# Patient Record
Sex: Female | Born: 1960
Health system: Southern US, Community
[De-identification: ages and names within clinical notes are randomized; demographics above are authoritative.]

## PROBLEM LIST (undated history)

## (undated) DIAGNOSIS — I10 Essential (primary) hypertension: Secondary | ICD-10-CM

## (undated) DIAGNOSIS — E785 Hyperlipidemia, unspecified: Secondary | ICD-10-CM

## (undated) DIAGNOSIS — F329 Major depressive disorder, single episode, unspecified: Secondary | ICD-10-CM

## (undated) DIAGNOSIS — E559 Vitamin D deficiency, unspecified: Secondary | ICD-10-CM

## (undated) DIAGNOSIS — T7840XA Allergy, unspecified, initial encounter: Secondary | ICD-10-CM

## (undated) DIAGNOSIS — R600 Localized edema: Secondary | ICD-10-CM

## (undated) DIAGNOSIS — K219 Gastro-esophageal reflux disease without esophagitis: Secondary | ICD-10-CM

## (undated) DIAGNOSIS — E739 Lactose intolerance, unspecified: Secondary | ICD-10-CM

## (undated) DIAGNOSIS — E538 Deficiency of other specified B group vitamins: Secondary | ICD-10-CM

## (undated) DIAGNOSIS — M1990 Unspecified osteoarthritis, unspecified site: Secondary | ICD-10-CM

## (undated) DIAGNOSIS — E669 Obesity, unspecified: Secondary | ICD-10-CM

## (undated) DIAGNOSIS — M2550 Pain in unspecified joint: Secondary | ICD-10-CM

## (undated) DIAGNOSIS — F319 Bipolar disorder, unspecified: Secondary | ICD-10-CM

## (undated) DIAGNOSIS — F32A Depression, unspecified: Secondary | ICD-10-CM

## (undated) DIAGNOSIS — L732 Hidradenitis suppurativa: Secondary | ICD-10-CM

## (undated) DIAGNOSIS — F419 Anxiety disorder, unspecified: Secondary | ICD-10-CM

## (undated) DIAGNOSIS — R0602 Shortness of breath: Secondary | ICD-10-CM

## (undated) HISTORY — DX: Pain in unspecified joint: M25.50

## (undated) HISTORY — DX: Anxiety disorder, unspecified: F41.9

## (undated) HISTORY — DX: Major depressive disorder, single episode, unspecified: F32.9

## (undated) HISTORY — DX: Shortness of breath: R06.02

## (undated) HISTORY — DX: Lactose intolerance, unspecified: E73.9

## (undated) HISTORY — PX: BREAST BIOPSY: SHX20

## (undated) HISTORY — DX: Depression, unspecified: F32.A

## (undated) HISTORY — DX: Unspecified osteoarthritis, unspecified site: M19.90

## (undated) HISTORY — DX: Localized edema: R60.0

## (undated) HISTORY — DX: Obesity, unspecified: E66.9

## (undated) HISTORY — DX: Gastro-esophageal reflux disease without esophagitis: K21.9

## (undated) HISTORY — DX: Hyperlipidemia, unspecified: E78.5

## (undated) HISTORY — PX: WISDOM TOOTH EXTRACTION: SHX21

## (undated) HISTORY — DX: Allergy, unspecified, initial encounter: T78.40XA

## (undated) HISTORY — DX: Hidradenitis suppurativa: L73.2

## (undated) HISTORY — DX: Deficiency of other specified B group vitamins: E53.8

## (undated) HISTORY — PX: INDUCED ABORTION: SHX677

## (undated) HISTORY — DX: Vitamin D deficiency, unspecified: E55.9

---

## 1997-10-16 ENCOUNTER — Ambulatory Visit (HOSPITAL_COMMUNITY): Admission: RE | Admit: 1997-10-16 | Discharge: 1997-10-16 | Payer: Self-pay | Admitting: Family Medicine

## 1998-09-02 ENCOUNTER — Other Ambulatory Visit: Admission: RE | Admit: 1998-09-02 | Discharge: 1998-09-02 | Payer: Self-pay | Admitting: Family Medicine

## 2000-01-26 ENCOUNTER — Other Ambulatory Visit: Admission: RE | Admit: 2000-01-26 | Discharge: 2000-01-26 | Payer: Self-pay | Admitting: Obstetrics and Gynecology

## 2000-09-14 ENCOUNTER — Emergency Department (HOSPITAL_COMMUNITY): Admission: EM | Admit: 2000-09-14 | Discharge: 2000-09-14 | Payer: Self-pay | Admitting: Emergency Medicine

## 2000-09-23 ENCOUNTER — Emergency Department (HOSPITAL_COMMUNITY): Admission: EM | Admit: 2000-09-23 | Discharge: 2000-09-23 | Payer: Self-pay | Admitting: Emergency Medicine

## 2000-09-29 ENCOUNTER — Emergency Department (HOSPITAL_COMMUNITY): Admission: EM | Admit: 2000-09-29 | Discharge: 2000-09-29 | Payer: Self-pay | Admitting: Emergency Medicine

## 2002-08-26 ENCOUNTER — Other Ambulatory Visit: Admission: RE | Admit: 2002-08-26 | Discharge: 2002-08-26 | Payer: Self-pay | Admitting: Obstetrics & Gynecology

## 2003-01-27 ENCOUNTER — Encounter: Payer: Self-pay | Admitting: Obstetrics and Gynecology

## 2003-01-27 ENCOUNTER — Encounter: Admission: RE | Admit: 2003-01-27 | Discharge: 2003-01-27 | Payer: Self-pay | Admitting: Obstetrics and Gynecology

## 2003-02-11 ENCOUNTER — Encounter: Payer: Self-pay | Admitting: Obstetrics and Gynecology

## 2003-02-11 ENCOUNTER — Encounter: Admission: RE | Admit: 2003-02-11 | Discharge: 2003-02-11 | Payer: Self-pay | Admitting: Obstetrics and Gynecology

## 2004-06-24 ENCOUNTER — Emergency Department (HOSPITAL_COMMUNITY): Admission: EM | Admit: 2004-06-24 | Discharge: 2004-06-24 | Payer: Self-pay | Admitting: Emergency Medicine

## 2007-01-29 LAB — CONVERTED CEMR LAB

## 2007-02-28 LAB — CONVERTED CEMR LAB

## 2007-04-11 ENCOUNTER — Telehealth (INDEPENDENT_AMBULATORY_CARE_PROVIDER_SITE_OTHER): Payer: Self-pay | Admitting: *Deleted

## 2007-04-17 ENCOUNTER — Ambulatory Visit: Payer: Self-pay | Admitting: Nurse Practitioner

## 2007-04-17 DIAGNOSIS — I10 Essential (primary) hypertension: Secondary | ICD-10-CM | POA: Insufficient documentation

## 2007-04-17 DIAGNOSIS — E669 Obesity, unspecified: Secondary | ICD-10-CM | POA: Insufficient documentation

## 2007-04-17 DIAGNOSIS — F319 Bipolar disorder, unspecified: Secondary | ICD-10-CM | POA: Insufficient documentation

## 2007-04-17 LAB — CONVERTED CEMR LAB
ALT: 18 units/L (ref 0–35)
AST: 18 units/L (ref 0–37)
Albumin: 4.1 g/dL (ref 3.5–5.2)
Alkaline Phosphatase: 52 units/L (ref 39–117)
BUN: 11 mg/dL (ref 6–23)
Basophils Absolute: 0 10*3/uL (ref 0.0–0.1)
Basophils Relative: 0 % (ref 0–1)
Bilirubin Urine: NEGATIVE
Blood in Urine, dipstick: NEGATIVE
CO2: 27 meq/L (ref 19–32)
Calcium: 9.4 mg/dL (ref 8.4–10.5)
Carbamazepine Lvl: 5.5 ug/mL (ref 4.0–12.0)
Chloride: 103 meq/L (ref 96–112)
Cholesterol: 207 mg/dL — ABNORMAL HIGH (ref 0–200)
Creatinine, Ser: 0.83 mg/dL (ref 0.40–1.20)
Eosinophils Absolute: 0 10*3/uL — ABNORMAL LOW (ref 0.2–0.7)
Eosinophils Relative: 1 % (ref 0–5)
Glucose, Bld: 87 mg/dL (ref 70–99)
Glucose, Urine, Semiquant: NEGATIVE
HCT: 41.7 % (ref 36.0–46.0)
HDL: 71 mg/dL (ref >39)
Hemoglobin: 13.4 g/dL (ref 12.0–15.0)
Ketones, urine, test strip: NEGATIVE
LDL Cholesterol: 107 mg/dL — ABNORMAL HIGH (ref 0–99)
Lymphocytes Relative: 37 % (ref 12–46)
Lymphs Abs: 1.7 10*3/uL (ref 0.7–4.0)
MCHC: 32.1 g/dL (ref 30.0–36.0)
MCV: 95.9 fL (ref 78.0–100.0)
Monocytes Absolute: 0.6 10*3/uL (ref 0.1–1.0)
Monocytes Relative: 14 % — ABNORMAL HIGH (ref 3–12)
Neutro Abs: 2.2 10*3/uL (ref 1.7–7.7)
Neutrophils Relative %: 48 % (ref 43–77)
Nitrite: NEGATIVE
Platelets: 323 10*3/uL (ref 150–400)
Potassium: 4.4 meq/L (ref 3.5–5.3)
RBC: 4.35 M/uL (ref 3.87–5.11)
RDW: 13.4 % (ref 11.5–15.5)
Sodium: 141 meq/L (ref 135–145)
Specific Gravity, Urine: <1.005
TSH: 0.625 microintl units/mL (ref 0.350–5.50)
Total Bilirubin: 0.3 mg/dL (ref 0.3–1.2)
Total CHOL/HDL Ratio: 2.9
Total Protein: 7.7 g/dL (ref 6.0–8.3)
Triglycerides: 143 mg/dL (ref <150)
Urobilinogen, UA: 0.2
VLDL: 29 mg/dL (ref 0–40)
WBC Urine, dipstick: NEGATIVE
WBC: 4.5 10*3/uL (ref 4.0–10.5)
pH: 8

## 2007-04-18 ENCOUNTER — Encounter (INDEPENDENT_AMBULATORY_CARE_PROVIDER_SITE_OTHER): Payer: Self-pay | Admitting: Nurse Practitioner

## 2007-05-03 ENCOUNTER — Ambulatory Visit: Payer: Self-pay | Admitting: Internal Medicine

## 2007-05-16 ENCOUNTER — Ambulatory Visit (HOSPITAL_COMMUNITY): Admission: RE | Admit: 2007-05-16 | Discharge: 2007-05-16 | Payer: Self-pay | Admitting: Family Medicine

## 2007-05-21 ENCOUNTER — Ambulatory Visit: Payer: Self-pay | Admitting: Nurse Practitioner

## 2007-05-21 LAB — CONVERTED CEMR LAB: Carbamazepine Lvl: 5.4 ug/mL (ref 4.0–12.0)

## 2007-05-22 ENCOUNTER — Encounter (INDEPENDENT_AMBULATORY_CARE_PROVIDER_SITE_OTHER): Payer: Self-pay | Admitting: Nurse Practitioner

## 2007-06-07 ENCOUNTER — Encounter (INDEPENDENT_AMBULATORY_CARE_PROVIDER_SITE_OTHER): Payer: Self-pay | Admitting: Nurse Practitioner

## 2007-07-26 ENCOUNTER — Telehealth (INDEPENDENT_AMBULATORY_CARE_PROVIDER_SITE_OTHER): Payer: Self-pay | Admitting: Nurse Practitioner

## 2007-09-04 ENCOUNTER — Telehealth (INDEPENDENT_AMBULATORY_CARE_PROVIDER_SITE_OTHER): Payer: Self-pay | Admitting: Nurse Practitioner

## 2007-09-06 ENCOUNTER — Ambulatory Visit: Payer: Self-pay | Admitting: Internal Medicine

## 2007-09-06 ENCOUNTER — Ambulatory Visit: Payer: Self-pay | Admitting: *Deleted

## 2007-09-06 DIAGNOSIS — L732 Hidradenitis suppurativa: Secondary | ICD-10-CM | POA: Insufficient documentation

## 2007-09-21 ENCOUNTER — Telehealth (INDEPENDENT_AMBULATORY_CARE_PROVIDER_SITE_OTHER): Payer: Self-pay | Admitting: Nurse Practitioner

## 2007-09-24 ENCOUNTER — Ambulatory Visit: Payer: Self-pay | Admitting: Nurse Practitioner

## 2007-09-24 DIAGNOSIS — B379 Candidiasis, unspecified: Secondary | ICD-10-CM | POA: Insufficient documentation

## 2007-10-05 ENCOUNTER — Telehealth (INDEPENDENT_AMBULATORY_CARE_PROVIDER_SITE_OTHER): Payer: Self-pay | Admitting: Nurse Practitioner

## 2007-10-23 ENCOUNTER — Telehealth (INDEPENDENT_AMBULATORY_CARE_PROVIDER_SITE_OTHER): Payer: Self-pay | Admitting: Nurse Practitioner

## 2007-10-29 ENCOUNTER — Ambulatory Visit: Payer: Self-pay | Admitting: Nurse Practitioner

## 2007-11-12 ENCOUNTER — Telehealth (INDEPENDENT_AMBULATORY_CARE_PROVIDER_SITE_OTHER): Payer: Self-pay | Admitting: *Deleted

## 2007-12-19 ENCOUNTER — Ambulatory Visit: Payer: Self-pay | Admitting: Nurse Practitioner

## 2007-12-21 ENCOUNTER — Encounter (INDEPENDENT_AMBULATORY_CARE_PROVIDER_SITE_OTHER): Payer: Self-pay | Admitting: Nurse Practitioner

## 2007-12-21 LAB — CONVERTED CEMR LAB: Pap Smear: NEGATIVE

## 2007-12-28 ENCOUNTER — Encounter (INDEPENDENT_AMBULATORY_CARE_PROVIDER_SITE_OTHER): Payer: Self-pay | Admitting: Nurse Practitioner

## 2008-01-11 ENCOUNTER — Encounter (INDEPENDENT_AMBULATORY_CARE_PROVIDER_SITE_OTHER): Payer: Self-pay | Admitting: Nurse Practitioner

## 2008-01-29 ENCOUNTER — Ambulatory Visit: Payer: Self-pay | Admitting: Internal Medicine

## 2008-01-29 ENCOUNTER — Encounter (INDEPENDENT_AMBULATORY_CARE_PROVIDER_SITE_OTHER): Payer: Self-pay | Admitting: Nurse Practitioner

## 2008-02-11 ENCOUNTER — Encounter (INDEPENDENT_AMBULATORY_CARE_PROVIDER_SITE_OTHER): Payer: Self-pay | Admitting: Nurse Practitioner

## 2008-03-17 ENCOUNTER — Telehealth (INDEPENDENT_AMBULATORY_CARE_PROVIDER_SITE_OTHER): Payer: Self-pay | Admitting: Nurse Practitioner

## 2008-03-24 ENCOUNTER — Ambulatory Visit: Payer: Self-pay | Admitting: Nurse Practitioner

## 2008-03-27 ENCOUNTER — Ambulatory Visit: Payer: Self-pay | Admitting: Nurse Practitioner

## 2008-06-06 ENCOUNTER — Ambulatory Visit: Payer: Self-pay | Admitting: Nurse Practitioner

## 2008-06-20 ENCOUNTER — Ambulatory Visit: Payer: Self-pay | Admitting: Nurse Practitioner

## 2008-10-30 ENCOUNTER — Ambulatory Visit: Payer: Self-pay | Admitting: Nurse Practitioner

## 2008-10-30 DIAGNOSIS — N898 Other specified noninflammatory disorders of vagina: Secondary | ICD-10-CM | POA: Insufficient documentation

## 2008-10-30 LAB — CONVERTED CEMR LAB
Blood in Urine, dipstick: NEGATIVE
Glucose, Urine, Semiquant: NEGATIVE
KOH Prep: NEGATIVE
Nitrite: NEGATIVE
Protein, U semiquant: 30
Specific Gravity, Urine: >=1.03
Urobilinogen, UA: 0.2
WBC Urine, dipstick: NEGATIVE
pH: 5

## 2009-04-27 ENCOUNTER — Ambulatory Visit: Payer: Self-pay | Admitting: Nurse Practitioner

## 2009-04-27 LAB — CONVERTED CEMR LAB
Bilirubin Urine: NEGATIVE
Glucose, Urine, Semiquant: NEGATIVE
KOH Prep: NEGATIVE
Ketones, urine, test strip: NEGATIVE
Nitrite: NEGATIVE
OCCULT 1: NEGATIVE
Protein, U semiquant: NEGATIVE
Specific Gravity, Urine: 1.01
Urobilinogen, UA: NEGATIVE
WBC Urine, dipstick: NEGATIVE
pH: 6.5

## 2009-04-28 ENCOUNTER — Encounter (INDEPENDENT_AMBULATORY_CARE_PROVIDER_SITE_OTHER): Payer: Self-pay | Admitting: Nurse Practitioner

## 2009-04-28 DIAGNOSIS — E7800 Pure hypercholesterolemia, unspecified: Secondary | ICD-10-CM | POA: Insufficient documentation

## 2009-04-28 LAB — CONVERTED CEMR LAB
ALT: 16 units/L (ref 0–35)
AST: 16 units/L (ref 0–37)
Albumin: 4.4 g/dL (ref 3.5–5.2)
Alkaline Phosphatase: 54 units/L (ref 39–117)
BUN: 11 mg/dL (ref 6–23)
Basophils Absolute: 0 10*3/uL (ref 0.0–0.1)
Basophils Relative: 1 % (ref 0–1)
CO2: 24 meq/L (ref 19–32)
Calcium: 9.4 mg/dL (ref 8.4–10.5)
Chlamydia, DNA Probe: NEGATIVE
Chloride: 100 meq/L (ref 96–112)
Cholesterol: 222 mg/dL — ABNORMAL HIGH (ref 0–200)
Creatinine, Ser: 0.81 mg/dL (ref 0.40–1.20)
Eosinophils Absolute: 0.1 10*3/uL (ref 0.0–0.7)
Eosinophils Relative: 2 % (ref 0–5)
GC Probe Amp, Genital: NEGATIVE
Glucose, Bld: 78 mg/dL (ref 70–99)
HCT: 42.2 % (ref 36.0–46.0)
HDL: 69 mg/dL (ref >39)
Hemoglobin: 13.8 g/dL (ref 12.0–15.0)
LDL Cholesterol: 134 mg/dL — ABNORMAL HIGH (ref 0–99)
Lymphocytes Relative: 39 % (ref 12–46)
Lymphs Abs: 2.1 10*3/uL (ref 0.7–4.0)
MCHC: 32.7 g/dL (ref 30.0–36.0)
MCV: 94.8 fL (ref 78.0–100.0)
Microalb, Ur: 0.92 mg/dL (ref 0.00–1.89)
Monocytes Absolute: 0.7 10*3/uL (ref 0.1–1.0)
Monocytes Relative: 13 % — ABNORMAL HIGH (ref 3–12)
Neutro Abs: 2.4 10*3/uL (ref 1.7–7.7)
Neutrophils Relative %: 45 % (ref 43–77)
Platelets: 348 10*3/uL (ref 150–400)
Potassium: 4.8 meq/L (ref 3.5–5.3)
RBC: 4.45 M/uL (ref 3.87–5.11)
RDW: 13.3 % (ref 11.5–15.5)
Sodium: 137 meq/L (ref 135–145)
TSH: 0.583 microintl units/mL (ref 0.350–4.500)
Total Bilirubin: 0.3 mg/dL (ref 0.3–1.2)
Total CHOL/HDL Ratio: 3.2
Total Protein: 7.9 g/dL (ref 6.0–8.3)
Triglycerides: 94 mg/dL (ref <150)
VLDL: 19 mg/dL (ref 0–40)
WBC: 5.3 10*3/uL (ref 4.0–10.5)

## 2009-05-07 ENCOUNTER — Ambulatory Visit (HOSPITAL_COMMUNITY): Admission: RE | Admit: 2009-05-07 | Discharge: 2009-05-07 | Payer: Self-pay | Admitting: Internal Medicine

## 2009-05-07 ENCOUNTER — Encounter (INDEPENDENT_AMBULATORY_CARE_PROVIDER_SITE_OTHER): Payer: Self-pay | Admitting: Nurse Practitioner

## 2009-05-11 ENCOUNTER — Encounter (INDEPENDENT_AMBULATORY_CARE_PROVIDER_SITE_OTHER): Payer: Self-pay | Admitting: Nurse Practitioner

## 2009-05-18 ENCOUNTER — Encounter: Admission: RE | Admit: 2009-05-18 | Discharge: 2009-05-18 | Payer: Self-pay | Admitting: Internal Medicine

## 2009-05-18 ENCOUNTER — Encounter (INDEPENDENT_AMBULATORY_CARE_PROVIDER_SITE_OTHER): Payer: Self-pay | Admitting: Nurse Practitioner

## 2009-06-24 ENCOUNTER — Ambulatory Visit: Payer: Self-pay | Admitting: Nurse Practitioner

## 2009-06-24 DIAGNOSIS — R928 Other abnormal and inconclusive findings on diagnostic imaging of breast: Secondary | ICD-10-CM | POA: Insufficient documentation

## 2009-06-24 LAB — CONVERTED CEMR LAB
Cholesterol, target level: 200 mg/dL
HDL goal, serum: 40 mg/dL
LDL Goal: 160 mg/dL

## 2009-12-08 ENCOUNTER — Ambulatory Visit: Payer: Self-pay | Admitting: Nurse Practitioner

## 2009-12-10 ENCOUNTER — Ambulatory Visit: Payer: Self-pay | Admitting: Internal Medicine

## 2009-12-24 ENCOUNTER — Ambulatory Visit: Payer: Self-pay | Admitting: Nurse Practitioner

## 2009-12-24 DIAGNOSIS — R21 Rash and other nonspecific skin eruption: Secondary | ICD-10-CM | POA: Insufficient documentation

## 2009-12-25 ENCOUNTER — Encounter (INDEPENDENT_AMBULATORY_CARE_PROVIDER_SITE_OTHER): Payer: Self-pay | Admitting: Nurse Practitioner

## 2009-12-30 ENCOUNTER — Encounter (INDEPENDENT_AMBULATORY_CARE_PROVIDER_SITE_OTHER): Payer: Self-pay | Admitting: Nurse Practitioner

## 2010-01-04 LAB — CONVERTED CEMR LAB
Chlamydia, Swab/Urine, PCR: NEGATIVE
GC Probe Amp, Urine: NEGATIVE

## 2010-04-29 ENCOUNTER — Ambulatory Visit: Payer: Self-pay | Admitting: Nurse Practitioner

## 2010-04-29 DIAGNOSIS — M25569 Pain in unspecified knee: Secondary | ICD-10-CM | POA: Insufficient documentation

## 2010-04-29 LAB — CONVERTED CEMR LAB
ALT: 12 units/L (ref 0–35)
AST: 16 units/L (ref 0–37)
Albumin: 3.9 g/dL (ref 3.5–5.2)
Alkaline Phosphatase: 45 units/L (ref 39–117)
BUN: 11 mg/dL (ref 6–23)
Basophils Absolute: 0 10*3/uL (ref 0.0–0.1)
Basophils Relative: 1 % (ref 0–1)
CO2: 27 meq/L (ref 19–32)
Calcium: 9.3 mg/dL (ref 8.4–10.5)
Chloride: 103 meq/L (ref 96–112)
Cholesterol: 203 mg/dL — ABNORMAL HIGH (ref 0–200)
Creatinine, Ser: 0.72 mg/dL (ref 0.40–1.20)
Eosinophils Absolute: 0.1 10*3/uL (ref 0.0–0.7)
Eosinophils Relative: 2 % (ref 0–5)
Glucose, Bld: 81 mg/dL (ref 70–99)
HCT: 38 % (ref 36.0–46.0)
HDL: 65 mg/dL (ref >39)
Hemoglobin: 12.7 g/dL (ref 12.0–15.0)
LDL Cholesterol: 125 mg/dL — ABNORMAL HIGH (ref 0–99)
Lymphocytes Relative: 46 % (ref 12–46)
Lymphs Abs: 2 10*3/uL (ref 0.7–4.0)
MCHC: 33.4 g/dL (ref 30.0–36.0)
MCV: 92.9 fL (ref 78.0–100.0)
Monocytes Absolute: 0.5 10*3/uL (ref 0.1–1.0)
Monocytes Relative: 12 % (ref 3–12)
Neutro Abs: 1.7 10*3/uL (ref 1.7–7.7)
Neutrophils Relative %: 40 % — ABNORMAL LOW (ref 43–77)
Platelets: 319 10*3/uL (ref 150–400)
Potassium: 4.7 meq/L (ref 3.5–5.3)
RBC: 4.09 M/uL (ref 3.87–5.11)
RDW: 13 % (ref 11.5–15.5)
Rapid HIV Screen: NEGATIVE
Sodium: 138 meq/L (ref 135–145)
TSH: 0.761 microintl units/mL (ref 0.350–4.500)
Total Bilirubin: 0.4 mg/dL (ref 0.3–1.2)
Total CHOL/HDL Ratio: 3.1
Total Protein: 7.5 g/dL (ref 6.0–8.3)
Triglycerides: 65 mg/dL (ref <150)
VLDL: 13 mg/dL (ref 0–40)
WBC: 4.3 10*3/uL (ref 4.0–10.5)

## 2010-04-30 ENCOUNTER — Encounter (INDEPENDENT_AMBULATORY_CARE_PROVIDER_SITE_OTHER): Payer: Self-pay | Admitting: Nurse Practitioner

## 2010-05-03 ENCOUNTER — Encounter (INDEPENDENT_AMBULATORY_CARE_PROVIDER_SITE_OTHER): Payer: Self-pay | Admitting: Nurse Practitioner

## 2010-05-07 ENCOUNTER — Encounter (INDEPENDENT_AMBULATORY_CARE_PROVIDER_SITE_OTHER): Payer: Self-pay | Admitting: Nurse Practitioner

## 2010-05-19 ENCOUNTER — Ambulatory Visit (HOSPITAL_COMMUNITY)
Admission: RE | Admit: 2010-05-19 | Discharge: 2010-05-19 | Payer: Self-pay | Source: Home / Self Care | Attending: Internal Medicine | Admitting: Internal Medicine

## 2010-05-27 ENCOUNTER — Encounter (INDEPENDENT_AMBULATORY_CARE_PROVIDER_SITE_OTHER): Payer: Self-pay | Admitting: Nurse Practitioner

## 2010-05-30 HISTORY — PX: BREAST BIOPSY: SHX20

## 2010-06-10 ENCOUNTER — Encounter
Admission: RE | Admit: 2010-06-10 | Discharge: 2010-06-10 | Payer: Self-pay | Source: Home / Self Care | Attending: Internal Medicine | Admitting: Internal Medicine

## 2010-06-17 ENCOUNTER — Encounter (INDEPENDENT_AMBULATORY_CARE_PROVIDER_SITE_OTHER): Payer: Self-pay | Admitting: Nurse Practitioner

## 2010-06-17 ENCOUNTER — Other Ambulatory Visit: Payer: Self-pay | Admitting: Diagnostic Radiology

## 2010-06-17 ENCOUNTER — Encounter
Admission: RE | Admit: 2010-06-17 | Discharge: 2010-06-17 | Payer: Self-pay | Source: Home / Self Care | Attending: Internal Medicine | Admitting: Internal Medicine

## 2010-06-24 ENCOUNTER — Ambulatory Visit
Admission: RE | Admit: 2010-06-24 | Discharge: 2010-06-24 | Payer: Self-pay | Source: Home / Self Care | Attending: Nurse Practitioner | Admitting: Nurse Practitioner

## 2010-06-24 ENCOUNTER — Encounter (INDEPENDENT_AMBULATORY_CARE_PROVIDER_SITE_OTHER): Payer: Self-pay | Admitting: Nurse Practitioner

## 2010-06-24 ENCOUNTER — Other Ambulatory Visit: Payer: Self-pay | Admitting: Nurse Practitioner

## 2010-06-24 LAB — CYTOLOGY - PAP

## 2010-06-24 LAB — CONVERTED CEMR LAB
Bilirubin Urine: NEGATIVE
Glucose, Urine, Semiquant: NEGATIVE
Ketones, urine, test strip: NEGATIVE
Nitrite: NEGATIVE
OCCULT 1: NEGATIVE
Protein, U semiquant: NEGATIVE
Specific Gravity, Urine: >=1.03
Urobilinogen, UA: 0.2
WBC Urine, dipstick: NEGATIVE
pH: 5

## 2010-06-27 LAB — CONVERTED CEMR LAB
Bilirubin Urine: NEGATIVE
Blood in Urine, dipstick: NEGATIVE
Chlamydia, DNA Probe: NEGATIVE
GC Probe Amp, Genital: NEGATIVE
Glucose, Urine, Semiquant: NEGATIVE
KOH Prep: NEGATIVE
Ketones, urine, test strip: NEGATIVE
Microalb, Ur: 1.01 mg/dL (ref 0.00–1.89)
Nitrite: NEGATIVE
Protein, U semiquant: NEGATIVE
Specific Gravity, Urine: >=1.03
Urobilinogen, UA: 0.2
WBC Urine, dipstick: NEGATIVE
pH: 5

## 2010-06-28 ENCOUNTER — Telehealth (INDEPENDENT_AMBULATORY_CARE_PROVIDER_SITE_OTHER): Payer: Self-pay | Admitting: Nurse Practitioner

## 2010-06-29 NOTE — Progress Notes (Signed)
Summary: Office Visit/EMR DOWN/ VISIT SCANNED  Office Visit/EMR DOWN/ VISIT SCANNED   Imported By: Arta Bruce 09/11/2007 10:43:19  _____________________________________________________________________  External Attachment:    Type:   Image     Comment:   External Document

## 2010-06-29 NOTE — Letter (Signed)
Summary: TEST ORDER FORM//MAMMOGRAM//APPT DATE & TIME  TEST ORDER FORM//MAMMOGRAM//APPT DATE & TIME   Imported By: Arta Bruce 06/17/2009 15:37:06  _____________________________________________________________________  External Attachment:    Type:   Image     Comment:   External Document

## 2010-06-29 NOTE — Letter (Signed)
Summary: *HSN Results Follow up  [facility]  [address] [place], [place] [address]   Phone: [phone]  Fax: [phone]      05/22/2007   SATYA BOHALL [address] [place], [place]  [address]   Dear  Ms. Lowell Bouton,                            ____S.Drinkard,FNP   ____D. Gore,FNP       ____B. McPherson,MD   ____V. Rankins,MD    ____E. Mulberry,MD    _X__N. Daphine Deutscher, FNP  ____D. Reche Dixon, MD    ____K. Philipp Deputy, MD    ____Other     This letter is to inform you that your recent test(s):  _______Pap Smear    ___X____Lab Test     _______X-ray    __X_____ is within acceptable limits  _______ requires a medication change  _______ requires a follow-up lab visit  _______ requires a follow-up visit with your provider   Comments: Tegretol level is ok.  Continue your medications.       _________________________________________________________ If you have any questions, please contact our office                     Sincerely,  Lehman Prom FNP [facility]

## 2010-06-29 NOTE — Progress Notes (Signed)
Summary: NEEDS REFILLS  Phone Note Call from Patient Call back at Home Phone [phone]   Reason for Call: Refill Medication Summary of Call: Stacie Daugherty PT. WENT TO [facility] LAST WEEK TO TRY AND GET A REFILL ON HER MINOCYCLINE FOR THE BOILS UNDER HER ARM AND THEY TOLD HER TO CALL HERE, PLUS SHE IS NEEDING THE BACTRIM REFIILED. Initial call taken by: Leodis Rains,  Oct 23, 2007 4:31 PM  Follow-up for Phone Call        forwarded to [facility] Follow-up by: Levon Hedger,  Oct 25, 2007 12:52 PM  Additional Follow-up for Phone Call Additional follow up Details #1::        There are not refills available.  Pt stated that minocycline was not working.  Therefore she was changed to bactrim which was for a 10 day course.  If problem still persists then she needs re-evaluation. She needs to use antibacterial soap and she can also get cleanser such as betasept that is OTC at the pharmacy to prevent buildup of bacteria Additional Follow-up by: Lehman Prom FNP,  Oct 25, 2007 1:33 PM    Additional Follow-up for Phone Call Additional follow up Details #2::    left message on machine for pt to return call to the office. Follow-up by: Levon Hedger,  Oct 26, 2007 12:45 PM  Additional Follow-up for Phone Call Additional follow up Details #3:: Details for Additional Follow-up Action Taken: Another request from [facility] pharmacy on the 29th for Minocycline--please notify pharmacy on Monday of [name]'s recommendations.    Levon Hedger  October 29, 2007 9:24 AM Pt is in for an office visit today.  Phone note complete. Additional Follow-up by: Julieanne Manson MD,  Oct 28, 2007 3:05 PM

## 2010-06-29 NOTE — Progress Notes (Signed)
Summary: FORM FROM SCHOOL DROPPED OFF  Phone Note Call from Patient Call back at [facility] Phone [phone]   Summary of Call: [employer] PT. 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 Initial call taken by: Leodis Rains,  March 17, 2008 4:40 PM  Follow-up for Phone Call        I have reviewed form... she needs  nurse visit for hearing, vision and PPD. Her tetanus is up to date but has she ever recieved HEP B? Follow-up by: Lehman Prom FNP,  March 17, 2008 5:36 PM  Additional Follow-up for Phone Call Additional follow up Details #1::        Patient came in this morning and scheduled an appt.   Additional Follow-up by: Leodis Rains,  March 24, 2008 8:16 AM

## 2010-06-29 NOTE — Letter (Signed)
Summary: AMANDA/NO SHOWED  AMANDA/NO SHOWED   Imported By: Arta Bruce 08/06/2008 11:34:40  _____________________________________________________________________  External Attachment:    Type:   Image     Comment:   External Document

## 2010-06-29 NOTE — Progress Notes (Signed)
Summary: Diazide refill  Phone Note From Pharmacy   Caller: [facility] [address] Summary of Call: Refill Diazide capsules last filled 08/22/07  [facility] [phone] Initial call taken by: Vesta Mixer CMA,  September 21, 2007 3:05 PM  Follow-up for Phone Call        done. Follow-up by: Lehman Prom FNP,  September 21, 2007 5:00 PM      Prescriptions: TRIAMTERENE-HCTZ 37.5-25 MG  CAPS (TRIAMTERENE-HCTZ) 1 tablet by mouth daily for blood pressure  #30 x 3   Entered and Authorized by:   Lehman Prom FNP   Signed by:   Lehman Prom FNP on 09/21/2007   Method used:   Electronically sent to ...       [facility]       [address]       [place], [place]  [address]       Ph: [phone]       Fax: [phone]   RxID:   [id number]

## 2010-06-29 NOTE — Progress Notes (Signed)
Summary: med request  Phone Note Call from Patient   Caller: Patient Reason for Call: Refill Medication Summary of Call: Pt is requesting MINOCYCLINE 100 MG Twice X Day  . [facility])   Please, call her back   [phone] Initial call taken by: Cheryll Dessert,  September 04, 2007 3:42 PM  Follow-up for Phone Call        Why does she need this med? this is an antibiotic and she has never been prescribed from this office.   If she feels she needs to be treated for infection then she needs to come to the office Follow-up by: Lehman Prom FNP,  September 05, 2007 8:13 AM  Additional Follow-up for Phone Call Additional follow up Details #1::        Left message on machine for pt to return call to the office Additional Follow-up by: Levon Hedger,  September 06, 2007 2:55 PM    Additional Follow-up for Phone Call Additional follow up Details #2::    Spoke with pt she states she came in last week and received a pill for the infection and she also states she has an appt on next week.  Phone call complete Follow-up by: Levon Hedger,  September 12, 2007 8:54 AM  \

## 2010-06-29 NOTE — Progress Notes (Signed)
Summary: Office Visit//DEPRESSION SCREENING  Office Visit//DEPRESSION SCREENING   Imported By: Arta Bruce 04/29/2010 12:28:31  _____________________________________________________________________  External Attachment:    Type:   Image     Comment:   External Document

## 2010-06-29 NOTE — Assessment & Plan Note (Signed)
Summary: NEW - HTN/Bipolar   Vital Signs:  Patient Profile:   50 Years Old Female Weight:      235 pounds Temp:     97.3 degrees F oral Pulse rate:   80 / minute Pulse rhythm:   regular Resp:     20 per minute BP sitting:   130 / 90  (right arm)  Pt. in pain?   no  Vitals Entered By: Mikey College CMA (April 17, 2007 8:44 AM)  Menstrual History: LMP - Character: 04/15/07              Is Patient Diabetic? No  Does patient need assistance? Functional Status Self care Ambulation Normal Comments Pt states [facility]. [facility] (need refills)     Chief Complaint:  New pt/establish care//pt states would like referral to see Marchelle Folks the counselor..  History of Present Illness: Pt into the office to establish care. Pt was previously seen at [facility].  CPE done within last 2 months at [facility] clinic.  She notes that not enough cells were collected during her PAP and she was to repeat in 6 months. Mammogram scheduled in 12/08.  she recieved a scholarship from radiology.  htn - pt is taking blood pressure meds daily.  She does not monitor the sodium in her diet.  She has some questions about her "fluid" pills and sodium use.  no exercise.  No glasses or contacts.  no recent optho visit.  Last dental exam 6 months ago.  Bipolar - Pt notes that she has been on the tegretol for years.  She is requesting a refill on her meds.  She would like to go talk to Aquilla Solian at the [facility] site for further direction on whether she needs to go to the [facility].  Pt notes that her condition is stable.        Current Allergies: No known allergies   Past Medical History:    Current Problems:     OBESITY (ICD-278.00)    BIPOLAR DISORDER UNSPECIFIED (ICD-296.80)    HYPERTENSION (ICD-401.9)      Past Surgical History:    Tonsillectomy   Family History:    Family History Depression - mother       Social History:    Married    children - 1    Never Smoked    Alcohol use-no    Drug use-no   Risk Factors:  Tobacco use:  never Drug use:  no Alcohol use:  no  PAP Smear History:     Date of Last PAP Smear:  01/29/2007    Results:  need repeat in 6 months; [facility] hospital    Review of Systems  General      Denies chills, fatigue, and fever.  Eyes      Denies blurring, discharge, double vision, eye irritation, eye pain, halos, itching, light sensitivity, red eye, vision loss-1 eye, and vision loss-both eyes.  ENT      Denies decreased hearing, difficulty swallowing, ear discharge, earache, hoarseness, nasal congestion, nosebleeds, postnasal drainage, ringing in ears, sinus pressure, and sore throat.  CV      Denies chest pain or discomfort, fatigue, and shortness of breath with exertion.  Resp      Denies chest discomfort, cough, and shortness of breath.  GI      Denies abdominal pain, diarrhea, nausea, and vomiting.  GU      Denies abnormal vaginal bleeding, decreased  libido, discharge, dysuria, genital sores, hematuria, incontinence, nocturia, urinary frequency, and urinary hesitancy.  MS      Denies joint pain, joint redness, joint swelling, loss of strength, low back pain, mid back pain, muscle aches, muscle , cramps, muscle weakness, stiffness, and thoracic pain.  Derm      Denies changes in color of skin, changes in nail beds, dryness, excessive perspiration, flushing, hair loss, insect bite(s), itching, lesion(s), poor wound healing, and rash.  Neuro      Denies brief paralysis, difficulty with concentration, disturbances in coordination, falling down, headaches, inability to speak, memory loss, numbness, poor balance, seizures, sensation of room spinning, tingling, tremors, visual disturbances, and weakness.  Psych      Denies alternate hallucination ( auditory/visual), anxiety, depression, easily angered, easily tearful, irritability, mental  problems, panic attacks, sense of great danger, suicidal thoughts/plans, thoughts of violence, unusual visions or sounds, and thoughts /plans of harming others.  Endo      Denies cold intolerance, excessive hunger, excessive thirst, excessive urination, heat intolerance, polyuria, and weight change.  Heme      Denies abnormal bruising, bleeding, enlarge lymph nodes, fevers, pallor, and skin discoloration.  Allergy      Denies hives or rash, itching eyes, persistent infections, seasonal allergies, and sneezing.   Physical Exam  General:      alert and overweight-appearing.   Head:     normocephalic.  head wrap in place Eyes:     exophthalmoses (mild) Ears:     R ear normal and L ear normal.   Nose:     no external deformity.   Mouth:     fair dentition.   Neck:     supple.   Lungs:     Normal respiratory effort, chest expands symmetrically. Lungs are clear to auscultation, no crackles or wheezes. Heart:     Normal rate and regular rhythm. S1 and S2 normal without gallop, murmur, click, rub or other extra sounds. Abdomen:     soft, non-tender, and normal bowel sounds.   Msk:     up to exam table no limits Extremities:     trace left pedal edema and trace right pedal edema.   Neurologic:     alert & oriented X3.   Skin:     color normal.   Psych:     Oriented X3 and good eye contact.      Impression & Recommendations:  Problem # 1:  HYPERTENSION (ICD-401.9) Monitor sodium in diet. Her updated medication list for this problem includes:    Triamterene-hctz 37.5-25 Mg Caps (Triamterene-hctz) .Marland Kitchen... 1 tablet by mouth daily for blood pressure  Orders: T-General Health Panel (CBCD, CMP, TSH) ([id number]) T-Lipid Profile ([id number]) UA Dipstick w/o Micro (82956)   Problem # 2:  BIPOLAR DISORDER UNSPECIFIED (ICD-296.80) will schedule pt an appt to see Aquilla Solian at the other site. If pt is stable may be able to renew meds here at this site. Orders:  Psychology Referral (Psychology) T-General Health Panel (CBCD, CMP, TSH) ([id number]) T-Lipid Profile [id number]) T-Tegretol (Carbamazepine) [phone])   Complete Medication List: 1)  Tegretol 200 Mg Tabs (Carbamazepine) .Marland Kitchen.. 1 tablet by mouth at night 2)  Triamterene-hctz 37.5-25 Mg Caps (Triamterene-hctz) .Marland Kitchen.. 1 tablet by mouth daily for blood pressure   Patient Instructions: 1)  Your labs will be drawn today 2)  You will get an appointment to see Aquilla Solian at the [facility]    Prescriptions: TRIAMTERENE-HCTZ 37.5-25 MG  CAPS (TRIAMTERENE-HCTZ) 1  tablet by mouth daily for blood pressure  #30 x 3   Entered and Authorized by:   Lehman Prom FNP   Signed by:   Lehman Prom FNP on 04/17/2007   Method used:   Print then Give to Patient   RxID:   [id number] TEGRETOL 200 MG  TABS (CARBAMAZEPINE) 1 tablet by mouth at night  #30 x 3   Entered and Authorized by:   Lehman Prom FNP   Signed by:   Lehman Prom FNP on 04/17/2007   Method used:   Print then Give to Patient   RxID:   [id number]  ] Laboratory Results   Urine Tests  Date/Time Received: April 17, 2007 10:20 AM Date/Time Reported: April 17, 2007 10:21 AM  Routine Urinalysis   Color: yellow Appearance: Clear Glucose: negative   (Normal Range: Negative) Bilirubin: negative   (Normal Range: Negative) Ketone: negative   (Normal Range: Negative) Spec. Gravity: <1.005   (Normal Range: 1.003-1.035) Blood: negative   (Normal Range: Negative) pH: 8.0   (Normal Range: 5.0-8.0) Protein: trace   (Normal Range: Negative) Urobilinogen: 0.2   (Normal Range: 0-1) Nitrite: negative   (Normal Range: Negative) Leukocyte Esterace: negative   (Normal Range: Negative)

## 2010-06-29 NOTE — Assessment & Plan Note (Signed)
Summary: Complete Physical Exam   Vital Signs:  Patient profile:   50 year old female Menstrual status:  regular LMP:     04/18/2009 Height:      63.0 inches Weight:      241 pounds BMI:     42.85 BSA:     2.10 Temp:     98.1 degrees F oral Pulse rate:   84 / minute Pulse rhythm:   regular Resp:     20 per minute BP sitting:   144 / 85  (right arm) Cuff size:   large  Vitals Entered By: Arthor Captain 04/27/2009  Nutrition Counseling: Patient's BMI is greater than 25 and therefore counseled on weight management options. CC: CPP, Hypertension Management Is Patient Diabetic? No Pain Assessment Patient in pain? no       Does patient need assistance? Functional Status Self care Ambulation Normal LMP (date): 04/18/2009 LMP - Character: normal     Menstrual flow (days): 4 Menstrual Status regular Enter LMP: 04/18/2009 Last PAP Result negative   CC:  CPP and Hypertension Management.  History of Present Illness:  Pt into the office for a complete physical exam  PAP - Normal PAP smears in the past.  Regular menses monthly. 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  Optho - last done 2-3 years ago. no current glasses by optho but she has lately noticed that she is requiring reading glasses  Dental - last appt was 9 months ago.  Hypertension History:      She denies headache, chest pain, and dyspnea with exertion.  She notes no problems with any antihypertensive medication side effects.  Pt is forgetting some of her medication dosages.        Positive major cardiovascular risk factors include hypertension.  Negative major cardiovascular risk factors include female age less than 42 years old and non-tobacco-user status.        Further assessment for target organ damage reveals no history of ASHD, cardiac end-organ damage  (CHF/LVH), stroke/TIA, peripheral vascular disease, renal insufficiency, or hypertensive retinopathy.     Habits & Providers  Alcohol-Tobacco-Diet     Alcohol drinks/day: 0     Tobacco Status: never  Exercise-Depression-Behavior     Does Patient Exercise: no     Have you felt down or hopeless? no     Have you felt little pleasure in things? no     Drug Use: no     Seat Belt Use: 100     Sun Exposure: occasionally  Comments: Pt is going to the [facility]. PHQ-9 score = 9  Allergies (verified): No Known Drug Allergies  Social History: Does Patient Exercise:  no  Review of Systems General:  Denies fever. Eyes:  Denies discharge. ENT:  Denies earache. CV:  Denies chest pain or discomfort. Resp:  Denies cough. GI:  Denies abdominal pain, nausea, and vomiting. GU:  Denies discharge. MS:  Denies low back pain. Derm:  Complains of lesion(s); denies rash; chronic under bilateral arms. Neuro:  Denies headaches. Psych:  Denies depression. Endo:  Denies excessive urination.  Physical Exam  General:  alert.   Head:  normocephalic.   Eyes:  pupils equal and pupils round.   Ears:  ear piercing(s) noted.  bil TM with clear fluid bilaterally Nose:  no nasal discharge.   Mouth:  pharynx pink and moist and fair dentition.   Neck:  supple.   Chest Wall:  no mass.   Breasts:  right breast - lump noted at 6 o'clock left breast - dense tissue in left breast but no masses Lungs:  normal breath sounds.   Heart:  normal rate and regular rhythm.   Abdomen:  soft, non-tender, and normal bowel sounds.   Rectal:  no hemorrhoids.   Msk:  up to the exam table Extremities:  no edema Neurologic:  alert & oriented X3, cranial nerves II-XII intact, and gait normal.   Skin:  bil underarms R>L - hyperpigmented areas right with some palpable papules but no open areas  abd - crusted papule Psych:  Oriented X3.    Pelvic Exam  Vulva:      normal appearance.   Urethra and Bladder:       Urethra--no discharge.   Vagina:      physiologic discharge.   Cervix:      midposition, parous.   Adnexa:      nontender bilaterally.   Rectum:      normal, heme negative stool.      Impression & Recommendations:  Problem # 1:  ROUTINE GYNECOLOGICAL EXAMINATION (ICD-V72.31) labs done  PAP done  guaiac negative rec optho and dental exam PHQ-9 score = 9 Orders: KOH/ WET Mount [phone]) Pap Smear, Thin Prep ( Collection of) [phone]) T-Lipid Profile [id number]) T-Comprehensive Metabolic Panel [id number]) T-CBC w/Diff ([id number]) T-TSH ([id number]) T-Urine Microalbumin w/creat. ratio [id number]) Hemoccult Guaiac-1 spec.(in office) (82270) T- GC Chlamydia (37169)  Problem # 2:  UNSPECIFIED BREAST SCREENING (ICD-V76.10) self breast exam recommended right breast lump noted mammogram scheduled Orders: Mammogram (Screening) (Mammo)  Problem # 3:  HYPERTENSION (ICD-401.9) BP is still elevated. Will need to start lisinopril and continue other meds DASH diet Her updated medication list for this problem includes:    Triamterene-hctz 37.5-25 Mg Caps (Triamterene-hctz) .Marland Kitchen... Take one (1) by mouth daily    Lisinopril 5 Mg Tabs (Lisinopril) ..... One tablet by mouth daily for blood pressure  Orders: EKG w/ Interpretation (93000) UA Dipstick w/o Micro (manual) (67893)  Problem # 4:  OBESITY (ICD-278.00) need to increase exercise and portion control attributes weight gain to zypreza  Problem # 5:  HIDRADENITIS SUPPURATIVA (ICD-705.83) reviewed DX with pt will continue on prophylactic antibiotics for now but advised pt that this is not ideal  Problem # 6:  BIPOLAR DISORDER UNSPECIFIED (ICD-296.80) continue f/u with mental health  Complete Medication List: 1)  Tegretol 200 Mg Tabs (Carbamazepine) .Marland Kitchen.. 1 tablet by mouth two times a day 2)  Triamterene-hctz 37.5-25 Mg Caps (Triamterene-hctz) .... Take one (1) by mouth daily 3)  Minocycline Hcl 50 Mg Caps  (Minocycline hcl) .Marland Kitchen.. 1 tablet by mouth two times a day 4)  Cleocin-t 1 % Lotn (Clindamycin phosphate) .... Apply two times a day to affectd areas 5)  Zyprexa 10 Mg Tabs (Olanzapine) .... Take one tablet by mouth daily **rx by [facility]** 6)  Zyprexa 15 Mg Tabs (Olanzapine) .... One tablet by mouth at bedtime **rx by [facility]** 7)  Lisinopril 5 Mg Tabs (Lisinopril) .... One tablet by mouth daily for blood pressure  Hypertension Assessment/Plan:      The patient's hypertensive risk group is category A: No risk factors and no target organ damage.  Her calculated 10 year risk of coronary heart disease is 4 %.  Today's blood pressure is 144/85.  Her blood pressure goal is < 140/90.  Patient  Instructions: 1)  Your blood pressure is still slightly elevated than normal today.  This is not the first time. 2)  Continue current medications and based on labs you may need additional medications. 3)  Keep your appointment for mammogram 4)  If you decide to take the flu vaccine then inform this office 5)  Follow up as needed  Prescriptions: LISINOPRIL 5 MG TABS (LISINOPRIL) One tablet by mouth daily for blood pressure  #30 x 5   Entered and Authorized by:   Lehman Prom FNP   Signed by:   Lehman Prom FNP on 04/27/2009   Method used:   Faxed to ...       [facility] (retail)       [address] [place], [place]  [address]       Ph: [phone]       Fax: [phone]   RxID:   [id number]   Laboratory Results   Urine Tests  Date/Time Received: April 27, 2009 11:38 AM  Date/Time Reported: April 27, 2009 11:38 AM    Routine Urinalysis   Color: lt. yellow Appearance: Clear Glucose: negative   (Normal Range: Negative) Bilirubin: negative   (Normal Range: Negative) Ketone: negative   (Normal Range: Negative) Spec. Gravity: 1.010   (Normal Range: 1.003-1.035) Blood: trace-intact   (Normal Range: Negative) pH: 6.5    (Normal Range: 5.0-8.0) Protein: negative   (Normal Range: Negative) Urobilinogen: negative   (Normal Range: 0-1) Nitrite: negative   (Normal Range: Negative) Leukocyte Esterace: negative   (Normal Range: Negative)    Date/Time Received: April 27, 2009 1:02 PM   Wet Mount/KOH Source: vaginal WBC/hpf: 1-5 Bacteria/hpf: rare Clue cells/hpf: none Yeast/hpf: none Trichomonas/hpf: none  Stool - Occult Blood Hemmoccult #1: negative Date: 04/27/2009    Prevention & Chronic Care Immunizations   Influenza vaccine: none available in this office - pt advised to check local pharmacy  (06/20/2008)   Influenza vaccine deferral: Refused  (04/27/2009)    Tetanus booster: 05/30/2005: per pt at previous provider    Pneumococcal vaccine: Not documented  Other Screening   Pap smear: negative  (12/21/2007)   Pap smear action/deferral: Ordered  (04/27/2009)    Mammogram: negative  (05/16/2007)   Mammogram action/deferral: Ordered  (04/27/2009)   Smoking status: never  (04/27/2009)  Lipids   Total Cholesterol: 207  (04/17/2007)   Lipid panel action/deferral: Lipid Panel ordered   LDL: 107  (04/17/2007)   LDL Direct: Not documented   HDL: 71  (04/17/2007)   Triglycerides: 143  (04/17/2007)  Hypertension   Last Blood Pressure: 144 / 85  (04/27/2009)   Serum creatinine: 0.83  (04/17/2007)   BMP action: Ordered   Serum potassium 4.4  (04/17/2007) CMP ordered     Hypertension flowsheet reviewed?: Yes   Progress toward BP goal: Unchanged  Self-Management Support :   Personal Goals (by the next clinic visit) :      Personal blood pressure goal: 130/80  (04/27/2009)   Patient will work on the following items until the next clinic visit to reach self-care goals:     Medications and monitoring: take my medicines every day, bring all of my medications to every visit  (04/27/2009)     Eating: eat foods that are low in salt, eat fruit for snacks and desserts  (04/27/2009)     Hypertension self-management support: Not documented   Nursing Instructions: Pap smear today   Laboratory Results  Urine Tests    Routine Urinalysis   Color: lt. yellow Appearance: Clear Glucose: negative   (Normal Range: Negative) Bilirubin: negative   (Normal Range: Negative) Ketone: negative   (Normal Range: Negative) Spec. Gravity: 1.010   (Normal Range: 1.003-1.035) Blood: trace-intact   (Normal Range: Negative) pH: 6.5   (Normal Range: 5.0-8.0) Protein: negative   (Normal Range: Negative) Urobilinogen: negative   (Normal Range: 0-1) Nitrite: negative   (Normal Range: Negative) Leukocyte Esterace: negative   (Normal Range: Negative)      Wet Mount Wet Mount KOH: Negative  Stool - Occult Blood Hemmoccult #1: negative     Appended Document: Complete Physical Exam  Laboratory Results  Date/Time Received: April 27, 2009 3:40 PM   Other Tests  Rapid HIV: negative

## 2010-06-29 NOTE — Letter (Signed)
Summary: *HSN Results Follow up  [facility]  [address] [place], [place] [address]   Phone: [phone]  Fax: [phone]      12/21/2007   LENAE WHERLEY [address] [place], [place]  [address]   Dear  Ms. Lowell Bouton,                            ____S.Drinkard,FNP   ____D. Gore,FNP       ____B. McPherson,MD   ____V. Rankins,MD    ____E. Mulberry,MD    _X___N. Daphine Deutscher, FNP  ____D. Reche Dixon, MD    ____K. Philipp Deputy, MD    ____Other     This letter is to inform you that your recent test(s):  __X_____Pap Smear    ___X____Lab Test     _______X-ray    ___X____ is within acceptable limits  _______ requires a medication change  _______ requires a follow-up lab visit  _______ requires a follow-up visit with your provider   Comments:  Labs done during recent office visit were normal. PAP results ______________________________.       _________________________________________________________ If you have any questions, please contact our office                     Sincerely,  Lehman Prom FNP [facility]

## 2010-06-29 NOTE — Progress Notes (Signed)
Summary: Dermatoogy Referral   Phone Note Call from Patient   Caller: Patient Reason for Call: Referral Summary of Call: Pt wants Dr Daphine Deutscher to referral to D[facility]atology [facility] . Please, call her at [phone]  Thank You  Initial call taken by: Cheryll Dessert,  November 12, 2007 11:04 AM  Follow-up for Phone Call        pt states she would like referral at [facility] if the private derm was not going to be able to see her. Follow-up by: Mikey College CMA,  November 13, 2007 4:48 PM

## 2010-06-29 NOTE — Letter (Signed)
Summary: Lipid Letter  [facility]  [address] [place], [place] [address]   Phone: [phone]  Fax: [phone]    04/28/2009  Stacie Daugherty [address] [place], [place]  [address]  Dear Stacie Daugherty:  We have carefully reviewed your last lipid profile from 04/27/2009 and the results are noted below with a summary of recommendations for lipid management.    Cholesterol:       222     Goal: less than 200   HDL "good" Cholesterol:   69     Goal: greater than 40   LDL "bad" Cholesterol:   134     Goal: less than 100   Triglycerides:       94     Goal: less than 150    Your cholesterol is slightly elevated.  No medications needed at this time but you should start a low fat low cholesterol diet.  You should avoid fried and fatty foods.  Also, exercise such as walking15-20 minutes 4-5 times per week helps to improve cholesterol.  Pap Smear results ________________________________.      Current Medications: 1)    Tegretol 200 Mg  Tabs (Carbamazepine) .Marland Kitchen.. 1 tablet by mouth two times a day 2)    Triamterene-hctz 37.5-25 Mg Caps (Triamterene-hctz) .... Take one (1) by mouth daily 3)    Minocycline Hcl 50 Mg  Caps (Minocycline hcl) .Marland Kitchen.. 1 tablet by mouth two times a day 4)    Cleocin-t 1 % Lotn (Clindamycin phosphate) .... Apply two times a day to affectd areas 5)    Zyprexa 10 Mg Tabs (Olanzapine) .... Take one tablet by mouth daily **rx by [facility]** 6)    Zyprexa 15 Mg Tabs (Olanzapine) .... One tablet by mouth at bedtime **rx by [facility]** 7)    Lisinopril 5 Mg Tabs (Lisinopril) .... One tablet by mouth daily for blood pressure  If you have any questions, please call. We appreciate being able to work with you.   Sincerely,    Nizar Cutler Martin,FNP [facility]

## 2010-06-29 NOTE — Assessment & Plan Note (Signed)
Summary: Bipolar/HTN   Vital Signs:  Patient Profile:   50 Years Old Female LMP:     06/18/2008 Height:     63.25 inches Weight:      221.25 pounds BMI:     39.02 BSA:     2.03 Temp:     97.2 degrees F oral Pulse rate:   76 / minute Pulse rhythm:   regular Resp:     18 per minute BP sitting:   120 / 80  (right arm) Cuff size:   large  Stacie Daugherty. in pain?   no  Vitals Entered By: [place] Shannon (June 20, 2008 8:24 AM)  Menstrual History: LMP (date): 06/18/2008                  Chief Complaint:  Stacie Daugherty says she has sweeling in her ankles since she started taking new meds: Zyprexa and Clonidine... Stacie Daugherty says her stomach has also been upset...  History of Present Illness:  Stacie Daugherty into the office for f/u s/p a hospitalization in [place]. She reports that while there she had a manic episode.  She was not sleeping. Last episode was 7 years ago.   She was admited to the hospital for 2 admission. She was there from Dec. 14 to January 5th.  Her husband had to leave on 05/24/2008 however he came back on 06/04/2007 to excort her back. Medications changed during hospitalizations.  Stacie Daugherty has been to [facility]. She has an appointment with [facility] but it is on monday.  Dermatology - she went to the dermatology and was given an Rx for Cipro.She has finished the current supply and is now back to taking the doxycycline which was prescribed by this office.  Bipolar - Stacie Daugherty has been sleeping mainly during the day. She is still not employed.   She is married with 57 - 14 year old child    Hypertension History:      She complains of peripheral edema, but denies headache and chest pain.  Further comments include: Stacie Daugherty has stopped the Triamterene/HCTZ  since her hospital visit.  She was started on clonidine 0.2mg  in the hospital but she is only taking once per day.        Positive major cardiovascular risk factors include hypertension.  Negative major cardiovascular risk factors include female age  less than 56 years old and non-tobacco-user status.        Further assessment for target organ damage reveals no history of ASHD, cardiac end-organ damage (CHF/LVH), stroke/TIA, peripheral vascular disease, renal insufficiency, or hypertensive retinopathy.       Prior Medications Reviewed Using: Medication Bottles  Current Allergies (reviewed today): No known allergies      Review of Systems  CV      Complains of swelling of feet.      Denies chest pain or discomfort.  Resp      Denies cough.  GI      Denies abdominal pain.  Psych      Complains of mental problems.      recent hospitalization for bipolar - manic episode   Physical Exam  General:     alert and overweight-appearing.   Head:     normocephalic.   Lungs:     normal breath sounds.   Heart:     normal rate and regular rhythm.   Abdomen:     soft, non-tender, and normal bowel sounds.   Msk:     up to exam  Extremities:  1+ left pedal edema and 1+ right pedal edema.   Neurologic:     alert & oriented X3.   Psych:     tearful during exam    Impression & Recommendations:  Problem # 1:  BIPOLAR DISORDER UNSPECIFIED (ICD-296.80) s/p recent hospitalization in [place] Stacie Daugherty has appt with Dr. Abbe Amsterdam at [facility] next week - Stacie Daugherty advised to keep that appt  Problem # 2:  HYPERTENSION (ICD-401.9) Will chang BP meds. She is having pedal edema since stopping diuretic The following medications were removed from the medication list:    Clonidine Hcl 0.2 Mg Tabs (Clonidine hcl) .Marland Kitchen... 1 tablet by mouth two times a day for high blood pressure  Her updated medication list for this problem includes:    Triamterene-hctz 37.5-25 Mg Caps (Triamterene-hctz) .Marland Kitchen... Take one (1) by mouth daily    Lisinopril 5 Mg Tabs (Lisinopril) .Marland Kitchen... 1 tablet by mouth daily for blood pressure   Problem # 3:  OBESITY (ICD-278.00) advised increase in activity  Complete Medication List: 1)  Tegretol 200 Mg Tabs  (Carbamazepine) .Marland Kitchen.. 1 tablet by mouth two times a day 2)  Triamterene-hctz 37.5-25 Mg Caps (Triamterene-hctz) .... Take one (1) by mouth daily 3)  Nystatin-triamcinolone 100000-0.1 Unit/gm-% Oint (Nystatin-triamcinolone) .Marland Kitchen.. 1 application topically two times a day to affected area 4)  Minocycline Hcl 50 Mg Caps (Minocycline hcl) .Marland Kitchen.. 1 tablet by mouth two times a day 5)  Cleocin-t 1 % Lotn (Clindamycin phosphate) .... Apply two times a day to affectd areas 6)  Zyprexa 10 Mg Tabs (Olanzapine) .... Take one tablet by mouth daily **rx by [facility]** 7)  Zyprexa 15 Mg Tabs (Olanzapine) .... One tablet by mouth at bedtime **rx by [facility]** 8)  Trazodone Hcl 50 Mg Tabs (Trazodone hcl) .Marland Kitchen.. 1 tablet by mouth at bedtime for insomnia 9)  Lisinopril 5 Mg Tabs (Lisinopril) .Marland Kitchen.. 1 tablet by mouth daily for blood pressure  Hypertension Assessment/Plan:      The patient's hypertensive risk group is category A: No risk factors and no target organ damage.  Her calculated 10 year risk of coronary heart disease is 3 %.  Today's blood pressure is 120/80.  Her blood pressure goal is < 140/90.   Patient Instructions: 1)  Keep your appointment with Dr. Chad Cordial. 2)  Stop the clonidine. 3)  Restart Triamterene-HCTZ 37.5/25 - this will help with swelling in feet. 4)  You will also need lisinopril 5mg  by mouth daily.  This is for additional blood pressure coverage 5)  Follow up here in 3 weeks for blood pressure check   Prescriptions: LISINOPRIL 5 MG TABS (LISINOPRIL) 1 tablet by mouth daily for blood pressure  #30 x 3   Entered and Authorized by:   Lehman Prom FNP   Signed by:   Lehman Prom FNP on 06/20/2008   Method used:   Printed then faxed to ...       [facility] (retail)       [address] [name], [place]  [address]       Ph: [phone]       Fax: [phone]   RxID:   [id number] TRIAMTERENE-HCTZ 37.5-25 MG CAPS  (TRIAMTERENE-HCTZ) Take one (1) by mouth daily  #30 x 3   Entered and Authorized by:   Lehman Prom FNP   Signed by:   Lehman Prom FNP on 06/20/2008   Method used:   Print then Give to Patient  RxID:   [id number]

## 2010-06-29 NOTE — Progress Notes (Signed)
Summary: Office Visit//DEPRESSION SCREENING   Office Visit//DEPRESSION SCREENING   Imported By: Arta Bruce 06/18/2009 14:36:46  _____________________________________________________________________  External Attachment:    Type:   Image     Comment:   External Document

## 2010-06-29 NOTE — Letter (Signed)
Summary: Generic Letter  [facility]  [address] [place], [place] [address]   Phone: [phone]  Fax: [phone]       12/30/2009  Stacie Daugherty [address] [place], [place]  [address]  Dear [name],  We have been unable to contact you by telepyhone.  Please call our office, at your earliest convenience, so that we may speak with you.   Sincerely,   Dutch Quint RN

## 2010-06-29 NOTE — Letter (Signed)
Summary: MAILED REQUESTED RECORDS TO [facility]  MAILED REQUESTED RECORDS TO [facility]   Imported By: Arta Bruce 04/30/2010 09:50:13  _____________________________________________________________________  External Attachment:    Type:   Image     Comment:   External Document

## 2010-06-29 NOTE — Letter (Signed)
Summary: MAILED RECORDS TO [facility]  MAILED RECORDS TO [facility]   Imported By: Silvio Pate Stanislawscyk 05/07/2010 14:00:43  _____________________________________________________________________  External Attachment:    Type:   Image     Comment:   External Document

## 2010-06-29 NOTE — Assessment & Plan Note (Signed)
Summary: HTN/Knee pain   Vital Signs:  Patient profile:   50 year old female Menstrual status:  regular LMP:     04/29/2010 Weight:      218.31 pounds Temp:     97.6 degrees F oral Pulse rate:   64 / minute Pulse rhythm:   regular Resp:     16 per minute BP sitting:   142 / 90  (left arm) Cuff size:   regular  Vitals Entered By: Hale Drone CMA (April 29, 2010 11:15 AM) CC: Complaining of right knee pain. Fell on 10/30/11from a golf cart. Also, concerned about some lumps on her left breast. First noticed a couple of months ago that have not gone away.  Dry skin concerns. Has not taken her BP med. for the last 2 days. , Hypertension Management, Lipid Management Is Patient Diabetic? No Pain Assessment Patient in pain? no       Does patient need assistance? Functional Status Self care Ambulation Normal LMP (date): 04/29/2010 LMP - Character: normal     Menstrual flow (days): 4 Enter LMP: 04/29/2010 Last PAP Result NEGATIVE FOR INTRAEPITHELIAL LESIONS OR MALIGNANCY.   CC:  Complaining of right knee pain. Fell on 10/30/11from a golf cart. Also, concerned about some lumps on her left breast. First noticed a couple of months ago that have not gone away.  Dry skin concerns. Has not taken her BP med. for the last 2 days. , Hypertension Management, and Lipid Management.  History of Present Illness:  Pt into the office scheduled for a CPE However cycle started this morning so will only draw blood today as pt is fasing  Mammogram - done last year 05/20/2009 will go ahead and schedule today.   S/p Fall on 03/28/2010 - she fell of a golf card while trying to get off. She hit the ground and got up and was able to walk. No swelling  She has applied ice to the knee  However she has noticed that the knee is now stiff with standing after sitting for a long periof of time Extension of the leg is also painful   Hypertension History:      She denies headache, chest pain,  palpitations, and dyspnea with exertion.  pt admits that she has not taken her meds for the past 2 days She was ordered lisinopril on previous visit but pt did not start.        Positive major cardiovascular risk factors include hyperlipidemia and hypertension.  Negative major cardiovascular risk factors include female age less than 85 years old and non-tobacco-user status.        Further assessment for target organ damage reveals no history of ASHD, cardiac end-organ damage (CHF/LVH), stroke/TIA, peripheral vascular disease, renal insufficiency, or hypertensive retinopathy.    Lipid Management History:      Positive NCEP/ATP III risk factors include hypertension.  Negative NCEP/ATP III risk factors include female age less than 63 years old, HDL cholesterol greater than 60, non-tobacco-user status, no ASHD (atherosclerotic heart disease), no prior stroke/TIA, no peripheral vascular disease, and no history of aortic aneurysm.        The patient states that she does not know about the "Therapeutic Lifestyle Change" diet.  Comments include: will check labs today since pt is fasting.      Habits & Providers  Alcohol-Tobacco-Diet     Alcohol drinks/day: 0     Tobacco Status: never  Exercise-Depression-Behavior     Does Patient Exercise: no  Have you felt down or hopeless? yes     Have you felt little pleasure in things? yes     Depression Counseling: not indicated; screening negative for depression     Drug Use: no     Seat Belt Use: 100     Sun Exposure: occasionally  Comments: PHQ-9 score = 19 Pt goes to the [facility] and maintains follow up there  Current Medications (verified): 1)  Tegretol 200 Mg  Tabs (Carbamazepine) .Marland Kitchen.. 1 Tablet By Mouth Two Times A Day 2)  Triamterene-Hctz 37.5-25 Mg Tabs (Triamterene-Hctz) .... Take 1 Tablet By Mouth Once A Day 3)  Minocycline Hcl 50 Mg  Caps (Minocycline Hcl) .Marland Kitchen.. 1 Tablet By Mouth Two Times A Day 4)  Zyprexa 10 Mg Tabs (Olanzapine)  .... Take One Tablet By Mouth Daily **rx By [facility]** 5)  Zyprexa 15 Mg Tabs (Olanzapine) .... One Tablet By Mouth At Bedtime **rx By [facility]** 6)  Lisinopril 5 Mg Tabs (Lisinopril) .... Hold 7)  Clotrimazole 1 % Crea (Clotrimazole) .... Use To Affected Area Two Times A Day Until Healed  Allergies (verified): No Known Drug Allergies  Review of Systems CV:  Denies chest pain or discomfort. Resp:  Denies cough. GI:  Denies abdominal pain, nausea, and vomiting. MS:  Complains of joint pain; right knee pain.  Physical Exam  General:  alert.   Head:  normocephalic.   Ears:  ear piercing(s) noted.   Lungs:  normal breath sounds.   Heart:  normal rate and regular rhythm.     Knee Exam  General:    obese.    Knee Exam:    Right:    Inspection:  Normal    Palpation:  Normal    Stability:  stable    Tenderness:  patellar    Swelling:  no    Erythema:  no   Impression & Recommendations:  Problem # 1:  HYPERTENSION (ICD-401.9) BP elevated pt has not taken meds today - she is also not taking lisinopril as previously ordered advised that she needs adequate bp and weight control Her updated medication list for this problem includes:    Triamterene-hctz 37.5-25 Mg Tabs (Triamterene-hctz) .Marland Kitchen... Take 1 tablet by mouth once a day    Lisinopril 5 Mg Tabs (Lisinopril) ..... Hold  Orders: T-Comprehensive Metabolic Panel [phone]) T-CBC w/Diff ([id number]) Rapid HIV  (29562) T-TSH ([phone])  Problem # 2:  HYPERCHOLESTEROLEMIA (ICD-272.0) will check labs today Orders: T-Lipid Profile ([id number])  Problem # 3:  UNSPECIFIED BREAST SCREENING (ICD-V76.10) self breast exam placcard given to pt will order mammogram Orders: Mammogram (Screening) (Mammo)  Problem # 4:  OBESITY (ICD-278.00) again advised that pt needs to start weight loss down 5 pounds since the last visit Orders: T-TSH ([id number])  Problem # 5:  KNEE PAIN (ICD-719.46) advised   conservative management take anti-inflammatory for 3 days then as needed Her updated medication list for this problem includes:    Ibuprofen 800 Mg Tabs (Ibuprofen) ..... One tablet by mouth two times a day for knee pain  Complete Medication List: 1)  Tegretol 200 Mg Tabs (Carbamazepine) .Marland Kitchen.. 1 tablet by mouth two times a day 2)  Triamterene-hctz 37.5-25 Mg Tabs (Triamterene-hctz) .... Take 1 tablet by mouth once a day 3)  Minocycline Hcl 50 Mg Caps (Minocycline hcl) .Marland Kitchen.. 1 tablet by mouth two times a day 4)  Zyprexa 10 Mg Tabs (Olanzapine) .... **rx by [facility]** 5)  Lisinopril 5 Mg Tabs (Lisinopril) .... Hold 6)  Clotrimazole 1 % Crea (Clotrimazole) .... Use to affected area two times a day until healed 7)  Trazodone Hcl 100 Mg Tabs (Trazodone hcl) .... Rx per [facility] 8)  Ibuprofen 800 Mg Tabs (Ibuprofen) .... One tablet by mouth two times a day for knee pain  Hypertension Assessment/Plan:      The patient's hypertensive risk group is category B: At least one risk factor (excluding diabetes) with no target organ damage.  Her calculated 10 year risk of coronary heart disease is 4 %.  Today's blood pressure is 142/90.  Her blood pressure goal is < 140/90.  Lipid Assessment/Plan:      Based on NCEP/ATP III, the patient's risk factor category is "0-1 risk factors".  The patient's lipid goals are as follows: Total cholesterol goal is 200; LDL cholesterol goal is 160; HDL cholesterol goal is 40; Triglyceride goal is 150.    Patient Instructions: 1)  Keep your appointment for a complete physical exam in January. 2)  Get your mammogram as scheduled. 3)  Take your medications before the next visit so we can see how well your blood pressure is doing 4)  Right knee - likely inflammation of the "fat pad" in your knee. 5)  This is the shock absorber so when you stand, put your left leg over the right, bend it absorbs the weight.  The fall has likely made this inflammed. 6)  Take  ibuprofen 800mg  by mouth two times a day for inflammation (take with food).  You should take this for at least 3 days to help with inflammation.  You can get this from [facility] for $4 7)  Blood pressue - High today.  Take your medications before your next visit Prescriptions: IBUPROFEN 800 MG TABS (IBUPROFEN) One tablet by mouth two times a day for knee pain  #50 x 0   Entered and Authorized by:   Lehman Prom FNP   Signed by:   Lehman Prom FNP on 04/29/2010   Method used:   Print then Give to Patient   RxID:   [id number] MINOCYCLINE HCL 50 MG  CAPS (MINOCYCLINE HCL) 1 tablet by mouth two times a day  #60 x 1   Entered and Authorized by:   Lehman Prom FNP   Signed by:   Lehman Prom FNP on 04/29/2010   Method used:   Faxed to ...       [facility] (retail)       [address] [place], [place]  [address]       Ph: [phone]       Fax: [phone]   RxID:   [id number]    Orders Added: 1)  Est. Patient Level III [99213] 2)  T-Lipid Profile [80061-22930] 3)  T-Comprehensive Metabolic Panel [80053-22900] 4)  T-CBC w/Diff [[id number]] 5)  Rapid HIV  [92370] 6)  T-TSH [95638-75643] 7)  Mammogram (Screening) [Mammo]   Not Administered:    Influenza Vaccine not given due to: declined    Prevention & Chronic Care Immunizations   Influenza vaccine: none available in this office - pt advised to check local pharmacy  (06/20/2008)   Influenza vaccine deferral: Refused  (04/27/2009)    Tetanus booster: 05/30/2005: per pt at previous provider    Pneumococcal vaccine: Not documented  Other Screening   Pap smear: NEGATIVE FOR INTRAEPITHELIAL LESIONS OR MALIGNANCY.  (04/28/2009)   Pap smear action/deferral: Ordered  (04/27/2009)   Pap smear due: 04/2010  Mammogram: BI-RADS CATEGORY 2:  Benign finding(s).^MM DIGITAL DIAGNOSTIC BILAT LTD  (05/18/2009)   Mammogram action/deferral: Ordered   (04/27/2009)   Smoking status: never  (04/29/2010)  Lipids   Total Cholesterol: 222  (04/27/2009)   Lipid panel action/deferral: Lipid Panel ordered   LDL: 134  (04/27/2009)   LDL Direct: Not documented   HDL: 69  (04/27/2009)   Triglycerides: 94  (04/27/2009)    SGOT (AST): 16  (04/27/2009)   SGPT (ALT): 16  (04/27/2009) CMP ordered    Alkaline phosphatase: 54  (04/27/2009)   Total bilirubin: 0.3  (04/27/2009)  Hypertension   Last Blood Pressure: 142 / 90  (04/29/2010)   Serum creatinine: 0.81  (04/27/2009)   BMP action: Ordered   Serum potassium 4.8  (04/27/2009) CMP ordered   Self-Management Support :   Personal Goals (by the next clinic visit) :      Personal blood pressure goal: 130/80  (04/27/2009)   Hypertension self-management support: Not documented    Lipid self-management support: Not documented   Laboratory Results   Urine Tests  Date/Time Received:      Blood Tests   Date/Time Received:    Date/Time Received: April 29, 2010 12:34 PM  Date/Time Reported:   Other Tests  Rapid HIV: negative

## 2010-06-29 NOTE — Miscellaneous (Signed)
Summary: Med addition  Clinical Lists Changes med added as per dermatology visit.   details of visit to be scanned in chart Medications: Added new medication of CLEOCIN-T 1 % LOTN (CLINDAMYCIN PHOSPHATE) apply two times a day to affectd areas

## 2010-06-29 NOTE — Letter (Signed)
Summary: PSYCHOLOGY REFERRAL/NO SHOW  PSYCHOLOGY REFERRAL/NO SHOW   Imported By: Arta Bruce 01/16/2008 14:34:47  _____________________________________________________________________  External Attachment:    Type:   Image     Comment:   External Document

## 2010-06-29 NOTE — Assessment & Plan Note (Signed)
Summary: Acute - Skin Rash   Vital Signs:  Patient profile:   50 year old female Menstrual status:  regular Height:      63 inches Weight:      223 pounds BMI:     39.65 Temp:     98.0 degrees F oral Pulse rate:   75 / minute Pulse rhythm:   regular Resp:     18 per minute BP sitting:   117 / 82  (left arm) Cuff size:   large  Vitals Entered By: [place] Shannon (December 24, 2009 2:13 PM)  Nutrition Counseling: Patient's BMI is greater than 25 and therefore counseled on weight management options. CC: pt says her skin is breaking out in patches which does itch......Marland Kitchen pt says the patches has been there for the past 2/3 months...Marland KitchenMarland Kitchen pt says she would like a cream to take.... pt says she still have the knots under her arms but out of rx...  pt wants to take STD test..., Rash, Hypertension Management Is Patient Diabetic? No Pain Assessment Patient in pain? no       Does patient need assistance? Functional Status Self care Ambulation Normal   CC:  pt says her skin is breaking out in patches which does itch......Marland Kitchen pt says the patches has been there for the past 2/3 months...Marland KitchenMarland Kitchen pt says she would like a cream to take.... pt says she still have the knots under her arms but out of rx...  pt wants to take STD test..., Rash, and Hypertension Management.  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 She wears gloves constantly at work.  Rash      This is a 50 year old woman who presents with Rash.  The symptoms began 1 month ago.  The severity is described as mild.  The patient complains of itching.  The rash is located on the right arm, right hand, left arm, and left hand.  The patient denies the following symptoms: fever, nausea, and vomiting.   Pt has been applying antifungal medication to the affected area without total resolution of the symptoms. Itching improved.  Obesity - down 16 pounds since her  last visit She has started a new job and has a more steady routine  Pt is requesting STD testing. Pt is married but has doubts about fidelity.   No discharge  Hypertension History:      She denies headache, chest pain, and palpitations.  She notes no problems with any antihypertensive medication side effects.  Pt never started the lisinopril as ordered during the last visit.  She is only taking triamterine/HCTZ.        Positive major cardiovascular risk factors include hyperlipidemia and hypertension.  Negative major cardiovascular risk factors include female age less than 68 years old and non-tobacco-user status.        Further assessment for target organ damage reveals no history of ASHD, cardiac end-organ damage (CHF/LVH), stroke/TIA, peripheral vascular disease, renal insufficiency, or hypertensive retinopathy.     Allergies: No Known Drug Allergies  Review of Systems CV:  Denies chest pain or discomfort. Resp:  Denies cough. GI:  Denies abdominal pain, nausea, and vomiting. GU:  Denies discharge, dysuria, and urinary frequency. Derm:  Complains of itching and rash.  Physical Exam  General:  alert.   Head:  normocephalic.   Neurologic:  alert & oriented X3.   Skin:  bil  hands  circumscribed well demarcated lesions  Psych:  Oriented X3.     Impression & Recommendations:  Problem # 1:  SKIN RASH (ICD-782.1) ? fungal infection The following medications were removed from the medication list:    Nystatin-triamcinolone 100000-0.1 Unit/gm-% Oint (Nystatin-triamcinolone) ..... Use to affected area two times a day as needed Her updated medication list for this problem includes:    Clotrimazole 1 % Crea (Clotrimazole) ..... Use to affected area two times a day until healed  Problem # 2:  SCREENING EXAMINATION FOR VENEREAL DISEASE (ICD-V74.5) will check per pt request Orders: KOH/ WET [place] [phone]) T-GC Probe, urine [phone])  Problem # 3:  HYPERTENSION (ICD-401.9) BP  doing better today especially with weight reduction she never started lisinopril as ordered  will continue on diuretic Her updated medication list for this problem includes:    Triamterene-hctz 37.5-25 Mg Tabs (Triamterene-hctz) .Marland Kitchen... Take 1 tablet by mouth once a day    Lisinopril 5 Mg Tabs (Lisinopril) ..... Hold  Complete Medication List: 1)  Tegretol 200 Mg Tabs (Carbamazepine) .Marland Kitchen.. 1 tablet by mouth two times a day 2)  Triamterene-hctz 37.5-25 Mg Tabs (Triamterene-hctz) .... Take 1 tablet by mouth once a day 3)  Minocycline Hcl 50 Mg Caps (Minocycline hcl) .Marland Kitchen.. 1 tablet by mouth two times a day 4)  Zyprexa 10 Mg Tabs (Olanzapine) .... Take one tablet by mouth daily **rx by [facility]** 5)  Zyprexa 15 Mg Tabs (Olanzapine) .... One tablet by mouth at bedtime **rx by [facility]** 6)  Lisinopril 5 Mg Tabs (Lisinopril) .... Hold 7)  Clotrimazole 1 % Crea (Clotrimazole) .... Use to affected area two times a day until healed  Hypertension Assessment/Plan:      The patient's hypertensive risk group is category B: At least one risk factor (excluding diabetes) with no target organ damage.  Her calculated 10 year risk of coronary heart disease is 3 %.  Today's blood pressure is 117/82.  Her blood pressure goal is < 140/90.  Patient Instructions: 1)  Blood pressure - much improved today. 2)  Continue current medications 3)  Rash - Use antifungal cream to affected area until healed 4)  You will be notified of any abnormal lab results 5)  Follow up as needed Prescriptions: CLOTRIMAZOLE 1 % CREA (CLOTRIMAZOLE) Use to affected area two times a day until healed  #30gm x 0   Entered and Authorized by:   Lehman Prom FNP   Signed by:   Lehman Prom FNP on 12/24/2009   Method used:   Print then Give to Patient   RxID:   [id number] MINOCYCLINE HCL 50 MG  CAPS (MINOCYCLINE HCL) 1 tablet by mouth two times a day  #60 x 1   Entered and Authorized by:   Lehman Prom FNP    Signed by:   Lehman Prom FNP on 12/24/2009   Method used:   Print then Give to Patient   RxID:   [id number] TRIAMTERENE-HCTZ 37.5-25 MG TABS (TRIAMTERENE-HCTZ) Take 1 tablet by mouth once a day  #90 x 1   Entered and Authorized by:   Lehman Prom FNP   Signed by:   Lehman Prom FNP on 12/24/2009   Method used:   Print then Give to Patient   RxID:   [id number]

## 2010-06-29 NOTE — Assessment & Plan Note (Signed)
Summary: F/u mammogram   Vital Signs:  Patient profile:   50 year old female Menstrual status:  regular Height:      63 inches Weight:      239 pounds BMI:     42.49 Temp:     98.2 degrees F oral Pulse rate:   85 / minute Pulse rhythm:   regular Resp:     18 per minute BP sitting:   136 / 85  (left arm) Cuff size:   large  Vitals Entered By: [place] Shannon (June 24, 2009 8:57 AM) CC: pt is here for knot on her left breast...., Hypertension Management, Lipid Management Is Patient Diabetic? No Pain Assessment Patient in pain? no       Does patient need assistance? Functional Status Self care Ambulation Normal   CC:  pt is here for knot on her left breast...., Hypertension Management, and Lipid Management.  History of Present Illness: Pt into the office for clarification on her mammogram/ultrasound. She is still having some soreness in the left breast and is concerned with the length of discomfort  Monthly menses and she actually just finished her period during the last ultrasound. Area is not getting any larger and is actually non-tender at this time.     Hypertension History:      She denies headache, chest pain, and palpitations.  Pt is still NOT taking her lisinopril 5mg  as ordered. She is only taking the triamterine/HCTZ.        Positive major cardiovascular risk factors include hyperlipidemia and hypertension.  Negative major cardiovascular risk factors include female age less than 53 years old and non-tobacco-user status.        Further assessment for target organ damage reveals no history of ASHD, cardiac end-organ damage (CHF/LVH), stroke/TIA, peripheral vascular disease, renal insufficiency, or hypertensive retinopathy.    Lipid Management History:      Positive NCEP/ATP III risk factors include hypertension.  Negative NCEP/ATP III risk factors include female age less than 74 years old, HDL cholesterol greater than 60, non-tobacco-user status, no ASHD  (atherosclerotic heart disease), no prior stroke/TIA, no peripheral vascular disease, and no history of aortic aneurysm.        The patient states that she does not know about the "Therapeutic Lifestyle Change" diet.  The patient does not know about adjunctive measures for cholesterol lowering.  Comments include: Cholesterol was elevated during the last visit but no meds.      Current Medications (verified): 1)  Tegretol 200 Mg  Tabs (Carbamazepine) .Marland Kitchen.. 1 Tablet By Mouth Two Times A Day 2)  Triamterene-Hctz 37.5-25 Mg Caps (Triamterene-Hctz) .... Take One (1) By Mouth Daily 3)  Minocycline Hcl 50 Mg  Caps (Minocycline Hcl) .Marland Kitchen.. 1 Tablet By Mouth Two Times A Day 4)  Cleocin-T 1 % Lotn (Clindamycin Phosphate) .... Apply Two Times A Day To Affectd Areas 5)  Zyprexa 10 Mg Tabs (Olanzapine) .... Take One Tablet By Mouth Daily **rx By [facility]** 6)  Zyprexa 15 Mg Tabs (Olanzapine) .... One Tablet By Mouth At Bedtime **rx By [facility]** 7)  Lisinopril 5 Mg Tabs (Lisinopril) .... One Tablet By Mouth Daily For Blood Pressure  Allergies (verified): No Known Drug Allergies  Review of Systems General:  Left breast tenderness. CV:  Denies chest pain or discomfort. Resp:  Denies cough. GI:  Denies abdominal pain. Derm:  recurrent rash under the breast. Previously given ointment which was effective.  Physical Exam  General:  alert.   Head:  normocephalic.   Lungs:  normal breath sounds.   Heart:  normal rate and regular rhythm.   Abdomen:  normal bowel sounds.   Msk:  normal ROM.   Neurologic:  alert & oriented X3.   Skin:  color normal.   Psych:  Oriented X3.     Impression & Recommendations:  Problem # 1:  MAMMOGRAM, ABNORMAL, LEFT (ICD-793.80) results reviewed with pt advised her to apply heat to the affected area f/u in 1 year for f/u.  Problem # 2:  HYPERTENSION (ICD-401.9) Advised pt that she needs to take her lisinopril. New Rx given. DASH diet Her updated  medication list for this problem includes:    Triamterene-hctz 37.5-25 Mg Caps (Triamterene-hctz) .Marland Kitchen... Take one (1) by mouth daily    Lisinopril 5 Mg Tabs (Lisinopril) ..... One tablet by mouth daily for blood pressure  Problem # 3:  CANDIDIASIS (ICD-112.9) will refill the cream  Problem # 4:  HYPERCHOLESTEROLEMIA (ICD-272.0) labs reviewed from last visit will recheck in May 2011  Complete Medication List: 1)  Tegretol 200 Mg Tabs (Carbamazepine) .Marland Kitchen.. 1 tablet by mouth two times a day 2)  Triamterene-hctz 37.5-25 Mg Caps (Triamterene-hctz) .... Take one (1) by mouth daily 3)  Minocycline Hcl 50 Mg Caps (Minocycline hcl) .Marland Kitchen.. 1 tablet by mouth two times a day 4)  Zyprexa 10 Mg Tabs (Olanzapine) .... Take one tablet by mouth daily **rx by [facility]** 5)  Zyprexa 15 Mg Tabs (Olanzapine) .... One tablet by mouth at bedtime **rx by [facility]** 6)  Lisinopril 5 Mg Tabs (Lisinopril) .... One tablet by mouth daily for blood pressure 7)  Nystatin-triamcinolone 100000-0.1 Unit/gm-% Oint (Nystatin-triamcinolone) .... Use to affected area two times a day as needed  Hypertension Assessment/Plan:      The patient's hypertensive risk group is category B: At least one risk factor (excluding diabetes) with no target organ damage.  Her calculated 10 year risk of coronary heart disease is 3 %.  Today's blood pressure is 136/85.  Her blood pressure goal is < 140/90.  Lipid Assessment/Plan:      Based on NCEP/ATP III, the patient's risk factor category is "0-1 risk factors".  The patient's lipid goals are as follows: Total cholesterol goal is 200; LDL cholesterol goal is 160; HDL cholesterol goal is 40; Triglyceride goal is 150.    Patient Instructions: 1)  High blood pressure - You should take the Lisinopril 5mg  by mouth daily along with the Triamterene/HCTZ. 2)  Breast - It is not uncommon for your breast to still be sore after the ultrasound.  Apply warm compresses to the left breast. 3)   Follow up as needed. Prescriptions: LISINOPRIL 5 MG TABS (LISINOPRIL) One tablet by mouth daily for blood pressure  #30 x 5   Entered and Authorized by:   Lehman Prom FNP   Signed by:   Lehman Prom FNP on 06/24/2009   Method used:   Print then Give to Patient   RxID:   [id number] NYSTATIN-TRIAMCINOLONE 100000-0.1 UNIT/GM-% OINT (NYSTATIN-TRIAMCINOLONE) Use to affected area two times a day as needed  #60gm x 0   Entered and Authorized by:   Lehman Prom FNP   Signed by:   Lehman Prom FNP on 06/24/2009   Method used:   Faxed to ...       [facility] (retail)       [address] [place], [place]  [address]  Ph: [phone]       Fax: [phone]   RxID:   [id number] TRIAMTERENE-HCTZ 37.5-25 MG CAPS (TRIAMTERENE-HCTZ) Take one (1) by mouth daily  #90 x 1   Entered and Authorized by:   Lehman Prom FNP   Signed by:   Lehman Prom FNP on 06/24/2009   Method used:   Print then Give to Patient   RxID:   [id number] MINOCYCLINE HCL 50 MG  CAPS (MINOCYCLINE HCL) 1 tablet by mouth two times a day  #60 x 0   Entered and Authorized by:   Lehman Prom FNP   Signed by:   Lehman Prom FNP on 06/24/2009   Method used:   Faxed to ...       [facility] (retail)       [address] [place], [place]  [address]       Ph: [phone]       Fax: [phone]   RxID:   [id number]

## 2010-06-29 NOTE — Assessment & Plan Note (Signed)
Summary: HTN/Bipolar   Vital Signs:  Patient Profile:   50 Years Old Female Height:     63.25 inches Weight:      232 pounds BMI:     40.92 BSA:     2.07 Temp:     97.0 degrees F oral Pulse rate:   72 / minute Pulse rhythm:   regular Resp:     16 per minute BP sitting:   130 / 100  (left arm) Cuff size:   large  Vitals Entered By: Levon Hedger (September 24, 2007 8:44 AM)             Is Patient Diabetic? No  Does patient need assistance? Ambulation Normal Comments pt brought medications     Chief Complaint:  med refills and rash under breast and stomach.  History of Present Illness:  Pt into the office for f/u on HTN. Pt seen earlier this month for hydradentitis supprativa. She was did get monocycline which is helping.  She notes that she has gone to [facility] in the past and she was usually treated with monocycline.  She notes that she did get some biopsy done in the past.  Bipolar - Pt is taking tegretol 200mg  - 2 tablet by mouth daily.  Reports that mood is stable. Pt does not go to the [facility].  she has been on above dose of meds for many years.  no depressive mood or elevated mood. She did initially see Aquilla Solian when she first started coming to this office.  Pt needs a repeat PAP at [facility].  She reports that she had a PAP done back in October but she was notified that insufficient cells did not allow for interpretation.  Rash - under bra and under stomach.  itchy. intermittent.  she has not used any cream to the area.  Hypertension History:      She denies headache, chest pain, palpitations, and peripheral edema.  Further comments include: She does not check her blood pressure outside of the office. Pt takes the meds at night. Last dose was last night at 10:30PM.  She does get up several times at night for the bathroom. Advised pt that she should take meds in the morning. no problem with calf pain or cramps. banans  very infrequently.        Positive major cardiovascular risk factors include hypertension.  Negative major cardiovascular risk factors include female age less than 18 years old and non-tobacco-user status.        Further assessment for target organ damage reveals no history of ASHD, cardiac end-organ damage (CHF/LVH), stroke/TIA, peripheral vascular disease, renal insufficiency, or hypertensive retinopathy.       Current Allergies: No known allergies     Risk Factors:  Tobacco use:  never Drug use:  no Alcohol use:  no Seatbelt use:  100 % Sun Exposure:  occasionally  Mammogram History:     Date of Last Mammogram:  05/16/2007    Results:  normal   PAP Smear History:     Date of Last PAP Smear:  02/28/2007    Results:  insufficient cells for interpretation - pt to f/u in April for repeat PAP    Review of Systems  Derm      Complains of rash.  Psych      Denies depression.      bipolar but stable   Physical Exam  General:     alert.   Head:  normocephalic.   Eyes:     pupils round.   Mouth:     fair dentition.   Lungs:     normal breath sounds.   Heart:     normal rate and regular rhythm.   Abdomen:     soft, non-tender, and normal bowel sounds.   Msk:     normal ROM.   Neurologic:     alert & oriented X3.   Skin:     under breast and pannus with hyperpigented, excoriated rash Psych:     Oriented X3.      Impression & Recommendations:  Problem # 1:  CANDIDIASIS (ICD-112.9) will give ointment to apply to affected areas under breast and pannus. may be due in part to recent oral antibiotics.  Problem # 2:  BIPOLAR DISORDER UNSPECIFIED (ICD-296.80) continue tegretol. stable.  Problem # 3:  HYPERTENSION (ICD-401.9) pt has not taken blood pressure meds today.  Advised her to take daily in morning.  check blood pressure outside of office. Her updated medication list for this problem includes:    Triamterene-hctz 37.5-25 Mg Caps  (Triamterene-hctz) .Marland Kitchen... 1 tablet by mouth daily for blood pressure   Complete Medication List: 1)  Tegretol 200 Mg Tabs (Carbamazepine) .... 2 tablets by mouth at night 2)  Triamterene-hctz 37.5-25 Mg Caps (Triamterene-hctz) .Marland Kitchen.. 1 tablet by mouth daily for blood pressure 3)  Minocycline Hcl 50 Mg Tabs (Minocycline hcl) .... 2 tablet by mouth two times a day 4)  Nystatin-triamcinolone 100000-0.1 Unit/gm-% Oint (Nystatin-triamcinolone) .Marland Kitchen.. 1 application topically two times a day to affected area  Hypertension Assessment/Plan:      The patient's hypertensive risk group is category A: No risk factors and no target organ damage.  Her calculated 10 year risk of coronary heart disease is 5 %.  Today's blood pressure is 130/100.  Her blood pressure goal is < 140/90.   Patient Instructions: 1)  Check blood pressure when you go to [employer] or other venues with blood pressure machine. 2)  Start taking the blood pressure medications at night. 3)  Use cream to affected areas    Prescriptions: MINOCYCLINE HCL 50 MG  TABS (MINOCYCLINE HCL) 2 tablet by mouth two times a day  #40 x 0   Entered and Authorized by:   Lehman Prom FNP   Signed by:   Lehman Prom FNP on 09/24/2007   Method used:   Printed then faxed to ...       [facility]       [address]       [place], [place]  [address]       Ph: [phone]       Fax: [phone]   RxID:   [id number] NYSTATIN-TRIAMCINOLONE 100000-0.1 UNIT/GM-%  OINT (NYSTATIN-TRIAMCINOLONE) 1 application topically two times a day to affected area  #60gm x 1   Entered and Authorized by:   Lehman Prom FNP   Signed by:   Lehman Prom FNP on 09/24/2007   Method used:   Printed then faxed to ...       [facility]*       [address]       [place], [place]  [address]       Ph: [phone]       Fax: [phone]   RxID:   [id number]  ]

## 2010-06-29 NOTE — Assessment & Plan Note (Signed)
Summary: Hidraenitis Suppurativa   Vital Signs:  Patient Profile:   50 Years Old Female LMP:     10/15/2007 Height:     63.25 inches Weight:      232 pounds BMI:     40.92 BSA:     2.07 Temp:     97.7 degrees F oral Pulse rate:   80 / minute Pulse rhythm:   regular Resp:     20 per minute BP sitting:   120 / 90  (left arm) Cuff size:   large  Pt. in pain?   no  Vitals Entered By: Levon Hedger (October 29, 2007 9:07 AM)  Menstrual History: LMP (date): 10/15/2007 LMP - Character: normal              Is Patient Diabetic? No  Does patient need assistance? Ambulation Normal     History of Present Illness:  Pt into the office still with some complaints of boils under arms. Pt was going to [facility] in May 2008.  She was seeing Dr. Alanson Puls. She reports that she has recurrent boils and was previously on antibiotics. She did take the Bactrim for 10 days as ordered.  Pt was not clear that she was not to take both antibiotics at the same time. She was without a flare for many months and then it restarted about 3 months ago.   No change in deordarants or soaps. Admits to some new stressors.     Updated Prior Medication List: TEGRETOL 200 MG  TABS (CARBAMAZEPINE) 2 tablets by mouth at night TRIAMTERENE-HCTZ 37.5-25 MG  CAPS (TRIAMTERENE-HCTZ) 1 tablet by mouth daily for blood pressure NYSTATIN-TRIAMCINOLONE 100000-0.1 UNIT/GM-%  OINT (NYSTATIN-TRIAMCINOLONE) 1 application topically two times a day to affected area  Current Allergies (reviewed today): No known allergies     Risk Factors: Tobacco use:  never Drug use:  no Alcohol use:  no Seatbelt use:  100 % Sun Exposure:  occasionally  Mammogram History:    Date of Last Mammogram:  05/16/2007  PAP Smear History:    Date of Last PAP Smear:  02/28/2007   Review of Systems  Derm      under arm - boils   Physical Exam  General:     alert and overweight-appearing.   Head:  normocephalic.   Lungs:     normal breath sounds.   Heart:     normal rate and regular rhythm.   Abdomen:     normal bowel sounds.   Msk:     up to exam table Neurologic:     alert & oriented X3.   Skin:     bil underarm with hyperpigmented skin - some inflammed papules, tenderness with palpations Psych:     Oriented X3.      Impression & Recommendations:  Problem # 1:  HIDRADENITIS SUPPURATIVA (ICD-705.83) Will re-start on minocycline. will refer to dermatology.  will attempt to see if she can return to [facility]. Orders: Dermatology Referral (Derma)   Complete Medication List: 1)  Tegretol 200 Mg Tabs (Carbamazepine) .... 2 tablets by mouth at night 2)  Triamterene-hctz 37.5-25 Mg Caps (Triamterene-hctz) .Marland Kitchen.. 1 tablet by mouth daily for blood pressure 3)  Nystatin-triamcinolone 100000-0.1 Unit/gm-% Oint (Nystatin-triamcinolone) .Marland Kitchen.. 1 application topically two times a day to affected area 4)  Minocycline Hcl 50 Mg Caps (Minocycline hcl) .Marland Kitchen.. 1 tablet by mouth two times a day   Patient Instructions: 1)  You need to be sure to use antibacterial soap.  2)  You may also get Betasept scrub to apply to affected areas two times a day.  Let sit for 5 minutes, rinse. 3)  You will be refered to dermatolgy for evaluation. 4)  Call for a follow up appointment as needed 5)  You may look up Hidradenitis Suppurativa for more information   Prescriptions: MINOCYCLINE HCL 50 MG  CAPS (MINOCYCLINE HCL) 1 tablet by mouth two times a day  #60 x 0   Entered and Authorized by:   Lehman Prom FNP   Signed by:   Lehman Prom FNP on 10/29/2007   Method used:   Print then Give to Patient   RxID:   [id number]  ]

## 2010-06-29 NOTE — Letter (Signed)
Summary: Lipid Letter  [facility]  [address] [place], [place] [address]   Phone: [phone]  Fax: [phone]    05/03/2010  Stacie Daugherty [address] [place], [place]  [address]  Dear Stacie Daugherty:  We have carefully reviewed your last lipid profile from 04/29/2010 and the results are noted below with a summary of recommendations for lipid management.    Cholesterol:       203     Goal: less than 200   HDL "good" Cholesterol:   65     Goal: greater than 40   LDL "bad" Cholesterol:   125     Goal: less than 100   Triglycerides:       65     Goal: less than 150    Labs done during recent office visit shows that your cholesterol is slightly elevated but everything else is ok.    Current Medications: 1)    Tegretol 200 Mg  Tabs (Carbamazepine) .Marland Kitchen.. 1 tablet by mouth two times a day 2)    Triamterene-hctz 37.5-25 Mg Tabs (Triamterene-hctz) .... Take 1 tablet by mouth once a day 3)    Minocycline Hcl 50 Mg  Caps (Minocycline hcl) .Marland Kitchen.. 1 tablet by mouth two times a day 4)    Zyprexa 10 Mg Tabs (Olanzapine) .... **rx by [facility]** 5)    Lisinopril 5 Mg Tabs (Lisinopril) .... Hold 6)    Clotrimazole 1 % Crea (Clotrimazole) .... Use to affected area two times a day until healed 7)    Trazodone Hcl 100 Mg Tabs (Trazodone hcl) .... Rx per [facility] 8)    Ibuprofen 800 Mg Tabs (Ibuprofen) .... One tablet by mouth two times a day for knee pain  If you have any questions, please call. We appreciate being able to work with you.   Sincerely,    [facility] Lehman Prom FNP

## 2010-06-29 NOTE — Progress Notes (Signed)
Summary: offce visit  Phone Note Call from Patient Call back at Home Phone [phone]   Caller: Patient Summary of Call: the patient recently re-establish with [place] because she doesn't has any insurance and need an office visit for bp check, bipolar medications. She was previously patient of Dr. Barbaraann Barthel  Initial call taken by: Manon Hilding,  April 11, 2007 12:08 PM  Follow-up for Phone Call        appt scheduled. Follow-up by: Mikey College CMA,  April 17, 2007 8:44 AM

## 2010-06-29 NOTE — Assessment & Plan Note (Signed)
Summary: Complete Physical Exam   Vital Signs:  Patient Profile:   50 Years Old Female LMP:     12/09/2007 Height:     63.25 inches Weight:      233 pounds BMI:     41.10 BSA:     2.07 Temp:     97.3 degrees F oral Pulse rate:   96 / minute Pulse rhythm:   regular Resp:     20 per minute BP sitting:   130 / 80  (left arm) Cuff size:   large  Pt. in pain?   yes    Location:   under arms   Vitals Entered By: Levon Hedger (December 19, 2007 2:42 PM)  Menstrual History: LMP (date): 12/09/2007              Is Patient Diabetic? No  Does patient need assistance? Ambulation Normal     Chief Complaint:  CPP.  History of Present Illness:  Pt into the office for complete phsyical exam.  PAP - done at other provider's office in 11/08. insufficient cells and pt to was to repeat in 4/09 but she did not get it repeated.  She did have abnormal PAP several years ago.  Only repeated PAP and it was normal. no procedures. No family hx of cervical or ovarian cancer. 1 child.  natural delivery. No current birth control. Regular menses monthly.  mammogram - normal. last done 11/08  no family hx of breast cancer.  No self breast exam at home.  dental - last appt about 3-4 ago.  She was sent through [facility].  optho - last about 1.5 yrs ago. no problems with vision at this time. no glasses or contacts.  Stool - regular.  no family hx of colon cancer. no blood noted in stool.  Hidranitis supprativa - pt has an appt to go to [facility] pharmacy. She has been on minocyline prophylaxis to prevent outbreaks.  Pt also reports that husband and son has outbreaks and problems with skin    Current Allergies (reviewed today): No known allergies     Risk Factors: Tobacco use:  never Drug use:  no Alcohol use:  no Seatbelt use:  100 % Sun Exposure:  occasionally  Mammogram History:    Date of Last Mammogram:  05/16/2007  PAP Smear History:    Date of Last PAP Smear:  02/28/2007   Review  of Systems  General      Denies loss of appetite.  Eyes      Denies blurring.  ENT      Denies earache.  CV      Denies fatigue.  Resp      Denies cough.  GI      Denies abdominal pain.  GU      Denies dysuria.  MS      Denies joint pain.  Derm      Complains of poor wound healing.      bil axillla  Neuro      Denies headaches.  Psych      Denies depression.   Physical Exam  General:     alert and overweight-appearing.   Head:     normocephalic.   Eyes:     pupils round.   Ears:     R ear normal and L ear normal.   Nose:     External nasal examination shows no deformity or inflammation. Nasal mucosa are pink and moist without lesions or exudates. Mouth:  pharynx pink and moist and fair dentition.   Neck:     supple.   Lungs:     normal breath sounds.   Heart:     normal rate, regular rhythm, no murmur, no gallop, and no rub.   Abdomen:     soft, non-tender, normal bowel sounds, no hepatomegaly, and no splenomegaly.   Rectal:     no external abnormalities.   Msk:     up to exam table without assist Pulses:     R radial normal, R dorsalis pedis normal, L radial normal, and L dorsalis pedis normal.   Extremities:     trace left pedal edema and trace right pedal edema.   Neurologic:     alert & oriented X3, cranial nerves II-XII intact, and strength normal in all extremities.   Skin:     bil axilla with discolored, hyperpigmented skin some tender nodules Psych:     Oriented X3.    Pelvic Exam  Vulva:      normal appearance.   Urethra and Bladder:      Urethra--normal.   Vagina:      physiologic discharge.   Cervix:      no CMT, anterior, parous.   Adnexa:      nontender bilaterally.   Rectum:      normal, heme negative stool.       Impression & Recommendations:  Problem # 1:  Gynecological examination-routine (ICD-V72.31) PAP done. labs up to date. will order HIV and RPR will order mammogram in 10/09.  self breast exam  placcard given maintain routine optho and dental exam guaiac done EKG done  Problem # 2:  HIDRADENITIS SUPPURATIVA (ICD-705.83) pt has appt with dermatology.    Complete Medication List: 1)  Tegretol 200 Mg Tabs (Carbamazepine) .... 2 tablets by mouth at night 2)  Triamterene-hctz 37.5-25 Mg Caps (Triamterene-hctz) .Marland Kitchen.. 1 tablet by mouth daily for blood pressure 3)  Nystatin-triamcinolone 100000-0.1 Unit/gm-% Oint (Nystatin-triamcinolone) .Marland Kitchen.. 1 application topically two times a day to affected area 4)  Minocycline Hcl 50 Mg Caps (Minocycline hcl) .Marland Kitchen.. 1 tablet by mouth two times a day  Other Orders: EKG w/ Interpretation (93000) UA Dipstick w/o Micro (manual) (42595) Hemoccult Guaiac-1 spec.(in office) (82272) KOH/ WET Mount [phone]) Pap Smear, Thin Prep ( Collection of) [phone]) T- GC Chlamydia (95188) T-HIV Antibody  (Reflex) ([id number]) T-Syphilis Test (RPR) (01093-23557) T-Urine Microalbumin w/creat. ratio [phone] / [id number])   Patient Instructions: 1)  Your mammogram will be due in October.  You need to remind this provider when it is due. 2)  Keep appointment with dermatology as ordered. 3)  Labs done today HIV and RPR (not previously done).  You will be notified of results.   ] Laboratory Results   Urine Tests  Date/Time Received: December 19, 2007 3:00 PM  Date/Time Reported: December 19, 2007 3:00 PM   Routine Urinalysis   Color: Stacie Daugherty Glucose: negative   (Normal Range: Negative) Bilirubin: negative   (Normal Range: Negative) Ketone: negative   (Normal Range: Negative) Spec. Gravity: >=1.030   (Normal Range: 1.003-1.035) Blood: negative   (Normal Range: Negative) pH: 5.0   (Normal Range: 5.0-8.0) Protein: negative   (Normal Range: Negative) Urobilinogen: 0.2   (Normal Range: 0-1) Nitrite: negative   (Normal Range: Negative) Leukocyte Esterace: negative   (Normal Range: Negative)    Date/Time Received: December 19, 2007 4:09 PM   Wet Mount/KOH Source: vaginal  WBC/hpf: 1-5 Bacteria/hpf: rare Clue cells/hpf: none Yeast/hpf: none Trichomonas/hpf: none  Stool - Occult Blood Hemmoccult #1: negative Date: 12/19/2007   Laboratory Results   Urine Tests    Routine Urinalysis   Color: Stacie Daugherty Glucose: negative   (Normal Range: Negative) Bilirubin: negative   (Normal Range: Negative) Ketone: negative   (Normal Range: Negative) Spec. Gravity: >=1.030   (Normal Range: 1.003-1.035) Blood: negative   (Normal Range: Negative) pH: 5.0   (Normal Range: 5.0-8.0) Protein: negative   (Normal Range: Negative) Urobilinogen: 0.2   (Normal Range: 0-1) Nitrite: negative   (Normal Range: Negative) Leukocyte Esterace: negative   (Normal Range: Negative)      Wet Mount/KOH KOH Negative     Appended Document: Hemoccult results  Laboratory Results    Stool - Occult Blood Hemmoccult #1: negative Date: 12/28/2007 Hemoccult #2: negative Date: 12/28/2007 Hemoccult #3: negative Date: 12/28/2007

## 2010-06-29 NOTE — Letter (Signed)
Summary: DENTAL REFERRAL  DENTAL REFERRAL   Imported By: Arta Bruce 06/07/2007 13:57:13  _____________________________________________________________________  External Attachment:    Type:   Image     Comment:   External Document

## 2010-06-29 NOTE — Assessment & Plan Note (Signed)
Summary: PT DR Daphine Deutscher / RIGHT ARM SWOLLEN  / NS              Comments appt scanned into EMR       Current Allergies: No known allergies         Complete Medication List: 1)  Tegretol 200 Mg Tabs (Carbamazepine) .... 2 tablets by mouth at night 2)  Triamterene-hctz 37.5-25 Mg Caps (Triamterene-hctz) .Marland Kitchen.. 1 tablet by mouth daily for blood pressure     ]

## 2010-06-29 NOTE — Progress Notes (Signed)
Summary: armpit issues  Phone Note Call from Patient Call back at Home Phone [phone]   Caller: Patient Call For: [phone] Summary of Call: The patient is really very concern with her knots under her armpit.  Prmarly, appears just on the right armpit but now both armpit has the same problem; therefore, she is requesting for a dermatology referral that can help her.  The pt may think that monocicline is not helping her at all.  She also has some boils in other areas of her body. FNP Daphine Deutscher Initial call taken by: Manon Hilding,  Oct 05, 2007 11:44 AM  Follow-up for Phone Call        Areas may be resistant to current antibiotic.   Advise her that she needs to take another antibiotic - bactrim ds two times a day x 10 days.  she also should wash with an antibacterial cleanser such as betasept or chlorhexadine either can be purchased over the counter at the pharamcy. **Med faxed to pharmacy** Follow-up by: Lehman Prom FNP,  Oct 05, 2007 4:42 PM  Additional Follow-up for Phone Call Additional follow up Details #1::        Phone call complete Additional Follow-up by: Levon Hedger,  Oct 10, 2007 10:50 AM    New/Updated Medications: BACTRIM DS 800-160 MG  TABS (SULFAMETHOXAZOLE-TRIMETHOPRIM) 1 tablet by mouth two times a day for infection   Prescriptions: BACTRIM DS 800-160 MG  TABS (SULFAMETHOXAZOLE-TRIMETHOPRIM) 1 tablet by mouth two times a day for infection  #20 x 0   Entered and Authorized by:   Lehman Prom FNP   Signed by:   Lehman Prom FNP on 10/05/2007   Method used:   Electronically sent to ...       [facility]*       [address]       [place], [place]  [address]       Ph: [phone]       Fax: [phone]   RxID:   [id number]

## 2010-06-29 NOTE — Miscellaneous (Signed)
Summary: Hx - Med refill  Clinical Lists Changes Pt was inpt in psych ward in [facility]. She was started on multiple meds. will give pt 2 weeks worth of meds. Marchelle Folks will work to get pt into [facility] within the next 2 weeks. n.martin, fnp Medications: Added new medication of ZYPREXA 10 MG TABS (OLANZAPINE) Take one tablet by mouth daily - Signed Added new medication of ZYPREXA 15 MG TABS (OLANZAPINE) One tablet by mouth at bedtime - Signed Added new medication of CLONIDINE HCL 0.2 MG TABS (CLONIDINE HCL) 1 tablet by mouth two times a day for high blood pressure - Signed Changed medication from TEGRETOL 200 MG  TABS (CARBAMAZEPINE) 2 tablets by mouth at night to TEGRETOL 200 MG  TABS (CARBAMAZEPINE) 1 tablet by mouth two times a day - Signed Added new medication of TRAZODONE HCL 50 MG TABS (TRAZODONE HCL) 1 tablet by mouth at bedtime for insomnia - Signed Rx of ZYPREXA 10 MG TABS (OLANZAPINE) Take one tablet by mouth daily;  #14 x 0;  Signed;  Entered by: Lehman Prom FNP;  Authorized by: Lehman Prom FNP;  Method used: Print then Give to Patient Rx of ZYPREXA 15 MG TABS (OLANZAPINE) One tablet by mouth at bedtime;  #14 x 0;  Signed;  Entered by: Lehman Prom FNP;  Authorized by: Lehman Prom FNP;  Method used: Print then Give to Patient Rx of CLONIDINE HCL 0.2 MG TABS (CLONIDINE HCL) 1 tablet by mouth two times a day for high blood pressure;  #28 x 0;  Signed;  Entered by: Lehman Prom FNP;  Authorized by: Lehman Prom FNP;  Method used: Print then Give to Patient Rx of TEGRETOL 200 MG  TABS (CARBAMAZEPINE) 1 tablet by mouth two times a day;  #14 x 0;  Signed;  Entered by: Lehman Prom FNP;  Authorized by: Lehman Prom FNP;  Method used: Print then Give to Patient Rx of TRAZODONE HCL 50 MG TABS (TRAZODONE HCL) 1 tablet by mouth at bedtime for insomnia;  #14 x 0;  Signed;  Entered by: Lehman Prom FNP;  Authorized by: Lehman Prom FNP;  Method used: Print  then Give to Patient    Prescriptions: TRAZODONE HCL 50 MG TABS (TRAZODONE HCL) 1 tablet by mouth at bedtime for insomnia  #14 x 0   Entered and Authorized by:   Lehman Prom FNP   Signed by:   Lehman Prom FNP on 06/06/2008   Method used:   Print then Give to Patient   RxID:   [id number] TEGRETOL 200 MG  TABS (CARBAMAZEPINE) 1 tablet by mouth two times a day  #14 x 0   Entered and Authorized by:   Lehman Prom FNP   Signed by:   Lehman Prom FNP on 06/06/2008   Method used:   Print then Give to Patient   RxID:   [id number] CLONIDINE HCL 0.2 MG TABS (CLONIDINE HCL) 1 tablet by mouth two times a day for high blood pressure  #28 x 0   Entered and Authorized by:   Lehman Prom FNP   Signed by:   Lehman Prom FNP on 06/06/2008   Method used:   Print then Give to Patient   RxID:   [id number] ZYPREXA 15 MG TABS (OLANZAPINE) One tablet by mouth at bedtime  #14 x 0   Entered and Authorized by:   Lehman Prom FNP   Signed by:   Lehman Prom FNP on 06/06/2008   Method used:   Print then Give  to Patient   RxID:   [id number] ZYPREXA 10 MG TABS (OLANZAPINE) Take one tablet by mouth daily  #14 x 0   Entered and Authorized by:   Lehman Prom FNP   Signed by:   Lehman Prom FNP on 06/06/2008   Method used:   Print then Give to Patient   RxID:   [id number]

## 2010-07-01 LAB — CONVERTED CEMR LAB
Chlamydia, DNA Probe: NEGATIVE
GC Probe Amp, Genital: NEGATIVE
Microalb, Ur: 1.13 mg/dL (ref 0.00–1.89)

## 2010-07-01 NOTE — Letter (Signed)
Summary: Historic Patient File//DEPRESSION SCREENING  Historic Patient File//DEPRESSION SCREENING   Imported By: Arta Bruce 06/24/2010 13:36:50  _____________________________________________________________________  External Attachment:    Type:   Image     Comment:   External Document

## 2010-07-01 NOTE — Assessment & Plan Note (Signed)
Summary: Complete Physical Exam   Vital Signs:  Patient profile:   50 year old female Menstrual status:  regular LMP:     05/26/2010 Weight:      218.3 pounds Temp:     97.1 degrees F oral Pulse rate:   70 / minute Pulse rhythm:   regular Resp:     16 per minute BP sitting:   110 / 80  (left arm) Cuff size:   regular  Vitals Entered By: Levon Hedger (June 24, 2010 11:01 AM) CC: CPP Is Patient Diabetic? No Pain Assessment Patient in pain? no       Does patient need assistance? Functional Status Self care Ambulation Normal  Vision Screening:Left eye w/o correction: 20 / 25-1 Right Eye w/o correction: 20 / 20-1 Both eyes w/o correction:  20/ 20       LMP (date): 05/26/2010 LMP - Character: normal     Menstrual flow (days): 4 Enter LMP: 05/26/2010 Last PAP Result NEGATIVE FOR INTRAEPITHELIAL LESIONS OR MALIGNANCY.   CC:  CPP.  History of Present Illness:  Pt into the office for a complete physcial exam  Mammogram - done recently with biopsy.  Final results ok.  Next mammogram due in 1 year.  PAP - last done 1 year ago Still with menses monthly.   1 child married  Optho - no current glasses or contacts  Dental - no recent dental exam  Habits & Providers  Alcohol-Tobacco-Diet     Alcohol drinks/day: 0     Tobacco Status: never  Exercise-Depression-Behavior     Does Patient Exercise: no     Depression Counseling: not indicated; screening negative for depression     Drug Use: no     Seat Belt Use: 100     Sun Exposure: occasionally  Comments: PHQ-9 score  = 23  Allergies (verified): No Known Drug Allergies  Review of Systems General:  Denies fever. Eyes:  Denies blurring; +just getting rid of a stye on her left eye; applied warm compresses until it resolved. currently feels like one is developing in her right eye. ENT:  Denies earache. CV:  Denies chest pain or discomfort. Resp:  Denies cough. GI:  Denies abdominal pain, nausea,  and vomiting. GU:  Denies discharge. MS:  Denies joint pain. Derm:  Denies dryness. Neuro:  Denies headaches. Psych:  Complains of depression.  Physical Exam  General:  alert.  obses Head:  normocephalic.   Eyes:  pupils round.   Ears:  bil TM with bony landmarks present Nose:  no nasal discharge.   Mouth:  fair dentition.   Neck:  supple.   Chest Wall:  no mass.   Breasts:  no abnormal thickening.   Lungs:  normal breath sounds.   Heart:  normal rate and regular rhythm.   Abdomen:  soft, non-tender, and normal bowel sounds.   Rectal:  normal, heme negative stool Msk:  up to the exam table Pulses:  R radial normal and L radial normal.   Extremities:  no edema Neurologic:  alert & oriented X3.   Skin:  color normal.   Psych:  Oriented X3.    Pelvic Exam  Vulva:      normal appearance.   Urethra and Bladder:      Urethra--normal.   Vagina:      physiologic discharge.   Cervix:      midposition.   Uterus:      smooth.   Adnexa:      nontender  bilaterally.   Rectum:      heme negative stool.      Impression & Recommendations:  Problem # 1:  ROUTINE GYNECOLOGICAL EXAMINATION (ICD-V72.31) PHQ- 0 score = 23 (pt is still going to mental health) labs done during recent visit rec optho and dental exam Mammogram recently done Orders: KOH/ WET Mount [phone]) Pap Smear, Thin Prep ( Collection of) [phone]) T- GC Chlamydia (09811) Hemoccult Guaiac-1 spec.(in office) (82270) Vision Screening (91478)  Problem # 2:  HYPERTENSION (ICD-401.9) BP is doing well pt is still not taking lisinopril DASH diet Her updated medication list for this problem includes:    Triamterene-hctz 37.5-25 Mg Tabs (Triamterene-hctz) .Marland Kitchen... Take 1 tablet by mouth once a day    Lisinopril 5 Mg Tabs (Lisinopril) ..... Hold  Orders: T-Urine Microalbumin w/creat. ratio [phone]) UA Dipstick w/o Micro (manual) (69629) EKG w/ Interpretation (93000)  Problem # 3:  OBESITY  (ICD-278.00) continued to encourage pt to increase exercise  Problem # 4:  HYPERCHOLESTEROLEMIA (ICD-272.0) reviewed labs with pt  Complete Medication List: 1)  Triamterene-hctz 37.5-25 Mg Tabs (Triamterene-hctz) .... Take 1 tablet by mouth once a day 2)  Minocycline Hcl 50 Mg Caps (Minocycline hcl) .Marland Kitchen.. 1 tablet by mouth two times a day 3)  Zyprexa 10 Mg Tabs (Olanzapine) .... **rx by [facility]** 4)  Lisinopril 5 Mg Tabs (Lisinopril) .... Hold 5)  Clotrimazole 1 % Crea (Clotrimazole) .... Use to affected area two times a day until healed 6)  Trazodone Hcl 100 Mg Tabs (Trazodone hcl) .... Rx per [facility] 7)  Ibuprofen 800 Mg Tabs (Ibuprofen) .... One tablet by mouth two times a day for knee pain  Patient Instructions: 1)  Blood pressure - GREAT today. Please continue to take your medications as ordered. 2)  You have declined the flu vaccine today.  If you change your mind then you can schedule a nurse visit. 3)  Follow up in 6 months for blood pressure. Prescriptions: MINOCYCLINE HCL 50 MG  CAPS (MINOCYCLINE HCL) 1 tablet by mouth two times a day  #60 x 1   Entered and Authorized by:   Lehman Prom FNP   Signed by:   Lehman Prom FNP on 06/24/2010   Method used:   Print then Give to Patient   RxID:   [id number] TRIAMTERENE-HCTZ 37.5-25 MG TABS (TRIAMTERENE-HCTZ) Take 1 tablet by mouth once a day  #90 x 3   Entered and Authorized by:   Lehman Prom FNP   Signed by:   Lehman Prom FNP on 06/24/2010   Method used:   Print then Give to Patient   RxID:   [id number]    Orders Added: 1)  Est. Patient age 43-64 [99396] 2)  KOH/ WET Mount [phone] 3)  Pap Smear, Thin Prep ( Collection of) [Q0091] 4)  T- GC Chlamydia [33295] 5)  T-Urine Microalbumin w/creat. ratio [[id number]] 6)  UA Dipstick w/o Micro (manual) [81002] 7)  Hemoccult Guaiac-1 spec.(in office) [82270] 8)  Vision Screening [99173] 9)  EKG w/ Interpretation  [93000]    Prevention & Chronic Care Immunizations   Influenza vaccine: none available in this office - pt advised to check local pharmacy  (06/20/2008)   Influenza vaccine deferral: Refused  (04/27/2009)    Tetanus booster: 05/30/2005: per pt at previous provider    Pneumococcal vaccine: Not documented  Other Screening   Pap smear: NEGATIVE FOR INTRAEPITHELIAL LESIONS OR MALIGNANCY.  (04/28/2009)   Pap smear action/deferral: Ordered  (04/27/2009)   Pap smear due:  04/2010    Mammogram: [address].6^EA BREAST SURGICAL SPECIMEN  (06/17/2010)   Mammogram action/deferral: Ordered  (04/27/2009)   Smoking status: never  (06/24/2010)  Lipids   Total Cholesterol: 203  (04/29/2010)   Lipid panel action/deferral: Lipid Panel ordered   LDL: 125  (04/29/2010)   LDL Direct: Not documented   HDL: 65  (04/29/2010)   Triglycerides: 65  (04/29/2010)    SGOT (AST): 16  (04/29/2010)   SGPT (ALT): 12  (04/29/2010)   Alkaline phosphatase: 45  (04/29/2010)   Total bilirubin: 0.4  (04/29/2010)  Hypertension   Last Blood Pressure: 110 / 80  (06/24/2010)   Serum creatinine: 0.72  (04/29/2010)   BMP action: Ordered   Serum potassium 4.7  (04/29/2010)  Self-Management Support :   Personal Goals (by the next clinic visit) :      Personal blood pressure goal: 130/80  (04/27/2009)   Hypertension self-management support: Not documented    Lipid self-management support: Not documented    Laboratory Results   Urine Tests  Date/Time Received: June 24, 2010 12:13 PM   Routine Urinalysis   Color: cloudy Glucose: negative   (Normal Range: Negative) Bilirubin: negative   (Normal Range: Negative) Ketone: negative   (Normal Range: Negative) Spec. Gravity: >=1.030   (Normal Range: 1.003-1.035) Blood: trace-intact   (Normal Range: Negative) pH: 5.0   (Normal Range: 5.0-8.0) Protein: negative   (Normal Range: Negative) Urobilinogen: 0.2   (Normal Range: 0-1) Nitrite: negative   (Normal  Range: Negative) Leukocyte Esterace: negative   (Normal Range: Negative)    Date/Time Received: June 24, 2010   Stool - Occult Blood Hemmoccult #1: negative Date: 06/24/2010     EKG  Procedure date:  06/24/2010  Findings:      sinus brady

## 2010-07-15 NOTE — Progress Notes (Signed)
Summary: PAP results  Phone Note Outgoing Call   Summary of Call: notify pt that her PAP was negative for any cancer cells but she did have a yeast infection Rx for fluconazole in the basket - fax to pt's pharmacy advise her to wear white cotton underwear, incorporate yogurt in her diet. pt does take antibiotics frequently so this perhaps is a cause Initial call taken by: Lehman Prom FNP,  June 28, 2010 4:11 PM  Follow-up for Phone Call        Stacie Daugherty  June 29, 2010 3:58 PM Left message on machine for pt to return call to the office.  Stacie Daugherty  July 05, 2010 9:54 AM Left message on machine for pt to return call to the office.  pt informed of above.  Rx faxed to [facility] pharmacy Follow-up by: Stacie Daugherty,  July 06, 2010 1:15 PM    New/Updated Medications: FLUCONAZOLE 150 MG TABS (FLUCONAZOLE) One tablet by mouth x 1 dose Prescriptions: FLUCONAZOLE 150 MG TABS (FLUCONAZOLE) One tablet by mouth x 1 dose  #1 x 0   Entered and Authorized by:   Lehman Prom FNP   Signed by:   Lehman Prom FNP on 06/28/2010   Method used:   Printed then faxed to ...       [facility] (retail)       [address]       [place], [place]  [address]       Ph: [phone]       Fax: [phone]   RxID:   [id number]

## 2011-02-23 ENCOUNTER — Inpatient Hospital Stay (INDEPENDENT_AMBULATORY_CARE_PROVIDER_SITE_OTHER)
Admission: RE | Admit: 2011-02-23 | Discharge: 2011-02-23 | Disposition: A | Discharge status: Home / Self Care | Payer: Self-pay | Source: Ambulatory Visit | Attending: Family Medicine | Admitting: Family Medicine

## 2011-02-23 DIAGNOSIS — R059 Cough, unspecified: Secondary | ICD-10-CM

## 2011-02-23 DIAGNOSIS — J309 Allergic rhinitis, unspecified: Secondary | ICD-10-CM

## 2011-02-23 DIAGNOSIS — R05 Cough: Secondary | ICD-10-CM

## 2011-06-07 ENCOUNTER — Other Ambulatory Visit: Payer: Self-pay | Admitting: Family Medicine

## 2011-06-09 ENCOUNTER — Other Ambulatory Visit (HOSPITAL_COMMUNITY): Payer: Self-pay | Admitting: Physician Assistant

## 2011-06-09 DIAGNOSIS — Z1231 Encounter for screening mammogram for malignant neoplasm of breast: Secondary | ICD-10-CM

## 2011-06-21 ENCOUNTER — Ambulatory Visit (HOSPITAL_COMMUNITY)
Admission: RE | Admit: 2011-06-21 | Discharge: 2011-06-21 | Disposition: A | Discharge status: Home / Self Care | Payer: Self-pay | Source: Ambulatory Visit | Attending: Physician Assistant | Admitting: Physician Assistant

## 2011-06-21 DIAGNOSIS — Z1231 Encounter for screening mammogram for malignant neoplasm of breast: Secondary | ICD-10-CM | POA: Insufficient documentation

## 2012-02-22 ENCOUNTER — Encounter (HOSPITAL_COMMUNITY): Payer: Self-pay

## 2012-02-22 ENCOUNTER — Emergency Department (INDEPENDENT_AMBULATORY_CARE_PROVIDER_SITE_OTHER)
Admission: EM | Admit: 2012-02-22 | Discharge: 2012-02-22 | Disposition: A | Discharge status: Home / Self Care | Payer: Self-pay | Source: Home / Self Care | Attending: Family Medicine | Admitting: Family Medicine

## 2012-02-22 DIAGNOSIS — L259 Unspecified contact dermatitis, unspecified cause: Secondary | ICD-10-CM

## 2012-02-22 DIAGNOSIS — I10 Essential (primary) hypertension: Secondary | ICD-10-CM

## 2012-02-22 DIAGNOSIS — Z760 Encounter for issue of repeat prescription: Secondary | ICD-10-CM

## 2012-02-22 DIAGNOSIS — L309 Dermatitis, unspecified: Secondary | ICD-10-CM

## 2012-02-22 HISTORY — DX: Essential (primary) hypertension: I10

## 2012-02-22 HISTORY — DX: Bipolar disorder, unspecified: F31.9

## 2012-02-22 MED ORDER — TRIAMTERENE-HCTZ 37.5-25 MG PO TABS: 1.0000 | ORAL_TABLET | Freq: Every day | ORAL | Status: DC

## 2012-02-22 MED ORDER — TRIAMCINOLONE ACETONIDE 0.1 % EX CREA: TOPICAL_CREAM | Freq: Two times a day (BID) | CUTANEOUS | Status: DC

## 2012-02-22 MED ORDER — LORATADINE 10 MG PO TABS: 10.0000 mg | ORAL_TABLET | Freq: Every day | ORAL | Status: DC

## 2012-02-22 NOTE — ED Provider Notes (Signed)
History     CSN: [id number]  Arrival date & time 02/22/12  1349   First MD Initiated Contact with Patient 02/22/12 1404      Chief Complaint  Patient presents with  . Rash    (Consider location/radiation/quality/duration/timing/severity/associated sxs/prior treatment) HPI Comments: Patient presents urgent care this afternoon complaining of recurrent and ongoing fashion both of her hands she describes my sweat glands become swollen and is something that has happened many times before you see a dermatologist and he'll serve and he would prescribe me a cream for it which I can't recall the name". I am running out of my blood pressure medicines, was unable to obtain a refill as my clinic clothes recently(referring to [facility])  Patient is a 51 y.o. female presenting with rash. The history is provided by the patient.  Rash  This is a new problem. The problem has not changed since onset.There has been no fever. The rash is present on the right hand and left hand. The patient is experiencing no pain. The pain has been constant since onset. Associated symptoms include itching. Pertinent negatives include no weeping.    Past Medical History  Diagnosis Date  . Hypertension   . Bipolar 1 disorder     History reviewed. No pertinent past surgical history.  No family history on file.  History  Substance Use Topics  . Smoking status: Never Smoker   . Smokeless tobacco: Not on file  . Alcohol Use: No    OB History    Grav Para Term Preterm Abortions TAB SAB Ect Mult Living                  Review of Systems  Constitutional: Negative for fever, chills, activity change and appetite change.  Skin: Positive for itching and rash. Negative for color change and wound.    Allergies  Review of patient's allergies indicates no known allergies.  Home Medications   Current Outpatient Rx  Name Route Sig Dispense Refill  . LORATADINE 10 MG PO TABS Oral Take 1 tablet (10 mg total) by  mouth daily. 30 tablet 0  . TRIAMCINOLONE ACETONIDE 0.1 % EX CREA Topical Apply topically 2 (two) times daily. Apply bid x 2 weeks 30 g 0  . TRIAMTERENE-HCTZ 37.5-25 MG PO TABS Oral Take 1 each (1 tablet total) by mouth daily. 60 tablet 3    BP 148/91  Pulse 69  Temp 98.1 F (36.7 C) (Oral)  Resp 16  SpO2 98%  LMP 02/12/2012  Physical Exam  Nursing note and vitals reviewed. Constitutional: Vital signs are normal. She appears well-developed and well-nourished.  Non-toxic appearance. She does not have a sickly appearance. She does not appear ill.  Pulmonary/Chest: Effort normal and breath sounds normal.  Abdominal: Soft.  Neurological: She is alert.  Skin: Rash noted. Rash is not macular, not nodular, not pustular and not urticarial. She is not diaphoretic. No erythema. No pallor.       ED Course  Procedures (including critical care time)  Labs Reviewed - No data to display No results found.   1. Eczema   2. Medication refill   3. Hypertension       MDM  Medication antihypertensive refills. Patient with hand chemical contract-induced dermatitis. Was prescribed Maxzide along with triamcinolone cream. Patient also requested a loratadine refill. Have advised him given patient referrals and information on how to establish her primary care Dr. for future blood pressure medicine refills and health care maintenance. She understands agrees  and will pursue calling our referrals were         Jimmie Molly, MD 02/22/12 1734

## 2012-02-22 NOTE — ED Notes (Signed)
Patient complains of a rash on both hands and swollen "sweat glands" states that this is a recurring problem, she was seen previously at [facility]

## 2012-05-18 ENCOUNTER — Ambulatory Visit (INDEPENDENT_AMBULATORY_CARE_PROVIDER_SITE_OTHER): Payer: Self-pay | Admitting: Family Medicine

## 2012-05-18 ENCOUNTER — Encounter: Payer: Self-pay | Admitting: Family Medicine

## 2012-05-18 VITALS — BP 124/78 | HR 78 | Temp 98.1°F | Ht 64.0 in | Wt 233.0 lb

## 2012-05-18 DIAGNOSIS — E669 Obesity, unspecified: Secondary | ICD-10-CM

## 2012-05-18 DIAGNOSIS — R053 Chronic cough: Secondary | ICD-10-CM

## 2012-05-18 DIAGNOSIS — F319 Bipolar disorder, unspecified: Secondary | ICD-10-CM

## 2012-05-18 DIAGNOSIS — L732 Hidradenitis suppurativa: Secondary | ICD-10-CM

## 2012-05-18 DIAGNOSIS — L259 Unspecified contact dermatitis, unspecified cause: Secondary | ICD-10-CM

## 2012-05-18 DIAGNOSIS — R05 Cough: Secondary | ICD-10-CM

## 2012-05-18 DIAGNOSIS — R059 Cough, unspecified: Secondary | ICD-10-CM

## 2012-05-18 DIAGNOSIS — I10 Essential (primary) hypertension: Secondary | ICD-10-CM

## 2012-05-18 DIAGNOSIS — L309 Dermatitis, unspecified: Secondary | ICD-10-CM

## 2012-05-18 DIAGNOSIS — R21 Rash and other nonspecific skin eruption: Secondary | ICD-10-CM

## 2012-05-18 MED ORDER — CETIRIZINE HCL 10 MG PO TABS: 10.0000 mg | ORAL_TABLET | Freq: Every day | ORAL | Status: DC

## 2012-05-18 MED ORDER — SULFAMETHOXAZOLE-TRIMETHOPRIM 800-160 MG PO TABS: 1.0000 | ORAL_TABLET | Freq: Two times a day (BID) | ORAL | Status: DC

## 2012-05-18 MED ORDER — TRIAMTERENE-HCTZ 37.5-25 MG PO TABS: 1.0000 | ORAL_TABLET | Freq: Every day | ORAL | Status: DC

## 2012-05-18 MED ORDER — HYDROCORTISONE 0.5 % EX OINT: TOPICAL_OINTMENT | Freq: Two times a day (BID) | CUTANEOUS | Status: DC

## 2012-05-18 NOTE — Progress Notes (Signed)
Subjective:     Patient ID: Stacie Daugherty, female   DOB: [birth year], 51 y.o.   MRN: [id number]  HPI Stacie Daugherty is a 51 y.o. female with history of bipolar disorder, hidradenitis suppurativa, and eczema here for new patient visit.  Patient was previously seen at [facility] which has closed.  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  -- Hidradenitis suppurativa - This comes and goes with some persistent swelling that is always there; sometimes painful.   -- Bipolar disorder - Has been on medication for >5 years: carbamazepine 600mg  daily, trazodone to help sleep.  Carbamazepine increased from 400 to 600mg  daily ~5 years ago since patient's last hospitalization Dec 2008, which leaves patient feeling more depressed but with her bipolar better controlled.  She goes to [place] and sees Dr. Koleen Nimrod (psychiatrist) every 2-3 months. On review of SIGECAPS, patient reports issues with mood being a little depressed lacking "the normal high."  This is her baseline since 2008.  Also reports low interest, energy, concentration.  Denies SI/HI or problems with sleep, guilt, appetite, or psychomotor retardation.   -- Persistent cough - keeps cough drops around; comes and goes.  Visited [facility] 3x for this and dx with allergies.  Taking loratidine.  Doesn't see much difference with this.    PMH, FH, and SH reviewed and in History tab. LMP Dec 9, regular, lasts 4 days every 30 days Last pap smear Jan 2013., normal.  Occasionally abnormal (twice in her life). Mammogram Jan 2013 Normal.  Prior to this, needed 2nd mammogram and biopsy in 2011 which was normal. Never had colonoscopy   Review of Systems Needs reading glasses - hasn't seen eye doctor in a while.  OTC glasses work.  Otherwise, ROS reviewed and negative: No hearing changes, fevers, chills, nausea, vomiting, diarrhea, constipation,  stomach pain, chest pain, difficulty breathing, muscle weakness/pain, confusion, dizziness, fainting, dysuria, vaginal pain or discharge, or rash (other than hand dryness/cracking).     Objective:   Physical Exam BP 124/78  Pulse 78  Temp 98.1 F (36.7 C) (Oral)  Ht 5\' 4"  (1.626 m)  Wt 233 lb (105.688 kg)  BMI 39.99 kg/m2  LMP 05/07/2012 Left lip lower blisters in a clusters - patient has had fever blisters before CV: RRR PULM: No labored breathing; CTAB Extr: Bilateral LE non-edematous ABD: Obese, soft, nontender NEURO: alert and oriented, no focal deficits     Assessment:     Stacie Daugherty is a 51 y.o. female with history of bipolar disorder, hidradenitis suppurativa, and eczema here for new patient visit, also with dry cough and dry, cracked hands.    Plan:     # Health maintenance - Apply for [facility] recertification - given information. - F/u in Feb for physical/health maint or sooner if needed - Colonoscopy - age-appropriate currently.  Patient wants to think about it - discuss at next visit.   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

## 2012-05-18 NOTE — Patient Instructions (Addendum)
It was good to meet you today.  See me again in February or sooner if needed. We can do your annual exam then. I refilled your BP medication (take this daily) and 1 month of your antibiotic. I am writing a prescription for hydrocortisone ointment.  Try this for your hands. I am writing cetirizine for your allergies.  If you take this, do not need to take loratidine. You did not want a flu shot today.

## 2012-05-21 ENCOUNTER — Encounter: Payer: Self-pay | Admitting: Family Medicine

## 2012-05-21 DIAGNOSIS — R053 Chronic cough: Secondary | ICD-10-CM | POA: Insufficient documentation

## 2012-05-21 DIAGNOSIS — R05 Cough: Secondary | ICD-10-CM | POA: Insufficient documentation

## 2012-05-21 NOTE — Assessment & Plan Note (Signed)
Well-controlled. - Continue current management with triamterene-HCTZ 37.5/25mg  daily - refilled

## 2012-05-21 NOTE — Assessment & Plan Note (Signed)
Likely postnasal drip from allergic rhinitis. - Trial cetirizine, discontinue loratidine

## 2012-05-21 NOTE — Assessment & Plan Note (Signed)
-   Refilled TMP/SMZ prescription.

## 2012-05-21 NOTE — Assessment & Plan Note (Signed)
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 - Prescribed hydrocortisone ointment 0.5% - Recommend keeping hands moisturized as much as possible with cream/vaseline

## 2012-05-21 NOTE — Assessment & Plan Note (Signed)
-   Discussed benefits of weight loss. - Re-discuss at following visit.

## 2012-05-21 NOTE — Assessment & Plan Note (Addendum)
Patient with mild depressive/dysthymic symptoms today but no SI/HI and feels bipolar symptoms controlled.  Continue current medical management and regular visits at [facility] with psychiatrist.

## 2012-07-31 ENCOUNTER — Ambulatory Visit (INDEPENDENT_AMBULATORY_CARE_PROVIDER_SITE_OTHER): Payer: No Typology Code available for payment source | Admitting: Family Medicine

## 2012-07-31 ENCOUNTER — Encounter: Payer: Self-pay | Admitting: Family Medicine

## 2012-07-31 ENCOUNTER — Other Ambulatory Visit: Payer: Self-pay | Admitting: Family Medicine

## 2012-07-31 VITALS — BP 121/82 | HR 86 | Temp 98.2°F | Ht 64.0 in | Wt 223.0 lb

## 2012-07-31 DIAGNOSIS — F3162 Bipolar disorder, current episode mixed, moderate: Secondary | ICD-10-CM | POA: Insufficient documentation

## 2012-07-31 DIAGNOSIS — R42 Dizziness and giddiness: Secondary | ICD-10-CM

## 2012-07-31 DIAGNOSIS — R350 Frequency of micturition: Secondary | ICD-10-CM

## 2012-07-31 DIAGNOSIS — F319 Bipolar disorder, unspecified: Secondary | ICD-10-CM

## 2012-07-31 DIAGNOSIS — R5381 Other malaise: Secondary | ICD-10-CM

## 2012-07-31 DIAGNOSIS — I10 Essential (primary) hypertension: Secondary | ICD-10-CM

## 2012-07-31 LAB — CBC WITH DIFFERENTIAL/PLATELET
Basophils Absolute: 0 10*3/uL (ref 0.0–0.1)
Basophils Relative: 0 % (ref 0–1)
Eosinophils Absolute: 0.1 10*3/uL (ref 0.0–0.7)
Eosinophils Relative: 1 % (ref 0–5)
HCT: 36.3 % (ref 36.0–46.0)
Hemoglobin: 12.8 g/dL (ref 12.0–15.0)
Lymphocytes Relative: 26 % (ref 12–46)
Lymphs Abs: 2.4 10*3/uL (ref 0.7–4.0)
MCH: 30.5 pg (ref 26.0–34.0)
MCHC: 35.3 g/dL (ref 30.0–36.0)
MCV: 86.6 fL (ref 78.0–100.0)
Monocytes Absolute: 1.2 10*3/uL — ABNORMAL HIGH (ref 0.1–1.0)
Monocytes Relative: 13 % — ABNORMAL HIGH (ref 3–12)
Neutro Abs: 5.3 10*3/uL (ref 1.7–7.7)
Neutrophils Relative %: 60 % (ref 43–77)
Platelets: 303 10*3/uL (ref 150–400)
RBC: 4.19 MIL/uL (ref 3.87–5.11)
RDW: 13.5 % (ref 11.5–15.5)
WBC: 9.1 10*3/uL (ref 4.0–10.5)

## 2012-07-31 LAB — POCT URINALYSIS DIPSTICK
Bilirubin, UA: NEGATIVE
Glucose, UA: NEGATIVE
Ketones, UA: NEGATIVE
Nitrite, UA: NEGATIVE
Spec Grav, UA: 1.015
Urobilinogen, UA: 0.2
pH, UA: 5.5

## 2012-07-31 LAB — POCT UA - MICROSCOPIC ONLY

## 2012-07-31 LAB — POCT GLYCOSYLATED HEMOGLOBIN (HGB A1C): Hemoglobin A1C: 5.6

## 2012-07-31 MED ORDER — CIPROFLOXACIN HCL 500 MG PO TABS: 500.0000 mg | ORAL_TABLET | Freq: Every day | ORAL | Status: AC

## 2012-07-31 NOTE — Assessment & Plan Note (Signed)
Patient does not seem to be doing well on her medication. She is however not suicidal. I discussed medication adjustment with her which she will like to discuss with her Psychiatrist. She will schedule f/u with her psychiatrist,in the interim she is to call us if there is any concern.

## 2012-07-31 NOTE — Patient Instructions (Addendum)

## 2012-07-31 NOTE — Assessment & Plan Note (Addendum)
R/O UTI,R/O DM Symptom improving. Urine dipstick checked today.positive for bacteria,leukocyte,urine sent for culture and i started her on Cipro for 3 days. BMP and A1C for DM screening checked today,I will f/u with result.

## 2012-07-31 NOTE — Assessment & Plan Note (Signed)
R/O anemia CBC checked. Rest at home recommended. Return soon if symptom persist,otherwise to f/u in 2 wks with her PMD.

## 2012-07-31 NOTE — Assessment & Plan Note (Addendum)
Most likely related to her antipsychotics meds. R/O Anemia vs stress induced or depression related. Graded exercise encouraged. CBC checked today to r/o anemia. TSH ordered as well. Advised to discuss her bipolar medication with her psychiatrist. Return in 2 wks as scheduled for follow up or sooner if symptom worsens.

## 2012-07-31 NOTE — Assessment & Plan Note (Signed)
BP is optimal. Continue current BP meds. BMP checked today. Return fasting for FLP.

## 2012-07-31 NOTE — Progress Notes (Signed)
Subjective:     Patient ID: Stacie Daugherty, female   DOB: Dec 10, 1960, 52 y.o.   MRN: [id number]  HPI Fatigue and dizziness:C/O tiredness since last week.Stayed in bed the whole weekend,she is on medications which makes her tied,there is associated dizziness especially whenever she bends down,whenever she coughs at times she has headache.No N/V,she has low appetite for the last few days,no stomach pain,she did had diarrhea 2 days ago,which has resolved.She denies feeling depression,she denies sleep problem,but now she is in a hotel due to renovation hence not sleeping well at the hotel.She is currently under stress with her husband and being in a hotel,she denies any work related stress. Urine frequency: Since the last 2 days she has had episode of increase urine frequency,she will go to the bathroom every 15-20 min,this has improved since yesterday,there was associated hx of dysuria,denies blood in her urine but urine looked darker.She seem to be having same symptom her husband had when he got diagnosed with DM.She does not have any Fhx of DM.No flank pain. HTN; She is compliant with her BP medication,last dose was taken this morning,her BP has been running normal. Bipolar: She was recently seen by her psychiatrist at [facility] stated she does not feel her medication is helping much,she had been on this medications for the last 5 yrs,during her last visit there was no adjustment made to her medication.She is currently on Zyprexa,Trazadone,both med makes her feel tired whenever she takes them.  Past Medical History  Diagnosis Date   Hypertension    Bipolar 1 disorder     Hospitalized multiple times for bipolar disorder ([facility] - [facility], [facility], and [facility], most recently in [place])   Hidradenitis suppurativa      Review of Systems  Constitutional: Positive for fatigue. Negative for fever.  Respiratory: Negative.  Negative for chest tightness, shortness of  breath and wheezing.   Cardiovascular: Negative for chest pain, palpitations and leg swelling.  Gastrointestinal: Negative for nausea and vomiting.       Poor appetite  Genitourinary: Positive for dysuria and frequency. Negative for urgency, hematuria, flank pain and pelvic pain.  Neurological: Positive for dizziness and headaches.  All other systems reviewed and are negative.   Filed Vitals:   07/31/12 1515  BP: 121/82  Pulse: 86  Temp: 98.2 F (36.8 C)  TempSrc: Oral  Height: 5\' 4"  (1.626 m)  Weight: 223 lb (101.152 kg)      Objective:   Physical Exam  Nursing note and vitals reviewed. Constitutional: She is oriented to person, place, and time. She appears well-developed. No distress.  Eyes:  No pallor  Neck: No thyromegaly present.  Cardiovascular: Normal rate, regular rhythm, normal heart sounds and intact distal pulses.   No murmur heard. Pulmonary/Chest: Effort normal and breath sounds normal. No respiratory distress. She has no wheezes.  Abdominal: Soft. Bowel sounds are normal. She exhibits no distension and no mass. There is no tenderness. There is no guarding.  Musculoskeletal: Normal range of motion. She exhibits no edema.  Neurological: She is alert and oriented to person, place, and time. No cranial nerve deficit.  Psychiatric: Her behavior is normal. Her mood appears not anxious. She expresses no suicidal ideation. She expresses no suicidal plans.

## 2012-08-01 LAB — BASIC METABOLIC PANEL
BUN: 27 mg/dL — ABNORMAL HIGH (ref 6–23)
CO2: 32 mEq/L (ref 19–32)
Calcium: 9.9 mg/dL (ref 8.4–10.5)
Chloride: 90 mEq/L — ABNORMAL LOW (ref 96–112)
Creat: 1.43 mg/dL — ABNORMAL HIGH (ref 0.50–1.10)
Glucose, Bld: 104 mg/dL — ABNORMAL HIGH (ref 70–99)
Potassium: 4.2 mEq/L (ref 3.5–5.3)
Sodium: 130 mEq/L — ABNORMAL LOW (ref 135–145)

## 2012-08-01 LAB — TSH: TSH: 0.593 u[IU]/mL (ref 0.350–4.500)

## 2012-08-02 ENCOUNTER — Telehealth: Payer: Self-pay | Admitting: Family Medicine

## 2012-08-02 LAB — URINE CULTURE: Colony Count: >=100000

## 2012-08-02 NOTE — Telephone Encounter (Signed)
Patient was seen by Dr. Lum Babe and was called in a Cipro but does not know why she is having to take it. Would like clarification on why exactly she is having to tale Cipro. Pls call patient today or tomorrow.

## 2012-08-02 NOTE — Telephone Encounter (Signed)
Spoke with Dr. Forrestine Him and patient advised about UTI. Advised will  repeat UA at next visit.

## 2012-08-05 ENCOUNTER — Encounter: Payer: Self-pay | Admitting: Family Medicine

## 2012-08-06 ENCOUNTER — Telehealth: Payer: Self-pay | Admitting: *Deleted

## 2012-08-06 NOTE — Telephone Encounter (Signed)
Pt informed and agreeable. Stacie Daugherty  

## 2012-08-06 NOTE — Telephone Encounter (Signed)
Patient called but no answer.  LVM on cell for patient to call back.  Deanne Coffer

## 2012-08-06 NOTE — Telephone Encounter (Signed)
Message copied by Osborne Oman on Mon Aug 06, 2012 12:01 PM ------      Message from: Janit Pagan T      Created: Thu Aug 02, 2012  1:19 PM       Patient already on Cipro for UTI,please call her to let her know her urine test is positive for e.coli,she need repeat UA at next visit. ------

## 2012-08-06 NOTE — Telephone Encounter (Signed)
Pt will also need to be informed that (per Dr. Lum Babe) her thyroid and diabetes test was normal.  Her kidney function was a little off, but per Dr. Lum Babe "salt to your diet to improve your sodium level,hydrate yourself and avoid NSAID (ibuprofen,motrin,advil) to improve your kidney function".  We will recheck this at her next visit. [place], Stacie Daugherty

## 2012-08-16 ENCOUNTER — Other Ambulatory Visit (HOSPITAL_COMMUNITY)
Admission: RE | Admit: 2012-08-16 | Discharge: 2012-08-16 | Disposition: A | Discharge status: Home / Self Care | Payer: No Typology Code available for payment source | Source: Ambulatory Visit | Attending: Family Medicine | Admitting: Family Medicine

## 2012-08-16 ENCOUNTER — Ambulatory Visit (INDEPENDENT_AMBULATORY_CARE_PROVIDER_SITE_OTHER): Payer: No Typology Code available for payment source | Admitting: Family Medicine

## 2012-08-16 VITALS — BP 146/85 | HR 81 | Ht 63.5 in | Wt 227.0 lb

## 2012-08-16 DIAGNOSIS — I10 Essential (primary) hypertension: Secondary | ICD-10-CM

## 2012-08-16 DIAGNOSIS — Z9189 Other specified personal risk factors, not elsewhere classified: Secondary | ICD-10-CM

## 2012-08-16 DIAGNOSIS — Z124 Encounter for screening for malignant neoplasm of cervix: Secondary | ICD-10-CM

## 2012-08-16 DIAGNOSIS — Z2089 Contact with and (suspected) exposure to other communicable diseases: Secondary | ICD-10-CM

## 2012-08-16 DIAGNOSIS — Z202 Contact with and (suspected) exposure to infections with a predominantly sexual mode of transmission: Secondary | ICD-10-CM

## 2012-08-16 DIAGNOSIS — L732 Hidradenitis suppurativa: Secondary | ICD-10-CM

## 2012-08-16 DIAGNOSIS — E669 Obesity, unspecified: Secondary | ICD-10-CM

## 2012-08-16 DIAGNOSIS — Z1211 Encounter for screening for malignant neoplasm of colon: Secondary | ICD-10-CM

## 2012-08-16 DIAGNOSIS — E7800 Pure hypercholesterolemia, unspecified: Secondary | ICD-10-CM

## 2012-08-16 DIAGNOSIS — Z113 Encounter for screening for infections with a predominantly sexual mode of transmission: Secondary | ICD-10-CM | POA: Insufficient documentation

## 2012-08-16 DIAGNOSIS — F319 Bipolar disorder, unspecified: Secondary | ICD-10-CM

## 2012-08-16 DIAGNOSIS — N390 Urinary tract infection, site not specified: Secondary | ICD-10-CM

## 2012-08-16 DIAGNOSIS — Z1151 Encounter for screening for human papillomavirus (HPV): Secondary | ICD-10-CM | POA: Insufficient documentation

## 2012-08-16 DIAGNOSIS — Z01419 Encounter for gynecological examination (general) (routine) without abnormal findings: Secondary | ICD-10-CM | POA: Insufficient documentation

## 2012-08-16 LAB — POCT WET PREP (WET MOUNT): Clue Cells Wet Prep Whiff POC: NEGATIVE

## 2012-08-16 LAB — POCT URINALYSIS DIPSTICK
Bilirubin, UA: NEGATIVE
Blood, UA: NEGATIVE
Glucose, UA: NEGATIVE
Ketones, UA: NEGATIVE
Leukocytes, UA: NEGATIVE
Nitrite, UA: NEGATIVE
Protein, UA: NEGATIVE
Spec Grav, UA: 1.02
Urobilinogen, UA: 0.2
pH, UA: 7

## 2012-08-16 LAB — POCT URINE PREGNANCY: Preg Test, Ur: NEGATIVE

## 2012-08-16 MED ORDER — TRIAMTERENE-HCTZ 37.5-25 MG PO TABS: 1.0000 | ORAL_TABLET | Freq: Every day | ORAL | Status: DC

## 2012-08-16 MED ORDER — MINOCYCLINE HCL 75 MG PO CAPS: 75.0000 mg | ORAL_CAPSULE | Freq: Two times a day (BID) | ORAL | Status: DC

## 2012-08-16 NOTE — Patient Instructions (Addendum)
It was good to see you today Stacie Daugherty.  We did a pap smear and some other tests today. I will call you if any results are abnormal.  I want you to come back for some bloodwork when are fasting, any time our office is open. For your hydradenitis suppuritiva, I am prescribing minocycline since you took this before. Take this daily, stop taking the other antibiotic and let me know if you have any side effects. For exercise planning, your goal today was to walk with your son and your new puppy 2-3 times per week for 20-30 minutes each time. Please write down when you exercise, what you did, and for how long and bring this next time. Please call and schedule a screening mammogram. Please return a stool card when you come for your labs. Please come back in a few weeks so we can talk about weight loss.  Bring all your medications to every visit.

## 2012-08-17 MED ORDER — TRIAMTERENE-HCTZ 37.5-25 MG PO TABS: 1.0000 | ORAL_TABLET | Freq: Every day | ORAL | Status: DC

## 2012-08-18 DIAGNOSIS — R768 Other specified abnormal immunological findings in serum: Secondary | ICD-10-CM | POA: Insufficient documentation

## 2012-08-18 NOTE — Assessment & Plan Note (Signed)
Pt sees [facility] for medication mngmt; no SI/HI today - Continue current treatment with tegretol, zyprexa, and trazodone

## 2012-08-18 NOTE — Progress Notes (Signed)
Subjective:     Patient ID: Stacie Daugherty, female   DOB: 1960/12/06, 52 y.o.   MRN: [id number]  CC - CPE  HPI  Stacie Daugherty is a 52 y.o. female with h/o obesity, bipolar disorder, and hidradenitis suppurativa here for a CPE and wants to discuss her hydradenitis suppurativa medication.  1. HTN - Pt takes maxzide daily and needs a refill. Denies side effects or complications. BP today is 146/85.  2. Hidradenitis suppurativa - Pt reports increased discomfort and 'flare up' at left underarm. She wants to switch back to minocycline (instead of Bactrim) as it had been rx'ed by former dermatologist and worked well. Has not been taking her Bactrim consistently. Denies fever/chills. Reports left breast tenderness but denies swelling or discharge.    3. Recent UTI - Took 3 day course of abx, feels better and denies dysuria, fever, chills.  4. Health Maintenance - Pt would like pap smear today, as well as STD testing as she is unsure if her husband is faithful, stating that "he says he has slept with other women when he gets mad at me." Has not had period this month and thinks it could be perimenopause but wants a UPT. Uses "encare" suppository for birth control, denies spotting or abdominal pain. Denies vaginal itching/burning/dyspareunia. 5. Bipolar disorder - Has medication management at [facility], sees them every 2.5 months, has appointment in April, and thinks they help; denies SI/HI today.   Review of Systems - Per HPI   PMH, SH, and FH reviewed with relevant changes below: - Discontinued bactrim per pt request; d/c'ed claritin as pt not taking; not taking triamcinolone cream and could not afford cetirizine - SH: Sexual hx per above; pt denies new sexual partners - Denies tobacco, drugs, alcohol Past Medical History  Diagnosis Date  . Hypertension   . Bipolar 1 disorder     Hospitalized multiple times for bipolar disorder ([facility] - [facility], [facility], and [facility], most recently in [place])  . Hidradenitis suppurativa        Objective:   Physical Exam BP 146/85  Pulse 81  Ht 5' 3.5" (1.613 m)  Wt 227 lb (102.967 kg)  BMI 39.58 kg/m2  LMP 07/07/2012  GEN: NAD, pleasant SKIN: Left underarm with firm tender inflamed nonerythematous skin, no blood or exudate BREAST: On breast exam, no masses palpated left breast GU: normal external genitalia, cervix visualized, no cervical motion tenderness, no masses, no abnormal discharge NEURO: Alert, no focal deficits, EOMI, normal speech and gait PSYCH: Mood "fine", affect euthymic, normal thought process, normal speech, no psychomotor retardation    Assessment:     Stacie Daugherty is a 52 y.o. female with h/o obesity, bipolar disorder, and hidradenitis suppurativa here for a CPE and wants to discuss her hydradenitis suppurativa medication.    Plan:     # Health Maintenance - Pap smear, STD testing today - Discussed weight loss with pt to keep logbook of exercise this week (goal 2-3x/wk 20-30 minutes each) - Bring stool card; repeat annually (cannot afford colonoscopy) - Gave screening mammogram information - Future fasting lipid panel, HIV, RPR  # Recent UTI - F/u UA      I spent 40 minutes with this patient and >50% in the room counseling.

## 2012-08-18 NOTE — Assessment & Plan Note (Signed)
Return fasting to check lipid panel.

## 2012-08-18 NOTE — Assessment & Plan Note (Signed)
Pt wishes to have STD testing today. - Ordered urine pregnancy test, GC/Chlamydia, wet prep testing today - Ordered HIV/RPR as future labs (blood draw when comes for lipids)

## 2012-08-18 NOTE — Assessment & Plan Note (Addendum)
Flare-up currently without systemic symptoms; Currently on bactrim, taking infrequently. Would like to switch to minocycline.  - Minocycline 75mg  BID for 2 months - Follow-up in 2 months to re-evaluate need for further antibiotics

## 2012-08-18 NOTE — Assessment & Plan Note (Signed)
BP today is moderately controlled. - Refilled maxzide - Discussed weight loss. Pt plans 2-3 times per week 20-30 minutes and to keep logbook - Follow-up 2-3 weeks, recheck creatinine (elevated last visit compared to 2011)

## 2012-08-21 ENCOUNTER — Encounter: Payer: Self-pay | Admitting: Family Medicine

## 2012-08-21 ENCOUNTER — Telehealth: Payer: Self-pay | Admitting: Family Medicine

## 2012-08-21 NOTE — Telephone Encounter (Signed)
Front desk/Blue team: Attempted to call but did not get patient. Sent letter. If she calls, please let her know that urinalysis, urine pregnancy test, wet prep, and gonorrhea/chlamydia testing were all negative and pap smear is still pending. She still has future orders that she needs to come in fasting for.  Thank you.

## 2012-08-22 ENCOUNTER — Telehealth: Payer: Self-pay | Admitting: Family Medicine

## 2012-08-22 ENCOUNTER — Other Ambulatory Visit: Payer: Self-pay | Admitting: Family Medicine

## 2012-08-22 ENCOUNTER — Encounter: Payer: Self-pay | Admitting: Family Medicine

## 2012-08-22 DIAGNOSIS — Z1231 Encounter for screening mammogram for malignant neoplasm of breast: Secondary | ICD-10-CM

## 2012-08-22 NOTE — Telephone Encounter (Signed)
Tried to call to give pt normal pap smear result. Will send letter.

## 2012-08-22 NOTE — Telephone Encounter (Signed)
Pap smear negative. Could not reach pt again. Did not want to leave this information on voicemail. Sent letter.

## 2012-08-29 ENCOUNTER — Ambulatory Visit (HOSPITAL_COMMUNITY): Payer: No Typology Code available for payment source

## 2012-09-05 ENCOUNTER — Ambulatory Visit (HOSPITAL_COMMUNITY)
Admission: RE | Admit: 2012-09-05 | Discharge: 2012-09-05 | Disposition: A | Discharge status: Home / Self Care | Payer: No Typology Code available for payment source | Source: Ambulatory Visit | Attending: Family Medicine | Admitting: Family Medicine

## 2012-09-05 DIAGNOSIS — Z1231 Encounter for screening mammogram for malignant neoplasm of breast: Secondary | ICD-10-CM | POA: Insufficient documentation

## 2012-09-11 ENCOUNTER — Other Ambulatory Visit: Payer: No Typology Code available for payment source

## 2012-09-11 DIAGNOSIS — E669 Obesity, unspecified: Secondary | ICD-10-CM

## 2012-09-11 DIAGNOSIS — Z202 Contact with and (suspected) exposure to infections with a predominantly sexual mode of transmission: Secondary | ICD-10-CM

## 2012-09-11 LAB — LIPID PANEL
Cholesterol: 215 mg/dL — ABNORMAL HIGH (ref 0–200)
HDL: 67 mg/dL (ref >39)
LDL Cholesterol: 127 mg/dL — ABNORMAL HIGH (ref 0–99)
Total CHOL/HDL Ratio: 3.2 Ratio
Triglycerides: 104 mg/dL (ref <150)
VLDL: 21 mg/dL (ref 0–40)

## 2012-09-11 NOTE — Progress Notes (Signed)
FLP,HIV AND RPR DONE TODAY Stacie Daugherty

## 2012-09-12 LAB — HIV ANTIBODY (ROUTINE TESTING W REFLEX): HIV: NONREACTIVE

## 2012-09-12 LAB — RPR: RPR Ser Ql: REACTIVE — AB

## 2012-09-12 LAB — RPR TITER: RPR Titer: 1:2 {titer}

## 2012-09-12 LAB — T.PALLIDUM AB, TOTAL: T pallidum Antibodies (TP-PA): 0.11 S/CO (ref <0.90)

## 2012-09-17 ENCOUNTER — Encounter: Payer: Self-pay | Admitting: Family Medicine

## 2012-10-12 ENCOUNTER — Telehealth: Payer: Self-pay | Admitting: Family Medicine

## 2012-10-12 DIAGNOSIS — E7800 Pure hypercholesterolemia, unspecified: Secondary | ICD-10-CM

## 2012-10-12 DIAGNOSIS — Z202 Contact with and (suspected) exposure to infections with a predominantly sexual mode of transmission: Secondary | ICD-10-CM

## 2012-10-12 NOTE — Telephone Encounter (Signed)
Called and left message on voice mail for patient to call back. When patient calls, please let her know that HIV was negative and cholesterol was a little elevated but not to the point where we would start treatment (10 year ASCVD risk 4.7). I want to recheck cholesterol in 6 months - 1 year especially because she is on zyprexa. Syphilis test had a false positive (RPR reactive, titer 1:2, TP-PA 0.11 (nonreactive)), which means she most likely does not have syphilis and something else was reacting with the test. We can repeat testing in 6 months.  If she has any questions, please let me know.  Simone Curia 10/12/2012 12:06 PM

## 2012-10-18 ENCOUNTER — Ambulatory Visit (INDEPENDENT_AMBULATORY_CARE_PROVIDER_SITE_OTHER): Payer: No Typology Code available for payment source | Admitting: Family Medicine

## 2012-10-18 VITALS — BP 119/72 | HR 80 | Temp 98.6°F | Ht 63.5 in | Wt 222.1 lb

## 2012-10-18 DIAGNOSIS — M2550 Pain in unspecified joint: Secondary | ICD-10-CM

## 2012-10-18 NOTE — Patient Instructions (Addendum)
Nice to meet you today.  Please try Ibuprofen for your pain. You can take 800 mg every 6 hours as needed. If you develop increasing neck stiffness, fever, worsening pain, change in mental status please seek medical attention. If this is not better by next week please return to be seen in clinic.

## 2012-10-18 NOTE — Assessment & Plan Note (Addendum)
Patient with acute onset of pain in multiple joints. With complaint of neck stiffness, must consider meningitis, though patient without fever or other signs of infection, had full ROM of neck and negative brudzinski's sign. With multiple joint involvement you must consider rheumatic disease (is symmetric, though not worse in the am), viral illness (though no signs of infection), fibromyalgia, psoriatic arthritis (no history of rash), lyme (no history of rash or tick exposure), polymyalgia rheumatica (though patient with small distal joint involvement). Plan: advised ibuprofen 800 mg q6 hr. If does not improve by middle of next week asked that patient be seen. Given signs to seek medical attention (increasing neck stiffness, fever, worsening pain, change in mental status). Patient voiced understanding of this.

## 2012-10-18 NOTE — Progress Notes (Signed)
  Subjective:    Patient ID: Stacie Daugherty, female    DOB: Sep 27, 1960, 52 y.o.   MRN: [id number]  HPI Patient is a 52 yo female who presents for bodyaches and swelling in feet and legs since yesterday.  States started yesterday morning with stiff and swollen hands and sore knees. Also noted neck was stiff. States took [employer] and this helped a little bit. States fingers are swollen and hurt when she balls her hands up. Pain has been stable throughout the day. Has never had anything like this before. Denies fever, chills, sick contacts, tick exposure, rash, nausea, vomiting, diarrhea, congestion, runny nose, ear pain, changes in medications or new medications.  Review of Systems see HPI     Objective:   Physical Exam  Constitutional: She appears well-developed and well-nourished.  HENT:  Head: Normocephalic and atraumatic.  Eyes: Pupils are equal, round, and reactive to light.  Neck: Normal range of motion. Neck supple.  ROM normal, patient with tightness in trapezius distrubution on rotation and flexion of neck, negative brudzinski's  Cardiovascular: Normal rate, regular rhythm and normal heart sounds.   Pulmonary/Chest: Effort normal and breath sounds normal.  Musculoskeletal: Normal range of motion.  Swelling in bilateral fingers not specifically in the joints with no tenderness to palpation Knee joint line without tenderness and no swelling Bilateral foot swelling with no tenderness Normal gait  Lymphadenopathy:    She has no cervical adenopathy.  Skin: Skin is warm and dry. No rash noted.   Filed Vitals:   10/18/12 1458  BP: 119/72  Pulse: 80  Temp: 98.6 F (37 C)      Assessment & Plan:

## 2012-10-26 ENCOUNTER — Ambulatory Visit (INDEPENDENT_AMBULATORY_CARE_PROVIDER_SITE_OTHER): Payer: No Typology Code available for payment source | Admitting: Family Medicine

## 2012-10-26 VITALS — BP 128/82 | HR 78 | Temp 98.1°F | Ht 64.0 in | Wt 224.0 lb

## 2012-10-26 DIAGNOSIS — L732 Hidradenitis suppurativa: Secondary | ICD-10-CM

## 2012-10-26 DIAGNOSIS — Z111 Encounter for screening for respiratory tuberculosis: Secondary | ICD-10-CM

## 2012-10-26 DIAGNOSIS — M2550 Pain in unspecified joint: Secondary | ICD-10-CM

## 2012-10-26 MED ORDER — SULFAMETHOXAZOLE-TRIMETHOPRIM 800-160 MG PO TABS: 1.0000 | ORAL_TABLET | Freq: Two times a day (BID) | ORAL | Status: DC

## 2012-10-26 NOTE — Progress Notes (Signed)
Patient ID: Stacie Daugherty, female   DOB: 1960-11-22, 52 y.o.   MRN: [id number] Subjective:    CC: Follow up joint stiffness  HPI: Stacie Daugherty is a 52 y.o. female with h/o HTN, kobesity, bipolar disorder, and recent visit for joint pain here for follow-up of joint pain.   She reports improved symptoms since last visit, when she had had 3 days of severe body aches that did nto improve with alieve. She was told to continue NSAID and return if symptoms did not improve or if she developed neck stiffness. She took husband's prednisone dose-pack since then for 5 days, starting at 25mg  and tapering down to 5mg  which she took the last dose of yesterday. She reports improved symptoms, with continued leg and shoulder stiffness and some pain but resolved swelling in hands. Denies fevers or other new symptoms.   Review of Systems - Per HPI; all other systems reviewed and negative.  Past Medical History  Diagnosis Date  . Hypertension   . Bipolar 1 disorder     Hospitalized multiple times for bipolar disorder (DC - [facility], [facility], and [facility], most recently in [place])  . Hidradenitis suppurativa    SH: No changes.  History  Substance Use Topics  . Smoking status: Former Smoker    Types: Cigarettes  . Smokeless tobacco: Not on file     Comment: Smoked few cigarettes, socially, "did not inhale"  . Alcohol Use: No     Comment: Social alcohol use when younger   FH: No FH of rheumatoid arthtritis.     Objective:  Physical Exam BP 128/82  Pulse 78  Temp(Src) 98.1 F (36.7 C) (Oral)  Ht 5\' 4"  (1.626 m)  Wt 224 lb (101.606 kg)  BMI 38.43 kg/m2 GEN: NAD, obese MSK: No deformity or swelling upper or lower extremities, no joint tenderness or effusion, no spine or paraspinal tenderness, no neck stiffness, 5/5 upper and lower extremity strength Neuro: Normal gait now, normal speech, no focal deficit  Assessment:     Stacie Daugherty is a 52 y.o. female  with h/o HTN, kobesity, bipolar disorder, and recent visit for joint pain here for follow-up of joint pain.     Plan:     # Health maintenance:  - TB skin test today for work. - Return Monday for nurse visit to read test.  # See problem list for problem-specific plans.

## 2012-10-26 NOTE — Assessment & Plan Note (Signed)
Patient realized minocycline was too expensive so requested re-writing rx for TMP-SMZ. Reordered previous dose TMP-SMZ x 2 months and asked pt to f/u in 2 months to re-evaluate.

## 2012-10-26 NOTE — Telephone Encounter (Signed)
She voices understanding.

## 2012-10-26 NOTE — Patient Instructions (Addendum)
It was great to see you again!  For your your joint pain, I am very glad this is feeling better.  - In the future, do not take anyone else's prescription medication as this can have side effects for you that were not anticipated. - Return in 1 month and we can do a rheumatoid arthritis workup. We will not do it now because you took prednisone which will make the test inaccurate. - Try massage and hot/cold pad on your neck. If it gets very stiff, please seek immediate care. - Getting a new mattress may help.  As you leave, make an appointment to follow up with me in 1 month. - Bring all medications in a bag to your visits. - Bring a log of your exercise when you come back.  Come back on Monday for a nurse appointment to read the TB test that we placed today for work, before 4pm.  Take care and seek immediate care sooner than 1 month if you develop any concerns.

## 2012-10-26 NOTE — Telephone Encounter (Signed)
Pt in office today and states someone told her about these results.

## 2012-10-26 NOTE — Assessment & Plan Note (Addendum)
Joint pain is improving after used husband's prednisone. DDx includes rheumatoid arthritis, viral illness, musculoskeletal injury, infective process. With resolution, viral illness or MSK most likely. Cannot rule out rheumatoid process though would have expected this to be more severe. - Instructed to refrain from using other people's medication. - Rtrn in 1 month for ANA, rheumatoid factor, ESR, and repeat TB skin test to further workup joint pain; will not do now as pt has taken prednisone recently. - Try massage and hot/cold pad on your neck. If it gets very stiff, please seek immediate care. - Getting a new mattress may help. - Exercise and bring log of exercise on return. - Return precautions per AVS.

## 2012-10-29 ENCOUNTER — Ambulatory Visit (INDEPENDENT_AMBULATORY_CARE_PROVIDER_SITE_OTHER): Payer: No Typology Code available for payment source | Admitting: *Deleted

## 2012-10-29 ENCOUNTER — Encounter: Payer: Self-pay | Admitting: *Deleted

## 2012-10-29 DIAGNOSIS — Z111 Encounter for screening for respiratory tuberculosis: Secondary | ICD-10-CM

## 2012-10-29 LAB — TB SKIN TEST
Induration: 0 mm
TB Skin Test: NEGATIVE

## 2012-10-29 NOTE — Progress Notes (Signed)
PPD Reading Note PPD read and results entered in EpicCare. Result: 0 mm induration. Interpretation: negative Elizabeth Trystan Akhtar, RN-BSN  

## 2012-11-22 ENCOUNTER — Encounter: Payer: Self-pay | Admitting: Family Medicine

## 2012-11-22 ENCOUNTER — Ambulatory Visit (INDEPENDENT_AMBULATORY_CARE_PROVIDER_SITE_OTHER): Payer: No Typology Code available for payment source | Admitting: Family Medicine

## 2012-11-22 VITALS — BP 105/73 | HR 76 | Temp 98.3°F | Ht 64.0 in | Wt 218.0 lb

## 2012-11-22 DIAGNOSIS — R21 Rash and other nonspecific skin eruption: Secondary | ICD-10-CM

## 2012-11-22 MED ORDER — DOXYCYCLINE HYCLATE 100 MG PO TABS: 100.0000 mg | ORAL_TABLET | Freq: Two times a day (BID) | ORAL | Status: DC

## 2012-11-22 NOTE — Patient Instructions (Addendum)
[name],  Thank you for coming in today. The rash on your hand can be either worsening dermatitis or superficial infection called cellulitis. I would like to treat for possible bacterial infection with an antibiotic. Please take doxycycline 100 mg twice daily for 10 days. Is fine to continue the triple antibiotic ointment.  Depending on how you respond to antibiotic he may also require a stronger steroid ointment or cream in the near future.  Please continue to cover the area when you wearing gloves.  Followup with her primary doctor 2 weeks.  Followup sooner if that area spreads, he developed pain or fever.  Dr. Armen Pickup

## 2012-11-22 NOTE — Progress Notes (Signed)
Subjective:     Patient ID: Stacie Daugherty, female   DOB: 1960/09/16, 52 y.o.   MRN: [id number]  HPI 52 year old female history of bipolar disorder, hidradenitis and hand dermatitis presents for evaluation of right hand rash. Patient was at the rest of the present for the past 2 months. She feels like it was initiated by a nick from puppy during play. She's been applying alcohol to the area without improvement. She feels that the rash is worsening. There is some associated pain. There's been no associated fever, chills, night sweats. She does have history of hand dermatitis for which she uses hydrocortisone cream when necessary. She does have a history of hidradenitis for which she takes Bactrim ~3 times weekly for suppressive therapy. She is nonsmoker. She is not diabetic.  Review of Systems As per HPI     Objective:   Physical Exam BP 105/73  Pulse 76  Temp(Src) 98.3 F (36.8 C) (Oral)  Ht 5\' 4"  (1.626 m)  Wt 218 lb (98.884 kg)  BMI 37.4 kg/m2 General appearance: alert, cooperative and no distress Skin: There is a 2 x 2 centimeter scaly/flaky raised circular rash on the ulnar side of the right hand. His underlying erythema and surrounding hyperpigmentation.there is slightly tender to palpation.  Multiple areas of induration at her right axilla consistent with recurrent hidradenitis. No tender or fluctuant areas. No supraclavicular lymphadenopathy.  Assessment and Plan:

## 2012-11-22 NOTE — Assessment & Plan Note (Signed)
A: The rash on your hand can be either worsening dermatitis (discoid eczema)  or superficial infection called cellulitis.  P:  I would like to treat for possible bacterial infection with an antibiotic. Please take doxycycline 100 mg twice daily for 10 days. Is it fine to continue the triple antibiotic ointment. F/u in two weeks. If there's no improvement with antibiotic recommend treating for discoid eczema with a stronger topical corticosteroid cream.

## 2012-11-26 ENCOUNTER — Telehealth: Payer: Self-pay | Admitting: Family Medicine

## 2012-11-26 NOTE — Telephone Encounter (Signed)
Pt [facility] and she cannot afford it. Pharmacy wants to know if generic is ok [facility] [address] Please advise

## 2012-11-26 NOTE — Telephone Encounter (Signed)
Will forward to Dr Briant Sites

## 2012-11-27 ENCOUNTER — Telehealth: Payer: Self-pay | Admitting: *Deleted

## 2012-11-27 DIAGNOSIS — R21 Rash and other nonspecific skin eruption: Secondary | ICD-10-CM

## 2012-11-27 MED ORDER — DOXYCYCLINE HYCLATE 100 MG PO TABS: 100.0000 mg | ORAL_TABLET | Freq: Two times a day (BID) | ORAL | Status: DC

## 2012-11-27 NOTE — Telephone Encounter (Signed)
Pt in last week and was prescribed doxycycline (vibra tabs) would like this changed to plain doxycycline due to cost and sent to [facility] [place] for pharmacy. Wyatt Haste, RN-BSN

## 2012-11-27 NOTE — Telephone Encounter (Signed)
Called [facility] pharmacy who states the medication is $57 with her insurance. No difference if try capsules. This is the generic option.

## 2012-11-27 NOTE — Telephone Encounter (Signed)
Prescribed doxycycline, sent to [facility] pharmacy, and clicked that "Ok to use formulary alternative." Please inform patient and ask to call if any issues.  Simone Curia 11/27/2012 2:56 PM

## 2012-12-13 ENCOUNTER — Ambulatory Visit (INDEPENDENT_AMBULATORY_CARE_PROVIDER_SITE_OTHER): Payer: No Typology Code available for payment source | Admitting: Family Medicine

## 2012-12-13 ENCOUNTER — Encounter: Payer: Self-pay | Admitting: Family Medicine

## 2012-12-13 VITALS — BP 124/80 | HR 73 | Temp 97.8°F | Wt 221.0 lb

## 2012-12-13 DIAGNOSIS — L732 Hidradenitis suppurativa: Secondary | ICD-10-CM

## 2012-12-13 DIAGNOSIS — M2550 Pain in unspecified joint: Secondary | ICD-10-CM

## 2012-12-13 DIAGNOSIS — R21 Rash and other nonspecific skin eruption: Secondary | ICD-10-CM

## 2012-12-13 MED ORDER — TRIAMCINOLONE ACETONIDE 0.1 % EX OINT: TOPICAL_OINTMENT | Freq: Two times a day (BID) | CUTANEOUS | Status: DC

## 2012-12-13 MED ORDER — DOXYCYCLINE HYCLATE 100 MG PO TABS: 100.0000 mg | ORAL_TABLET | Freq: Two times a day (BID) | ORAL | Status: DC

## 2012-12-13 NOTE — Progress Notes (Signed)
Patient ID: Sheli Dorin, female   DOB: May 28, 1961, 52 y.o.   MRN: [id number] Subjective:   CC: Rash f/u, want to discuss workup for arthritis  HPI: Ayleah Hofmeister is a 52 y.o. female here for f/u of a rash on her palm and to discuss workup for arthritis.  1. Rash on right palm popped up - Per patient, right medial palm rash started in April as a small dog bite that she got when she tried to take something out of her puppy's mouth. Since then, it turned into a larger irritated dry patch for which she was prescribed doxycycline 7/1 which she took for 10 days and which provided some relief. She has tried changing to nonlatex gloves and putting peroxide on palm with little relief. She reports rash is not elsewhere. She states the dog is healthy currently with no signs of rabies. Tried triple abx with no improvement.  2. Joint stiffness - This occurred in May and pt was concerned about rheumatoid arthritis at that time. She took husband's prednisone and it resolved. She has had no further flares since then but was still interested in arthritis workup that we had planned to begin once she had been off prednisone for some time. Denies fevers or chills or current joint pain.  3. Hydradenitis suppurativa - Worsened as now is constant when used to be episodic. No draining areas. Doxycycline (for rash on palm) seems to have helped some more compared to bactrim she had been on for hydradenitis. Denies fevers.  Review of Systems - Per HPI.   PMH, FH, or SH: - Has been not taking bactrim for hydradenitis suppurativa because has been taking doxycycline for hand.     Objective:  Physical Exam BP 124/80  Pulse 73  Temp(Src) 97.8 F (36.6 C) (Oral)  Wt 221 lb (100.245 kg)  BMI 37.92 kg/m2 GEN: NAD, pleasant PULM: Normal effort NEURO: Awake, alert, no focal deficit, normal speech PSYCH: Mood appears euthymic, affect congruent, normal rate of speech EXTR: No edema or altered ROM SKIN: Right palm  medial side, 3x4cm, dry, half-dollar sized, cracking, mildly tender, hyperpigmented area of skin; Axillae: bilateral scattered hypopigmentation, scattered fine blackheads, left axilla with 1cm nodule, not draining, not tender   Assessment:     Nikolina Simerson is a 52 y.o. female here for f/u of a rash on her palm and to discuss workup for arthritis.    Plan:     # See problem list for problem-specific plans. - At a future visit, repeat TB skin test that had previously been done after pt was on prednisone and could alter result.

## 2012-12-13 NOTE — Assessment & Plan Note (Signed)
DDx is reaction to neomycin in triple abx vs eczematous reaction. - Stop using peroxide and triple abx. - Rx'ed triamcinolone ointment to use until resolves. - Dog bite was provoked and animal was pt's puppy and is healthy >2 months later - unlikely rabies exposure.

## 2012-12-13 NOTE — Patient Instructions (Addendum)
It was great to see you again!  For your previous muscle stiffness, - We are doing some labs today.  For your hydradenitis,  - I am prescribing doxycycline twice daily that you will use for 2-3 months. - Reduce to once daily if symptoms improve. - See me in 2 months for follow up, or sooner if it gets worse or you get fevers/chills.  For your skin rash on your hand, - Try triamcinolone ointment as prescribed. - stop using peroxide.  We are checking some labs today, and I will call you if they are abnormal. If you do not hear from me with a call or letter in 2 weeks, please call us as I may have been unable to reach you.   As you leave, make an appointment to follow up with me in 2 months.  - Bring all medications in a bag to your visits.  Take care and seek immediate care sooner than 2 months if you develop any concerns.

## 2012-12-13 NOTE — Assessment & Plan Note (Signed)
Worsened. Now constant, when used to be episodic. Some improvement with doxycycline. - D/c bactrim and rx doxycycline 100mg  BID x 2-3 months with plan to f/u 2 months to see if improving. Pt can decrease to daily if improvement noticed. - Return precautions advised; I&D if signs of fluctuant abscess formation develop.

## 2012-12-13 NOTE — Assessment & Plan Note (Signed)
DDx includes rheumatoid arthritis vs normal muscle pain vs osteoarthritis. No further episodes since last episode end of May resolved with prednisone. - F/u ANA, rheumatoid factor, and ESR ordered today.

## 2012-12-14 LAB — ANTI-NUCLEAR AB-TITER (ANA TITER): ANA Titer 1: 1:1280 {titer} — ABNORMAL HIGH

## 2012-12-14 LAB — SEDIMENTATION RATE: Sed Rate: 4 mm/hr (ref 0–22)

## 2012-12-14 LAB — ANA: Anti Nuclear Antibody(ANA): POSITIVE — AB

## 2012-12-14 LAB — RHEUMATOID FACTOR: Rhuematoid fact SerPl-aCnc: <10 IU/mL (ref <=14)

## 2012-12-18 ENCOUNTER — Ambulatory Visit (INDEPENDENT_AMBULATORY_CARE_PROVIDER_SITE_OTHER): Payer: No Typology Code available for payment source | Admitting: *Deleted

## 2012-12-18 DIAGNOSIS — Z111 Encounter for screening for respiratory tuberculosis: Secondary | ICD-10-CM

## 2012-12-18 NOTE — Progress Notes (Signed)
PPD Placement note Lowell Bouton, 52 y.o. female is here today for placement of PPD test Reason for PPD test: work Pt taken PPD test before: yes Verified in allergy area and with patient that they are not allergic to the products PPD is made of (Phenol or Tween). Yes Is patient taking any oral or IV steroid medication now or have they taken it in the last month? yes Has the patient ever received the BCG vaccine?: no Has the patient been in recent contact with anyone known or suspected of having active TB disease?: no   O: Alert and oriented in NAD. P:  PPD placed on 12/18/2012.  Patient advised to return for reading within 48-72 hours. Wyatt Haste, RN-BSN

## 2012-12-19 ENCOUNTER — Encounter: Payer: Self-pay | Admitting: Family Medicine

## 2012-12-19 ENCOUNTER — Telehealth: Payer: Self-pay | Admitting: Family Medicine

## 2012-12-19 DIAGNOSIS — R7989 Other specified abnormal findings of blood chemistry: Secondary | ICD-10-CM

## 2012-12-19 DIAGNOSIS — R768 Other specified abnormal immunological findings in serum: Secondary | ICD-10-CM | POA: Insufficient documentation

## 2012-12-19 DIAGNOSIS — R7689 Other specified abnormal immunological findings in serum: Secondary | ICD-10-CM | POA: Insufficient documentation

## 2012-12-19 NOTE — Telephone Encounter (Signed)
Called and left voicemail for patient to call back to office.   When patient calls, please let her know the following: Her lab results from her last visit show a positive ANA. Positive ANA is very nonspecific, meaning it does not point to one diagnosis and could even be falsely positive. I do not want to chase a workup at this time, because she is not currently symptomatic (joint stiffness had resolved, no other major items in problem list point toward autoimmune disease, though will want to f/u problem list item "dry cough" and hydradenitis skin rash). However, I do want to at least annually check pt's creatinine, starting with now since last value in March was elevated >1. I have put in the order and want her to schedule a lab-only appointment when convenient for her to get this done. Then, she can follow up with me when convenient to further discuss.  For MD follow-up: - DDx: False positive, SLE, drug-induced LE, undefined connective tissue disease, rheumatoid arthritis (but RF negative), limited systemic sclerosis, polymyalgia rheumatic, systemic vasculitis, syphilis, malignancy, IBD, viral illness, graves disease, hashimotos thyroiditis, autoimmune hepatitis, primary biliary cirrhosis, scleroderma, sjogrens disease, Raynaud phenomenon, polymyositis.  - Positive RPR could have been falsely positive due to an autoimmune condition - Will not chase dx but rather monitor annual creatinine (starting with now given recent elevated value in 07/2012 - future lab ordered) and proceed with workup IF pt develops symptoms. - F/u after BMET to discuss. - Ask about family history of first degree relative with autoimmune disease, which could cause positive ANA in pt without autoimmune disease.  Leona Singleton, MD

## 2012-12-20 ENCOUNTER — Encounter: Payer: Self-pay | Admitting: *Deleted

## 2012-12-20 ENCOUNTER — Ambulatory Visit: Payer: No Typology Code available for payment source | Admitting: *Deleted

## 2012-12-20 DIAGNOSIS — Z111 Encounter for screening for respiratory tuberculosis: Secondary | ICD-10-CM

## 2012-12-20 LAB — TB SKIN TEST
Induration: 0 mm
TB Skin Test: NEGATIVE

## 2012-12-20 NOTE — Progress Notes (Signed)
PPD Reading Note PPD read and results entered in EpicCare. Result: 0 mm induration. Interpretation: negative  letter given to pt regarding results. Wyatt Haste, RN-BSN

## 2013-01-14 ENCOUNTER — Ambulatory Visit: Payer: Self-pay

## 2013-01-18 ENCOUNTER — Telehealth: Payer: Self-pay | Admitting: Family Medicine

## 2013-01-18 NOTE — Telephone Encounter (Signed)
Pt called because she needed two things. One was to have Dr. Benjamin Stain call her out the lab work she had done on her last visit. Two she needed a refill of triamterene sent to [facility] on [address], She would like the prescription to be for 90 qty because it is cheaper to purchase.JW

## 2013-01-22 ENCOUNTER — Ambulatory Visit (INDEPENDENT_AMBULATORY_CARE_PROVIDER_SITE_OTHER): Payer: No Typology Code available for payment source | Admitting: Family Medicine

## 2013-01-22 VITALS — BP 124/80 | HR 72 | Temp 97.9°F | Wt 216.0 lb

## 2013-01-22 DIAGNOSIS — M2550 Pain in unspecified joint: Secondary | ICD-10-CM

## 2013-01-22 MED ORDER — TRIAMTERENE-HCTZ 37.5-25 MG PO TABS: 1.0000 | ORAL_TABLET | Freq: Every day | ORAL | Status: DC

## 2013-01-22 MED ORDER — DICLOFENAC SODIUM 75 MG PO TBEC: 75.0000 mg | DELAYED_RELEASE_TABLET | Freq: Two times a day (BID) | ORAL | Status: DC

## 2013-01-22 NOTE — Patient Instructions (Addendum)
De Quervain's Disease [name] disease is a condition often seen in racquet sports where there is a soreness (inflammation) in the cord like structures (tendons) which attach muscle to bone on the thumb side of the wrist. There may be a tightening of the tissuesaround the tendons. This condition is often helped by giving up or modifying the activity which caused it. When conservative treatment does not help, surgery may be required. Conservative treatment could include changes in the activity which brought about the problem or made it worse. Anti-inflammatory medications and injections may be used to help decrease the inflammation and help with pain control. Your caregiver will help you determine which is best for you. DIAGNOSIS  Often the diagnosis (learning what is wrong) can be made by examination. Sometimes x-rays are required. HOME CARE INSTRUCTIONS   Apply ice to the sore area for 15-20 minutes, 3-4 times per day while awake. Put the ice in a plastic bag and place a towel between the bag of ice and your skin. This is especially helpful if it can be done after all activities involving the sore wrist.  Temporary splinting may help.  Only take over-the-counter or prescription medicines for pain, discomfort or fever as directed by your caregiver. SEEK MEDICAL CARE IF:   Pain relief is not obtained with medications, or if you have increasing pain and seem to be getting worse rather than better. MAKE SURE YOU:   Understand these instructions.  Will watch your condition.  Will get help right away if you are not doing well or get worse. Document Released: 02/08/2001 Document Revised: 08/08/2011 Document Reviewed: 05/16/2005 [facility] Patient Information 2014 [place], [place].  Please follow up with Dr. Karie Schwalbe in about 2 weeks, or sooner if needed.
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
## 2013-01-23 NOTE — Assessment & Plan Note (Addendum)
ANA positive but sed normal and neg RF.  + Finkelstein on exam most consistent with DeQuervain's syndrome. Will give antiinflammatories, and place in thumb spica splints. Ice to area as needed. F/u with PCP in 2-4 weeks. May benefit from steroid injection.

## 2013-01-25 ENCOUNTER — Telehealth: Payer: Self-pay | Admitting: Family Medicine

## 2013-01-25 MED ORDER — TRIAMTERENE-HCTZ 37.5-25 MG PO TABS: 1.0000 | ORAL_TABLET | Freq: Every day | ORAL | Status: DC

## 2013-01-25 NOTE — Telephone Encounter (Signed)
Reordered triamterene-HCTZ to pt's pharmacy (90 day supply with 0 refills) but also would like to recheck BMET as was hyponatremic and Cr mildly elevated at last check in March 2014, prior to more refills. Had put in future order but pt has not had this yet.   Spoke with husband at listed home number and pt was not available. Called her mobile number which went straight to voicemail. Make follow up with me when available due to difficulty reaching her on phone, also due to need for recheck BMET; husband verbalized understanding and said he would tell Milla.  Leona Singleton, MD

## 2013-01-25 NOTE — Telephone Encounter (Signed)
See my phone note dated today. Having difficult time reaching patient by phone, so asked husband to ask her to make appointment and can discuss labs in person.

## 2013-02-01 ENCOUNTER — Telehealth: Payer: Self-pay | Admitting: Family Medicine

## 2013-02-01 MED ORDER — NAPROXEN 500 MG PO TABS: 500.0000 mg | ORAL_TABLET | Freq: Two times a day (BID) | ORAL | Status: DC

## 2013-02-01 NOTE — Telephone Encounter (Signed)
Please let her know she can try naproxen 500mg  BID. I will send an rx though I think it is OTC. I want to check her renal function after that and follow up on her kidney function so ask her to follow up in 2 weeks.  Take with food. If she has any stomach discomfort with this, she should stop taking it.  Thanks.

## 2013-02-01 NOTE — Telephone Encounter (Signed)
LMOVM for return call. Fleeger, Jessica Dawn  

## 2013-02-01 NOTE — Telephone Encounter (Signed)
Will forward to MD.  Checked with [facility] they do not carry voltaren gel. Aquilla Voiles, Maryjo Rochester

## 2013-02-01 NOTE — Telephone Encounter (Signed)
Pt called because she could not afford the Voltaren 75 mg that was prescribed. She would like something similar that is not as expensive sent to her pharmacy on file. JW

## 2013-02-12 ENCOUNTER — Encounter: Payer: Self-pay | Admitting: Family Medicine

## 2013-02-12 ENCOUNTER — Ambulatory Visit (INDEPENDENT_AMBULATORY_CARE_PROVIDER_SITE_OTHER): Payer: No Typology Code available for payment source | Admitting: Family Medicine

## 2013-02-12 VITALS — BP 120/80 | HR 64 | Temp 98.4°F | Ht 64.0 in | Wt 214.0 lb

## 2013-02-12 DIAGNOSIS — M2550 Pain in unspecified joint: Secondary | ICD-10-CM

## 2013-02-12 DIAGNOSIS — L732 Hidradenitis suppurativa: Secondary | ICD-10-CM

## 2013-02-12 DIAGNOSIS — E871 Hypo-osmolality and hyponatremia: Secondary | ICD-10-CM

## 2013-02-12 DIAGNOSIS — I10 Essential (primary) hypertension: Secondary | ICD-10-CM

## 2013-02-12 DIAGNOSIS — F319 Bipolar disorder, unspecified: Secondary | ICD-10-CM

## 2013-02-12 LAB — COMPREHENSIVE METABOLIC PANEL
ALT: 11 U/L (ref 0–35)
AST: 15 U/L (ref 0–37)
Albumin: 3.9 g/dL (ref 3.5–5.2)
Alkaline Phosphatase: 45 U/L (ref 39–117)
BUN: 14 mg/dL (ref 6–23)
CO2: 27 mEq/L (ref 19–32)
Calcium: 9.5 mg/dL (ref 8.4–10.5)
Chloride: 102 mEq/L (ref 96–112)
Creat: 0.81 mg/dL (ref 0.50–1.10)
Glucose, Bld: 78 mg/dL (ref 70–99)
Potassium: 4.1 mEq/L (ref 3.5–5.3)
Sodium: 134 mEq/L — ABNORMAL LOW (ref 135–145)
Total Bilirubin: 0.3 mg/dL (ref 0.3–1.2)
Total Protein: 7.2 g/dL (ref 6.0–8.3)

## 2013-02-12 LAB — CBC
HCT: 34.4 % — ABNORMAL LOW (ref 36.0–46.0)
Hemoglobin: 11.8 g/dL — ABNORMAL LOW (ref 12.0–15.0)
MCH: 30.4 pg (ref 26.0–34.0)
MCHC: 34.3 g/dL (ref 30.0–36.0)
MCV: 88.7 fL (ref 78.0–100.0)
Platelets: 334 10*3/uL (ref 150–400)
RBC: 3.88 MIL/uL (ref 3.87–5.11)
RDW: 13.9 % (ref 11.5–15.5)
WBC: 4.6 10*3/uL (ref 4.0–10.5)

## 2013-02-12 LAB — IRON AND TIBC
%SAT: 19 % — ABNORMAL LOW (ref 20–55)
Iron: 61 ug/dL (ref 42–145)
TIBC: 323 ug/dL (ref 250–470)
UIBC: 262 ug/dL (ref 125–400)

## 2013-02-12 NOTE — Assessment & Plan Note (Signed)
Wrist pain stable with spica spint and ibuprofen but not improved yet - Pt does not want injection but will call when she does - Continue current management

## 2013-02-12 NOTE — Assessment & Plan Note (Signed)
Still no improvement with doxycycline, but cannot afford minocycline (which used to work the best) and bactrim gives her yeast infections. - Continue doxycycline - Patient to contact me if she would like to retry bactrim

## 2013-02-12 NOTE — Assessment & Plan Note (Addendum)
Taking triamterene-HCTZ with BP well controlled today and no reported SE. - Checking BMET today given Cr bump and hyponatremia at last check

## 2013-02-12 NOTE — Assessment & Plan Note (Addendum)
On tegretol and sees psychiatrist who would like labs monitored. - Check BMET, CBC, iron panel (fe and TIBC) today and mail results to patient **Mailed letter to pt. Hgb mildly low with low % saturation, consider PO Fe and discuss colonoscopy or FOBT at f/u to look for GI source of loss** - Call if abnormal

## 2013-02-12 NOTE — Patient Instructions (Addendum)
Good to see you today!  We are checking labs (CBC, iron, and metabolic panel) and I will call if anything is NOT normal.  Follow up with me in 3 months for general health checkup. Come back sooner if wrist pain is no better and you want an injection.  Leona Singleton, MD

## 2013-02-12 NOTE — Progress Notes (Signed)
Patient ID: Stacie Daugherty, female   DOB: 28-Feb-1961, 52 y.o.   MRN: [id number] Subjective:   CC: Follow up  HPI:   1. Follow up HTN: Patient is here to follow-up on labs. She was found to have mild hyponatremia and increased creatinine at last check in March and is on triamterene-HCTZ for BP. BP is well controlled today.   She is also on tegretol for bipolar disorder with need for CMET and CBC monitoring per psychiatrist. She would like to get these checked today as well.  2. Wrist pain - Taking ibuprofen (could not afford voltaren) with some improvement but pain and swelling still present. Denies stomach discomfort with ibuprofen. Denies FH of lupus or other autoimmune disease.   Review of Systems - Per HPI.   FH: No FH lupus or other autoimmune disease    Objective:  Physical Exam BP 120/80  Pulse 64  Temp(Src) 98.4 F (36.9 C) (Oral)  Ht 5\' 4"  (1.626 m)  Wt 214 lb (97.07 kg)  BMI 36.72 kg/m2 GEN: NAD HEENT: Atraumatic, normocephalic, neck supple, EOMI, sclera clear  PULM: normal effort SKIN: No rash or cyanosis; warm and well-perfused EXTR: No lower extremity edema or calf tenderness PSYCH: Mood and affect euthymic, normal rate and volume of speech NEURO: Awake, alert, no focal deficits grossly, normal speech     Assessment:     Stacie Daugherty is a 52 y.o. female here for follow-up    Plan:     # See problem list for problem-specific plans. - Repeat TB test normal

## 2013-02-12 NOTE — Assessment & Plan Note (Signed)
Asymptomatic - Rechecking BMET today

## 2013-02-13 ENCOUNTER — Ambulatory Visit: Payer: Self-pay | Admitting: Family Medicine

## 2013-02-15 ENCOUNTER — Encounter: Payer: Self-pay | Admitting: Family Medicine

## 2013-02-15 DIAGNOSIS — D649 Anemia, unspecified: Secondary | ICD-10-CM

## 2013-02-24 DIAGNOSIS — D649 Anemia, unspecified: Secondary | ICD-10-CM | POA: Insufficient documentation

## 2013-04-30 ENCOUNTER — Telehealth: Payer: Self-pay | Admitting: Family Medicine

## 2013-04-30 NOTE — Telephone Encounter (Signed)
Please call patient to come into clinic for follow up of her hidradenitis. Abx are recommended for 2 months followed by re-evaluation. She had seen me in September 2014.  Leona Singleton, MD 04/30/2013 7:01 PM

## 2013-05-01 NOTE — Telephone Encounter (Signed)
LM for pt to call back and make a follow up appt. Stacie Daugherty,CMA

## 2013-05-06 ENCOUNTER — Ambulatory Visit (INDEPENDENT_AMBULATORY_CARE_PROVIDER_SITE_OTHER): Payer: No Typology Code available for payment source | Admitting: Family Medicine

## 2013-05-06 ENCOUNTER — Encounter: Payer: Self-pay | Admitting: Family Medicine

## 2013-05-06 VITALS — BP 132/83 | HR 73 | Temp 98.8°F | Ht 64.0 in | Wt 215.0 lb

## 2013-05-06 DIAGNOSIS — Z23 Encounter for immunization: Secondary | ICD-10-CM

## 2013-05-06 DIAGNOSIS — I10 Essential (primary) hypertension: Secondary | ICD-10-CM

## 2013-05-06 DIAGNOSIS — D649 Anemia, unspecified: Secondary | ICD-10-CM

## 2013-05-06 DIAGNOSIS — M2550 Pain in unspecified joint: Secondary | ICD-10-CM

## 2013-05-06 MED ORDER — NAPROXEN 500 MG PO TABS: 500.0000 mg | ORAL_TABLET | Freq: Two times a day (BID) | ORAL | Status: DC

## 2013-05-06 MED ORDER — TRIAMTERENE-HCTZ 37.5-25 MG PO TABS: 1.0000 | ORAL_TABLET | Freq: Every day | ORAL | Status: DC

## 2013-05-06 NOTE — Progress Notes (Signed)
Patient ID: Stacie Daugherty, female   DOB: 09/08/1960, 52 y.o.   MRN: [id number] Subjective:   CC: F/u wrist pain  HPI:   1. F/u wrist pain - Was diagnosed at last visit with tenosynovitis. Pain unchanged in bilateral wrists, started 7 months ago. Wrists look a little swollen to patient. Worse with passive or active wrist extension, currently 6/10. Pt denies erythema. Wrist splints help. She has not needed tylenol. Right hand hurts worse especially with fine motor movements like detangling hair or writing. She has not tried medication. Denies fevers, chills, grinding sensation. She was not able to pick up naproxen because it was sent to the wrong pharmacy.  2. Hidradenitis - Pt switched to bactrim due to cost and this is working all right. Denies major outbreak currently.  Review of Systems - Per HPI.   PMH: Hidradenitis.    Med review:  - Not taking zyrtec because not bothered by allergies, but still has zyrtec. - Not doing doxy. - removed from list. - Started bactrim BID because cheaper.  Objective:  Physical Exam BP 132/83  Pulse 73  Temp(Src) 98.8 F (37.1 C) (Oral)  Ht 5\' 4"  (1.626 m)  Wt 215 lb (97.523 kg)  BMI 36.89 kg/m2 GEN: NAD, pleasant EXTR: Bilateral wrists with ?mild inflammation, no erythema, right tenderness > left tenderness to palpation, extension limited by pain. No deformity. SKIN: No current rash or cyanosis    Assessment:     Stacie Daugherty is a 52 y.o. female here for follow up of wrist pain.    Plan:     # See problem list and after visit summary for problem-specific plans. - Of note, due for follow up of hidradenitis.  # Health Maintenance:  - Ordered home stool cards because pt cannot afford colonoscopy - Flu shot today.  Follow-up: Follow up in 2w for wrist f/u and to f/u hidradenitis.  Leona Singleton, MD [facility]

## 2013-05-06 NOTE — Patient Instructions (Signed)
Great to see you today.  For your wrist,  - I [facility]. Take with food. - Come back in 2 weeks to see how you are doing.  For your hidradenitis, we need to follow up on this. - We can f/u when you come back in 2 weeks.  For your BP, I have refilled your HCTZ-triamterene.  You are getting a flu shot today.  We are giving you a take-home stool card to fill out and bring back.  Leona Singleton, MD

## 2013-05-10 NOTE — Assessment & Plan Note (Signed)
With positive finklestein at last visit, likely DeQuervain Tenosynovitis. - Try aleve BID or naproxen that was resent to correct pharmacy. - Consider steroid injection at follow up - F/u in 2 weeks

## 2013-05-10 NOTE — Assessment & Plan Note (Signed)
Well-controlled today. - Refilled HCTZ-triamterene.

## 2013-08-07 ENCOUNTER — Other Ambulatory Visit: Payer: Self-pay | Admitting: Family Medicine

## 2013-08-07 ENCOUNTER — Other Ambulatory Visit (HOSPITAL_COMMUNITY)
Admission: RE | Admit: 2013-08-07 | Discharge: 2013-08-07 | Disposition: A | Discharge status: Home / Self Care | Payer: 59 | Source: Ambulatory Visit | Attending: Family Medicine | Admitting: Family Medicine

## 2013-08-07 DIAGNOSIS — Z113 Encounter for screening for infections with a predominantly sexual mode of transmission: Secondary | ICD-10-CM | POA: Insufficient documentation

## 2013-08-07 DIAGNOSIS — Z1151 Encounter for screening for human papillomavirus (HPV): Secondary | ICD-10-CM | POA: Insufficient documentation

## 2013-08-07 DIAGNOSIS — Z124 Encounter for screening for malignant neoplasm of cervix: Secondary | ICD-10-CM | POA: Insufficient documentation

## 2013-10-04 ENCOUNTER — Ambulatory Visit: Payer: Self-pay

## 2013-11-26 ENCOUNTER — Ambulatory Visit: Payer: No Typology Code available for payment source

## 2014-01-01 ENCOUNTER — Ambulatory Visit (INDEPENDENT_AMBULATORY_CARE_PROVIDER_SITE_OTHER): Payer: No Typology Code available for payment source | Admitting: Family Medicine

## 2014-01-01 ENCOUNTER — Other Ambulatory Visit: Payer: Self-pay | Admitting: Family Medicine

## 2014-01-01 ENCOUNTER — Encounter: Payer: Self-pay | Admitting: Family Medicine

## 2014-01-01 VITALS — BP 137/77 | HR 76 | Temp 98.1°F | Resp 16 | Ht 63.0 in | Wt 228.0 lb

## 2014-01-01 DIAGNOSIS — Z1231 Encounter for screening mammogram for malignant neoplasm of breast: Secondary | ICD-10-CM

## 2014-01-01 DIAGNOSIS — E8881 Metabolic syndrome: Secondary | ICD-10-CM

## 2014-01-01 DIAGNOSIS — R05 Cough: Secondary | ICD-10-CM

## 2014-01-01 DIAGNOSIS — R058 Other specified cough: Secondary | ICD-10-CM

## 2014-01-01 DIAGNOSIS — L304 Erythema intertrigo: Secondary | ICD-10-CM

## 2014-01-01 DIAGNOSIS — R059 Cough, unspecified: Secondary | ICD-10-CM

## 2014-01-01 DIAGNOSIS — E669 Obesity, unspecified: Secondary | ICD-10-CM

## 2014-01-01 DIAGNOSIS — L538 Other specified erythematous conditions: Secondary | ICD-10-CM

## 2014-01-01 DIAGNOSIS — I10 Essential (primary) hypertension: Secondary | ICD-10-CM

## 2014-01-01 DIAGNOSIS — F319 Bipolar disorder, unspecified: Secondary | ICD-10-CM

## 2014-01-01 LAB — TSH: TSH: 0.334 u[IU]/mL — ABNORMAL LOW (ref 0.350–4.500)

## 2014-01-01 LAB — COMPLETE METABOLIC PANEL WITH GFR
ALT: 16 U/L (ref 0–35)
AST: 17 U/L (ref 0–37)
Albumin: 4.3 g/dL (ref 3.5–5.2)
Alkaline Phosphatase: 47 U/L (ref 39–117)
BUN: 13 mg/dL (ref 6–23)
CO2: 29 mEq/L (ref 19–32)
Calcium: 9.9 mg/dL (ref 8.4–10.5)
Chloride: 103 mEq/L (ref 96–112)
Creat: 0.79 mg/dL (ref 0.50–1.10)
GFR, Est African American: >89 mL/min
GFR, Est Non African American: 86 mL/min
Glucose, Bld: 77 mg/dL (ref 70–99)
Potassium: 4.1 mEq/L (ref 3.5–5.3)
Sodium: 139 mEq/L (ref 135–145)
Total Bilirubin: 0.3 mg/dL (ref 0.2–1.2)
Total Protein: 7.5 g/dL (ref 6.0–8.3)

## 2014-01-01 MED ORDER — NYSTATIN 100000 UNIT/GM EX CREA: 1.0000 "application " | TOPICAL_CREAM | Freq: Two times a day (BID) | CUTANEOUS | Status: DC

## 2014-01-01 MED ORDER — CETIRIZINE HCL 10 MG PO TABS: 10.0000 mg | ORAL_TABLET | Freq: Every day | ORAL | Status: DC

## 2014-01-01 MED ORDER — TRIAMTERENE-HCTZ 37.5-25 MG PO TABS: 1.0000 | ORAL_TABLET | Freq: Every day | ORAL | Status: DC

## 2014-01-01 NOTE — Progress Notes (Signed)
Subjective:    Patient ID: Stacie Daugherty, female    DOB: Nov 16, 1960, 53 y.o.   MRN: [id number]  HPI  [name] presents to establish care. She reports that she was followed by [employer] at [facility], prior to losing insurance benefits several months ago.    Patient is here for evaluation of hypertension.  She states that she was diagnosed with high blood pressure several years ago. Blood pressure has been controlled on current medication regimen. Patient denies chest pain, chest pressure/discomfort, claudication, dyspnea, exertional chest pressure/discomfort, fatigue, irregular heart beat, palpitations and syncope.  Cardiovascular risk factors: hypertension, obesity (BMI >= 30 kg/m2) and sedentary lifestyle. Patient denies a history of target organ damage.   Patient complaining of left foot callous for 2 months. Patient states that callous occurred after wearing a specific pair of sandals without foot pads. [name] states that she has utilized alcohol and peroxide to soften the callous with unsatisfactory results. Patient denies a history of diabetes, decreased circulation, lower extremity edema, or neuropathy.    [name] also reports a history of bipolar disorder. She states that she has a strong family history of bipolar disorder and was diagnosed many years ago. She is currently followed by [facility], where she is evaluated by Pauline Good, NP every 6-8 weeks. She reports that she last had a psychotic break 5 years ago. She denies depression, anxiety, suicidal or homicidal ideations.    Past Medical History  Diagnosis Date  . Hypertension   . Bipolar 1 disorder     Hospitalized multiple times for bipolar disorder (DC - [facility], [facility], and [facility], most recently in [place])  . Hidradenitis suppurativa    Review of Systems  Constitutional: Negative.   HENT: Negative.   Eyes: Negative.   Respiratory:  Negative.   Cardiovascular: Negative.   Gastrointestinal: Negative.   Endocrine: Negative for cold intolerance.  Genitourinary: Negative.   Musculoskeletal: Positive for arthralgias (right wrist) and myalgias (foot pain).  Skin: Positive for wound (Callous of left foot).  Allergic/Immunologic: Positive for environmental allergies.  Neurological: Negative.   Hematological: Negative.   Psychiatric/Behavioral: Negative.        Objective:   Physical Exam  Constitutional: She is oriented to person, place, and time. She appears well-developed and well-nourished.  HENT:  Head: Normocephalic and atraumatic.  Right Ear: External ear normal.  Left Ear: External ear normal.  Eyes: Conjunctivae are normal. Pupils are equal, round, and reactive to light.  Neck: Normal range of motion. Neck supple.  Cardiovascular: Normal rate, regular rhythm, normal heart sounds and intact distal pulses.   Pulmonary/Chest: Effort normal and breath sounds normal.  Abdominal: Soft. Bowel sounds are normal.  Musculoskeletal: Normal range of motion.  Neurological: She is alert and oriented to person, place, and time. She has normal reflexes.  Skin: Skin is warm, dry and intact.     Psychiatric: She has a normal mood and affect. Her speech is normal and behavior is normal. Judgment and thought content normal.         BP 137/77  Pulse 76  Temp(Src) 98.1 F (36.7 C) (Oral)  Resp 16  Ht 5\' 3"  (1.6 m)  Wt 228 lb (103.42 kg)  BMI 40.40 kg/m2 Assessment & Plan:  1. Hypertension: Stable. Patient consistently taking Hydrochlorothiazide 37.5-25 mg, refilled current Rx.  Recommend walking 3 times per week. Drink 6-8 glasses. Diet 2000 divided over 6 small meal. CMP and urinalysis. Will  2. Intertrigo- Irritation, hyperpigmentation under breast folds. She is to apply Nystatin cream to clean dry skin at night. Recommended that patient wear a cotton bra to impede moisture during the day. Also, refrain from sleeping  in a bra 3.  Bipolar disorder: Stable on current Medication regimen. Continue appointments as scheduled with Pauline Good, NP where she  is followed every 6-8 weeks.  4.  Metabolic syndrome:  BMI is currently 40.4. Patient states that she is currently sedentary and does not follow a balanced diet. Patient given written materials on a 2000 calorie diet. Also, recommend starting a walking regimen of 3 times per week. Check for hgbA1c, TSH  5. Left foot callous:  Recommend that she soak foot daily in warm water bath. Also, apply emmollient daily ( recommend OTC vaseline). Will also check HbA1C  6. Allergic cough: Patient has a history of an allergic cough that is currently controlled on daily Cetirizine 10 mg. Will re-order.   RTC: 3 months for hypertension follow-up   CPE: 4-5 months ago Last pap smear-June 25, 2013, normal per patient. Will review medical records as they become available.  Vaccinations: Last tetanus in January 2007 Mammogram: Referral for mammogram   Meds ordered this encounter  Medications  . nystatin cream (MYCOSTATIN)    Sig: Apply 1 application topically 2 (two) times daily.    Dispense:  30 g    Refill:  0    Order Specific Question:  Supervising Provider    Answer:  MATTHEWS, MICHELLE A [[id number]]  . cetirizine (ZYRTEC) 10 MG tablet    Sig: Take 1 tablet (10 mg total) by mouth daily.    Dispense:  30 tablet    Refill:  2    Order Specific Question:  Supervising Provider    Answer:  MATTHEWS, MICHELLE A [[id number]]  . triamterene-hydrochlorothiazide (MAXZIDE-25) 37.5-25 MG per tablet    Sig: Take 1 tablet by mouth daily. Follow up with PCP for labs.    Dispense:  90 tablet    Refill:  0    Order Specific Question:  Supervising Provider    Answer:  Liston Alba A [[id number]]     [name], FNP

## 2014-01-02 LAB — URINALYSIS, COMPLETE
Casts: NONE SEEN
Glucose, UA: NEGATIVE mg/dL
Hgb urine dipstick: NEGATIVE
Ketones, ur: NEGATIVE mg/dL
Leukocytes, UA: NEGATIVE
Nitrite: NEGATIVE
Protein, ur: NEGATIVE mg/dL
Specific Gravity, Urine: >1.03 — ABNORMAL HIGH (ref 1.005–1.030)
Urobilinogen, UA: 0.2 mg/dL (ref 0.0–1.0)
pH: 5 (ref 5.0–8.0)

## 2014-01-02 LAB — HEMOGLOBIN A1C
Hgb A1c MFr Bld: 5.6 % (ref <5.7)
Mean Plasma Glucose: 114 mg/dL (ref <117)

## 2014-01-03 ENCOUNTER — Telehealth: Payer: Self-pay | Admitting: Family Medicine

## 2014-01-03 NOTE — Telephone Encounter (Signed)
TSH decreased, notified lab to add on Free T3 and T4

## 2014-01-04 LAB — T4, FREE: Free T4: 1.08 ng/dL (ref 0.80–1.80)

## 2014-01-04 LAB — T3, FREE: T3, Free: 2.4 pg/mL (ref 2.3–4.2)

## 2014-01-10 ENCOUNTER — Telehealth: Payer: Self-pay | Admitting: Internal Medicine

## 2014-01-10 ENCOUNTER — Other Ambulatory Visit: Payer: Self-pay | Admitting: Internal Medicine

## 2014-01-10 DIAGNOSIS — Z1231 Encounter for screening mammogram for malignant neoplasm of breast: Secondary | ICD-10-CM

## 2014-01-10 NOTE — Telephone Encounter (Signed)
Patient calling to check the status on the referral for a mammogram.

## 2014-01-13 ENCOUNTER — Telehealth: Payer: Self-pay

## 2014-01-13 NOTE — Telephone Encounter (Signed)
Left message for patient to see where she wanted to get her mammogram from.

## 2014-01-14 NOTE — Telephone Encounter (Signed)
Called to ask where she wanted mammogram done at, no answer. Left message. Thanks!

## 2014-02-04 ENCOUNTER — Ambulatory Visit (HOSPITAL_COMMUNITY): Payer: No Typology Code available for payment source | Attending: Internal Medicine

## 2014-02-14 ENCOUNTER — Ambulatory Visit (HOSPITAL_COMMUNITY)
Admission: RE | Admit: 2014-02-14 | Discharge: 2014-02-14 | Disposition: A | Discharge status: Home / Self Care | Payer: No Typology Code available for payment source | Source: Ambulatory Visit | Attending: Internal Medicine | Admitting: Internal Medicine

## 2014-02-14 DIAGNOSIS — Z1231 Encounter for screening mammogram for malignant neoplasm of breast: Secondary | ICD-10-CM | POA: Insufficient documentation

## 2014-03-31 ENCOUNTER — Encounter: Payer: Self-pay | Admitting: Family Medicine

## 2014-04-07 ENCOUNTER — Ambulatory Visit: Payer: No Typology Code available for payment source | Admitting: Internal Medicine

## 2014-06-30 ENCOUNTER — Ambulatory Visit: Payer: No Typology Code available for payment source

## 2014-08-03 ENCOUNTER — Encounter (HOSPITAL_COMMUNITY): Payer: Self-pay | Admitting: Emergency Medicine

## 2014-08-03 ENCOUNTER — Emergency Department (HOSPITAL_COMMUNITY)
Admission: EM | Admit: 2014-08-03 | Discharge: 2014-08-04 | Disposition: A | Discharge status: Home / Self Care | Payer: No Typology Code available for payment source | Attending: Emergency Medicine | Admitting: Emergency Medicine

## 2014-08-03 DIAGNOSIS — Z046 Encounter for general psychiatric examination, requested by authority: Secondary | ICD-10-CM | POA: Diagnosis present

## 2014-08-03 DIAGNOSIS — Z872 Personal history of diseases of the skin and subcutaneous tissue: Secondary | ICD-10-CM | POA: Insufficient documentation

## 2014-08-03 DIAGNOSIS — Z3202 Encounter for pregnancy test, result negative: Secondary | ICD-10-CM | POA: Diagnosis not present

## 2014-08-03 DIAGNOSIS — Z79899 Other long term (current) drug therapy: Secondary | ICD-10-CM | POA: Diagnosis not present

## 2014-08-03 DIAGNOSIS — Z791 Long term (current) use of non-steroidal anti-inflammatories (NSAID): Secondary | ICD-10-CM | POA: Diagnosis not present

## 2014-08-03 DIAGNOSIS — F319 Bipolar disorder, unspecified: Secondary | ICD-10-CM | POA: Diagnosis not present

## 2014-08-03 DIAGNOSIS — Z87891 Personal history of nicotine dependence: Secondary | ICD-10-CM | POA: Diagnosis not present

## 2014-08-03 DIAGNOSIS — I10 Essential (primary) hypertension: Secondary | ICD-10-CM | POA: Insufficient documentation

## 2014-08-03 LAB — RAPID URINE DRUG SCREEN, HOSP PERFORMED
Amphetamines: NOT DETECTED
Barbiturates: NOT DETECTED
Benzodiazepines: NOT DETECTED
Cocaine: NOT DETECTED
Opiates: NOT DETECTED
Tetrahydrocannabinol: NOT DETECTED

## 2014-08-03 LAB — CBC WITH DIFFERENTIAL/PLATELET
Basophils Absolute: 0.1 10*3/uL (ref 0.0–0.1)
Basophils Relative: 1 % (ref 0–1)
Eosinophils Absolute: 0.1 10*3/uL (ref 0.0–0.7)
Eosinophils Relative: 2 % (ref 0–5)
HCT: 35.9 % — ABNORMAL LOW (ref 36.0–46.0)
Hemoglobin: 12.1 g/dL (ref 12.0–15.0)
Lymphocytes Relative: 37 % (ref 12–46)
Lymphs Abs: 1.9 10*3/uL (ref 0.7–4.0)
MCH: 31.4 pg (ref 26.0–34.0)
MCHC: 33.7 g/dL (ref 30.0–36.0)
MCV: 93.2 fL (ref 78.0–100.0)
Monocytes Absolute: 0.6 10*3/uL (ref 0.1–1.0)
Monocytes Relative: 11 % (ref 3–12)
Neutro Abs: 2.3 10*3/uL (ref 1.7–7.7)
Neutrophils Relative %: 49 % (ref 43–77)
Platelets: 259 10*3/uL (ref 150–400)
RBC: 3.85 MIL/uL — ABNORMAL LOW (ref 3.87–5.11)
RDW: 12.8 % (ref 11.5–15.5)
WBC: 5 10*3/uL (ref 4.0–10.5)

## 2014-08-03 LAB — SALICYLATE LEVEL: Salicylate Lvl: <4 mg/dL (ref 2.8–20.0)

## 2014-08-03 LAB — COMPREHENSIVE METABOLIC PANEL
ALT: 67 U/L — ABNORMAL HIGH (ref 0–35)
AST: 74 U/L — ABNORMAL HIGH (ref 0–37)
Albumin: 4.1 g/dL (ref 3.5–5.2)
Alkaline Phosphatase: 43 U/L (ref 39–117)
Anion gap: 7 (ref 5–15)
BUN: 15 mg/dL (ref 6–23)
CO2: 27 mmol/L (ref 19–32)
Calcium: 9 mg/dL (ref 8.4–10.5)
Chloride: 103 mmol/L (ref 96–112)
Creatinine, Ser: 1.02 mg/dL (ref 0.50–1.10)
GFR calc Af Amer: 72 mL/min — ABNORMAL LOW (ref >90)
GFR calc non Af Amer: 62 mL/min — ABNORMAL LOW (ref >90)
Glucose, Bld: 97 mg/dL (ref 70–99)
Potassium: 3.4 mmol/L — ABNORMAL LOW (ref 3.5–5.1)
Sodium: 137 mmol/L (ref 135–145)
Total Bilirubin: 0.8 mg/dL (ref 0.3–1.2)
Total Protein: 7.8 g/dL (ref 6.0–8.3)

## 2014-08-03 LAB — ETHANOL: Alcohol, Ethyl (B): <5 mg/dL (ref 0–9)

## 2014-08-03 LAB — ACETAMINOPHEN LEVEL: Acetaminophen (Tylenol), Serum: <10 ug/mL — ABNORMAL LOW (ref 10–30)

## 2014-08-03 LAB — PREGNANCY, URINE: Preg Test, Ur: NEGATIVE

## 2014-08-03 MED ORDER — LORAZEPAM 1 MG PO TABS: 1.0000 mg | ORAL_TABLET | Freq: Three times a day (TID) | ORAL | Status: DC | PRN

## 2014-08-03 MED ORDER — OLANZAPINE 2.5 MG PO TABS
2.5000 mg | ORAL_TABLET | Freq: Every day | ORAL | Status: DC
  Administered 2014-08-03: 2.5 mg via ORAL
  Filled 2014-08-03: qty 1

## 2014-08-03 MED ORDER — ALUM & MAG HYDROXIDE-SIMETH 200-200-20 MG/5ML PO SUSP: 30.0000 mL | ORAL | Status: DC | PRN

## 2014-08-03 MED ORDER — ZIPRASIDONE MESYLATE 20 MG IM SOLR
10.0000 mg | Freq: Once | INTRAMUSCULAR | Status: AC
  Administered 2014-08-03: 10 mg via INTRAMUSCULAR
  Filled 2014-08-03: qty 20

## 2014-08-03 MED ORDER — ONDANSETRON HCL 4 MG PO TABS: 4.0000 mg | ORAL_TABLET | Freq: Three times a day (TID) | ORAL | Status: DC | PRN

## 2014-08-03 MED ORDER — IBUPROFEN 200 MG PO TABS: 600.0000 mg | ORAL_TABLET | Freq: Three times a day (TID) | ORAL | Status: DC | PRN

## 2014-08-03 MED ORDER — DIPHENHYDRAMINE HCL 50 MG/ML IJ SOLN
50.0000 mg | Freq: Once | INTRAMUSCULAR | Status: AC
  Administered 2014-08-03: 50 mg via INTRAMUSCULAR
  Filled 2014-08-03: qty 1

## 2014-08-03 MED ORDER — NICOTINE 21 MG/24HR TD PT24: 21.0000 mg | MEDICATED_PATCH | Freq: Every day | TRANSDERMAL | Status: DC

## 2014-08-03 MED ORDER — LORAZEPAM 2 MG/ML IJ SOLN
INTRAMUSCULAR | Status: AC
  Administered 2014-08-03: 2 mg
  Filled 2014-08-03: qty 1

## 2014-08-03 MED ORDER — TRIAMTERENE-HCTZ 37.5-25 MG PO TABS
1.0000 | ORAL_TABLET | Freq: Every day | ORAL | Status: DC
  Administered 2014-08-03 – 2014-08-04 (×2): 1 via ORAL
  Filled 2014-08-03 (×2): qty 1

## 2014-08-03 MED ORDER — LORAZEPAM 2 MG/ML IJ SOLN: 2.0000 mg | Freq: Once | INTRAMUSCULAR | Status: AC

## 2014-08-03 MED ORDER — ACETAMINOPHEN 325 MG PO TABS
650.0000 mg | ORAL_TABLET | ORAL | Status: DC | PRN
  Filled 2014-08-03: qty 2

## 2014-08-03 MED ORDER — CARBAMAZEPINE 200 MG PO TABS
500.0000 mg | ORAL_TABLET | Freq: Every day | ORAL | Status: DC
  Administered 2014-08-03: 500 mg via ORAL
  Filled 2014-08-03 (×2): qty 2.5

## 2014-08-03 MED ORDER — TRAZODONE HCL 50 MG PO TABS
150.0000 mg | ORAL_TABLET | Freq: Every day | ORAL | Status: DC
  Administered 2014-08-03: 150 mg via ORAL
  Filled 2014-08-03 (×3): qty 1

## 2014-08-03 MED ORDER — ZOLPIDEM TARTRATE 5 MG PO TABS: 5.0000 mg | ORAL_TABLET | Freq: Every evening | ORAL | Status: DC | PRN

## 2014-08-03 MED ORDER — ACETAMINOPHEN 325 MG PO TABS: 650.0000 mg | ORAL_TABLET | Freq: Once | ORAL | Status: DC

## 2014-08-03 MED ORDER — LORATADINE 10 MG PO TABS
10.0000 mg | ORAL_TABLET | Freq: Every day | ORAL | Status: DC
  Filled 2014-08-03 (×2): qty 1

## 2014-08-03 NOTE — BHH Counselor (Addendum)
TTS Counselor faxed referral to the following facilities in effort to obtain inpt placement:  Stacie Daugherty
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
## 2014-08-03 NOTE — ED Notes (Signed)
Pt resting on stretcher, husband visited pt briefly.  Pt sleeping at present, no distress noted, AAO x 3, will continue to monitor for safety.

## 2014-08-03 NOTE — ED Notes (Signed)
IVC PAPER PRESENT/ [employer] AND SECURITY PRESENT

## 2014-08-03 NOTE — ED Notes (Signed)
TRANSFERRED TO [facility]
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
## 2014-08-04 ENCOUNTER — Inpatient Hospital Stay (HOSPITAL_COMMUNITY)
Admission: AD | Admit: 2014-08-04 | Discharge: 2014-08-19 | DRG: 885 | Disposition: A | Discharge status: Home / Self Care | Payer: No Typology Code available for payment source | Attending: Psychiatry | Admitting: Psychiatry

## 2014-08-04 ENCOUNTER — Encounter (HOSPITAL_COMMUNITY): Payer: Self-pay | Admitting: *Deleted

## 2014-08-04 DIAGNOSIS — R740 Nonspecific elevation of levels of transaminase and lactic acid dehydrogenase [LDH]: Secondary | ICD-10-CM

## 2014-08-04 DIAGNOSIS — R600 Localized edema: Secondary | ICD-10-CM | POA: Diagnosis present

## 2014-08-04 DIAGNOSIS — Z87891 Personal history of nicotine dependence: Secondary | ICD-10-CM | POA: Diagnosis not present

## 2014-08-04 DIAGNOSIS — E871 Hypo-osmolality and hyponatremia: Secondary | ICD-10-CM | POA: Diagnosis present

## 2014-08-04 DIAGNOSIS — Z9114 Patient's other noncompliance with medication regimen: Secondary | ICD-10-CM | POA: Diagnosis not present

## 2014-08-04 DIAGNOSIS — F319 Bipolar disorder, unspecified: Secondary | ICD-10-CM | POA: Diagnosis not present

## 2014-08-04 DIAGNOSIS — F419 Anxiety disorder, unspecified: Secondary | ICD-10-CM | POA: Diagnosis present

## 2014-08-04 DIAGNOSIS — R058 Other specified cough: Secondary | ICD-10-CM

## 2014-08-04 DIAGNOSIS — E785 Hyperlipidemia, unspecified: Secondary | ICD-10-CM | POA: Diagnosis present

## 2014-08-04 DIAGNOSIS — Z818 Family history of other mental and behavioral disorders: Secondary | ICD-10-CM

## 2014-08-04 DIAGNOSIS — R05 Cough: Secondary | ICD-10-CM

## 2014-08-04 DIAGNOSIS — Z809 Family history of malignant neoplasm, unspecified: Secondary | ICD-10-CM

## 2014-08-04 DIAGNOSIS — F3111 Bipolar disorder, current episode manic without psychotic features, mild: Secondary | ICD-10-CM | POA: Diagnosis present

## 2014-08-04 DIAGNOSIS — G4700 Insomnia, unspecified: Secondary | ICD-10-CM | POA: Diagnosis present

## 2014-08-04 DIAGNOSIS — R7401 Elevation of levels of liver transaminase levels: Secondary | ICD-10-CM

## 2014-08-04 DIAGNOSIS — F3112 Bipolar disorder, current episode manic without psychotic features, moderate: Secondary | ICD-10-CM | POA: Insufficient documentation

## 2014-08-04 DIAGNOSIS — E7800 Pure hypercholesterolemia, unspecified: Secondary | ICD-10-CM | POA: Diagnosis present

## 2014-08-04 DIAGNOSIS — I10 Essential (primary) hypertension: Secondary | ICD-10-CM | POA: Diagnosis present

## 2014-08-04 DIAGNOSIS — F312 Bipolar disorder, current episode manic severe with psychotic features: Principal | ICD-10-CM | POA: Diagnosis present

## 2014-08-04 MED ORDER — OLANZAPINE 2.5 MG PO TABS
2.5000 mg | ORAL_TABLET | Freq: Every day | ORAL | Status: DC
  Administered 2014-08-04: 2.5 mg via ORAL
  Filled 2014-08-04 (×4): qty 1

## 2014-08-04 MED ORDER — TRIAMTERENE-HCTZ 37.5-25 MG PO TABS
1.0000 | ORAL_TABLET | Freq: Every day | ORAL | Status: DC
  Administered 2014-08-05 – 2014-08-07 (×3): 1 via ORAL
  Filled 2014-08-04 (×7): qty 1

## 2014-08-04 MED ORDER — ACETAMINOPHEN 325 MG PO TABS
650.0000 mg | ORAL_TABLET | Freq: Four times a day (QID) | ORAL | Status: DC | PRN
Start: 2014-08-04 — End: 2014-08-19
  Administered 2014-08-07 – 2014-08-18 (×11): 650 mg via ORAL
  Filled 2014-08-04 (×12): qty 2

## 2014-08-04 MED ORDER — MAGNESIUM HYDROXIDE 400 MG/5ML PO SUSP: 30.0000 mL | Freq: Every day | ORAL | Status: DC | PRN

## 2014-08-04 MED ORDER — ALUM & MAG HYDROXIDE-SIMETH 200-200-20 MG/5ML PO SUSP: 30.0000 mL | ORAL | Status: DC | PRN

## 2014-08-04 MED ORDER — TRAZODONE HCL 150 MG PO TABS
150.0000 mg | ORAL_TABLET | Freq: Every day | ORAL | Status: DC
  Administered 2014-08-04: 150 mg via ORAL
  Filled 2014-08-04 (×2): qty 1

## 2014-08-04 NOTE — Progress Notes (Signed)
The focus of this group is to help patients review their daily goal of treatment and discuss progress on daily workbooks. Pt attended the evening group session but had trouble responding on-topic to discussion prompts from the writer. Pt instead wanted to talk about the circumstances that brought her to the hospital despite the [facility] telling her the group was not about that. Pt was frequently intrusive during wrap-up, interrupting her peers, the Writer and making inappropriate comments such as telling a peer, "You should take your energy and join the [employer]. I know they're violent, but they do good too." Pt required frequent redirection and was not able to remain quiet for long. Pt's attitude was positive, however, and she did not become agitated or frustrated during group, even when being redirected to stop talking over people.

## 2014-08-04 NOTE — ED Notes (Signed)
Husband called to check on wife.  Contact number [phone].
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
## 2014-08-05 ENCOUNTER — Encounter (HOSPITAL_COMMUNITY): Payer: Self-pay | Admitting: Psychiatry

## 2014-08-05 DIAGNOSIS — F312 Bipolar disorder, current episode manic severe with psychotic features: Secondary | ICD-10-CM | POA: Diagnosis present

## 2014-08-05 DIAGNOSIS — Z9114 Patient's other noncompliance with medication regimen: Secondary | ICD-10-CM

## 2014-08-05 MED ORDER — OLANZAPINE 5 MG PO TBDP: 5.0000 mg | ORAL_TABLET | Freq: Four times a day (QID) | ORAL | Status: DC | PRN

## 2014-08-05 MED ORDER — IBUPROFEN 600 MG PO TABS
600.0000 mg | ORAL_TABLET | Freq: Four times a day (QID) | ORAL | Status: DC | PRN
  Administered 2014-08-05 – 2014-08-06 (×4): 600 mg via ORAL
  Filled 2014-08-05 (×4): qty 1

## 2014-08-05 MED ORDER — GABAPENTIN 100 MG PO CAPS
100.0000 mg | ORAL_CAPSULE | Freq: Three times a day (TID) | ORAL | Status: DC
  Administered 2014-08-06: 100 mg via ORAL
  Filled 2014-08-05 (×6): qty 1

## 2014-08-05 MED ORDER — ARIPIPRAZOLE 5 MG PO TABS
5.0000 mg | ORAL_TABLET | Freq: Every day | ORAL | Status: DC
  Filled 2014-08-05 (×3): qty 1

## 2014-08-05 MED ORDER — QUETIAPINE FUMARATE 100 MG PO TABS
100.0000 mg | ORAL_TABLET | Freq: Every day | ORAL | Status: DC
  Administered 2014-08-05: 100 mg via ORAL
  Filled 2014-08-05 (×2): qty 1

## 2014-08-05 MED ORDER — LAMOTRIGINE 25 MG PO TABS
50.0000 mg | ORAL_TABLET | Freq: Every day | ORAL | Status: DC
  Filled 2014-08-05: qty 2

## 2014-08-05 MED ORDER — LAMOTRIGINE 25 MG PO TABS
25.0000 mg | ORAL_TABLET | Freq: Every day | ORAL | Status: DC
  Administered 2014-08-05 – 2014-08-10 (×6): 25 mg via ORAL
  Filled 2014-08-05 (×9): qty 1

## 2014-08-05 MED ORDER — TRAZODONE HCL 150 MG PO TABS
150.0000 mg | ORAL_TABLET | Freq: Every evening | ORAL | Status: DC | PRN
  Administered 2014-08-05: 150 mg via ORAL
  Filled 2014-08-05 (×5): qty 1

## 2014-08-05 MED ORDER — LORATADINE 10 MG PO TABS
10.0000 mg | ORAL_TABLET | Freq: Every day | ORAL | Status: DC
  Administered 2014-08-05 – 2014-08-18 (×14): 10 mg via ORAL
  Filled 2014-08-05 (×2): qty 1
  Filled 2014-08-05: qty 4
  Filled 2014-08-05 (×16): qty 1

## 2014-08-05 MED ORDER — TRAZODONE HCL 150 MG PO TABS
150.0000 mg | ORAL_TABLET | Freq: Every evening | ORAL | Status: DC | PRN
  Administered 2014-08-05: 150 mg via ORAL
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
## 2014-08-05 NOTE — [facility] Suicide Risk Assessment (Signed)
[facility] INPATIENT:  Family/Significant Other Suicide Prevention Education  Suicide Prevention Education:  Education Completed; Donnie Coffin, husband,  (name of family member/significant other) has been identified by the patient as the family member/significant other with whom the patient will be residing, and identified as the person(s) who will aid the patient in the event of a mental health crisis (suicidal ideations/suicide attempt).  With written consent from the patient, the family member/significant other has been provided the following suicide prevention education, prior to the and/or following the discharge of the patient.  The suicide prevention education provided includes the following:  Suicide risk factors  Suicide prevention and interventions  National Suicide Hotline telephone number  [facility] assessment telephone number  [facility] Emergency Assistance [place] and/or Residential Mobile Crisis Unit telephone number  Request made of family/significant other to:  Remove weapons (e.g., guns, rifles, knives), all items previously/currently identified as safety concern.    Remove drugs/medications (over-the-counter, prescriptions, illicit drugs), all items previously/currently identified as a safety concern.  The family member/significant other verbalizes understanding of the suicide prevention education information provided.  The family member/significant other agrees to remove the items of safety concern listed above.  Husband thinks "she is not fully up" because she is not sleeping well and is still worrying about her mother in [place].  Worries that her stress level remains high.  Has had two other episodes requiring hospitalizations in [place], has also been hospitalized at [facility].  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  Stresses that "every time these events occur, its taking her longer to come back",  thinks "this time we caught it early."  Per husband, patient has "Biomedical engineer w $5000 deductible.  Per husband, patient is a "bright, loving, intelligent person" at baseline.  Feels "if she can get that sleep she will be much better."    Per husband, no firearms are in the home at present.  Patient has never talked about hurting herself or anyone else. Husband voiced understanding of suicide prevention education, pamphlet will be given to patient at discharge.   Beverely Pace 08/05/2014, 4:17 PM
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
## 2014-08-05 NOTE — [facility] Group Notes (Signed)
[place] LCSW Group Therapy  08/05/2014 , 3:35 PM   Type of Therapy:  Group Therapy  Participation Level:  Active  Participation Quality:  Attentive  Affect:  Appropriate  Cognitive:  Alert  Insight:  Improving  Engagement in Therapy:  Engaged  Modes of Intervention:  Discussion, Exploration and Socialization  Summary of Progress/Problems: Today's group focused on the term Diagnosis.  Participants were asked to define the term, and then pronounce whether it is a negative, positive or neutral term.  Stacie Daugherty attended briefly, left.  Returned with 15 minutes remaining and I ended the group 5 minutes early.  She dominated the conversation, shut down other patients by talking over them and was unresponsive to redirection.  Unable to read social cues and lectured about racism and her mother being held in a mental health prison.  [name] 08/05/2014 , 3:35 PM
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
## 2014-08-05 NOTE — [facility] Group Notes (Signed)
[facility] Group Notes:  (Nursing/MHT/Case Management/Adjunct)  Date:  08/05/2014  Time:  0945am  Type of Therapy:  Nurse Education  Participation Level:  Active  Participation Quality:  Intrusive  Affect:  Not Congruent  Cognitive:  Disorganized and Lacking  Insight:  Limited  Engagement in Group:  Engaged, Monopolizing and Poor  Modes of Intervention:  Discussion, Education and Support  Summary of Progress/Problems: Patient attended group, was intrusive with little response to redirection. Pt reports her goal for the day is "stay at peace." Pt laughs at inappropriate times. Pt makes frequent comments about police, law enforcement, racism, and religion.   Charlyne Quale A 08/05/2014, 10:47 AM

## 2014-08-05 NOTE — Progress Notes (Addendum)
Pt has been hyper-verbal this hour and continues to be intrusive to her peers, injecting herself into conversations involving their care and repeatedly trying to incite them to act out "and sue the shift out of this place." Pt does not respond at all to requests from the [facility], her RN and other Nursing Staff to focus her herself and not make inflammatory comments about other patients on the hallway. Pt also stands by the telephone in order to eavesdrop on other patient's phone calls where she also interjects comments, despite fellow Pt's repeated requests for her to stop listening to their conversations.  0140: Patient became increasingly bizarre, irritable, intrusive, demanding and argumentative. She was getting into other patient's business, instigating them to act in negative ways. She told one patient to call a number and report the facility, told another to sue the facility like she would do after discharge. Staff set limit with patient and told patient whatever goes on in the group stays in the group and also to focus on herself and on getting better. She is not suppose to be repeating whatever was discussed in the group outside of the group. She was restless, fixated on been pregnant and loosing the pregnancy during her arrest. Writer requested for another dosage of Trazodone for patient making it 300 mg with 100 mg of Seroquel she received at bedtime. Q 15 minute checks continues to maintain safety.

## 2014-08-05 NOTE — Plan of Care (Signed)
Problem: Ineffective individual coping Goal: STG: Patient will remain free from self harm Outcome: Progressing Patient remains free from self harm. 15 minute checks continued per protocol for patient safety.   Problem: Diagnosis: Increased Risk For Suicide Attempt Goal: STG-Patient Will Attend All Groups On The Unit Outcome: Progressing Patient is attending unit groups today. Goal: STG-Patient Will Comply With Medication Regime Outcome: Not Progressing Patient is willing to take some medications, with encouragement from RN and MD but has refused some medications today (See MAR).

## 2014-08-05 NOTE — Progress Notes (Signed)
The focus of this group is to help patients review their daily goal of treatment and discuss progress on daily workbooks. Pt attended the evening group session and responded to all discussion prompts, even those that were asked to other people. Pt spoke constantly throughout group and was not receptive to repeated attempts at redirection. Pt dispensed unsolicited medical advice, asked intrusive questions, interrupted her peers and spoke over them when she could not cut them off mid-sentence. Pt is racially and religiously preoccupied. She reported having had a good day, the highlight of which was her family visiting.
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
## 2014-08-05 NOTE — Progress Notes (Signed)
Patient's husband and son visited this evening. She seemed to have good visit with her family. She said she's not sure why they brought her from jail to the Hospital; "I was getting treatment from [facility], I guess you have better treatment here than [facility], they wouldn't have found out I had problem with my liver at [facility]". She id someone hyper verbal but have insights. She told her husband her Tegretol was discontinued and they started her on Lamictal. Husband said she brought in some paper work for her to feel so she could get paid at work. Patient denied SI/HI and denied Hallucinations. Writer encouraged and supported patient. Q 15 minute checks continue as ordered to maintain safety.

## 2014-08-06 DIAGNOSIS — R600 Localized edema: Secondary | ICD-10-CM | POA: Diagnosis present

## 2014-08-06 DIAGNOSIS — R7401 Elevation of levels of liver transaminase levels: Secondary | ICD-10-CM

## 2014-08-06 DIAGNOSIS — E871 Hypo-osmolality and hyponatremia: Secondary | ICD-10-CM

## 2014-08-06 DIAGNOSIS — F312 Bipolar disorder, current episode manic severe with psychotic features: Principal | ICD-10-CM

## 2014-08-06 DIAGNOSIS — R740 Nonspecific elevation of levels of transaminase and lactic acid dehydrogenase [LDH]: Secondary | ICD-10-CM

## 2014-08-06 LAB — URINALYSIS, ROUTINE W REFLEX MICROSCOPIC
Bilirubin Urine: NEGATIVE
Glucose, UA: NEGATIVE mg/dL
Hgb urine dipstick: NEGATIVE
Ketones, ur: NEGATIVE mg/dL
Leukocytes, UA: NEGATIVE
Nitrite: NEGATIVE
Protein, ur: NEGATIVE mg/dL
Specific Gravity, Urine: 1.021 (ref 1.005–1.030)
Urobilinogen, UA: 0.2 mg/dL (ref 0.0–1.0)
pH: 5.5 (ref 5.0–8.0)

## 2014-08-06 LAB — BASIC METABOLIC PANEL
Anion gap: 7 (ref 5–15)
BUN: 21 mg/dL (ref 6–23)
CO2: 29 mmol/L (ref 19–32)
Calcium: 9.2 mg/dL (ref 8.4–10.5)
Chloride: 96 mmol/L (ref 96–112)
Creatinine, Ser: 1 mg/dL (ref 0.50–1.10)
GFR calc Af Amer: 74 mL/min — ABNORMAL LOW (ref >90)
GFR calc non Af Amer: 64 mL/min — ABNORMAL LOW (ref >90)
Glucose, Bld: 98 mg/dL (ref 70–99)
Potassium: 4.3 mmol/L (ref 3.5–5.1)
Sodium: 132 mmol/L — ABNORMAL LOW (ref 135–145)

## 2014-08-06 LAB — HEMOGLOBIN AND HEMATOCRIT, BLOOD
HCT: 34.8 % — ABNORMAL LOW (ref 36.0–46.0)
Hemoglobin: 11.4 g/dL — ABNORMAL LOW (ref 12.0–15.0)

## 2014-08-06 LAB — TSH: TSH: 0.655 u[IU]/mL (ref 0.350–4.500)

## 2014-08-06 MED ORDER — GABAPENTIN 100 MG PO CAPS
200.0000 mg | ORAL_CAPSULE | Freq: Three times a day (TID) | ORAL | Status: DC
  Administered 2014-08-06 – 2014-08-07 (×3): 200 mg via ORAL
  Filled 2014-08-06 (×6): qty 2

## 2014-08-06 MED ORDER — QUETIAPINE FUMARATE 200 MG PO TABS
200.0000 mg | ORAL_TABLET | Freq: Every day | ORAL | Status: DC
  Administered 2014-08-06: 200 mg via ORAL
  Filled 2014-08-06 (×2): qty 1

## 2014-08-06 MED ORDER — NAPROXEN 375 MG PO TABS
375.0000 mg | ORAL_TABLET | Freq: Two times a day (BID) | ORAL | Status: DC | PRN
  Administered 2014-08-06 – 2014-08-08 (×4): 375 mg via ORAL
  Filled 2014-08-06 (×4): qty 1

## 2014-08-06 MED ORDER — QUETIAPINE FUMARATE 50 MG PO TABS
50.0000 mg | ORAL_TABLET | Freq: Every day | ORAL | Status: DC
  Administered 2014-08-06 – 2014-08-07 (×2): 50 mg via ORAL
  Filled 2014-08-06 (×6): qty 1

## 2014-08-06 MED ORDER — NAPROXEN 500 MG PO TABS
ORAL_TABLET | ORAL | Status: AC
  Filled 2014-08-06: qty 1

## 2014-08-06 MED ORDER — TRAZODONE HCL 100 MG PO TABS
100.0000 mg | ORAL_TABLET | Freq: Once | ORAL | Status: AC
  Administered 2014-08-06: 100 mg via ORAL
  Filled 2014-08-06: qty 1

## 2014-08-06 MED ORDER — TRAZODONE HCL 150 MG PO TABS
150.0000 mg | ORAL_TABLET | Freq: Every day | ORAL | Status: DC
  Administered 2014-08-06 – 2014-08-13 (×8): 150 mg via ORAL
  Filled 2014-08-06 (×10): qty 1

## 2014-08-06 MED ORDER — NAPROXEN 375 MG PO TABS
ORAL_TABLET | ORAL | Status: AC
  Filled 2014-08-06: qty 1
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
## 2014-08-06 NOTE — Consult Note (Signed)
Medical Consultation  Stacie Daugherty SWF:[id number] DOB: 1961/04/25 DOA: 08/04/2014 PCP: No primary care provider on file.   Requesting physician: Eappen Date of consultation: 08/06/14 Reason for consultation: leg edema and elevated LFTs  Impression/Recommendations Lower extremity edema -This appears to be chronic although it is difficult to ascertain from the patient's history and much worse it is beyond her usual baseline -The patient is able to ambulate without any assistive devices and without any pain -Urinalysis to check for proteinuria -Urine protein/creatinine ratio -Venous duplex rule out DVT -echo to r/o component of RV failure ??OSA -TSH--0.655 -albumin 4.1 -compression stockings to help with pain and discomfort Transaminasemia -new since 01/01/14 -May be medication effect from home meds of NSAIDS, tegretol, zyprexa, -present meds seroquel and lamictal may contribute -Hepatitis B surface antigen -Hepatitis C antibody -HIV antibody -Right upper quadrant ultrasound eval GB and hepatic architecture--?steatosis -repeat LFTs Hyponatremia -Likely due to HCTZ -Monitor Hx of positive ANA-->1:1280 -outpt follow up -no clinical evidence of vasculitis flare presently -ESR  I will followup again tomorrow. Please contact me if I can be of assistance in the meanwhile. Thank you for this consultation.  Chief Complaint: Lower extremity edema, elevated LFTs  HPI:  54 year old female with a history of bipolar disorder, hypertension, hyperlipidemia, positive ANA presented to behavioral health on 08/04/2014 when her family placed her under IVC secondary to bizarre behavior. The patient is currently being treated for psychosis. Medical consultation is obtained for lower extremity edema and elevated LFTs. The patient states that she was placed in shackles by place about one week ago. She is convinced that the shackles and forcing her to walk in the shackles have caused worsening of her  lower extremity edema. She states that she has had lower extremity edema for many years but it has worsened in the past week. She also complains of increasing pain in the bilateral lower extremities. She denies any fevers, chills, chest pain, shortness breath, nausea, vomiting, abdominal pain, diarrhea, dysuria, hematuria. She denies any tobacco, alcohol, illegal drug use. She has not had any orthopnea or PND type symptoms. She is convinced that the police "beat the hell out of me" which caused her leg edema. At home, the patient states that she takes ibuprofen for her leg swelling. In addition she is on Maxzide. Labs showed sodium 132, serum creatinine 1.00. AST 74, L267, phosphorus 43, total bilirubin 0.8. CBC was unremarkable. TSH was 0.655. Urine pregnancy test was negative. EKG was normal sinus rhythm without ST-T wave change.  Review of Systems:  Constitutional:  No weight loss, night sweats, Fevers, chills, fatigue.  Head&Eyes: No headache.  No vision loss.  No eye pain or scotoma ENT:  No Difficulty swallowing,Tooth/dental problems,Sore throat,   Cardio-vascular:  No chest pain, Orthopnea, PND,  dizziness, palpitations  GI:  No heartburn, indigestion, abdominal pain, nausea, vomiting, diarrhea, loss of appetite, hematochezia, melena Resp:  No shortness of breath with exertion or at rest. No excess mucus, no productive cough, No non-productive cough, No coughing up of blood.No change in color of mucus.No wheezing.No chest wall deformity  Skin:  no rash or lesions.  GU:  no dysuria, change in color of urine, no urgency or frequency. No flank pain.  Musculoskeletal:  No joint pain or swelling. No decreased range of motion. No back pain.  Psych:  No change in mood or affect.  Neurologic: No headache, no dysesthesia, no focal weakness, no vision loss. No syncope   Past Medical History  Diagnosis Date  .  Hypertension   . Bipolar 1 disorder     Hospitalized multiple times for bipolar  disorder ([facility] - [facility], [facility], and [facility], most recently in [place])  . Hidradenitis suppurativa    Past Surgical History  Procedure Laterality Date  . Induced abortion      in patient's 21s in [place], [place].   Social History:  reports that she has quit smoking. Her smoking use included Cigarettes. She does not have any smokeless tobacco history on file. She reports that she does not drink alcohol or use illicit drugs.  Family History  Problem Relation Age of Onset  . Depression Mother   . Pulmonary embolism Mother   . Bipolar disorder Mother   . Cancer Father     ?prostate cancer    Allergies  Allergen Reactions  . Haldol [Haloperidol Lactate] Other (See Comments)    Pt  Just doesn't like because she cant remember anything the next day.      Prior to Admission medications   Medication Sig Start Date End Date Taking? Authorizing Provider  acetaminophen (TYLENOL) 500 MG tablet Take 500 mg by mouth every 6 (six) hours as needed for moderate pain or headache.    Historical Provider, MD  carbamazepine (TEGRETOL) 200 MG tablet Take 500 mg by mouth at bedtime.     Historical Provider, MD  cetirizine (ZYRTEC) 10 MG tablet Take 1 tablet (10 mg total) by mouth daily. Patient not taking: Reported on 08/03/2014 01/01/14   [name], FNP  naproxen (NAPROSYN) 500 MG tablet Take 1 tablet (500 mg total) by mouth 2 (two) times daily with a meal. Patient taking differently: Take 500 mg by mouth 2 (two) times daily as needed for moderate pain.  05/06/13   Hilton Sinclair, MD  nystatin cream (MYCOSTATIN) Apply 1 application topically 2 (two) times daily. Patient not taking: Reported on 08/03/2014 01/01/14   [name], FNP  OLANZapine (ZYPREXA) 2.5 MG tablet Take 2.5 mg by mouth at bedtime.    Historical Provider, MD  sulfamethoxazole-trimethoprim (BACTRIM DS,SEPTRA DS) 800-160 MG per tablet Take 1 tablet by mouth 2 (two) times daily as needed  (swollen sweat glands).     Historical Provider, MD  traZODone (DESYREL) 150 MG tablet Take 150 mg by mouth at bedtime.    Historical Provider, MD  triamterene-hydrochlorothiazide (MAXZIDE-25) 37.5-25 MG per tablet Take 1 tablet by mouth daily. Follow up with PCP for labs. 01/01/14   [name], FNP    Physical Exam: Filed Vitals:   08/05/14 0933 08/05/14 1111 08/06/14 0600 08/06/14 0601  BP: 109/69 126/78 112/62 111/71  Pulse: 96 87 79 85  Temp:   98.3 F (36.8 C)   TempSrc:   Oral   Resp:   18   Height:      Weight:      SpO2:       General:  A&O x 3, NAD, nontoxic, pleasant/cooperative Head/Eye: No conjunctival hemorrhage, no icterus, [place]/AT, No nystagmus ENT:  No icterus,  No thrush, good dentition, no pharyngeal exudate Neck:  No masses, no lymphadenpathy, no bruits CV:  RRR, no rub, no gallop, no S3 Lung:  CTAB, good air movement, no wheeze, no rhonchi Abdomen: soft/NT, +BS, nondistended, no peritoneal signs Ext: No cyanosis, No rashes, No petechiae, No lymphangitis, No edema Neuro: CNII-XII intact, strength 4/5 in bilateral upper and lower extremities, no dysmetria  Labs on Admission:  Basic Metabolic Panel:  Recent Labs Lab 08/03/14 1229  08/06/14 0649  NA 137 132*  K 3.4* 4.3  CL 103 96  CO2 27 29  GLUCOSE 97 98  BUN 15 21  CREATININE 1.02 1.00  CALCIUM 9.0 9.2   Liver Function Tests:  Recent Labs Lab 08/03/14 1229  AST 74*  ALT 67*  ALKPHOS 43  BILITOT 0.8  PROT 7.8  ALBUMIN 4.1   No results for input(s): LIPASE, AMYLASE in the last 168 hours. No results for input(s): AMMONIA in the last 168 hours. CBC:  Recent Labs Lab 08/03/14 1229 08/06/14 0649  WBC 5.0  --   NEUTROABS 2.3  --   HGB 12.1 11.4*  HCT 35.9* 34.8*  MCV 93.2  --   PLT 259  --    Cardiac Enzymes: No results for input(s): CKTOTAL, CKMB, CKMBINDEX, TROPONINI in the last 168 hours. BNP: Invalid input(s): POCBNP CBG: No results for input(s): GLUCAP in the last 168  hours.  Radiological Exams on Admission: No results found.    Time spent: 60 min  Annette Liotta [facility] Pager [phone]  If 7PM-7AM, please contact night-coverage [email or web address] Password [facility] 08/06/2014, 4:26 PM

## 2014-08-06 NOTE — BHH Group Notes (Signed)
[facility] [employer] Group Therapy  08/06/2014 , 1:50 PM    Type of Therapy:  Mental Health Association Presentation  Participation Level:  Active  Participation Quality:  Attentive  Affect:  Blunted  Cognitive:  Oriented  Insight:  Limited  Engagement in Therapy:  Engaged  Modes of Intervention:  Discussion, Education and Socialization  Summary of Progress/Problems:  [name] from [place] came to present his recovery story and play the guitar.  Despite warnings from the beginning, Stacie Daugherty still interjected multiple times during the presentation.  She was redirectable, and others were tolerating her OK, so she was allowed to stay.  [name] 08/06/2014 , 1:50 PM

## 2014-08-06 NOTE — Progress Notes (Signed)
Patient remains manic, hyperverbal and intrusive. Frequently offering her unsolicited advice and opinion to peers. Remains delusional and continues to talk about how the police "killed her baby when they shackled her and carried her out of [place]." "Nothing is wrong with me. I've been taking medications for 30 years! My minister is coming to pick me up for Bible study." Patient can be irritable at times. Flirtatious with select males. Patient with minimal insight. Redirected frequently. Attempted to establish rapport with some success. Discussed care with MD and team.  Medicated per orders however with resistance. Patient had stated, "give me the highest dose so I can get out of here" however is then reluctant to take any meds. "Fine I will take them while I'm here but not after I leave." She denies SI/HI/AVH. Will continue to monitor closely. Jamie Kato

## 2014-08-06 NOTE — Progress Notes (Signed)
Patient was seen by [facility].  Spoke to hospitalist earlier this am for bilat pedal edema and increased LFT's.

## 2014-08-06 NOTE — Plan of Care (Signed)
Problem: Diagnosis: Increased Risk For Suicide Attempt Goal: STG-Patient Will Report Suicidal Feelings to Staff Outcome: Progressing Patient denies SI  Problem: Alteration in mood Goal: STG-Patient is able to sleep at least 6 hours per night Outcome: Not Progressing Patient did not sleep at all last night. She has not rested today even with staff encouragement.

## 2014-08-07 DIAGNOSIS — R600 Localized edema: Secondary | ICD-10-CM

## 2014-08-07 LAB — HIV ANTIBODY (ROUTINE TESTING W REFLEX): HIV Screen 4th Generation wRfx: NONREACTIVE

## 2014-08-07 LAB — HEPATIC FUNCTION PANEL
ALT: 38 U/L — ABNORMAL HIGH (ref 0–35)
AST: 27 U/L (ref 0–37)
Albumin: 3.5 g/dL (ref 3.5–5.2)
Alkaline Phosphatase: 45 U/L (ref 39–117)
Bilirubin, Direct: <0.1 mg/dL (ref 0.0–0.5)
Total Bilirubin: 0.7 mg/dL (ref 0.3–1.2)
Total Protein: 7 g/dL (ref 6.0–8.3)

## 2014-08-07 LAB — FERRITIN: Ferritin: 56 ng/mL (ref 10–291)

## 2014-08-07 LAB — VITAMIN B12: Vitamin B-12: 886 pg/mL (ref 211–911)

## 2014-08-07 LAB — FOLATE: Folate: 11.3 ng/mL

## 2014-08-07 LAB — HEPATITIS B SURFACE ANTIGEN: Hepatitis B Surface Ag: NEGATIVE

## 2014-08-07 LAB — SEDIMENTATION RATE: Sed Rate: 25 mm/hr — ABNORMAL HIGH (ref 0–22)

## 2014-08-07 LAB — PROTEIN / CREATININE RATIO, URINE
Creatinine, Urine: 123.95 mg/dL
Total Protein, Urine: <6 mg/dL

## 2014-08-07 LAB — IRON AND TIBC
Iron: 69 ug/dL (ref 42–145)
Saturation Ratios: 24 % (ref 20–55)
TIBC: 288 ug/dL (ref 250–470)
UIBC: 219 ug/dL (ref 125–400)

## 2014-08-07 LAB — HEPATITIS C ANTIBODY: HCV Ab: NEGATIVE

## 2014-08-07 MED ORDER — ZIPRASIDONE MESYLATE 20 MG IM SOLR
INTRAMUSCULAR | Status: AC
  Filled 2014-08-07: qty 20

## 2014-08-07 MED ORDER — LORAZEPAM 2 MG/ML IJ SOLN
1.0000 mg | Freq: Once | INTRAMUSCULAR | Status: AC
  Administered 2014-08-07: 1 mg via INTRAMUSCULAR

## 2014-08-07 MED ORDER — QUETIAPINE FUMARATE 300 MG PO TABS
300.0000 mg | ORAL_TABLET | Freq: Every day | ORAL | Status: DC
  Administered 2014-08-07: 300 mg via ORAL
  Filled 2014-08-07 (×2): qty 1

## 2014-08-07 MED ORDER — GABAPENTIN 300 MG PO CAPS
300.0000 mg | ORAL_CAPSULE | Freq: Three times a day (TID) | ORAL | Status: DC
  Administered 2014-08-07 – 2014-08-19 (×22): 300 mg via ORAL
  Filled 2014-08-07 (×32): qty 1
  Filled 2014-08-07 (×2): qty 12
  Filled 2014-08-07 (×7): qty 1
  Filled 2014-08-07: qty 12
  Filled 2014-08-07: qty 1

## 2014-08-07 MED ORDER — ZIPRASIDONE HCL 20 MG PO CAPS
20.0000 mg | ORAL_CAPSULE | Freq: Once | ORAL | Status: AC
  Filled 2014-08-07: qty 1

## 2014-08-07 MED ORDER — QUETIAPINE FUMARATE 400 MG PO TABS
400.0000 mg | ORAL_TABLET | Freq: Every day | ORAL | Status: DC
Start: 2014-08-07 — End: 2014-08-07

## 2014-08-07 MED ORDER — LORAZEPAM 2 MG/ML IJ SOLN
INTRAMUSCULAR | Status: AC
  Administered 2014-08-07: 17:00:00 via INTRAMUSCULAR
  Filled 2014-08-07: qty 1

## 2014-08-07 MED ORDER — LORAZEPAM 1 MG PO TABS
1.0000 mg | ORAL_TABLET | Freq: Three times a day (TID) | ORAL | Status: DC | PRN
  Administered 2014-08-08 – 2014-08-11 (×10): 1 mg via ORAL
  Filled 2014-08-07 (×11): qty 1

## 2014-08-07 MED ORDER — ZIPRASIDONE MESYLATE 20 MG IM SOLR
20.0000 mg | Freq: Once | INTRAMUSCULAR | Status: AC
  Administered 2014-08-07: 20 mg via INTRAMUSCULAR
  Filled 2014-08-07: qty 20

## 2014-08-07 MED ORDER — QUETIAPINE FUMARATE 50 MG PO TABS
50.0000 mg | ORAL_TABLET | Freq: Every day | ORAL | Status: DC
  Administered 2014-08-07: 50 mg via ORAL
  Filled 2014-08-07 (×2): qty 1

## 2014-08-07 NOTE — Plan of Care (Signed)
Problem: Diagnosis: Increased Risk For Suicide Attempt Goal: STG-Patient Will Comply With Medication Regime Outcome: Progressing Pt has been compliant with medications this shift.

## 2014-08-07 NOTE — BHH Group Notes (Signed)
[place] [facility] LCSW Group Therapy  08/07/2014 4:17 PM   Type of Therapy:  Group Therapy  Participation Level:  Did not attend.  Was on medical appointment  Summary of Progress/Problems: Today's group focused on relapse prevention.  We defined the term, and then brainstormed on ways to prevent relapse.  [name] 08/07/2014 , 4:17 PM

## 2014-08-07 NOTE — Tx Team (Signed)
  Interdisciplinary Treatment Plan Update   Date Reviewed:  08/07/2014  Time Reviewed:  10:47 AM  Progress in Treatment:   Attending groups: Yes Participating in groups: Rather disruptive Taking medication as prescribed: Yes  Tolerating medication: Yes Family/Significant other contact made: Yes  Patient understands diagnosis: Yes  Discussing patient identified problems/goals with staff: Yes  See initial care plan Medical problems stabilized or resolved: Yes Denies suicidal/homicidal ideation: Yes  In tx team Patient has not harmed self or others: Yes  For review of initial/current patient goals, please see plan of care.  Estimated Length of Stay:  4-5 days  Reason for Continuation of Hospitalization: Mania Medication stabilization  New Problems/Goals identified:  N/A  Discharge Plan or Barriers:   return home, follow up outpt  Additional Comments:  Pt continues intrusive, loud, pressured, focused on mother in [facility].  Dr is slowly raising med dosages-Seroquel and Lamictal.  Pt slept 6 hours last night.  Attendees:  Signature: Steva Colder, MD 08/07/2014 10:47 AM   Signature: Ripley Fraise, LCSW 08/07/2014 10:47 AM  Signature: Edwyna Shell, LCSW 08/07/2014 10:47 AM  Signature: Janann August, RN 08/07/2014 10:47 AM  Signature:  08/07/2014 10:47 AM  Signature:  08/07/2014 10:47 AM  Signature:   08/07/2014 10:47 AM  Signature:    Signature:    Signature:    Signature:    Signature:    Signature:      Scribe for Treatment Team:   Ripley Fraise, LCSW  08/07/2014 10:47 AM

## 2014-08-07 NOTE — Plan of Care (Signed)
Problem: Alteration in mood Goal: STG-Patient does not harm self or others Outcome: Progressing Pt maintained on Q 15 minutes checks for safety and is monitored as per order without gestures or event of self injurious behavior to note at this time.

## 2014-08-07 NOTE — Progress Notes (Signed)
D: Pt awake at present pacing in hall and being verbally abusive and threatening towards others. Pt ate lunch and dinner on unit due to her volatile / labile behavior.   A: 1:1 contact made with pt to conduct shift assessment. Emotional support and availability offered. Appointment obtained for pt this shift at [facility] radiology department for 2D echocardiogram  venous duplex on bilateral lower extremities for pain and edema. Q 15 minutes checks maintained as ordered. Continued verbal redirection provided to pt intermittently throughout this shift to help pt calm down due to labile mood. All medications given as per order including PRN Naproxen for pain and one time order of Geodon 20 mg IM and Ativan 1 mg IM due to pt's aggressive behavior and threats towards others.   R: Pt presents with angry affect and labile mood. Pt has been very intrusive and verbally abusive towards peers and staff. Observed in a verbal altercation with a female peer in the dayroom post nursing group this AM, staff intervened to stop the behavior. Pt has been tangential, delusional and preoccupied with sexual and racial content, called caucasian staff "White devils" and "you African bitches, go to [place], you sold [place] into slavery. Pt took her medications PO with increased prompts and refused her 1400 Neurontin. Pt did go to [facility] radiology for her appointment accompanied by staff. She became angry and abusive upon return from [facility]. Verbal redirection was ineffective at the time. IM meds given (see MAR). Safety maintained.

## 2014-08-07 NOTE — Progress Notes (Signed)
*  Preliminary Results* Bilateral lower extremity venous duplex completed. Bilateral lower extremities are negative for deep vein thrombosis. There is no evidence of Baker's cyst bilaterally.  08/07/2014  Maudry Mayhew, RVT, RDCS, RDMS
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
## 2014-08-07 NOTE — Progress Notes (Signed)
Adult Psychoeducational Group Note  Date:  08/07/2014 Time: 09:15  Group Topic/Focus:  Orientation:   The focus of this group is to educate the patient on the purpose and policies of crisis stabilization and provide a format to answer questions about their admission.  The group details unit policies and expectations of patients while admitted.  Participation Level:  Active  Participation Quality:  Intrusive, Inattentive and Monopolizing  Affect:  Blunted and Labile and Incongruent  Cognitive:  Alert  Insight: Limited  Engagement in Group:  Defensive, Distracting, Monopolizing, Off Topic and Resistant  Modes of Intervention:  Clarification, Discussion, Education and Support  Additional Comments:  Pt active in group. Pt monopolizes conversation and often belittles other pts answers. Pt speech is rapid, loud and at times vulgar in content.   Elenore Rota 08/07/2014, 12:48 PM

## 2014-08-07 NOTE — Plan of Care (Signed)
Problem: Alteration in mood Goal: STG-Patient is able to sleep at least 6 hours per night Outcome: Progressing Pt has slept over 6 hours this shift.

## 2014-08-07 NOTE — Progress Notes (Signed)
  Echocardiogram 2D Echocardiogram has been performed.  Stacie Daugherty 08/07/2014, 2:40 PM

## 2014-08-07 NOTE — Progress Notes (Signed)
[facility]                                                                              Patient Demographics  Stacie Daugherty, is a 54 y.o. female, DOB - 04-28-1961, ZJI:[id number]  Admit date - 08/04/2014   Admitting Physician Ursula Alert, MD  Outpatient Primary MD for the patient is No primary care provider on file.  LOS - 3   No chief complaint on file.      Assessment & Plan   Lower extremity edema -Possibly chronic -Per consult note, patient ambulatory -pending Echocardiogram and lower extremity doppler -Urine negative for protienuria -TSH 0.655, Albumin 4.1 -Consider compression stockings to help with pain and discomfort  Transaminasemia -new since 01/01/14 -May be dehydration vs medication effect from home meds of NSAIDS, tegretol, zyprexa, -present meds seroquel and lamictal may contribute -Improving, LFTs trending downward -Hepatitis B surface antigen and hep C antibody pending -HIV antibody -[place] RUQ pending  Hyponatremia -Likely due to HCTZ -Monitor  Hx of positive ANA-->1:1280 -outpt follow up -no clinical evidence of vasculitis flare presently -ESR mildly elevated at 25 (normal <22)  Code Status: Full  Family Communication: None at bedside  Disposition Plan: Admitted to [facility]. Please note, patient not seen today.  Chart Reviewed.  Still pending workup- RUQ [place], and labs.  Will continue to follow with you.  Time Spent in minutes   20 minutes  Procedures  None  Consults   TRH   Lab Results  Component Value Date   PLT 259 08/03/2014    Medications  Scheduled Meds: . gabapentin  200 mg Oral TID  . lamoTRIgine  25 mg Oral Daily  . [START ON 08/12/2014] lamoTRIgine  50 mg Oral Daily  . loratadine  10 mg Oral Daily  . QUEtiapine  400 mg Oral QHS  . QUEtiapine  50 mg Oral Daily  . traZODone  150 mg Oral QHS  . triamterene-hydrochlorothiazide  1 tablet Oral Daily   Continuous Infusions:  PRN Meds:.acetaminophen, alum & mag  hydroxide-simeth, magnesium hydroxide, naproxen  Antibiotics    Anti-infectives    None      Wood Novacek D.O. on 08/07/2014 at 12:01 PM  Between 7am to 7pm - Pager - [phone]  After 7pm go to [email or web address] - password [email or web address]  And look for the night coverage person covering for me after hours  [facility] Office  [phone]

## 2014-08-08 ENCOUNTER — Ambulatory Visit (HOSPITAL_COMMUNITY)
Admit: 2014-08-08 | Discharge: 2014-08-08 | Disposition: A | Discharge status: Home / Self Care | Payer: No Typology Code available for payment source | Attending: Internal Medicine | Admitting: Internal Medicine

## 2014-08-08 DIAGNOSIS — F3111 Bipolar disorder, current episode manic without psychotic features, mild: Secondary | ICD-10-CM

## 2014-08-08 DIAGNOSIS — I10 Essential (primary) hypertension: Secondary | ICD-10-CM

## 2014-08-08 DIAGNOSIS — K8020 Calculus of gallbladder without cholecystitis without obstruction: Secondary | ICD-10-CM | POA: Insufficient documentation

## 2014-08-08 LAB — CBC
HCT: 34.7 % — ABNORMAL LOW (ref 36.0–46.0)
Hemoglobin: 11.4 g/dL — ABNORMAL LOW (ref 12.0–15.0)
MCH: 31.8 pg (ref 26.0–34.0)
MCHC: 32.9 g/dL (ref 30.0–36.0)
MCV: 96.9 fL (ref 78.0–100.0)
Platelets: 282 10*3/uL (ref 150–400)
RBC: 3.58 MIL/uL — ABNORMAL LOW (ref 3.87–5.11)
RDW: 13 % (ref 11.5–15.5)
WBC: 4.8 10*3/uL (ref 4.0–10.5)

## 2014-08-08 LAB — COMPREHENSIVE METABOLIC PANEL
ALT: 34 U/L (ref 0–35)
AST: 29 U/L (ref 0–37)
Albumin: 3.6 g/dL (ref 3.5–5.2)
Alkaline Phosphatase: 52 U/L (ref 39–117)
Anion gap: 4 — ABNORMAL LOW (ref 5–15)
BUN: 16 mg/dL (ref 6–23)
CO2: 29 mmol/L (ref 19–32)
Calcium: 8.8 mg/dL (ref 8.4–10.5)
Chloride: 104 mmol/L (ref 96–112)
Creatinine, Ser: 0.94 mg/dL (ref 0.50–1.10)
GFR calc Af Amer: 79 mL/min — ABNORMAL LOW (ref >90)
GFR calc non Af Amer: 69 mL/min — ABNORMAL LOW (ref >90)
Glucose, Bld: 90 mg/dL (ref 70–99)
Potassium: 4.4 mmol/L (ref 3.5–5.1)
Sodium: 137 mmol/L (ref 135–145)
Total Bilirubin: 0.3 mg/dL (ref 0.3–1.2)
Total Protein: 7.2 g/dL (ref 6.0–8.3)

## 2014-08-08 MED ORDER — ZIPRASIDONE MESYLATE 20 MG IM SOLR
10.0000 mg | Freq: Two times a day (BID) | INTRAMUSCULAR | Status: DC
  Filled 2014-08-08 (×6): qty 20

## 2014-08-08 MED ORDER — ZIPRASIDONE MESYLATE 20 MG IM SOLR
10.0000 mg | Freq: Every day | INTRAMUSCULAR | Status: DC
  Filled 2014-08-08 (×2): qty 20

## 2014-08-08 MED ORDER — QUETIAPINE FUMARATE 100 MG PO TABS
100.0000 mg | ORAL_TABLET | Freq: Every day | ORAL | Status: DC
Start: 2014-08-08 — End: 2014-08-08
  Filled 2014-08-08 (×2): qty 1

## 2014-08-08 MED ORDER — CARVEDILOL 3.125 MG PO TABS
3.1250 mg | ORAL_TABLET | Freq: Two times a day (BID) | ORAL | Status: DC
  Administered 2014-08-08 – 2014-08-18 (×21): 3.125 mg via ORAL
  Filled 2014-08-08 (×25): qty 1

## 2014-08-08 MED ORDER — ZIPRASIDONE MESYLATE 20 MG IM SOLR
20.0000 mg | Freq: Three times a day (TID) | INTRAMUSCULAR | Status: DC | PRN
  Administered 2014-08-07 (×2): via INTRAMUSCULAR

## 2014-08-08 MED ORDER — DIPHENHYDRAMINE HCL 50 MG/ML IJ SOLN
25.0000 mg | Freq: Three times a day (TID) | INTRAMUSCULAR | Status: DC | PRN
  Filled 2014-08-08: qty 1

## 2014-08-08 MED ORDER — DIPHENHYDRAMINE HCL 50 MG/ML IJ SOLN: 25.0000 mg | Freq: Three times a day (TID) | INTRAMUSCULAR | Status: DC | PRN

## 2014-08-08 MED ORDER — ZIPRASIDONE HCL 20 MG PO CAPS: 20.0000 mg | ORAL_CAPSULE | Freq: Three times a day (TID) | ORAL | Status: DC | PRN

## 2014-08-08 MED ORDER — DIPHENHYDRAMINE HCL 25 MG PO CAPS
25.0000 mg | ORAL_CAPSULE | Freq: Three times a day (TID) | ORAL | Status: DC | PRN
  Administered 2014-08-12: 25 mg via ORAL
  Filled 2014-08-08: qty 1

## 2014-08-08 MED ORDER — ZIPRASIDONE MESYLATE 20 MG IM SOLR: 20.0000 mg | Freq: Three times a day (TID) | INTRAMUSCULAR | Status: DC | PRN

## 2014-08-08 MED ORDER — LORAZEPAM 2 MG/ML IJ SOLN: 2.0000 mg | Freq: Once | INTRAMUSCULAR | Status: DC

## 2014-08-08 MED ORDER — QUETIAPINE FUMARATE 400 MG PO TABS
400.0000 mg | ORAL_TABLET | Freq: Every day | ORAL | Status: DC
  Filled 2014-08-08 (×2): qty 1

## 2014-08-08 MED ORDER — DIPHENHYDRAMINE HCL 50 MG/ML IJ SOLN
INTRAMUSCULAR | Status: AC
  Filled 2014-08-08: qty 1

## 2014-08-08 MED ORDER — FUROSEMIDE 20 MG PO TABS
20.0000 mg | ORAL_TABLET | Freq: Every day | ORAL | Status: DC
  Administered 2014-08-08 – 2014-08-15 (×8): 20 mg via ORAL
  Filled 2014-08-08 (×11): qty 1

## 2014-08-08 MED ORDER — LORAZEPAM 2 MG/ML IJ SOLN
INTRAMUSCULAR | Status: AC
  Filled 2014-08-08: qty 1

## 2014-08-08 MED ORDER — ZIPRASIDONE MESYLATE 20 MG IM SOLR
INTRAMUSCULAR | Status: AC
  Filled 2014-08-08: qty 20

## 2014-08-08 MED ORDER — ZIPRASIDONE HCL 20 MG PO CAPS
20.0000 mg | ORAL_CAPSULE | Freq: Two times a day (BID) | ORAL | Status: DC
  Administered 2014-08-08: 20 mg via ORAL
  Filled 2014-08-08 (×8): qty 1
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
## 2014-08-08 NOTE — BHH Group Notes (Signed)
[facility] LCSW Group Therapy  08/08/2014  1:05 PM  Type of Therapy:  Group therapy  Participation Level:  Active  Participation Quality:  Attentive  Affect:  Flat  Cognitive:  Oriented  Insight:  Limited  Engagement in Therapy:  Limited  Modes of Intervention:  Discussion, Socialization  Summary of Progress/Problems:  Chaplain was here to lead a group on themes of hope and courage.Slept soundly for the first half of group.  Left.  [name] 08/08/2014 4:08 PM

## 2014-08-08 NOTE — Progress Notes (Signed)
D. Pt had been awake until approximately 0130 this morning. Pt had been singing in hallways, laughing inappropriately, using inappropriate language with staff and fellow peers and has been upset that she feels we are doing nothing for her edema in her feet. Pt has also been intrusive this evening and needed re-direction about boundaries. Pt did receive medications this evening without incident. A. Pt educated in regards to edema, re-direction and support provided throughout the evening. R. Pt verbalized understanding, safety maintained, will continue to monitor.

## 2014-08-08 NOTE — BHH Group Notes (Signed)
[facility] LCSW Aftercare Discharge Planning Group Note   08/08/2014 11:06 AM  Participation Quality:  Invited.  Chose to not attend    [place], Barbaraann Rondo B
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
## 2014-08-08 NOTE — Progress Notes (Signed)
[facility]                                                                              Patient Demographics  Stacie Daugherty, is a 54 y.o. female, DOB - May 15, 1961, AYT:[id number]  Admit date - 08/04/2014   Admitting Physician Ursula Alert, MD  Outpatient Primary MD for the patient is No primary care provider on file.  LOS - 4   No chief complaint on file.       Assessment & Plan   Lower extremity edema -Possibly chronic -Echocardiogram obtained showing an EF of 65-70%; dynamic obstruction in the outflow tract, mild concentric hypertrophy -Spoke with Cardiology regarding echocardiogram results, no need for followup.   -Lower extremity Doppler obtained was negative for DVT or Baker's cyst -Urine negative for protienuria -TSH 0.655, Albumin 4.1 -Consider compression stockings to help with pain and discomfort and keeping legs elevated -Will start patient on low dose coreg and lasix  Transaminasemia -new since 01/01/14 -May be dehydration vs medication effect from home meds of NSAIDS, tegretol, zyprexa, -present meds seroquel and lamictal may contribute -Improving, LFTs trending downward- however pending repeat LFTs -Hepatitis B surface antigen negative, hepatitis C antibody negative, HIV nonreactive -RUQ ultrasound: 2 cm gallstone versus tumefactive sludge, no sonographic signs of acute cholecystitis -Spoke with surgery, patient should follow up with [facility] upon discharge  Hyponatremia -Likely due to HCTZ -Pending labs today  Hx of positive ANA-->1:1280 -outpt follow up -no clinical evidence of vasculitis flare presently -ESR mildly elevated at 25 (normal <22)  Code Status: Full  Family Communication: None at bedside  Disposition Plan: Admitted to [facility]. Will follow patient with you.  Time Spent in minutes 30 minutes  Procedures  Echocardiogram Lower extremity Doppler  Consults  [facility] Cardiology, Dr. Aundra Dubin, via phone  Lab Results   Component Value Date   PLT 259 08/03/2014    Medications  Scheduled Meds: . gabapentin  300 mg Oral TID  . lamoTRIgine  25 mg Oral Daily  . [START ON 08/12/2014] lamoTRIgine  50 mg Oral Daily  . loratadine  10 mg Oral Daily  . LORazepam  2 mg Intramuscular Once  . QUEtiapine  100 mg Oral QPC breakfast   Or  . ziprasidone  10 mg Intramuscular QPC breakfast  . QUEtiapine  400 mg Oral QHS   Or  . ziprasidone  10 mg Intramuscular QHS  . traZODone  150 mg Oral QHS  . triamterene-hydrochlorothiazide  1 tablet Oral Daily   Continuous Infusions:  PRN Meds:.acetaminophen, alum & mag hydroxide-simeth, ziprasidone **AND** diphenhydrAMINE, ziprasidone **AND** diphenhydrAMINE, LORazepam, magnesium hydroxide, naproxen  Antibiotics    Anti-infectives    None        Subjective:   Jamison Neighbor seen and examined today.  Patient complains of lower extremity swelling.  States that she was arrested and placed in shackles, and ran from the police.   Patient admits to having a history of swelling in her ankles.  She denies dizziness, shortness of breath, chest pain, abdominal pain.  Objective:   Filed Vitals:   08/06/14 0601 08/07/14 0624 08/07/14 0625 08/07/14 1141  BP: 111/71 130/93 121/105 127/78  Pulse: 85 94 97  87  Temp:  98.5 F (36.9 C)    TempSrc:  Oral    Resp:  20    Height:      Weight:      SpO2:        Wt Readings from Last 3 Encounters:  08/04/14 101.152 kg (223 lb)  01/01/14 103.42 kg (228 lb)  05/06/13 97.523 kg (215 lb)    No intake or output data in the 24 hours ending 08/08/14 1401  Exam  General: Well developed, well nourished  HEENT: NCAT, mucous membranes moist.   Cardiovascular: S1 S2 auscultated, no rubs, murmurs or gallops. Regular rate and rhythm.  Respiratory: Clear to auscultation  Abdomen: Soft, nontender, nondistended, + bowel sounds  Extremities: warm dry without cyanosis clubbing.  No pitting edema in LE.   Neuro: AAOx3,  nonfocal  Data Review   Micro Results No results found for this or any previous visit (from the past 240 hour(s)).  Radiology Reports US Abdomen Limited Ruq  08/08/2014   CLINICAL DATA:  Initial encounter for elevated liver function tests.  EXAM: US ABDOMEN LIMITED - RIGHT UPPER QUADRANT  COMPARISON:  None.  FINDINGS: Gallbladder:  2.0 cm echogenic structure in the lumen of the gallbladder shows some posterior shadowing in is apparently non mobile. This could be a gallstone or tumefactive sludge. There is no associated gallbladder wall thickening or pericholecystic fluid. No sonographic Murphy's on.  Common bile duct:  Diameter: 4 mm  Liver:  The liver shows diffusely coarsened echotexture with decreased through-transmission, features consistent with steatosis.  IMPRESSION: 2 cm gallstone versus tumefactive sludge. No sonographic signs of acute cholecystitis.   Electronically Signed   By: Misty Stanley M.D.   On: 08/08/2014 09:57    CBC  Recent Labs Lab 08/03/14 1229 08/06/14 0649  WBC 5.0  --   HGB 12.1 11.4*  HCT 35.9* 34.8*  PLT 259  --   MCV 93.2  --   MCH 31.4  --   MCHC 33.7  --   RDW 12.8  --   LYMPHSABS 1.9  --   MONOABS 0.6  --   EOSABS 0.1  --   BASOSABS 0.1  --     Chemistries   Recent Labs Lab 08/03/14 1229 08/06/14 0649 08/07/14 0638  NA 137 132*  --   K 3.4* 4.3  --   CL 103 96  --   CO2 27 29  --   GLUCOSE 97 98  --   BUN 15 21  --   CREATININE 1.02 1.00  --   CALCIUM 9.0 9.2  --   AST 74*  --  27  ALT 67*  --  38*  ALKPHOS 43  --  45  BILITOT 0.8  --  0.7   ------------------------------------------------------------------------------------------------------------------ estimated creatinine clearance is 74.7 mL/min (by C-G formula based on Cr of 1). ------------------------------------------------------------------------------------------------------------------ No results for input(s): HGBA1C in the last 72  hours. ------------------------------------------------------------------------------------------------------------------ No results for input(s): CHOL, HDL, LDLCALC, TRIG, CHOLHDL, LDLDIRECT in the last 72 hours. ------------------------------------------------------------------------------------------------------------------  Recent Labs  08/06/14 0649  TSH 0.655   ------------------------------------------------------------------------------------------------------------------  Recent Labs  08/07/14 0638  VITAMINB12 886  FOLATE 11.3  FERRITIN 56  TIBC 288  IRON 69    Coagulation profile No results for input(s): INR, PROTIME in the last 168 hours.  No results for input(s): DDIMER in the last 72 hours.  Cardiac Enzymes No results for input(s): CKMB, TROPONINI, MYOGLOBIN in the last 168 hours.  Invalid input(s):  CK ------------------------------------------------------------------------------------------------------------------ Invalid input(s): POCBNP    Raylan Troiani D.O. on 08/08/2014 at 2:01 PM  Between 7am to 7pm - Pager - [phone]  After 7pm go to [email or web address] - password [email or web address]  And look for the night coverage person covering for me after hours  [facility] Office  [phone]

## 2014-08-09 MED ORDER — NAPROXEN 375 MG PO TABS
375.0000 mg | ORAL_TABLET | Freq: Two times a day (BID) | ORAL | Status: DC | PRN
  Administered 2014-08-09 – 2014-08-19 (×14): 375 mg via ORAL
  Filled 2014-08-09 (×14): qty 1

## 2014-08-09 MED ORDER — QUETIAPINE FUMARATE 200 MG PO TABS
200.0000 mg | ORAL_TABLET | Freq: Two times a day (BID) | ORAL | Status: DC
  Administered 2014-08-09 – 2014-08-10 (×2): 200 mg via ORAL
  Filled 2014-08-09 (×8): qty 1
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
## 2014-08-09 NOTE — Progress Notes (Signed)
   [facility]                                                                              Patient Demographics  Stacie Daugherty, is a 54 y.o. female, DOB - 12-Feb-1961, PVX:[id number]  Admit date - 08/04/2014   Admitting Physician Ursula Alert, MD  Outpatient Primary MD for the patient is No primary care provider on file.  LOS - 5   No chief complaint on file.       Assessment & Plan   Lower extremity edema -Possibly chronic -Echocardiogram obtained showing an EF of 65-70%; dynamic obstruction in the outflow tract, mild concentric hypertrophy -Spoke with Cardiology regarding echocardiogram results, no need for followup.   -Lower extremity Doppler obtained was negative for DVT or Baker's cyst -Urine negative for protienuria -TSH 0.655, Albumin 4.1 -Consider compression stockings to help with pain and discomfort and keeping legs elevated -Continue on low dose coreg and lasix (triamterene/HCTZ discontinued)  Transaminasemia -Resolved -May be dehydration vs medication effect from home meds of NSAIDS, tegretol, zyprexa, -present meds seroquel and lamictal may contribute -Hepatitis B surface antigen negative, hepatitis C antibody negative, HIV nonreactive -RUQ ultrasound: 2 cm gallstone versus tumefactive sludge, no sonographic signs of acute cholecystitis -Spoke with surgery, patient should follow up with [facility] upon discharge  Hyponatremia -Likely due to HCTZ -Resolved  Hx of positive ANA-->1:1280 -outpt follow up -no clinical evidence of vasculitis flare presently -ESR mildly elevated at 25 (normal <22)  Code Status: Full  Family Communication: None at bedside  Disposition Plan: Admitted to [facility].  Please note, patient not seen today. Chart Reviewed. Work-up for LE edema negative thus far.  Likely chronic.  Switched patient lasix.  Will continue to monitor her labs and will follow up with patient on 08/10/2014. Will continue to follow with you.      Time Spent in minutes 15 minutes  Procedures  Echocardiogram Lower extremity Doppler  Consults  [facility], Dr. Aundra Dubin, via phone   Tayte Childers, [facility] D.O. on 08/09/2014 at 11:51 AM  Between 7am to 7pm - Pager - [phone]  After 7pm go to [email or web address] - password [email or web address]  And look for the night coverage person covering for me after hours  [facility] Office  [phone]

## 2014-08-09 NOTE — [facility] Group Notes (Signed)
[place] Group Notes:  Coping skills  Date:  08/09/2014  Time:  10:59 AM  Type of Therapy:  Nurse Education  Participation Level:  Active  Participation Quality:  Appropriate  Affect:  Appropriate  Cognitive:  Appropriate  Insight:  Appropriate  Engagement in Group:  Engaged  Modes of Intervention:  Discussion  Summary of Progress/Problems:  Delman Kitten 08/09/2014, 10:59 AM

## 2014-08-09 NOTE — Progress Notes (Signed)
Did not attend group 
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
## 2014-08-09 NOTE — [facility] Group Notes (Signed)
[facility] Group Notes:  (Clinical Social Work)  08/09/2014  11:15-12:00PM  Summary of Progress/Problems:   The main focus of today's process group was to discuss patients' feelings about hospitalization, the stigma attached to mental health, and sources of motivation to stay well.  We then worked to identify a specific plan to avoid future hospitalizations when discharged from the hospital for this admission.  The patient expressed that she does not feel she needs to be in the hospital at all.  She states as a result she feels angry and embarrassed.  She feels the only benefit to her of being in the hospital is to change her Tegretol medication, because she has been on it for over 20 years, needs her body tested to see how much, if any, impact this has had on her health.  Type of Therapy:  Group Therapy - Process  Participation Level:  Active  Participation Quality:  Resistant  Affect:  Defensive  Cognitive:  Appropriate  Insight:  Developing/Improving  Engagement in Therapy:  Lacking  Modes of Intervention:  Exploration, Discussion  Selmer Dominion, LCSW 08/09/2014, 1:05 PM

## 2014-08-09 NOTE — Progress Notes (Signed)
D: Pt has anxious affect and mood.  Pt describes her day as "not good."  Pt reports she "took a nap earlier from the forced meds."  Pt reports she is "just ready to go home."  Pt denies SI/HI, denies hallucinations, denies pain.  She was visible in the milieu this evening.  She remains intrusive with staff and peers at times.   A: Medications administered per order.  PRN medication administered for anxiety, see flowsheet.  Safety maintained.  Met with pt 1:1 and provided support and encouragement.  After taking scheduled Neurontin, pt reported she does not want to take Neurontin anymore.  Pt stated "I like Seroquel okay, Neurontin brings me too low."  Pt encouraged to elevate feet.  Pt provided with ice pack for bipedal edema.   R: Pt was compliant with medications.  She verbally contracted for safety. Will continue to monitor and assess for safety.

## 2014-08-10 DIAGNOSIS — E780 Pure hypercholesterolemia: Secondary | ICD-10-CM

## 2014-08-10 LAB — CBC
HCT: 35.4 % — ABNORMAL LOW (ref 36.0–46.0)
Hemoglobin: 11.3 g/dL — ABNORMAL LOW (ref 12.0–15.0)
MCH: 30.6 pg (ref 26.0–34.0)
MCHC: 31.9 g/dL (ref 30.0–36.0)
MCV: 95.9 fL (ref 78.0–100.0)
Platelets: 293 10*3/uL (ref 150–400)
RBC: 3.69 MIL/uL — ABNORMAL LOW (ref 3.87–5.11)
RDW: 13 % (ref 11.5–15.5)
WBC: 4.1 10*3/uL (ref 4.0–10.5)

## 2014-08-10 LAB — COMPREHENSIVE METABOLIC PANEL
ALT: 32 U/L (ref 0–35)
AST: 25 U/L (ref 0–37)
Albumin: 3.6 g/dL (ref 3.5–5.2)
Alkaline Phosphatase: 43 U/L (ref 39–117)
Anion gap: 5 (ref 5–15)
BUN: 16 mg/dL (ref 6–23)
CO2: 29 mmol/L (ref 19–32)
Calcium: 8.7 mg/dL (ref 8.4–10.5)
Chloride: 103 mmol/L (ref 96–112)
Creatinine, Ser: 0.77 mg/dL (ref 0.50–1.10)
GFR calc Af Amer: >90 mL/min (ref >90)
GFR calc non Af Amer: >90 mL/min (ref >90)
Glucose, Bld: 90 mg/dL (ref 70–99)
Potassium: 3.9 mmol/L (ref 3.5–5.1)
Sodium: 137 mmol/L (ref 135–145)
Total Bilirubin: 0.4 mg/dL (ref 0.3–1.2)
Total Protein: 6.7 g/dL (ref 6.0–8.3)

## 2014-08-10 MED ORDER — QUETIAPINE FUMARATE 200 MG PO TABS
200.0000 mg | ORAL_TABLET | Freq: Three times a day (TID) | ORAL | Status: DC
  Administered 2014-08-10 – 2014-08-11 (×3): 200 mg via ORAL
  Filled 2014-08-10 (×8): qty 1

## 2014-08-10 MED ORDER — LAMOTRIGINE 25 MG PO TABS
25.0000 mg | ORAL_TABLET | Freq: Once | ORAL | Status: AC
  Administered 2014-08-10: 25 mg via ORAL
  Filled 2014-08-10 (×2): qty 1

## 2014-08-10 MED ORDER — LAMOTRIGINE 25 MG PO TABS
75.0000 mg | ORAL_TABLET | Freq: Every day | ORAL | Status: AC
  Administered 2014-08-12 – 2014-08-18 (×7): 75 mg via ORAL
  Filled 2014-08-10 (×7): qty 3

## 2014-08-10 MED ORDER — LAMOTRIGINE 25 MG PO TABS
50.0000 mg | ORAL_TABLET | Freq: Every day | ORAL | Status: DC
  Administered 2014-08-11: 50 mg via ORAL
  Filled 2014-08-10 (×3): qty 2

## 2014-08-10 MED ORDER — QUETIAPINE FUMARATE 200 MG PO TABS
200.0000 mg | ORAL_TABLET | Freq: Once | ORAL | Status: AC
  Administered 2014-08-10: 200 mg via ORAL
  Filled 2014-08-10 (×2): qty 1
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
## 2014-08-10 NOTE — Progress Notes (Signed)
[facility]                                                                              Patient Demographics  Stacie Daugherty, is a 54 y.o. female, DOB - 1961/05/06, FJL:[id number]  Admit date - 08/04/2014   Admitting Physician Jomarie Longs, MD  Outpatient Primary MD for the patient is No primary care provider on file.  LOS - 6   No chief complaint on file.       Assessment & Plan   Lower extremity edema -Possibly chronic -Echocardiogram obtained showing an EF of 65-70%; dynamic obstruction in the outflow tract, mild concentric hypertrophy -Spoke with Cardiology regarding echocardiogram results, no need for followup.  -Lower extremity Doppler obtained was negative for DVT or Baker's cyst -Urine negative for protienuria -TSH 0.655, Albumin 3.6 -Consider compression stockings to help with pain and discomfort and keeping legs elevated -Continue on low dose coreg and lasix (triamterene/HCTZ discontinued) -Recommend checking creatinine and potassium weekly while admitted.   Transaminasemia -Resolved -May be dehydration vs medication effect from home meds of NSAIDS, tegretol, zyprexa, -present meds seroquel and lamictal may contribute -Hepatitis B surface antigen negative, hepatitis C antibody negative, HIV nonreactive -RUQ ultrasound: 2 cm gallstone versus tumefactive sludge, no sonographic signs of acute cholecystitis -Spoke with surgery, patient should follow up with [facility] upon discharge  Hyponatremia -Likely due to HCTZ -Resolved  Hx of positive ANA-->1:1280 -outpt follow up -no clinical evidence of vasculitis flare presently -ESR mildly elevated at 25 (normal <22)  Code Status: Full  Family Communication: None at bedside  Disposition Plan: Admitted to [facility]. Work-up for LE edema negative thus far. Likely chronic. Switched patient lasix, lab work stable. At this time will sign off. Please feel free to call with any questions.    Time  Spent in minutes 15 minutes  Procedures  Echocardiogram Lower extremity Doppler  Consults  [facility], Dr. Shirlee Latch, via phone  Lab Results  Component Value Date   PLT 293 08/10/2014    Medications  Scheduled Meds: . carvedilol  3.125 mg Oral BID WC  . furosemide  20 mg Oral Daily  . gabapentin  300 mg Oral TID  . lamoTRIgine  25 mg Oral Once  . [START ON 08/11/2014] lamoTRIgine  50 mg Oral Daily  . [START ON 08/12/2014] lamoTRIgine  75 mg Oral Daily  . loratadine  10 mg Oral Daily  . LORazepam  2 mg Intramuscular Once  . QUEtiapine  200 mg Oral TID  . QUEtiapine  200 mg Oral Once  . traZODone  150 mg Oral QHS   Continuous Infusions:  PRN Meds:.acetaminophen, alum & mag hydroxide-simeth, ziprasidone **AND** diphenhydrAMINE, ziprasidone **AND** diphenhydrAMINE, LORazepam, magnesium hydroxide, naproxen  Antibiotics    Anti-infectives    None        Subjective:   Stacie Daugherty seen and examined today.  Patient is upset because she continues to have swelling in her legs.  She would like someone to drain the fluid in her legs using a syringe.  She denies chest pain, shortness of breath, abdominal pain.  She feels she is ready to leave [facility].    Objective:   Filed Vitals:  08/09/14 0811 08/09/14 1754 08/10/14 0600 08/10/14 0601  BP: 122/73 118/80 124/71 120/85  Pulse: 80 70 77 84  Temp:   97.8 F (36.6 C)   TempSrc:      Resp:  18 20   Height:      Weight:      SpO2:        Wt Readings from Last 3 Encounters:  08/04/14 101.152 kg (223 lb)  01/01/14 103.42 kg (228 lb)  05/06/13 97.523 kg (215 lb)    No intake or output data in the 24 hours ending 08/10/14 1245  Exam  General: Well developed, well nourished  HEENT: NCAT, mucous membranes moist.   Cardiovascular: S1 S2 auscultated, no rubs, murmurs or gallops. Regular rate and rhythm.  Respiratory: Clear to auscultation  Abdomen: Soft, nontender, nondistended, + bowel  sounds  Extremities: warm dry without cyanosis clubbing. No pitting edema in LE.  Neuro: AAOx3, nonfocal  Data Review   Micro Results No results found for this or any previous visit (from the past 240 hour(s)).  Radiology Reports US Abdomen Limited Ruq  08/08/2014   CLINICAL DATA:  Initial encounter for elevated liver function tests.  EXAM: US ABDOMEN LIMITED - RIGHT UPPER QUADRANT  COMPARISON:  None.  FINDINGS: Gallbladder:  2.0 cm echogenic structure in the lumen of the gallbladder shows some posterior shadowing in is apparently non mobile. This could be a gallstone or tumefactive sludge. There is no associated gallbladder wall thickening or pericholecystic fluid. No sonographic Murphy's on.  Common bile duct:  Diameter: 4 mm  Liver:  The liver shows diffusely coarsened echotexture with decreased through-transmission, features consistent with steatosis.  IMPRESSION: 2 cm gallstone versus tumefactive sludge. No sonographic signs of acute cholecystitis.   Electronically Signed   By: Misty Stanley M.D.   On: 08/08/2014 09:57    CBC  Recent Labs Lab 08/06/14 0649 08/08/14 1932 08/10/14 0623  WBC  --  4.8 4.1  HGB 11.4* 11.4* 11.3*  HCT 34.8* 34.7* 35.4*  PLT  --  282 293  MCV  --  96.9 95.9  MCH  --  31.8 30.6  MCHC  --  32.9 31.9  RDW  --  13.0 13.0    Chemistries   Recent Labs Lab 08/06/14 0649 08/07/14 0638 08/08/14 1932 08/10/14 0623  NA 132*  --  137 137  K 4.3  --  4.4 3.9  CL 96  --  104 103  CO2 29  --  29 29  GLUCOSE 98  --  90 90  BUN 21  --  16 16  CREATININE 1.00  --  0.94 0.77  CALCIUM 9.2  --  8.8 8.7  AST  --  $R'27 29 25  'CS$ ALT  --  38* 34 32  ALKPHOS  --  45 52 43  BILITOT  --  0.7 0.3 0.4   ------------------------------------------------------------------------------------------------------------------ estimated creatinine clearance is 93.4 mL/min (by C-G formula based on Cr of  0.77). ------------------------------------------------------------------------------------------------------------------ No results for input(s): HGBA1C in the last 72 hours. ------------------------------------------------------------------------------------------------------------------ No results for input(s): CHOL, HDL, LDLCALC, TRIG, CHOLHDL, LDLDIRECT in the last 72 hours. ------------------------------------------------------------------------------------------------------------------ No results for input(s): TSH, T4TOTAL, T3FREE, THYROIDAB in the last 72 hours.  Invalid input(s): FREET3 ------------------------------------------------------------------------------------------------------------------ No results for input(s): VITAMINB12, FOLATE, FERRITIN, TIBC, IRON, RETICCTPCT in the last 72 hours.  Coagulation profile No results for input(s): INR, PROTIME in the last 168 hours.  No results for input(s): DDIMER in the last 72 hours.  Cardiac Enzymes  No results for input(s): CKMB, TROPONINI, MYOGLOBIN in the last 168 hours.  Invalid input(s): CK ------------------------------------------------------------------------------------------------------------------ Invalid input(s): POCBNP    Oaklyn Mans D.O. on 08/10/2014 at 12:45 PM  Between 7am to 7pm - Pager - [phone]  After 7pm go to [email or web address] - password [email or web address]  And look for the night coverage person covering for me after hours  [facility] Office  [phone]

## 2014-08-10 NOTE — Progress Notes (Signed)
Adult Psychoeducational Group Note  Date:  08/10/2014 Time:  9:37 PM  Group Topic/Focus:  Wrap-Up Group:   The focus of this group is to help patients review their daily goal of treatment and discuss progress on daily workbooks.  Participation Level:  Active  Participation Quality:  Intrusive and Redirectable  Affect:  Blunted, Irritable and Labile  Cognitive:  Alert  Insight: Lacking  Engagement in Group:  Defensive and Distracting  Modes of Intervention:  Discussion  Additional Comments:  Pts discussed how their day went. The first thing the patient said in group was that her day was terrible because they did not get to go outside today and she was very upset about it.Pt stated she was also upset that she did not get to watch one of her shows. Writer offered support, however she was very resistant to this. Pt stated her supports are her mom and her husband. However, pt stated her husband is not reliable and her mother has been sick and she has not been able to see her which has been frustrating. Pt stated she could add her church community to her supports.  Clint Bolder 08/10/2014, 9:37 PM

## 2014-08-10 NOTE — Plan of Care (Signed)
Problem: Alteration in mood Goal: STG-Patient is able to attend at least 2 groups per day Outcome: Progressing According to progress notes, pt attended at least 2 groups on 08/09/14.

## 2014-08-10 NOTE — Progress Notes (Signed)
D: Pt denies SI/HI/AV. Pt at times can be labile and has to be redirected. Pt rates depression at a 4, anxiety at a 3, and Helplessness/hopelessness at a 6.  A: Pt was offered support and encouragement. Pt was given scheduled medications but she is refusing certain medications. Pt was encourage to attend groups. Q 15 minute checks were done for safety.  R:Pt attends groups and interacts well with peers and staff. Pt taking some medication.Pt receptive to treatment and safety maintained on unit.

## 2014-08-10 NOTE — [facility] Group Notes (Signed)
[place] Group Notes:  (Clinical Social Work)  08/10/2014   11:15am-12:00pm  Summary of Progress/Problems:  The main focus of today's process group was to listen to a variety of genres of music and to identify that different types of music provoke different responses.  The patient then was able to identify personally what was soothing for them, as well as energizing.  Handouts were used to record feelings evoked, as well as how patient can personally use this knowledge in sleep habits, with depression, and with other symptoms.  The patient was manic and hyperverbal, intrusive throughout group, difficult to redirect.  She sang very loudly, and other patients expressed annoyance with her.  She tried dancing with other patients, invading their personal space and that had to be addressed several times.  She remained pleasant and smiling, despite boundary setting by CSW that was persistent.  Type of Therapy:  Music Therapy   Participation Level:  Active  Participation Quality:  Attentive, Intrusive and Sharing  Affect:  Not congruent  Cognitive:  Disorganized  Insight:  Limited  Engagement in Therapy:  Engaged  Modes of Intervention:   Activity, Exploration  Selmer Dominion, LCSW 08/10/2014, 12:30pm

## 2014-08-10 NOTE — Progress Notes (Addendum)
D: Pt has depressed affect and mood.  Pt reports "today hasn't been a good day.  I feel drowsy and kind of depressed."  Pt reports she had a good visit with her husband and her son, stating "it was nice."  Pt denies SI/HI, denies hallucinations.  Pt was visible in the milieu and interacted with select peers.  Pt reported there are ants in her room. A: AC and charge nurse notified that pt's room has ants.  Pt informed she could sleep in the quiet room tonight and pt declined, stating "I'm not sleeping in the quiet room.  They can take care of it tomorrow."  Pt's husband requested a list of scheduled medications pt is taking and he was provided with this since pt has consented for him to receive information regarding her treatment.  Pt refused scheduled Neurontin.  PRN medication administered for pain, see flowsheet.  Pt encouraged to elevate her feet and apply ice packs as needed.  Pt verbalized understanding.   R: Pt verbally contracts for safety.  Will continue to monitor and assess.

## 2014-08-11 MED ORDER — QUETIAPINE FUMARATE 300 MG PO TABS
300.0000 mg | ORAL_TABLET | Freq: Every day | ORAL | Status: DC
  Administered 2014-08-11: 300 mg via ORAL
  Filled 2014-08-11 (×2): qty 1

## 2014-08-11 MED ORDER — HYDROXYZINE HCL 25 MG PO TABS
25.0000 mg | ORAL_TABLET | Freq: Three times a day (TID) | ORAL | Status: DC | PRN
  Administered 2014-08-12 – 2014-08-17 (×5): 25 mg via ORAL
  Filled 2014-08-11: qty 8
  Filled 2014-08-11 (×5): qty 1

## 2014-08-11 MED ORDER — BENZOCAINE 10 % MT GEL
Freq: Four times a day (QID) | OROMUCOSAL | Status: DC | PRN
  Administered 2014-08-11 – 2014-08-19 (×5): via OROMUCOSAL
  Filled 2014-08-11: qty 9.4

## 2014-08-11 MED ORDER — QUETIAPINE FUMARATE 200 MG PO TABS
200.0000 mg | ORAL_TABLET | ORAL | Status: DC
  Administered 2014-08-12: 200 mg via ORAL
  Filled 2014-08-11 (×3): qty 1

## 2014-08-11 NOTE — Progress Notes (Signed)
Patient in day room at the beginning of the shift interacting with peers. Her brother and son visited. Patient remains intrusive, demanding and entitled. She reported that she had a rough day, ankles swollen and requested for heat. Writer encouraged patient to continue to place ice on it and that ice is better for inflammation. Patient receptive to encouragement and support. She denied SI/HI and denied Hallucinations. Q 15 minute check continues as ordered to maintain safety.
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
## 2014-08-11 NOTE — Progress Notes (Signed)
Adult Psychoeducational Group Note  Date:  08/11/2014 Time:  10:11 PM  Group Topic/Focus:  Wrap-Up Group:   The focus of this group is to help patients review their daily goal of treatment and discuss progress on daily workbooks.  Participation Level:  Active  Participation Quality:  Appropriate  Affect:  Appropriate  Cognitive:  Appropriate  Insight: Appropriate  Engagement in Group:  Engaged  Modes of Intervention:  Discussion  Additional Comments:  The patient expressed that she learned that she should do what it takes to feel well.  Nash Shearer 08/11/2014, 10:11 PM

## 2014-08-11 NOTE — Progress Notes (Signed)
D: Pt denies SI/HI/AV. Pt is very labile and continually has to be redirected. Pt rates depression at a 5, anxiety at a 3, and Helplessness/hopelessness at a 3.  A: Pt was offered support and encouragement. Pt was given scheduled medications. Pt was encourage to attend groups. Q 15 minute checks were done for safety.  R:Pt attends some groups and interacts well with peers. Pt taking medication.Pt receptive to treatment and safety maintained on unit.

## 2014-08-11 NOTE — Progress Notes (Signed)
D: Pt has labile affect and mood.  Pt reports she is "pissed because my husband didn't visit."  Pt reports that her friend visited and that it was a good visit.  Pt reports she "enjoyed music group" today.  Pt denied SI/HI, denied hallucinations.  Pt reports "I'm ready to go home."  Pt reports "I'm sad we didn't go outside today."  Pt attended evening group.  She is confrontational with multiple peers and remains labile and unpredictable.  She was yelling at peers because she wanted to watch a specific show that peers did not want to watch.  Pt's husband called and expressed multiple concerns.  He reported concern related to pt's room not being sprayed for ants.  He asked how pt was doing today and expressed concern related to pt's edema.  Pt's husband asked writer to make sure there were no ants near pt's bed.   A: PRN medication administered for anxiety, pain, see flowsheet.  Safety maintained.  Pt encouraged to elevate feet.  Pt provided with ice packs for feet.  Pt does not have TED hose because she remains unpredictable and verbally aggressive to peers.  On-call provider notified and writer was told that it is okay if pt does not have TED hose right now because of her unpredictability and agitation with peers.  Pt's husband notified that pt has been seen by hospitalist related to edema.  Pt's husband notified that medications were started after pt was seen by hospitalist and that pt is encouraged to elevate feet and apply ice packs as needed.  Pt's husband assured that writer would check for ants near pt's bed.  Writer cleaned floor of pt's room to try to get rid of ants.  Pt's husband informed that pt's room would be sprayed for ants tomorrow per report.  Pt's husband said "thank you."  Pt refused scheduled Neurontin.  Encouraged and supported pt.  Redirected pt as needed. R: Pt is resistant to staff interventions at times.  She is not easily redirected.  Pt verbally contracts for safety.  Will continue to  monitor and assess.

## 2014-08-11 NOTE — Progress Notes (Addendum)
Pt became agitated and was given 1mg  of ativan . Pt also requested two tylenol for foot pain a 6/10. Pt continues to have some ankle and foot swelling. Pt was cooperative and was taken via wheelchair to the cafeteria for lunch.

## 2014-08-11 NOTE — BHH Group Notes (Signed)
[place] LCSW Group Therapy  08/11/2014 1:15 pm  Type of Therapy: Process Group Therapy  Participation Level:  Active  Participation Quality:  Appropriate  Affect:  Flat  Cognitive:  Oriented  Insight:  Improving  Engagement in Group:  Limited  Engagement in Therapy:  Limited  Modes of Intervention:  Activity, Clarification, Education, Problem-solving and Support  Summary of Progress/Problems: Today's group addressed the issue of overcoming obstacles.  Patients were asked to identify their biggest obstacle post d/c that stands in the way of their on-going success, and then problem solve as to how to manage this.  Stacie Daugherty was eating lunch at the start of group.  While eating, she made several comments when other patients were talking that got a sharp reaction from others.  It is clear that she is wearing on other people's nerves.  After finishing eating.  She threw away her food container, sat back down and went to sleep for the remainder of the group.  She apologized at the end for missing the discussion.  [name] 08/11/2014   3:36 PM

## 2014-08-11 NOTE — Progress Notes (Signed)
Adult Psychoeducational Group Note  Date:  08/11/2014 Time:  1:16 PM  Group Topic/Focus:  Developing a Wellness Toolbox:   The focus of this group is to help patients develop a "wellness toolbox" with skills and strategies to promote recovery upon discharge.  Participation Level:  None  Participation Quality:  Intrusive and Monopolizing  Affect:  Blunted and Irritable  Cognitive:  Disorganized  Insight: None  Engagement in Group:  Distracting, Monopolizing, Off Topic and Poor  Modes of Intervention:  Clarification, Discussion, Education and Limit-setting  Additional Comments:  Pt was present at the start of group, but was asked to excuse herself due to fact that she was extremely disruptive while in group.  Jabriel Vanduyne E 08/11/2014, 1:16 PM

## 2014-08-12 MED ORDER — DIPHENHYDRAMINE HCL 50 MG PO CAPS
50.0000 mg | ORAL_CAPSULE | Freq: Once | ORAL | Status: AC
  Administered 2014-08-12: 50 mg via ORAL
  Filled 2014-08-12: qty 1

## 2014-08-12 MED ORDER — LORAZEPAM 1 MG PO TABS
2.0000 mg | ORAL_TABLET | Freq: Once | ORAL | Status: AC
  Administered 2014-08-12: 2 mg via ORAL

## 2014-08-12 MED ORDER — HYDROXYZINE HCL 25 MG PO TABS: 25.0000 mg | ORAL_TABLET | Freq: Three times a day (TID) | ORAL | Status: DC | PRN

## 2014-08-12 MED ORDER — HYDROCORTISONE 1 % EX CREA: TOPICAL_CREAM | Freq: Three times a day (TID) | CUTANEOUS | Status: DC

## 2014-08-12 MED ORDER — QUETIAPINE FUMARATE ER 300 MG PO TB24
600.0000 mg | ORAL_TABLET | Freq: Every day | ORAL | Status: DC
  Filled 2014-08-12 (×2): qty 2

## 2014-08-12 MED ORDER — QUETIAPINE FUMARATE ER 400 MG PO TB24
400.0000 mg | ORAL_TABLET | Freq: Once | ORAL | Status: AC
  Administered 2014-08-12: 400 mg via ORAL
  Filled 2014-08-12: qty 1

## 2014-08-12 MED ORDER — DIPHENHYDRAMINE HCL 25 MG PO CAPS
ORAL_CAPSULE | ORAL | Status: AC
Start: 2014-08-12 — End: 2014-08-12
  Filled 2014-08-12: qty 2

## 2014-08-12 MED ORDER — LORAZEPAM 1 MG PO TABS
ORAL_TABLET | ORAL | Status: AC
  Filled 2014-08-12: qty 2

## 2014-08-12 MED ORDER — QUETIAPINE FUMARATE 50 MG PO TABS
50.0000 mg | ORAL_TABLET | Freq: Three times a day (TID) | ORAL | Status: DC | PRN
  Administered 2014-08-12 – 2014-08-13 (×2): 50 mg via ORAL
  Filled 2014-08-12 (×2): qty 1

## 2014-08-12 MED ORDER — QUETIAPINE FUMARATE ER 300 MG PO TB24
600.0000 mg | ORAL_TABLET | Freq: Every day | ORAL | Status: DC
  Administered 2014-08-13 – 2014-08-14 (×2): 600 mg via ORAL
  Filled 2014-08-12 (×3): qty 2

## 2014-08-12 MED ORDER — DIPHENHYDRAMINE-ZINC ACETATE 2-0.1 % EX CREA: TOPICAL_CREAM | Freq: Two times a day (BID) | CUTANEOUS | Status: DC

## 2014-08-12 NOTE — BHH Group Notes (Signed)
[place] LCSW Group Therapy  08/12/2014 2:19 PM   Type of Therapy:  Group Therapy  Participation Level:  Active  Participation Quality:  Attentive  Affect:  Appropriate  Cognitive:  Appropriate  Insight:  Improving  Engagement in Therapy:  Engaged  Modes of Intervention:  Clarification, Education, Exploration and Socialization  Summary of Progress/Problems: Today's group focused on relapse prevention.  We defined the term, and then brainstormed on ways to prevent relapse.  Sondos was in group for the entire time, with the exception of when she went to get some shoes.  She started out fairly quiet, and was also redirectable.  However, as time went by, she got more verbal and demonstrated a complete inability to read social cues as peers were giving both verbal and non-verbal messages that they did not appreciate her input.  In general, it was about getting "saved" and turning their life over to the [place].  And all their current problems stem from the fact that they are unbelievers.  Things got tense when she gave another patient feedback about her children who are living with other family members.  She backed off with an ultimatum from me.  [name] 08/12/2014 , 2:19 PM

## 2014-08-12 NOTE — BHH Group Notes (Signed)
[place] Group Notes:  (Nursing/MHT/Case Management/Adjunct)  Date:  08/12/2014  Time:  10:27 AM  Type of Therapy:  Nurse Education  Participation Level:  Minimal  Participation Quality:  Pt came to group as it was wrapping up.   Affect:  Appropriate and Defensive  Cognitive:  Alert and Oriented  Insight:  Improving  Engagement in Group:  Off Topic  Modes of Intervention:  Discussion, Education and Support  Summary of Progress/Problems: Pt arrived group as it was wrapping up. She was unable to name a SMART goal, but she was less intrusive than previously.   Marya Landry 08/12/2014, 10:27 AM

## 2014-08-12 NOTE — BHH Group Notes (Signed)
Adult Psychoeducational Group Note  Date:  08/12/2014 Time:  11:46 PM  Group Topic/Focus:  Wrap-Up Group:   The focus of this group is to help patients review their daily goal of treatment and discuss progress on daily workbooks.  Participation Level:  Active  Participation Quality:  Intrusive, Monopolizing and Redirectable  Affect:  Labile  Cognitive:  Disorganized  Insight: Lacking  Engagement in Group:  Lacking  Modes of Intervention:  Discussion  Additional Comments:  Stacie Daugherty stated that she was supposed to leave today but didn't because her sleep was off.  She was also upset because her husband and son didn't visit her.  She stated she went off because they haven't been outside and she stated she called [facility] when she has had complaints.  Victorino Sparrow A 08/12/2014, 11:46 PM

## 2014-08-12 NOTE — Progress Notes (Signed)
Patient came to the window and reported difficulty falling asleep. Writer offered patient benadryl. She received it and went to bed. She came back few minutes after that and asked writer if we have cameras to take pictures of her ankle and a bruise on her left arm. She said her husband noticed during one of his visits. Patient not on blood thinner or aspirin. Will notify incoming RN to have NP look at the bruise during the day may be the Ibuprofen needs to be discontinued.

## 2014-08-12 NOTE — Tx Team (Signed)
  Interdisciplinary Treatment Plan Update   Date Reviewed:  08/12/2014  Time Reviewed:  2:22 PM  Progress in Treatment:   Attending groups: Yes Participating in groups: Yes Taking medication as prescribed: Yes  Tolerating medication: Yes Family/Significant other contact made: Yes  Patient understands diagnosis: Yes  Discussing patient identified problems/goals with staff: Yes  See initial care plan Medical problems stabilized or resolved: Yes Denies suicidal/homicidal ideation: Yes  In tx team Patient has not harmed self or others: Yes  For review of initial/current patient goals, please see plan of care.  Estimated Length of Stay:  2-3 days  Reason for Continuation of Hospitalization: Medication stabilization Other; describe Mood lability  New Problems/Goals identified:  N/A  Discharge Plan or Barriers:   return home, follow up outpt  Additional Comments:  "I am OK , I still feel anxious about my ankle, I have some sleep issues, I was up at 4 am today and they gave me benadryl , but I still felt like I could not sleep , so I took a shower, now I feel kind of drowsy.'  The patient with some improvement of her agitation , anxiety as well as intrusive behavior. She continues to need prn medications to calm self down. Pt with periodic outbursts , needing redirection. Pt today continues to have multiple somatic complaints , is worried about her ankle swelling . O/E ; It appears to be improved today. Pt to continue to follow hospitalist recommendations. Pt continues to be focussed on getting her mother out of [facility] , which is a stressor for her. Discussed treatment and disposition plan with husband , Mr.Chamberlain - he is concerned about her anxiety about her mother's situation as well as her sleep issues.  Will continue medication changes.   Seroquel 800 scheduled.  Seroquel PRN  Pt is responding at a less than desired rate to medication intervention.  Will not be leaving here at her  previous baseline.  Attendees:  Signature: Steva Colder, MD 08/12/2014 2:22 PM   Signature: Ripley Fraise, LCSW 08/12/2014 2:22 PM  Signature:  08/12/2014 2:22 PM  Signature: Mayra Neer, RN 08/12/2014 2:22 PM  Signature:  08/12/2014 2:22 PM  Signature:  08/12/2014 2:22 PM  Signature:   08/12/2014 2:22 PM  Signature:    Signature:    Signature:    Signature:    Signature:    Signature:      Scribe for Treatment Team:   Ripley Fraise, LCSW  08/12/2014 2:22 PM
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
## 2014-08-12 NOTE — Progress Notes (Signed)
Stacie [facility] became very irate at dinner when staff would not fetch items for her. Charge RN and [facility], [facility], responded and spoke with her. Upon return to unit, Stacie D[facility]herty continued to get angrier, calling "[place]" and yelling about how wrong it was to not be allowed to go outside. She continued using profanity and insults, yelling up and down the hall. She was given PRN Vistaril and Seroquel, with results pending.

## 2014-08-12 NOTE — Progress Notes (Signed)
Pt refused to leave cafeteria at appointed time, per [facility] staff. When this writer approached, pt said she had not had time to eat because staff would not help her get items from the line. Pt has been able to ambulate on unit. She was also upset that staff had not pushed wheelchair to her. This Probation officer explained that we would be doing a disservice to her if we performed tasks for her that she is able to do herself. She did not accept this, but did accept the idea that wheeling her chair with her hands was good upper-body exercise. She wheeled chair back to unit by herself, with a few breaks. Pt's behavior was at her baseline once back to unit. Will continue to monitor for need/safety.

## 2014-08-12 NOTE — Progress Notes (Signed)
Pt approached Probation officer upon sight this a.m. "Nurse, I would like to make a complaint." She then says she is upset that a staff member did not push her wheelchair to the cafeteria this a.m. While she says this, she is walking in front of the nurses' station without any issue and appears strong enough to use wheelchair by self. She has since been overheard talking about the various complaints the wishes to "call [facility]" about. Pt refused a.m. Neurontin, but was pleasant as she took all other scheduled medications without complaint. She denies SI/HI/AVH. From self-inventory: fair sleep (says sleep med wasn't helpful), a good appetite, and low energy. She rates her depression a 3, hopelessness a 4, and anxiety a 5.   Medications have been given as ordered. Q15 safety checks have been maintained. Support/encouragement has been provided. Pt remains safe and continues with treatment. Will continue to monitor for needs/safety.

## 2014-08-13 MED ORDER — ZIPRASIDONE HCL 20 MG PO CAPS
20.0000 mg | ORAL_CAPSULE | Freq: Two times a day (BID) | ORAL | Status: DC | PRN
  Administered 2014-08-13 – 2014-08-16 (×4): 20 mg via ORAL
  Filled 2014-08-13 (×3): qty 1

## 2014-08-13 MED ORDER — ZIPRASIDONE MESYLATE 20 MG IM SOLR
20.0000 mg | Freq: Two times a day (BID) | INTRAMUSCULAR | Status: DC | PRN
  Filled 2014-08-13: qty 20

## 2014-08-13 MED ORDER — ZIPRASIDONE HCL 20 MG PO CAPS
ORAL_CAPSULE | ORAL | Status: AC
  Filled 2014-08-13: qty 1

## 2014-08-13 MED ORDER — LORAZEPAM 1 MG PO TABS
2.0000 mg | ORAL_TABLET | ORAL | Status: AC | PRN
  Administered 2014-08-13: 2 mg via ORAL
  Filled 2014-08-13: qty 2

## 2014-08-13 MED ORDER — BENZTROPINE MESYLATE 0.5 MG PO TABS
0.5000 mg | ORAL_TABLET | Freq: Two times a day (BID) | ORAL | Status: DC
  Administered 2014-08-13 – 2014-08-14 (×2): 0.5 mg via ORAL
  Filled 2014-08-13 (×6): qty 1

## 2014-08-13 NOTE — Progress Notes (Addendum)
Recreation Therapy Notes  03.16.2016 @ approximately 3:15pm. Per MD order LRT met with patient to take patient outside for individual walking time, as it was reported by patient RN that patient was volatile during general recreation time with unit earlier during the day. MD additionally asked LRT to introduce stress management techniques during walk. AC accompanied LRT and patient off unit. Patient was able to complete approximately 7 laps around serenity garden without issue, however LRT was unable to introduce stress management techniques, as patient was not receptive to information. Each time LRT attempted to direct conversation towards stress management patient would divert conversation to topic of her choice, for example the summer football camp that she is thinking about sending her son to and her sex life. Despite numerous attempts by LRT to redirect patient to education about stress management patient refused to engage in education or conversation about any topic not of patient choosing.   Patient required wheelchair to leave unit, stating that her ankles were hurting and she would be unable to walk. LRT stated that she could use her wheel chair to leave the unit, but the expectation would be that she walk once she was outside. Patient agreed to terms and exhibited no issue walking when she arrived to courtyard. Patient pace and balance were consistent with able bodied individual. Patient additionally attempted to plat basketball during 1:1, LRT allowed patient to attempt one basket and then resume walk. Patient tolerated and complied with LRT instruction.   Patient requested to drink from fountain in serenity garden, stating she splashed water on her face from fountain earlier in the day and "anointed myself." LRT denied patient request, patient tolerated redirection.   Patient returned to unit without issue.   MD reports patient is scheduled to d/c lon Friday 03.18.2016. LRT will attempt to work  with patient again prior to admission.   Laureen Ochs Breindy Meadow, LRT/CTRS  Lane Hacker 08/13/2014 3:43 PM

## 2014-08-13 NOTE — Progress Notes (Signed)
Patient ID: Stacie [facility], female   DOB: 07/14/1960, 54 y.o.   MRN: [id number]  Patient during outside time at courtyard was inappropriate. Patient was approaching her peers and kept repeating, "You are all fornicators." Patient came to courtyard in a wheelchair but shortly after was up and walking. Patient was also seen playing basketball which included running and jumping to shoot hoops. Patient went to courtyard fountain and started to take the water in her hand and placed it on her face. "It's holy water!" she exclaimed. Writer and MHT spoke to patient about respecting her peers personal space and keeping hands out of the fountain. Patient was hard to redirect and was visibly making other patients uncomfortable. Patient's RN notified.

## 2014-08-13 NOTE — [facility] Group Notes (Signed)
[facility] Group Therapy  08/13/2014 1:40 PM  Type of Therapy:  Group Therapy  Participation Level:  Active  Participation Quality:  Attentive  Affect:  Flat  Cognitive:  Disorganized  Insight:  Limited  Engagement in Therapy:  Limited  Modes of Intervention:  Discussion, Education, Socialization and Support  Summary of Progress/Problems:[employer] speaker came to talk about his personal journey with substance abuse and mental illness. Group members were challenged to process ways by which to relate to the speaker. [place] speaker provided handouts and educational information pertaining to groups and services offered by the [facility]. Riva attended group and stayed the entire time. She made a few random comments through out group but she was not intrusive or monopolizing.     Hyatt,Candace 08/13/2014, 1:40 PM
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
## 2014-08-13 NOTE — Plan of Care (Signed)
Problem: Ineffective individual coping Goal: STG: Patient will remain free from self harm Outcome: Progressing Patient remains free from self harm. Pt denies having any suicidal thoughts today. 15 minute checks continued per protocol for patient safety.   Problem: Diagnosis: Increased Risk For Suicide Attempt Goal: STG-Patient Will Comply With Medication Regime Outcome: Progressing Patient has adhered to medication regimen today with the exception of refuses her 1400 dose of neurontin, d/t "I usually take that a night."  Problem: Alteration in mood Goal: STG-Patient does not harm self or others Outcome: Progressing Patient has not harmed self or others today.

## 2014-08-13 NOTE — H&P (Signed)
Patient extremely agitated, will reorder Geodon 20 IM BID prn along with Ativan 2 mg prn. added cogentin due to concerns with possible EPS being on dual antipsychotic therapy. Review of most recent KG not QT interval within limits of normal and renal and hepatic fxns WNL
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
## 2014-08-14 MED ORDER — FLUPHENAZINE HCL 5 MG PO TABS
5.0000 mg | ORAL_TABLET | Freq: Two times a day (BID) | ORAL | Status: DC
  Administered 2014-08-14 – 2014-08-15 (×3): 5 mg via ORAL
  Filled 2014-08-14 (×5): qty 1

## 2014-08-14 MED ORDER — TEMAZEPAM 15 MG PO CAPS
15.0000 mg | ORAL_CAPSULE | Freq: Every day | ORAL | Status: DC
Start: 2014-08-14 — End: 2014-08-16
  Administered 2014-08-14 – 2014-08-15 (×2): 15 mg via ORAL
  Filled 2014-08-14 (×2): qty 1

## 2014-08-14 MED ORDER — TRAZODONE HCL 100 MG PO TABS
100.0000 mg | ORAL_TABLET | Freq: Every evening | ORAL | Status: DC | PRN
Start: 2014-08-14 — End: 2014-08-19
  Administered 2014-08-17 – 2014-08-18 (×2): 100 mg via ORAL
  Filled 2014-08-14: qty 1
  Filled 2014-08-14: qty 4
  Filled 2014-08-14: qty 1

## 2014-08-14 MED ORDER — DOCUSATE SODIUM 100 MG PO CAPS
100.0000 mg | ORAL_CAPSULE | Freq: Two times a day (BID) | ORAL | Status: DC
  Administered 2014-08-14 – 2014-08-19 (×11): 100 mg via ORAL
  Filled 2014-08-14 (×9): qty 1
  Filled 2014-08-14 (×2): qty 6
  Filled 2014-08-14 (×4): qty 1

## 2014-08-14 MED ORDER — BENZTROPINE MESYLATE 1 MG PO TABS
1.0000 mg | ORAL_TABLET | Freq: Two times a day (BID) | ORAL | Status: DC
  Administered 2014-08-14 – 2014-08-19 (×10): 1 mg via ORAL
  Filled 2014-08-14 (×6): qty 1
  Filled 2014-08-14: qty 8
  Filled 2014-08-14 (×2): qty 1
  Filled 2014-08-14: qty 8
  Filled 2014-08-14 (×6): qty 1

## 2014-08-14 NOTE — BHH Group Notes (Signed)
The focus of this group is to educate the patient on the purpose and policies of crisis stabilization and provide a format to answer questions about their admission.  The group details unit policies and expectations of patients while admitted.  Patient attended 0900 nurse education orientation group this morning.  Patient continued to talk and interrupt group, talking to patients about their problems and medications they should take.  Nurse attempted to redirect patient numerous times.
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
## 2014-08-14 NOTE — Progress Notes (Addendum)
Patient ID: Stacie Daugherty, female   DOB: December 14, 1960, 54 y.o.   MRN: [id number]  Pt currently presents with a flat and then animated affect and labile behavior. Per self inventory, pt rates depression and hopelessness at a 0 and anxiety at a 3. Pt's daily goal is "having peace; not getting on other patients nerves" and they intend to do so by "try to act like I'm married." Pt reports good sleep, fair concentration and a fair appetite.   Pt provided with medications per providers orders. Pt's labs and vitals were monitored throughout the day. Pt supported emotionally and encouraged to express concerns and questions. Pt educated on medications.   Pt's safety ensured with 15 minute and environmental checks. Pt currently denies SI/HI and A/V hallucinations. Pt verbally agrees to seek staff if SI/HI or A/VH occurs and to consult with staff before acting on these thoughts. Pt took scheduled MD medications today. Pt still intrusive and hypersexual in the common areas today. Pt still has edematous ankles. Pt encouraged to elevate and rest her legs.

## 2014-08-14 NOTE — Tx Team (Signed)
  Interdisciplinary Treatment Plan Update   Date Reviewed:  08/14/2014  Time Reviewed:  2:59 PM  Progress in Treatment:   Attending groups: Yes Participating in groups: Not appropriately for the most part Taking medication as prescribed: Yes  Tolerating medication: Yes Family/Significant other contact made: Yes  Patient understands diagnosis: Yes  Discussing patient identified problems/goals with staff: Yes  See initial care plan Medical problems stabilized or resolved: Yes Denies suicidal/homicidal ideation: Yes  In tx team Patient has not harmed self or others: Yes  For review of initial/current patient goals, please see plan of care.  Estimated Length of Stay:  4-5 days  Reason for Continuation of Hospitalization: Mania Medication stabilization  New Problems/Goals identified:  N/A  Discharge Plan or Barriers:   return home, follow up outpt  Additional Comments:  Pt continues intrusive, pressured, grandiose and difficult to redirect.   Prolixin added today to augment the Seroquel and Lamictal.  Attendees:  Signature: Steva Colder, MD 08/14/2014 2:59 PM   Signature: Ripley Fraise, [place] 08/14/2014 2:59 PM  Signature:  08/14/2014 2:59 PM  Signature:  08/14/2014 2:59 PM  Signature: Darrol Angel, RN 08/14/2014 2:59 PM  Signature:  08/14/2014 2:59 PM  Signature:   08/14/2014 2:59 PM  Signature:    Signature:    Signature:    Signature:    Signature:    Signature:      Scribe for Treatment Team:   Ripley Fraise, LCSW  08/14/2014 2:59 PM

## 2014-08-14 NOTE — Progress Notes (Signed)
Patient's husband, Iona Beard, called and requested RN that has taken care of his wife today.  Patient asked if his wife had been manic today.  This nurse advised charge nurse, patient's nurse who was not available when husband called, and MD about patient's husband's phone call.  Husband stated he had talked to MD and SW today and wanted to know if his wife was manic today.   Patient's husband was demanding and irritated.  Stated he was going to call Javier Docker about his wife.  MD and staff informed of husband's call to nurse.

## 2014-08-14 NOTE — Progress Notes (Signed)
Pt has been up and active on the unit this evening.  She has been loud and intrusive, but has been redirectable by staff. She has taken her medications without incident, although she did question the Restoril at bedtime.  She thought it was Risperdal, but Probation officer explained that was another medication.  Pt attended evening karaoke and participated.  She has used the wheelchair some, but only when her ankles were hurting her.  She received Naproxen at the beginning of the shift for ankle pain.  When Probation officer took her to [name] for [employer], she was dancing until staff instructed her to sit down.  There have been no incidents of extreme agitation or cursing this evening by the patient this evening.  Pt is hoping to discharge soon.  Pt makes her needs known to staff.  Support and encouragement offered.  Safety maintained with q15 minute checks.

## 2014-08-14 NOTE — BHH Group Notes (Signed)
Adult Psychoeducational Group Note  Date:  08/14/2014 Time:  2:04 AM  Group Topic/Focus:  Wrap-Up Group:   The focus of this group is to help patients review their daily goal of treatment and discuss progress on daily workbooks.  Participation Level:  Active  Participation Quality:  Monopolizing and Redirectable  Affect:  Labile  Cognitive:  Disorganized and Delusional  Insight: Lacking  Engagement in Group:  Limited  Modes of Intervention:  Discussion  Additional Comments:  Zoraida expressed that she was supposed to go home but didn't because of her medications.  She was disruptive, rude and needed constant redirection.  Victorino Sparrow A 08/14/2014, 2:04 AM

## 2014-08-14 NOTE — Progress Notes (Signed)
Patient ID: Stacie Daugherty, female   DOB: 1961/03/15, 54 y.o.   MRN: [id number]  Writer assumed care of this patient at 1300. On first approach patient was sitting in the hallway and was on the phone. Patient was directed to hang up due to group. Patient unable to attend group due to behavior. Patient is taking medications (see MAR) for this Probation officer. Patient's husband Stacie Daugherty called and Probation officer returned call with written consent from patient. Writer spoke to Mr. Stacie Daugherty about patient's medications, sleep, and treatment plan for patient. Mr. Stacie Daugherty verbalized understanding and reported he will visit tonight during visitation. After Probation officer got off phone with patient's husband, it was brought to writer's attention that patient was bothering her peers in day room. Patient's agitation began and was given PRN Geodon. Patient went outside with A/C and recreation therapist in order for patient to be monitored. Patient was given PRN Tylenol for ankle pain and Orajel for tooth pain. Patient reported relief for both. Writer spoke to patient about elevating her lower extremities because edema is still noted. Writer spoke to [place] about the importance of staying seated for a period of time in order to try to elevate feet which will aid in decreasing swelling. Patient at this time is unable to slow down enough to elevate her feet. Patient uses wheelchair at times but is capable of walking (and dancing) when patient sees fit. Q15 minute safety checks are maintained.

## 2014-08-14 NOTE — BHH Group Notes (Addendum)
[facility] Group Notes:  (Counselor/Nursing/MHT/Case Management/Adjunct)  08/14/2014 1:15PM  Type of Therapy:  Group Therapy  Participation Level:  Was uninvited due to the inability to follow the expectation of not talking out and laughing.  She was excorted out in the first 5 minutes.  Summary of Progress/Problems: The topic for group was balance in life.  Pt participated in the discussion about when their life was in balance and out of balance and how this feels.  Pt discussed ways to get back in balance and short term goals they can work on to get where they want to be.    [name] 08/14/2014 1:35 PM

## 2014-08-14 NOTE — Tx Team (Signed)
Initial Interdisciplinary Treatment Plan   PATIENT STRESSORS: Health problems Loss of dog Marital or family conflict Medication change or noncompliance   PATIENT STRENGTHS: Average or above average intelligence General fund of knowledge Religious Affiliation Supportive family/friends   PROBLEM LIST: Problem List/Patient Goals Date to be addressed Date deferred Reason deferred Estimated date of resolution  Depression  " My meds were making me depressed" 08/03/13   D/c  Increased risk for SI 08/03/13     "Bipolar D/O" Mania 08/03/13                                          DISCHARGE CRITERIA:  Improved stabilization in mood, thinking, and/or behavior Medical problems require only outpatient monitoring Need for constant or close observation no longer present Reduction of life-threatening or endangering symptoms to within safe limits Verbal commitment to aftercare and medication compliance  PRELIMINARY DISCHARGE PLAN: Attend PHP/IOP  PATIENT/FAMIILY INVOLVEMENT: This treatment plan has been presented to and reviewed with the patient, Stacie Daugherty.  The patient and family have been given the opportunity to ask questions and make suggestions.  Ohn Bostic A 08/14/2014, 4:34 AM

## 2014-08-15 MED ORDER — FLUPHENAZINE HCL 10 MG PO TABS
10.0000 mg | ORAL_TABLET | Freq: Two times a day (BID) | ORAL | Status: DC
  Administered 2014-08-15 – 2014-08-18 (×6): 10 mg via ORAL
  Filled 2014-08-15 (×8): qty 1

## 2014-08-15 MED ORDER — FUROSEMIDE 40 MG PO TABS
40.0000 mg | ORAL_TABLET | Freq: Every day | ORAL | Status: DC
  Administered 2014-08-16 – 2014-08-19 (×4): 40 mg via ORAL
  Filled 2014-08-15 (×4): qty 1
  Filled 2014-08-15: qty 3
  Filled 2014-08-15: qty 1

## 2014-08-15 MED ORDER — QUETIAPINE FUMARATE ER 400 MG PO TB24
400.0000 mg | ORAL_TABLET | Freq: Every day | ORAL | Status: DC
  Administered 2014-08-15 – 2014-08-18 (×4): 400 mg via ORAL
  Filled 2014-08-15 (×2): qty 1
  Filled 2014-08-15: qty 4
  Filled 2014-08-15 (×3): qty 1

## 2014-08-15 MED ORDER — FUROSEMIDE 20 MG PO TABS
20.0000 mg | ORAL_TABLET | Freq: Once | ORAL | Status: AC
  Administered 2014-08-15: 20 mg via ORAL
  Filled 2014-08-15: qty 1
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
## 2014-08-15 NOTE — BHH Group Notes (Signed)
[facility] LCSW Aftercare Discharge Planning Group Note   08/15/2014 11:26 AM  Participation Quality:  Halle was asked to leave group because she was continuously interrupting people. She was given several warnings.  Hyatt,Candace

## 2014-08-16 MED ORDER — TEMAZEPAM 7.5 MG PO CAPS
7.5000 mg | ORAL_CAPSULE | Freq: Every day | ORAL | Status: DC
  Administered 2014-08-16: 7.5 mg via ORAL
  Filled 2014-08-16: qty 1

## 2014-08-16 NOTE — BHH Group Notes (Signed)
[facility] Group Notes:  (Clinical Social Work)  08/16/2014  11:15-12:00PM  Summary of Progress/Problems:   The main focus of today's process group was to discuss patients' feelings about hospitalization, the stigma attached to mental health, and sources of motivation to stay well.  We then worked to identify a specific plan to avoid future hospitalizations when discharged from the hospital for this admission.  The patient expressed some irritability with CSW because CSW made multiple attempts in the first 10 minutes of group to get her to get coffee then go sit down.  She finally did sit, and participated to some degree.  Type of Therapy:  Group Therapy - Process  Participation Level:  Active  Participation Quality:  Resistant and Sharing  Affect:  Excited  Cognitive:  Disorganized  Insight:  Limited  Engagement in Therapy:  Limited  Modes of Intervention:  Exploration, Discussion  Selmer Dominion, LCSW 08/16/2014, 1:16 PM

## 2014-08-16 NOTE — Progress Notes (Signed)
D: Pt has continued to be loud, intrusive, and disruptive to the milieu tonight. She was able to be redirected. She complains of pain in her feet and her husband would like someone to look at her feet again.  A: Support given. Verbalization encouraged. Encouraged pt to lie down in the bed and put her feet up to decrease swelling. Pt encouraged to come to staff with any concerns. Medications given as prescribed.  R: Pt is receptive. No complaints of pain or discomfort at this time. Q15 min safety checks maintained. Will continue to monitor.
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
## 2014-08-17 LAB — BASIC METABOLIC PANEL
Anion gap: 11 (ref 5–15)
BUN: 15 mg/dL (ref 6–23)
CO2: 26 mmol/L (ref 19–32)
Calcium: 9.3 mg/dL (ref 8.4–10.5)
Chloride: 102 mmol/L (ref 96–112)
Creatinine, Ser: 0.91 mg/dL (ref 0.50–1.10)
GFR calc Af Amer: 83 mL/min — ABNORMAL LOW (ref >90)
GFR calc non Af Amer: 71 mL/min — ABNORMAL LOW (ref >90)
Glucose, Bld: 101 mg/dL — ABNORMAL HIGH (ref 70–99)
Potassium: 4.5 mmol/L (ref 3.5–5.1)
Sodium: 139 mmol/L (ref 135–145)

## 2014-08-17 NOTE — BHH Group Notes (Signed)
The focus of this group is to educate the patient on the purpose and policies of crisis stabilization and provide a format to answer questions about their admission.  The group details unit policies and expectations of patients while admitted.  Patient attended 0900 nurse education orientation group this morning.  Patient actively participated, appropriate affect, alert, appropriate insight and engagement.  Today patient will work on 3 goals for discharge.  

## 2014-08-17 NOTE — Plan of Care (Signed)
Problem: Consults Goal: Mania Patient Education See Patient Education Module for education specifics.  Outcome: Completed/Met Date Met:  08/17/14 Discussed loud/intrusive behavior with patient.
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
## 2014-08-17 NOTE — Progress Notes (Signed)
D: Pt's mood is labile. She frequently has to be redirected for being loud, disruptive and intrusive. Pt woke up around 4:30am and states that she urinated in the bed for the 3rd night in a row.  A: Support given. Verbalization encouraged. Medications given as prescribed. Pt encouraged to come to staff with any concerns.  R: Pt is receptive. No complaints of pain or discomfort at this time. Q15 min safety checks maintained. Will continue to monitor pt.

## 2014-08-17 NOTE — BHH Group Notes (Signed)
[place] Group Notes:  (Clinical Social Work)  08/17/2014   11:15am-12:00pm  Summary of Progress/Problems:  The main focus of today's process group was to listen to a variety of genres of music and to identify that different types of music provoke different responses.  The patient then was able to identify personally what was soothing for them, as well as energizing.  Handouts were used to record feelings evoked, as well as how patient can personally use this knowledge in sleep habits, with depression, and with other symptoms.  The patient expressed understanding of concepts, as well as knowledge of how each type of music affected him/her and how this can be used at home as a wellness/recovery tool.  She was visibly happy, smiling, singing, dancing throughout group, but appropriately so.  Type of Therapy:  Music Therapy   Participation Level:  Active  Participation Quality:  Attentive and Sharing  Affect:  Excited  Cognitive:  Oriented  Insight:  Engaged  Engagement in Therapy:  Engaged  Modes of Intervention:   Activity, Exploration  Selmer Dominion, LCSW 08/17/2014, 12:30pm

## 2014-08-17 NOTE — Progress Notes (Addendum)
D:  Patient's self inventory sheet, patient had fair sleep last night, no sleep medication administered.  Good appetite, normal energy level, poor concentration.  Rated depression and  hopeless 3, anxiety 4.  Denied withdrawals.  Denied SI.  Physical problems, swollen ankle/feet.  Worst pain #7 in past 24 hours, feet.  Pain medication is helpful.  Goal is to try to get out of here.  Plans to chill out and try not to get in trouble.  Cavity fell out and mouth hurting.  No discharge plans.  No problems anticipated after discharge.  "Thanks for helping to heal me." A:  Medications administered per MD orders.  Emotional support and encouragement given patient. R:  Denied SI and HI.  Denied A/V hallucinations.  Patient has denied pain while talking to nurse.   Safety maintained with 15 minute checks.  Patient became upset when 2 other patient's had an verbal altercation.  Patient given vistaril at 1310.  Patient was calm in the afternoon.  Patient's husband, Stacie Daugherty phone [phone], would like MD to call him Monday morning about patient's status/discharge.

## 2014-08-18 MED ORDER — FLUPHENAZINE DECANOATE 25 MG/ML IJ SOLN
25.0000 mg | INTRAMUSCULAR | Status: DC
  Filled 2014-08-18: qty 1

## 2014-08-18 MED ORDER — FLUPHENAZINE HCL 5 MG PO TABS
10.0000 mg | ORAL_TABLET | Freq: Every day | ORAL | Status: DC
  Administered 2014-08-19: 10 mg via ORAL
  Filled 2014-08-18 (×2): qty 1
  Filled 2014-08-18: qty 20
  Filled 2014-08-18: qty 15

## 2014-08-18 MED ORDER — LAMOTRIGINE 100 MG PO TABS
100.0000 mg | ORAL_TABLET | Freq: Every day | ORAL | Status: DC
  Administered 2014-08-19: 100 mg via ORAL
  Filled 2014-08-18: qty 1
  Filled 2014-08-18: qty 4
  Filled 2014-08-18: qty 1

## 2014-08-18 MED ORDER — FLUPHENAZINE HCL 5 MG PO TABS
15.0000 mg | ORAL_TABLET | Freq: Every evening | ORAL | Status: DC
  Administered 2014-08-18: 15 mg via ORAL
  Filled 2014-08-18 (×4): qty 3

## 2014-08-18 NOTE — Progress Notes (Signed)
D: Pt's mood is euthymic tonight. She was able to go to group tonight and successfully participate with her peers without being loud and intrusive. She has been speaking in a normal tone of voice and does not laugh at everything as she was previously doing. She has slept well tonight. She has gotten at least 6 hours of uninterrupted sleep. A: Support given. Verbalization encouraged. Pt encouraged to come to staff with any concerns. Medications given as prescribed.  R: Pt is receptive. No complaints of pain or discomfort at this time. Q32min safety checks maintained. Will continue to monitor.

## 2014-08-18 NOTE — Plan of Care (Signed)
Problem: Consults Goal: Depression Patient Education See Patient Education Module for education specifics.  Outcome: Completed/Met Date Met:  08/18/14 Nurse discussed depression coping skills with patient.

## 2014-08-18 NOTE — BHH Group Notes (Signed)
[facility] LCSW Group Therapy  08/18/2014 , 4:05 PM   Type of Therapy:  Group Therapy  Participation Level:  Pt was not invited to group based on experience in AM group.  Summary of Progress/Problems: Today's group focused on the term Diagnosis.  Participants were asked to define the term, and then pronounce whether it is a negative, positive or neutral term.  [name] 08/18/2014 , 4:05 PM
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
## 2014-08-18 NOTE — Progress Notes (Addendum)
Patient refused prolixin injection this afternoon.  MD discussed advantages of monthly injection instead of daily pills, but patient continued to refuse prolixin injection.  Patient also talked about her husband this afternoon.  Stated she has been married 13 years.  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  Patient stated her mother told her not to marry him because she would be living in hell.  Patient stated she has been living in hell for 13 years.   Patient laughed and said she was going to have her husband committed here in one week.  Patient told us his complete name and that he would be a patient here.    Patient's husband called this afternoon, checking on patient.  Nurse told husband that patient took all her po medications but refused the prolixin injection.  Husband stated he would be visiting his wife tonight.

## 2014-08-18 NOTE — Progress Notes (Addendum)
D:   Patient denied SI and HI, contracts for safety.  Denied A/V hallucinations.  Denied pain.  Patient has been walking in hallway without use of wheelchair.  Patient continues to laugh with peers and staff.  Patient is not as intrusive as she has been in the past.  Patient's self inventory sheet, patient slept good last night, no sleep medication given.  Good appetite, high energy level, good concentration.  Rated anxiety #4, denied depression and hopelessness.  Denied withdrawals.  Denied HI.  Physical problems of swollen ankles, worst pain in past 24 hours is #5.   Pain medication is helpful.  Goal is to "try to get out of here today."  Plans to "Stay quiet and be myself."  No discharge plans.  No problems anticipated after discharge. A:  Medications administered per MD orders.  Emotional support and encouragement given patient. R:  Safety maintained with 15 minute checks.

## 2014-08-18 NOTE — B[facility] Group Notes (Signed)
[facility] LCSW Aftercare Discharge Planning Group Note   08/18/2014 10:17 AM  Participation Quality:  Disruptive  Mood/Affect:  Excited  Depression Rating:  denies  Anxiety Rating:  denies  Thoughts of Suicide:  No Will you contract for safety?   NA  Current AVH:  No  Plan for Discharge/Comments:  Continues disruptive, intrusive, unable to stay quiet despite reminders and redirection.  Mood good despite being told she would not be discharged today.  Told her the agreement among CSW, Dr and husband is that a good night sleep will result in d/c tomorrow.  Transportation Means: husband  Supports: husband  [place], [place] B

## 2014-08-19 MED ORDER — CARVEDILOL 6.25 MG PO TABS
6.2500 mg | ORAL_TABLET | Freq: Two times a day (BID) | ORAL | Status: DC
  Administered 2014-08-19: 6.25 mg via ORAL
  Filled 2014-08-19: qty 1
  Filled 2014-08-19: qty 6
  Filled 2014-08-19 (×2): qty 1
  Filled 2014-08-19: qty 6
  Filled 2014-08-19: qty 2

## 2014-08-19 MED ORDER — FLUPHENAZINE DECANOATE 25 MG/ML IJ SOLN: 25.0000 mg | INTRAMUSCULAR | Status: DC

## 2014-08-19 MED ORDER — CETIRIZINE HCL 10 MG PO TABS: 10.0000 mg | ORAL_TABLET | Freq: Every day | ORAL | Status: DC

## 2014-08-19 MED ORDER — DOCUSATE SODIUM 100 MG PO CAPS: 100.0000 mg | ORAL_CAPSULE | Freq: Two times a day (BID) | ORAL | Status: DC

## 2014-08-19 MED ORDER — NAPROXEN 500 MG PO TABS: 500.0000 mg | ORAL_TABLET | Freq: Two times a day (BID) | ORAL | Status: DC | PRN

## 2014-08-19 MED ORDER — ACETAMINOPHEN 500 MG PO TABS: 500.0000 mg | ORAL_TABLET | Freq: Four times a day (QID) | ORAL | Status: DC | PRN

## 2014-08-19 MED ORDER — FUROSEMIDE 40 MG PO TABS: 40.0000 mg | ORAL_TABLET | Freq: Every day | ORAL | Status: DC

## 2014-08-19 MED ORDER — QUETIAPINE FUMARATE ER 400 MG PO TB24: 400.0000 mg | ORAL_TABLET | Freq: Every day | ORAL | Status: DC

## 2014-08-19 MED ORDER — HYDROXYZINE HCL 25 MG PO TABS: 25.0000 mg | ORAL_TABLET | Freq: Three times a day (TID) | ORAL | Status: DC | PRN

## 2014-08-19 MED ORDER — CARVEDILOL 6.25 MG PO TABS: 6.2500 mg | ORAL_TABLET | Freq: Two times a day (BID) | ORAL | Status: DC

## 2014-08-19 MED ORDER — TRAZODONE HCL 100 MG PO TABS: 100.0000 mg | ORAL_TABLET | Freq: Every evening | ORAL | Status: DC | PRN

## 2014-08-19 MED ORDER — FLUPHENAZINE HCL 10 MG PO TABS: ORAL_TABLET | ORAL | Status: DC

## 2014-08-19 MED ORDER — LAMOTRIGINE 100 MG PO TABS: 100.0000 mg | ORAL_TABLET | Freq: Every day | ORAL | Status: DC

## 2014-08-19 MED ORDER — GABAPENTIN 300 MG PO CAPS: 300.0000 mg | ORAL_CAPSULE | Freq: Three times a day (TID) | ORAL | Status: DC

## 2014-08-19 MED ORDER — BENZTROPINE MESYLATE 1 MG PO TABS: 1.0000 mg | ORAL_TABLET | Freq: Two times a day (BID) | ORAL | Status: DC

## 2014-08-19 NOTE — Progress Notes (Signed)
D:  Patient's self inventory sheet, patient slept good last night, no sleep medication given.  Good appetite, normal energy level, poor concentration.  Rated depression and hopeless 2, anxiety 4.  Denied withdrawals.  Denied SI.  Physical problems, swollen ankles, headache, toothache in past 24 hours, worst pain #8.  Goal is "trying to get out of here today.  Read the bible."  Will discuss medications with MD, kind of medication and where to purchase meds.  "Thanks for everything.  I feel 100% better." A:  Medications administered per MD orders.  Emotional support and encouragement.  Patient refused claritin this morning, stated she did not need at this time. R:  Denied SI and HI.  Denied A/V hallucinations.  Safety maintained with 15 minute checks.

## 2014-08-19 NOTE — B[facility] Suicide Risk Assessment (Signed)
[facility] Discharge Suicide Risk Assessment   Demographic Factors:  Unemployed  Total Time spent with patient: 30 minutes  Musculoskeletal: Strength & Muscle Tone: within normal limits Gait & Station: normal Patient leans: N/A  Psychiatric Specialty Exam: Physical Exam  Review of Systems  Psychiatric/Behavioral: Negative for depression, suicidal ideas, hallucinations and substance abuse. The patient is not nervous/anxious and does not have insomnia.     Blood pressure 150/94, pulse 78, temperature 98 F (36.7 C), temperature source Oral, resp. rate 18, height 5\' 3"  (1.6 m), weight 101.152 kg (223 lb), SpO2 98 %.Body mass index is 39.51 kg/(m^2).  General Appearance: Casual  Eye Contact::  Fair  Speech:  Clear and [id number]  Volume:  Normal  Mood:  Euthymic  Affect:  Congruent  Thought Process:  Coherent  Orientation:  Full (Time, Place, and Person)  Thought Content:  WDL  Suicidal Thoughts:  No  Homicidal Thoughts:  No  Memory:  Immediate;   Fair Recent;   Fair Remote;   Fair  Judgement:  Fair  Insight:  Shallow  Psychomotor Activity:  Normal  Concentration:  Fair  Recall:  [employer] [place]  Language: Fair  Akathisia:  No  Handed:  Right  AIMS (if indicated):     Assets:  Communication Skills Desire for Improvement Housing Intimacy Social Support Talents/Skills  Sleep:  Number of Hours: 5.75  Cognition: WNL  ADL's:  Intact   Have you used any form of tobacco in the last 30 days? (Cigarettes, Smokeless Tobacco, Cigars, and/or Pipes): No  Has this patient used any form of tobacco in the last 30 days? (Cigarettes, Smokeless Tobacco, Cigars, and/or Pipes) No  Mental Status Per Nursing Assessment::   On Admission:  NA  Current Mental Status by Physician: patient denies SI/HI/AH/VH  Loss Factors: Decline in physical health  Historical Factors: Impulsivity  Risk Reduction Factors:   Responsible for children under 27 years of age, Sense of  responsibility to family, Religious beliefs about death, Living with another person, especially a relative, Positive social support and Positive therapeutic relationship  Continued Clinical Symptoms:  Previous Psychiatric Diagnoses and Treatments  Cognitive Features That Contribute To Risk:  Polarized thinking    Suicide Risk:  Minimal: No identifiable suicidal ideation.  Patients presenting with no risk factors but with morbid ruminations; may be classified as minimal risk based on the severity of the depressive symptoms  Principal Problem: Bipolar disorder, current episode manic severe with psychotic features Discharge Diagnoses:   Diagnosis:  Primary Psychiatric Diagnosis: Bipolar disorder,type I manic ,severe with psychosis (IMPROVED)  Secondary Psychiatric Diagnosis: Noncompliant with medications ( IMPROVED)  Non Psychiatric Diagnosis: see PMH Patient Active Problem List   Diagnosis Date Noted  . Pedal edema [R60.0] 08/06/2014  . Lower extremity edema [R60.0] 08/06/2014  . Transaminasemia [R74.0] 08/06/2014  . Hyponatremia [E87.1] 08/06/2014  . Bipolar disorder, current episode manic severe with psychotic features [F31.2] 08/05/2014  . Positive ANA (antinuclear antibody) [R76.8] 12/19/2012  . Biological false positive RPR test [R76.8] 08/18/2012  . Persistent dry cough [R05] 05/21/2012  . SKIN RASH [R21] 12/24/2009  . MAMMOGRAM, ABNORMAL, LEFT [R92.8] 06/24/2009  . HYPERCHOLESTEROLEMIA [E78.0] 04/28/2009  . HIDRADENITIS SUPPURATIVA [L73.2] 09/06/2007  . OBESITY [E66.9] 04/17/2007  . Essential hypertension, benign [I10] 04/17/2007    Follow-up Information    Follow up with [facility]. Go on 09/15/2014.   Specialty:  Behavioral Health   Why:   Monday, 4/18, for medication management with Dr Nehemiah Massed .   Contact information:  [address] [place] [address] [phone]       Follow up with [facility].   Why:  Call from home to set up services    Contact information:   [place]  [address] Phone:  [phone] Fax:  [phone]      Follow up with [facility] On 08/27/2014.   Why:  Wednesday at 3:30PM with a primary care doctor   Contact information:   [place] [facility]/Follow-up recommendations:  Activity:  NO RESTRICTIONS Diet:  REGULAR Tests:  REPEAT K+ AS WELL AS Creatinine in 1 week, per hospitalist recommendation patient needs to follow up on these labs on a weekly basis, next labs due on 08/24/14. Other:  follow up with after care as scheduled  Is patient on multiple antipsychotic therapies at discharge:  No   Has Patient had three or more failed trials of antipsychotic monotherapy by history:  No  Recommended Plan for Multiple Antipsychotic Therapies: NA    Deion Swift MD 08/19/2014, 10:26 AM
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
## 2014-08-19 NOTE — Progress Notes (Signed)
Recreation Therapy Notes  03.21.2016 @ approximately 3:15pm LRT met with patient 1:1 to work on stress management, at request of MD. Patient participated in both tapping and deep breathing with LRT. LRT started session with tapping method of stress management, patient needed multiple prompts to continually engage in technique due to talking about other patients on that hall, as she would get distracted and stop tapping as she was talking. LRT was able to get patient to engage in one sequence of tapping and during engagement in technique patient body posture relaxed and her speech slowed. Additionally patient was able to speak clearly and concisely about topic of choice. LRT capitalized on patient focus and was able to get her to engage in deep breathing technique for 10 intentional breaths. Patient maintained relaxed body posture and slowed speech for approximately 1 minute following technique. After 1 minute patient returned to [facility] laughing at any statement she made and returned to telling LRT she was going to have her husband committed, as he is a paranoid schizophrenic.   Laureen Ochs Aadi Bordner, LRT/CTRS  Joleigh Mineau L 08/19/2014 9:09 AM

## 2014-08-19 NOTE — Progress Notes (Signed)
D: Pt continues to laugh inappropriately. However, pt required less redirection than her previous nights with Probation officer. Pt received an adequate amount of sleep on Monday night. No enuresis reported or noted by staff. Pt is currently denying any SI/HI/AVH. Pt is compliant with her qhs medication regimen. Pt ambulated the halls without the need or use of a wheelchair. Pt continues to present with swollen ankles. Naproxen given to pt for reported pain on Tuesday morning.  A: Writer administered scheduled and prn medications to pt, per MD orders. Continued support and availability as needed was extended to this pt. Staff continue to monitor pt with q63min checks.  R: No adverse drug reactions noted. Pt receptive to treatment. Pt remains safe at this time.

## 2014-08-19 NOTE — Progress Notes (Signed)
Discharge Note:  Patient discharged home with husband.  Patient denied SI and HI.  Denied A/V hallucinations.  Denied pain.  Suicide prevention information given and discussed with patient who stated she understood and had no questions.  Patient stated she received all her belongings, clothing, toiletries, misc items, prescriptions, medications, ID's, robe, sash, powder, etc.  Patient stated she appreciated all that the [facility] staff has done to help her.  Husband picked up his wife and he also stated he appreciated all that the [facility] staff has done to assist them while wife in the hospital.

## 2014-08-19 NOTE — BHH Group Notes (Signed)
The focus of this group is to educate the patient on the purpose and policies of crisis stabilization and provide a format to answer questions about their admission.  The group details unit policies and expectations of patients while admitted.  Patient attended 0900 nurse education orientation group this morning.  Patient actively participated, appropriate affect, alert, appropriate insight and engagement.  Today patient will work on 3 goals for discharge.  

## 2014-08-19 NOTE — Tx Team (Signed)
  Interdisciplinary Treatment Plan Update   Date Reviewed:  08/19/2014  Time Reviewed:  9:44 AM  Progress in Treatment:   Attending groups: Yes Participating in groups: Yes Taking medication as prescribed: Yes  Tolerating medication: Yes Family/Significant other contact made: Yes  Patient understands diagnosis: Yes  Discussing patient identified problems/goals with staff: Yes  See initial care plan Medical problems stabilized or resolved: Yes Denies suicidal/homicidal ideation: Yes  In tx team Patient has not harmed self or others: Yes  For review of initial/current patient goals, please see plan of care.  Estimated Length of Stay:  D/C today  Reason for Continuation of Hospitalization:   New Problems/Goals identified:  N/A  Discharge Plan or Barriers:   return home, follow up outpt  Additional Comments:  Attendees:  Signature: Steva Colder, MD 08/19/2014 9:44 AM   Signature: Ripley Fraise, LCSW 08/19/2014 9:44 AM  Signature: Elmarie Shiley, NP 08/19/2014 9:44 AM  Signature: Janann August, RN 08/19/2014 9:44 AM  Signature:  08/19/2014 9:44 AM  Signature:  08/19/2014 9:44 AM  Signature:   08/19/2014 9:44 AM  Signature:    Signature:    Signature:    Signature:    Signature:    Signature:      Scribe for Treatment Team:   Ripley Fraise, LCSW  08/19/2014 9:44 AM

## 2014-08-20 NOTE — Progress Notes (Signed)
  [facility] Adult Case Management Discharge Plan :  Will you be returning to the same living situation after discharge:  Yes,  home At discharge, do you have transportation home?: Yes,  husband Do you have the ability to pay for your medications: Yes,  insurance  Release of information consent forms completed and in the chart;  Patient's signature needed at discharge.  Patient to Follow up at: Follow-up Information    Follow up with [facility]. Go on 09/15/2014.   Specialty:  Behavioral Health   Why:   Monday, 4/18, for medication management with Dr Nehemiah Massed .   Contact information:   [place] [address] [phone]       Follow up with [facility].   Why:  Call from home to set up services   Contact information:   [place]  [address] Phone:  [phone] Fax:  [phone]      Follow up with [facility] On 08/27/2014.   Why:  Wednesday at 3:30PM with a primary care doctor   Contact information:   [place] [phone]      Patient denies SI/HI: Yes,  yes    Safety Planning and Suicide Prevention discussed: Yes,  yes  Have you used any form of tobacco in the last 30 days? (Cigarettes, Smokeless Tobacco, Cigars, and/or Pipes): No  Has patient been referred to the Quitline?: N/A patient is not a smoker  [place], Barbaraann Rondo B 08/20/2014, 3:13 PM

## 2014-08-21 NOTE — Progress Notes (Signed)
Patient Discharge Instructions:  After Visit Summary (AVS):   Faxed to:  08/21/14 Discharge Summary Note:   Faxed to:  08/21/14 Psychiatric Admission Assessment Note:   Faxed to:  08/21/14 Suicide Risk Assessment - Discharge Assessment:   Faxed to:  08/21/14 Faxed/Sent to the Next Level Care provider:  08/21/14 Next Level Care Provider Has Access to the EMR, 08/21/14  Faxed to [facility] @ [phone] Faxed to [facility] @ [phone] Records provided to [facility] via CH[facility]/Epic access.  Patsey Berthold, 08/21/2014, 1:36 PM

## 2014-08-27 ENCOUNTER — Encounter: Payer: Self-pay | Admitting: Internal Medicine

## 2014-08-27 ENCOUNTER — Ambulatory Visit: Payer: No Typology Code available for payment source | Attending: Internal Medicine | Admitting: Internal Medicine

## 2014-08-27 VITALS — BP 130/91 | HR 71 | Temp 98.1°F | Resp 16 | Wt 228.4 lb

## 2014-08-27 DIAGNOSIS — Z139 Encounter for screening, unspecified: Secondary | ICD-10-CM

## 2014-08-27 DIAGNOSIS — F0630 Mood disorder due to known physiological condition, unspecified: Secondary | ICD-10-CM

## 2014-08-27 DIAGNOSIS — F39 Unspecified mood [affective] disorder: Secondary | ICD-10-CM | POA: Diagnosis not present

## 2014-08-27 DIAGNOSIS — R0981 Nasal congestion: Secondary | ICD-10-CM | POA: Diagnosis not present

## 2014-08-27 DIAGNOSIS — R609 Edema, unspecified: Secondary | ICD-10-CM | POA: Insufficient documentation

## 2014-08-27 DIAGNOSIS — R600 Localized edema: Secondary | ICD-10-CM

## 2014-08-27 DIAGNOSIS — I10 Essential (primary) hypertension: Secondary | ICD-10-CM | POA: Diagnosis present

## 2014-08-27 DIAGNOSIS — Z87891 Personal history of nicotine dependence: Secondary | ICD-10-CM | POA: Insufficient documentation

## 2014-08-27 LAB — COMPLETE METABOLIC PANEL WITH GFR
ALT: 29 U/L (ref 0–35)
AST: 22 U/L (ref 0–37)
Albumin: 4.2 g/dL (ref 3.5–5.2)
Alkaline Phosphatase: 59 U/L (ref 39–117)
BUN: 9 mg/dL (ref 6–23)
CO2: 29 mEq/L (ref 19–32)
Calcium: 9.4 mg/dL (ref 8.4–10.5)
Chloride: 104 mEq/L (ref 96–112)
Creat: 0.93 mg/dL (ref 0.50–1.10)
GFR, Est African American: 82 mL/min
GFR, Est Non African American: 71 mL/min
Glucose, Bld: 96 mg/dL (ref 70–99)
Potassium: 3.7 mEq/L (ref 3.5–5.3)
Sodium: 140 mEq/L (ref 135–145)
Total Bilirubin: 0.4 mg/dL (ref 0.2–1.2)
Total Protein: 7.9 g/dL (ref 6.0–8.3)

## 2014-08-27 LAB — CBC WITH DIFFERENTIAL/PLATELET
Basophils Absolute: 0 10*3/uL (ref 0.0–0.1)
Basophils Relative: 1 % (ref 0–1)
Eosinophils Absolute: 0.3 10*3/uL (ref 0.0–0.7)
Eosinophils Relative: 8 % — ABNORMAL HIGH (ref 0–5)
HCT: 38.4 % (ref 36.0–46.0)
Hemoglobin: 13 g/dL (ref 12.0–15.0)
Lymphocytes Relative: 32 % (ref 12–46)
Lymphs Abs: 1.2 10*3/uL (ref 0.7–4.0)
MCH: 30.7 pg (ref 26.0–34.0)
MCHC: 33.9 g/dL (ref 30.0–36.0)
MCV: 90.6 fL (ref 78.0–100.0)
MPV: 9.3 fL (ref 8.6–12.4)
Monocytes Absolute: 0.8 10*3/uL (ref 0.1–1.0)
Monocytes Relative: 21 % — ABNORMAL HIGH (ref 3–12)
Neutro Abs: 1.4 10*3/uL — ABNORMAL LOW (ref 1.7–7.7)
Neutrophils Relative %: 38 % — ABNORMAL LOW (ref 43–77)
Platelets: 314 10*3/uL (ref 150–400)
RBC: 4.24 MIL/uL (ref 3.87–5.11)
RDW: 13.8 % (ref 11.5–15.5)
WBC: 3.8 10*3/uL — ABNORMAL LOW (ref 4.0–10.5)

## 2014-08-27 LAB — TSH: TSH: 0.449 u[IU]/mL (ref 0.350–4.500)

## 2014-08-27 MED ORDER — CARVEDILOL 6.25 MG PO TABS: 6.2500 mg | ORAL_TABLET | Freq: Two times a day (BID) | ORAL | Status: DC

## 2014-08-27 MED ORDER — FUROSEMIDE 40 MG PO TABS: 40.0000 mg | ORAL_TABLET | Freq: Every day | ORAL | Status: DC

## 2014-08-27 MED ORDER — FLUTICASONE PROPIONATE 50 MCG/ACT NA SUSP: 2.0000 | Freq: Every day | NASAL | Status: DC
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
## 2014-08-27 NOTE — Progress Notes (Signed)
Patient here to establish care Has a history of hypertension and bipolar Recently spent time in the hospital for a manic episode of her bipolar Patient was discharged on 3/22

## 2014-08-27 NOTE — Patient Instructions (Signed)
DASH Eating Plan °DASH stands for "Dietary Approaches to Stop Hypertension." The DASH eating plan is a healthy eating plan that has been shown to reduce high blood pressure (hypertension). Additional health benefits may include reducing the risk of type 2 diabetes mellitus, heart disease, and stroke. The DASH eating plan may also help with weight loss. °WHAT DO I NEED TO KNOW ABOUT THE DASH EATING PLAN? °For the DASH eating plan, you will follow these general guidelines: °· Choose foods with a percent daily value for sodium of less than 5% (as listed on the food label). °· Use salt-free seasonings or herbs instead of table salt or sea salt. °· Check with your health care provider or pharmacist before using salt substitutes. °· Eat lower-sodium products, often labeled as "lower sodium" or "no salt added." °· Eat fresh foods. °· Eat more vegetables, fruits, and low-fat dairy products. °· Choose whole grains. Look for the word "whole" as the first word in the ingredient list. °· Choose fish and skinless chicken or turkey more often than red meat. Limit fish, poultry, and meat to 6 oz (170 g) each day. °· Limit sweets, desserts, sugars, and sugary drinks. °· Choose heart-healthy fats. °· Limit cheese to 1 oz (28 g) per day. °· Eat more home-cooked food and less restaurant, buffet, and fast food. °· Limit fried foods. °· Cook foods using methods other than frying. °· Limit canned vegetables. If you do use them, rinse them well to decrease the sodium. °· When eating at a restaurant, ask that your food be prepared with less salt, or no salt if possible. °WHAT FOODS CAN I EAT? °Seek help from a dietitian for individual calorie needs. °Grains °Whole grain or whole wheat bread. Brown rice. Whole grain or whole wheat pasta. Quinoa, bulgur, and whole grain cereals. Low-sodium cereals. Corn or whole wheat flour tortillas. Whole grain cornbread. Whole grain crackers. Low-sodium crackers. °Vegetables °Fresh or frozen vegetables  (raw, steamed, roasted, or grilled). Low-sodium or reduced-sodium tomato and vegetable juices. Low-sodium or reduced-sodium tomato sauce and paste. Low-sodium or reduced-sodium canned vegetables.  °Fruits °All fresh, canned (in natural juice), or frozen fruits. °Meat and Other Protein Products °Ground beef (85% or leaner), grass-fed beef, or beef trimmed of fat. Skinless chicken or turkey. Ground chicken or turkey. Pork trimmed of fat. All fish and seafood. Eggs. Dried beans, peas, or lentils. Unsalted nuts and seeds. Unsalted canned beans. °Dairy °Low-fat dairy products, such as skim or 1% milk, 2% or reduced-fat cheeses, low-fat ricotta or cottage cheese, or plain low-fat yogurt. Low-sodium or reduced-sodium cheeses. °Fats and Oils °Tub margarines without trans fats. Light or reduced-fat mayonnaise and salad dressings (reduced sodium). Avocado. Safflower, olive, or canola oils. Natural peanut or almond butter. °Other °Unsalted popcorn and pretzels. °The items listed above may not be a complete list of recommended foods or beverages. Contact your dietitian for more options. °WHAT FOODS ARE NOT RECOMMENDED? °Grains °White bread. White pasta. White rice. Refined cornbread. Bagels and croissants. Crackers that contain trans fat. °Vegetables °Creamed or fried vegetables. Vegetables in a cheese sauce. Regular canned vegetables. Regular canned tomato sauce and paste. Regular tomato and vegetable juices. °Fruits °Dried fruits. Canned fruit in light or heavy syrup. Fruit juice. °Meat and Other Protein Products °Fatty cuts of meat. Ribs, chicken wings, bacon, sausage, bologna, salami, chitterlings, fatback, hot dogs, bratwurst, and packaged luncheon meats. Salted nuts and seeds. Canned beans with salt. °Dairy °Whole or 2% milk, cream, half-and-half, and cream cheese. Whole-fat or sweetened yogurt. Full-fat   cheeses or blue cheese. Nondairy creamers and whipped toppings. Processed cheese, cheese spreads, or cheese  curds. °Condiments °Onion and garlic salt, seasoned salt, table salt, and sea salt. Canned and packaged gravies. Worcestershire sauce. Tartar sauce. Barbecue sauce. Teriyaki sauce. Soy sauce, including reduced sodium. Steak sauce. Fish sauce. Oyster sauce. Cocktail sauce. Horseradish. Ketchup and mustard. Meat flavorings and tenderizers. Bouillon cubes. Hot sauce. Tabasco sauce. Marinades. Taco seasonings. Relishes. °Fats and Oils °Butter, stick margarine, lard, shortening, ghee, and bacon fat. Coconut, palm kernel, or palm oils. Regular salad dressings. °Other °Pickles and olives. Salted popcorn and pretzels. °The items listed above may not be a complete list of foods and beverages to avoid. Contact your dietitian for more information. °WHERE CAN I FIND MORE INFORMATION? °National Heart, Lung, and [employer] Institute: www.nhlbi.nih.gov/health/health-topics/topics/dash/ °Document Released: 05/05/2011 Document Revised: 09/30/2013 Document Reviewed: 03/20/2013 °ExitCare® Patient Information ©2015 [employer]. This information is not intended to replace advice given to you by your health care provider. Make sure you discuss any questions you have with your health care provider. ° °

## 2014-08-28 LAB — HEMOGLOBIN A1C
Hgb A1c MFr Bld: 5.7 % — ABNORMAL HIGH (ref <5.7)
Mean Plasma Glucose: 117 mg/dL — ABNORMAL HIGH (ref <117)

## 2014-08-28 LAB — VITAMIN D 25 HYDROXY (VIT D DEFICIENCY, FRACTURES): Vit D, 25-Hydroxy: 13 ng/mL — ABNORMAL LOW (ref 30–100)

## 2014-08-29 ENCOUNTER — Ambulatory Visit: Payer: No Typology Code available for payment source | Admitting: Family Medicine

## 2014-09-04 ENCOUNTER — Ambulatory Visit: Payer: No Typology Code available for payment source | Admitting: Family Medicine

## 2014-09-05 ENCOUNTER — Telehealth: Payer: Self-pay

## 2014-09-05 MED ORDER — VITAMIN D (ERGOCALCIFEROL) 1.25 MG (50000 UNIT) PO CAPS: 50000.0000 [IU] | ORAL_CAPSULE | ORAL | Status: DC

## 2014-09-05 NOTE — Telephone Encounter (Signed)
Patient not available Left message on voice mail to return our call 

## 2014-09-05 NOTE — Telephone Encounter (Signed)
-----   Message from Lorayne Marek, MD sent at 08/28/2014 11:21 AM EDT ----- Blood work reviewed, noticed low vitamin D, call patient advise to start ergocalciferol 50,000 units once a week for the duration of  12 weeks. noticed hemoglobin A1c of  5.7 %, patient has prediabetes, call and advise patient for low carbohydrate diet. Also noted  borderline low WBC count, will repeat her CBC on the following visit

## 2014-09-10 ENCOUNTER — Ambulatory Visit: Payer: Self-pay | Admitting: Internal Medicine

## 2014-09-24 ENCOUNTER — Ambulatory Visit: Payer: Self-pay

## 2015-04-08 ENCOUNTER — Encounter: Payer: Self-pay | Admitting: Family Medicine

## 2015-04-08 ENCOUNTER — Ambulatory Visit (INDEPENDENT_AMBULATORY_CARE_PROVIDER_SITE_OTHER): Payer: No Typology Code available for payment source | Admitting: Family Medicine

## 2015-04-08 VITALS — BP 135/77 | HR 77 | Temp 98.3°F | Resp 16 | Ht 64.0 in | Wt 223.0 lb

## 2015-04-08 DIAGNOSIS — B3731 Acute candidiasis of vulva and vagina: Secondary | ICD-10-CM | POA: Insufficient documentation

## 2015-04-08 DIAGNOSIS — N898 Other specified noninflammatory disorders of vagina: Secondary | ICD-10-CM

## 2015-04-08 DIAGNOSIS — Z1239 Encounter for other screening for malignant neoplasm of breast: Secondary | ICD-10-CM

## 2015-04-08 DIAGNOSIS — B373 Candidiasis of vulva and vagina: Secondary | ICD-10-CM

## 2015-04-08 DIAGNOSIS — R21 Rash and other nonspecific skin eruption: Secondary | ICD-10-CM

## 2015-04-08 DIAGNOSIS — L298 Other pruritus: Secondary | ICD-10-CM

## 2015-04-08 DIAGNOSIS — Z872 Personal history of diseases of the skin and subcutaneous tissue: Secondary | ICD-10-CM | POA: Insufficient documentation

## 2015-04-08 DIAGNOSIS — L309 Dermatitis, unspecified: Secondary | ICD-10-CM | POA: Insufficient documentation

## 2015-04-08 LAB — POCT WET + KOH PREP: Trich by wet prep: ABSENT

## 2015-04-08 MED ORDER — DEXAMETHASONE SODIUM PHOSPHATE 4 MG/ML IJ SOLN: 4.0000 mg | Freq: Once | INTRAMUSCULAR | Status: DC

## 2015-04-08 MED ORDER — TRIAMCINOLONE ACETONIDE 40 MG/ML IJ SUSP
40.0000 mg | Freq: Once | INTRAMUSCULAR | Status: AC
  Administered 2015-04-08: 40 mg via INTRAMUSCULAR

## 2015-04-08 MED ORDER — DEXAMETHASONE SODIUM PHOSPHATE 4 MG/ML IJ SOLN
4.0000 mg | Freq: Once | INTRAMUSCULAR | Status: AC
  Administered 2015-04-08: 4 mg via INTRAMUSCULAR

## 2015-04-08 MED ORDER — FLUCONAZOLE 150 MG PO TABS: 150.0000 mg | ORAL_TABLET | Freq: Once | ORAL | Status: DC

## 2015-04-08 MED ORDER — TRIAMCINOLONE ACETONIDE 0.1 % EX CREA: 1.0000 "application " | TOPICAL_CREAM | Freq: Two times a day (BID) | CUTANEOUS | Status: DC
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
## 2015-04-08 NOTE — Patient Instructions (Addendum)
Received Kenalog and decadron injection for generalized rash Recommend that patient start using hypoallergenic bodywash, laundry detergent, and lotion.  Recommend Claritin daily to assist with pruritis, given samples  Also sent Rx for triamcinolone lotion to utilize twice daily. Contact Dermatitis Dermatitis is redness, soreness, and swelling (inflammation) of the skin. Contact dermatitis is a reaction to certain substances that touch the skin. There are two types of contact dermatitis:   Irritant contact dermatitis. This type is caused by something that irritates your skin, such as dry hands from washing them too much. This type does not require previous exposure to the substance for a reaction to occur. This type is more common.  Allergic contact dermatitis. This type is caused by a substance that you are allergic to, such as a nickel allergy or poison ivy. This type only occurs if you have been exposed to the substance (allergen) before. Upon a repeat exposure, your body reacts to the substance. This type is less common. CAUSES  Many different substances can cause contact dermatitis. Irritant contact dermatitis is most commonly caused by exposure to:   Makeup.   Soaps.   Detergents.   Bleaches.   Acids.   Metal salts, such as nickel.  Allergic contact dermatitis is most commonly caused by exposure to:   Poisonous plants.   Chemicals.   Jewelry.   Latex.   Medicines.   Preservatives in products, such as clothing.  RISK FACTORS This condition is more likely to develop in:   People who have jobs that expose them to irritants or allergens.  People who have certain medical conditions, such as asthma or eczema.  SYMPTOMS  Symptoms of this condition may occur anywhere on your body where the irritant has touched you or is touched by you. Symptoms include:  Dryness or flaking.   Redness.   Cracks.   Itching.   Pain or a burning feeling.    Blisters.  Drainage of small amounts of blood or clear fluid from skin cracks. With allergic contact dermatitis, there may also be swelling in areas such as the eyelids, mouth, or genitals.  DIAGNOSIS  This condition is diagnosed with a medical history and physical exam. A patch skin test may be performed to help determine the cause. If the condition is related to your job, you may need to see an occupational medicine specialist. TREATMENT Treatment for this condition includes figuring out what caused the reaction and protecting your skin from further contact. Treatment may also include:   Steroid creams or ointments. Oral steroid medicines may be needed in more severe cases.  Antibiotics or antibacterial ointments, if a skin infection is present.  Antihistamine lotion or an antihistamine taken by mouth to ease itching.  A bandage (dressing). HOME CARE INSTRUCTIONS Skin Care  Moisturize your skin as needed.   Apply cool compresses to the affected areas.  Try taking a bath with:  Epsom salts. Follow the instructions on the packaging. You can get these at your local pharmacy or grocery store.  Baking soda. Pour a small amount into the bath as directed by your health care provider.  Colloidal oatmeal. Follow the instructions on the packaging. You can get this at your local pharmacy or grocery store.  Try applying baking soda paste to your skin. Stir water into baking soda until it reaches a paste-like consistency.  Do not scratch your skin.  Bathe less frequently, such as every other day.  Bathe in lukewarm water. Avoid using hot water. Medicines  Take  or apply over-the-counter and prescription medicines only as told by your health care provider.   If you were prescribed an antibiotic medicine, take or apply your antibiotic as told by your health care provider. Do not stop using the antibiotic even if your condition starts to improve. General Instructions  Keep all  follow-up visits as told by your health care provider. This is important.  Avoid the substance that caused your reaction. If you do not know what caused it, keep a journal to try to track what caused it. Write down:  What you eat.  What cosmetic products you use.  What you drink.  What you wear in the affected area. This includes jewelry.  If you were given a dressing, take care of it as told by your health care provider. This includes when to change and remove it. SEEK MEDICAL CARE IF:   Your condition does not improve with treatment.  Your condition gets worse.  You have signs of infection such as swelling, tenderness, redness, soreness, or warmth in the affected area.  You have a fever.  You have new symptoms. SEEK IMMEDIATE MEDICAL CARE IF:   You have a severe headache, neck pain, or neck stiffness.  You vomit.  You feel very sleepy.  You notice red streaks coming from the affected area.  Your bone or joint underneath the affected area becomes painful after the skin has healed.  The affected area turns darker.  You have difficulty breathing.   This information is not intended to replace advice given to you by your health care provider. Make sure you discuss any questions you have with your health care provider.   Document Released: 05/13/2000 Document Revised: 02/04/2015 Document Reviewed: 10/01/2014 Elsevier Interactive Patient Education [employer].

## 2015-05-06 ENCOUNTER — Telehealth: Payer: Self-pay | Admitting: Family Medicine

## 2015-05-06 NOTE — Telephone Encounter (Signed)
Received request from [employer]ability [employer] for medical records. Forwarded to [facility].  °

## 2015-06-08 ENCOUNTER — Ambulatory Visit: Payer: Self-pay | Admitting: Family Medicine

## 2015-07-06 HISTORY — PX: COLOSTOMY: SHX63

## 2015-07-15 ENCOUNTER — Other Ambulatory Visit (HOSPITAL_COMMUNITY)
Admission: RE | Admit: 2015-07-15 | Discharge: 2015-07-15 | Disposition: A | Discharge status: Home / Self Care | Payer: No Typology Code available for payment source | Source: Ambulatory Visit | Attending: Internal Medicine | Admitting: Internal Medicine

## 2015-07-15 ENCOUNTER — Ambulatory Visit (INDEPENDENT_AMBULATORY_CARE_PROVIDER_SITE_OTHER): Payer: No Typology Code available for payment source | Admitting: Family Medicine

## 2015-07-15 VITALS — BP 130/78 | HR 64 | Temp 98.1°F | Resp 16 | Ht 64.0 in | Wt 220.0 lb

## 2015-07-15 DIAGNOSIS — Z202 Contact with and (suspected) exposure to infections with a predominantly sexual mode of transmission: Secondary | ICD-10-CM

## 2015-07-15 DIAGNOSIS — Z1239 Encounter for other screening for malignant neoplasm of breast: Secondary | ICD-10-CM

## 2015-07-15 DIAGNOSIS — E559 Vitamin D deficiency, unspecified: Secondary | ICD-10-CM | POA: Insufficient documentation

## 2015-07-15 DIAGNOSIS — Z01419 Encounter for gynecological examination (general) (routine) without abnormal findings: Secondary | ICD-10-CM | POA: Insufficient documentation

## 2015-07-15 DIAGNOSIS — F32A Depression, unspecified: Secondary | ICD-10-CM

## 2015-07-15 DIAGNOSIS — Z23 Encounter for immunization: Secondary | ICD-10-CM

## 2015-07-15 DIAGNOSIS — I10 Essential (primary) hypertension: Secondary | ICD-10-CM

## 2015-07-15 DIAGNOSIS — F329 Major depressive disorder, single episode, unspecified: Secondary | ICD-10-CM

## 2015-07-15 LAB — CBC WITH DIFFERENTIAL/PLATELET
Basophils Absolute: 0 10*3/uL (ref 0.0–0.1)
Basophils Relative: 0 % (ref 0–1)
Eosinophils Absolute: 0.3 10*3/uL (ref 0.0–0.7)
Eosinophils Relative: 4 % (ref 0–5)
HCT: 43 % (ref 36.0–46.0)
Hemoglobin: 14.6 g/dL (ref 12.0–15.0)
Lymphocytes Relative: 32 % (ref 12–46)
Lymphs Abs: 2 10*3/uL (ref 0.7–4.0)
MCH: 31.1 pg (ref 26.0–34.0)
MCHC: 34 g/dL (ref 30.0–36.0)
MCV: 91.5 fL (ref 78.0–100.0)
MPV: 9.4 fL (ref 8.6–12.4)
Monocytes Absolute: 0.4 10*3/uL (ref 0.1–1.0)
Monocytes Relative: 7 % (ref 3–12)
Neutro Abs: 3.6 10*3/uL (ref 1.7–7.7)
Neutrophils Relative %: 57 % (ref 43–77)
Platelets: 371 10*3/uL (ref 150–400)
RBC: 4.7 MIL/uL (ref 3.87–5.11)
RDW: 14.6 % (ref 11.5–15.5)
WBC: 6.3 10*3/uL (ref 4.0–10.5)

## 2015-07-15 LAB — COMPLETE METABOLIC PANEL WITH GFR
ALT: 16 U/L (ref 6–29)
AST: 17 U/L (ref 10–35)
Albumin: 4 g/dL (ref 3.6–5.1)
Alkaline Phosphatase: 51 U/L (ref 33–130)
BUN: 12 mg/dL (ref 7–25)
CO2: 27 mmol/L (ref 20–31)
Calcium: 10.1 mg/dL (ref 8.6–10.4)
Chloride: 101 mmol/L (ref 98–110)
Creat: 0.96 mg/dL (ref 0.50–1.05)
GFR, Est African American: 78 mL/min (ref >=60)
GFR, Est Non African American: 68 mL/min (ref >=60)
Glucose, Bld: 89 mg/dL (ref 65–99)
Potassium: 4.5 mmol/L (ref 3.5–5.3)
Sodium: 138 mmol/L (ref 135–146)
Total Bilirubin: 0.4 mg/dL (ref 0.2–1.2)
Total Protein: 8 g/dL (ref 6.1–8.1)

## 2015-07-15 LAB — POCT URINALYSIS DIP (DEVICE)
Glucose, UA: NEGATIVE mg/dL
Hgb urine dipstick: NEGATIVE
Leukocytes, UA: NEGATIVE
Nitrite: NEGATIVE
Protein, ur: 30 mg/dL — AB
Specific Gravity, Urine: >=1.03 (ref 1.005–1.030)
Urobilinogen, UA: 0.2 mg/dL (ref 0.0–1.0)
pH: 5.5 (ref 5.0–8.0)

## 2015-07-15 NOTE — Progress Notes (Signed)
Subjective:  Stacie Daugherty is a 55 y.o. female who presents for a follow up of hypertension and a routine health maintenance exam.    She has had elevated blood pressure for several  years. Her blood pressurebeen controlled at home, today her BP is BP: 130/78 mmHg She does not workout. She denies chest pain, shortness of breath, dizziness.  She is not on cholesterol medication and denies myalgias. Her cholesterol is not at goal. The cholesterol last visit was:   Lab Results  Component Value Date   CHOL 215* 09/11/2012   HDL 67 09/11/2012   LDLCALC 127* 09/11/2012   TRIG 104 09/11/2012   CHOLHDL 3.2 09/11/2012   Lab Results  Component Value Date   HGBA1C 5.7* 08/27/2014   Patient is on Vitamin D supplement.   Lab Results  Component Value Date   VD25OH 13* 08/27/2014       Names of Other Physician/Practitioners you currently use:  Patient Care Team: [name], FNP as PCP - General (Family Medicine)   Medication Review: Current Outpatient Prescriptions on File Prior to Visit  Medication Sig Dispense Refill  . carvedilol (COREG) 6.25 MG tablet Take 1 tablet (6.25 mg total) by mouth 2 (two) times daily with a meal. For high blood pressure 60 tablet 3  . docusate sodium (COLACE) 100 MG capsule Take 1 capsule (100 mg total) by mouth 2 (two) times daily. (May purchase from over the counter at yr local pharmacy): For constipation 10 capsule 0  . gabapentin (NEURONTIN) 300 MG capsule Take 1 capsule (300 mg total) by mouth 3 (three) times daily. For agitation 90 capsule 0  . QUEtiapine (SEROQUEL XR) 400 MG 24 hr tablet Take 1 tablet (400 mg total) by mouth daily at 8 pm. For mood control 30 tablet 0  . sulfamethoxazole-trimethoprim (BACTRIM DS,SEPTRA DS) 800-160 MG tablet Take 1 tablet by mouth 2 (two) times daily.    . traZODone (DESYREL) 100 MG tablet Take 1 tablet (100 mg total) by mouth at bedtime as needed for sleep. (Patient taking differently: Take 1 tablet (100 mg  total) by mouth at bedtime as needed for sleep.) 30 tablet 0  . triamcinolone cream (KENALOG) 0.1 % Apply 1 application topically 2 (two) times daily. 45 g 0  . acetaminophen (TYLENOL) 500 MG tablet Take 1 tablet (500 mg total) by mouth every 6 (six) hours as needed for moderate pain or headache. (Patient not taking: Reported on 07/15/2015) 30 tablet 0  . furosemide (LASIX) 40 MG tablet Take 1 tablet (40 mg total) by mouth daily. For swellings (Patient not taking: Reported on 07/15/2015) 90 tablet 1  . Vitamin D, Ergocalciferol, (DRISDOL) 50000 UNITS CAPS capsule Take 1 capsule (50,000 Units total) by mouth every 7 (seven) days. (Patient not taking: Reported on 04/08/2015) 12 capsule 0   No current facility-administered medications on file prior to visit.    Current Problems (verified) Patient Active Problem List   Diagnosis Date Noted  . Generalized rash 04/08/2015  . Vaginal yeast infection 04/08/2015  . Vaginal itching 04/08/2015  . History of eczema 04/08/2015  . Dermatitis 04/08/2015  . Pedal edema 08/06/2014  . Lower extremity edema 08/06/2014  . Transaminasemia 08/06/2014  . Hyponatremia 08/06/2014  . Bipolar disorder, current episode manic severe with psychotic features ([place]) 08/05/2014  . Positive ANA (antinuclear antibody) 12/19/2012  . Biological false positive RPR test 08/18/2012  . Persistent dry cough 05/21/2012  . SKIN RASH 12/24/2009  . MAMMOGRAM, ABNORMAL, LEFT 06/24/2009  .  HYPERCHOLESTEROLEMIA 04/28/2009  . HIDRADENITIS SUPPURATIVA 09/06/2007  . OBESITY 04/17/2007  . Essential hypertension, benign 04/17/2007    Screening Tests Health Maintenance  Topic Date Due  . TETANUS/TDAP  12/09/1980  . COLONOSCOPY  12/10/2011  . INFLUENZA VACCINE  12/29/2014  . MAMMOGRAM  02/15/2016  . PAP SMEAR  08/07/2016  . Hepatitis C Screening  Completed  . HIV Screening  Completed    Immunization History  Administered Date(s) Administered  . Influenza,inj,Quad PF,36+ Mos  05/06/2013  . PPD Test 10/26/2012, 12/18/2012    Preventative care: Last mammogram: 2 years ago Last pap smear/pelvic exam: 3 years ago     Prior vaccinations: TD or Tdap: Greater than 2 years ago  I   Medication List       This list is accurate as of: 07/15/15 11:00 AM.  Always use your most recent med list.               acetaminophen 500 MG tablet  Commonly known as:  TYLENOL  Take 1 tablet (500 mg total) by mouth every 6 (six) hours as needed for moderate pain or headache.     carvedilol 6.25 MG tablet  Commonly known as:  COREG  Take 1 tablet (6.25 mg total) by mouth 2 (two) times daily with a meal. For high blood pressure     clonazePAM 0.5 MG tablet  Commonly known as:  KLONOPIN  Take 0.5 mg by mouth at bedtime.     docusate sodium 100 MG capsule  Commonly known as:  COLACE  Take 1 capsule (100 mg total) by mouth 2 (two) times daily. (May purchase from over the counter at yr local pharmacy): For constipation     furosemide 40 MG tablet  Commonly known as:  LASIX  Take 1 tablet (40 mg total) by mouth daily. For swellings     gabapentin 300 MG capsule  Commonly known as:  NEURONTIN  Take 1 capsule (300 mg total) by mouth 3 (three) times daily. For agitation     QUEtiapine 400 MG 24 hr tablet  Commonly known as:  SEROQUEL XR  Take 1 tablet (400 mg total) by mouth daily at 8 pm. For mood control     sertraline 50 MG tablet  Commonly known as:  ZOLOFT  Take 50 mg by mouth daily.     sulfamethoxazole-trimethoprim 800-160 MG tablet  Commonly known as:  BACTRIM DS,SEPTRA DS  Take 1 tablet by mouth 2 (two) times daily.     traZODone 100 MG tablet  Commonly known as:  DESYREL  Take 1 tablet (100 mg total) by mouth at bedtime as needed for sleep.     triamcinolone cream 0.1 %  Commonly known as:  KENALOG  Apply 1 application topically 2 (two) times daily.     Vitamin D (Ergocalciferol) 50000 units Caps capsule  Commonly known as:  DRISDOL  Take 1 capsule  (50,000 Units total) by mouth every 7 (seven) days.        Past Surgical History  Procedure Laterality Date  . Induced abortion      in patient's 76s in [place], [place].   Family History  Problem Relation Age of Onset  . Depression Mother   . Pulmonary embolism Mother   . Bipolar disorder Mother   . Cancer Father     ?prostate cancer  . Hypertension Maternal Grandfather   . Hypertension Paternal Grandmother     History reviewed: allergies, current medications, past family history, past medical history,  past social history, past surgical history and problem list   Risk Factors: Osteoporosis/FallRisk: She has not fallen in the past year, but has a history of a decreased vitamin D level.  ITobacco Social History  Substance Use Topics  . Smoking status: Former Smoker    Types: Cigarettes  . Smokeless tobacco: Not on file     Comment: Smoked few cigarettes, socially, "did not inhale"  . Alcohol Use: No     Comment: Social alcohol use when younger   She does not smoke.  Patient is a former smoker. Alcohol Current alcohol use: none   Depression Screen Reviewed PHQ-9, patient is currently following by [facility] for major depressive disorder. She reports periodic anhedonia. She currently denies suicidal or homicidal intent.     Objective:     Blood pressure 130/78, pulse 64, temperature 98.1 F (36.7 C), temperature source Oral, resp. rate 16, height 5\' 4"  (1.626 m), weight 220 lb (99.791 kg). Body mass index is 37.74 kg/(m^2). HEENT: normocephalic, sclerae anicteric, TMs pearly, nares patent, no discharge or erythema, pharynx normal Oral cavity: MMM, no lesions Neck: supple, no lymphadenopathy, no thyromegaly, no masses Heart: RRR, normal S1, S2, no murmurs Lungs: CTA bilaterally, no wheezes, rhonchi, or rales Abdomen: +bs, soft, non tender, non distended, no masses, no hepatomegaly, no splenomegaly Musculoskeletal: nontender, no swelling, no obvious  deformity Extremities: no edema, no cyanosis, no clubbing Pulses: 2+ symmetric, upper and lower extremities, normal cap refill Neurological: alert, oriented x 3, CN2-12 intact, strength normal upper extremities and lower extremities, sensation normal throughout, DTRs 2+ throughout, no cerebellar signs, gait normal Vaginal: External genitalia normal, minimal thin, white vaginal discharge, No CMTpresent Psychiatric: normal affect, behavior normal, pleasant   Assessment:  Patient denies any difficulties at home. No trouble with ADLs or recent falls. No recent changes to vision or hearing. Is UTD with immunizations. Is UTD with screenings. Discussed Advanced Directives discussed, she does not have a living will or a healthcare POA  Encouraged heart healthy diet, exercise as tolerated and adequate sleep.  Declines flu shot.  Pap smear done today.     Plan:   During the course of the visit the patient was educated and counseled about appropriate screening and preventive services including:    Pneumococcal vaccine   Influenza vaccine  Td vaccine  Screening electrocardiogram  Bone densitometry screening  Colorectal cancer screening  Diabetes screening  Nutrition counseling   Advanced directives: requested  Screening recommendations, referrals: Mammogram Hemoccult cards given, she is to return to office as instructed  Conditions/risks identified: BMI: Discussed weight loss, diet, and increase physical activity.  Increase physical activity: AHA recommends 150 minutes of physical activity a week.   1. Encounter for routine gynecological examination  - Cytology - PAP [facility]  2. Essential hypertension, benign Blood pressure is at goal on current medication regimen - POCT urinalysis dipstick - COMPLETE METABOLIC PANEL WITH GFR - CBC with Differential  3. Vitamin D deficiency - Vitamin D, 25-hydroxy  4. Need for Tdap vaccination - Tdap vaccine greater than or equal  to 7yo IM  5. Possible exposure to STD - RPR - HIV antibody (with reflex) - GC/Chlamydia Probe Amp   RTC: Will follow up by phone to discuss laboratory results. Will also follow up in 6 months for hypertension.    Buzz Axel Jerilynn Mages, FNP   07/15/2015

## 2015-07-16 ENCOUNTER — Other Ambulatory Visit: Payer: Self-pay | Admitting: Family Medicine

## 2015-07-16 ENCOUNTER — Encounter: Payer: Self-pay | Admitting: Family Medicine

## 2015-07-16 LAB — RPR

## 2015-07-16 LAB — CYTOLOGY - PAP

## 2015-07-16 LAB — VITAMIN D 25 HYDROXY (VIT D DEFICIENCY, FRACTURES): Vit D, 25-Hydroxy: 20 ng/mL — ABNORMAL LOW (ref 30–100)

## 2015-07-16 LAB — HIV ANTIBODY (ROUTINE TESTING W REFLEX): HIV 1&2 Ab, 4th Generation: NONREACTIVE

## 2015-07-16 LAB — GC/CHLAMYDIA PROBE AMP
CT Probe RNA: NOT DETECTED
GC Probe RNA: NOT DETECTED

## 2015-07-31 ENCOUNTER — Other Ambulatory Visit: Payer: Self-pay

## 2015-07-31 DIAGNOSIS — Z1231 Encounter for screening mammogram for malignant neoplasm of breast: Secondary | ICD-10-CM

## 2015-08-04 ENCOUNTER — Encounter: Payer: Self-pay | Admitting: Family Medicine

## 2015-08-04 ENCOUNTER — Ambulatory Visit (INDEPENDENT_AMBULATORY_CARE_PROVIDER_SITE_OTHER): Payer: No Typology Code available for payment source | Admitting: Family Medicine

## 2015-08-04 VITALS — BP 148/91 | HR 64 | Temp 98.2°F | Resp 16 | Ht 63.0 in | Wt 222.0 lb

## 2015-08-04 DIAGNOSIS — I10 Essential (primary) hypertension: Secondary | ICD-10-CM

## 2015-08-04 DIAGNOSIS — L304 Erythema intertrigo: Secondary | ICD-10-CM

## 2015-08-04 MED ORDER — NYSTATIN POWD: 1.0000 "application " | Freq: Two times a day (BID) | Status: DC

## 2015-08-04 MED ORDER — AMLODIPINE BESYLATE 5 MG PO TABS: 5.0000 mg | ORAL_TABLET | Freq: Every day | ORAL | Status: DC

## 2015-08-04 NOTE — Patient Instructions (Addendum)
Start Amlodipine 5 mg in am Continue Carvedilol- decrease dose by cutting tablet in 1/2 for 2 weeks until titrated down.  Return in 1 week for a bp check with [name] Return in 1 month for a f/u for hypertension   DASH Eating Plan DASH stands for "Dietary Approaches to Stop Hypertension." The DASH eating plan is a healthy eating plan that has been shown to reduce high blood pressure (hypertension). Additional health benefits may include reducing the risk of type 2 diabetes mellitus, heart disease, and stroke. The DASH eating plan may also help with weight loss. WHAT DO I NEED TO KNOW ABOUT THE DASH EATING PLAN? For the DASH eating plan, you will follow these general guidelines:  Choose foods with a percent daily value for sodium of less than 5% (as listed on the food label).  Use salt-free seasonings or herbs instead of table salt or sea salt.  Check with your health care provider or pharmacist before using salt substitutes.  Eat lower-sodium products, often labeled as "lower sodium" or "no salt added."  Eat fresh foods.  Eat more vegetables, fruits, and low-fat dairy products.  Choose whole grains. Look for the word "whole" as the first word in the ingredient list.  Choose fish and skinless chicken or [place] more often than red meat. Limit fish, poultry, and meat to 6 oz (170 g) each day.  Limit sweets, desserts, sugars, and sugary drinks.  Choose heart-healthy fats.  Limit cheese to 1 oz (28 g) per day.  Eat more home-cooked food and less restaurant, buffet, and fast food.  Limit fried foods.  Cook foods using methods other than frying.  Limit canned vegetables. If you do use them, rinse them well to decrease the sodium.  When eating at a restaurant, ask that your food be prepared with less salt, or no salt if possible. WHAT FOODS CAN I EAT? Seek help from a dietitian for individual calorie needs. Grains Whole grain or whole wheat bread. Brown rice. Whole grain or whole  wheat pasta. Quinoa, bulgur, and whole grain cereals. Low-sodium cereals. Corn or whole wheat flour tortillas. Whole grain cornbread. Whole grain crackers. Low-sodium crackers. Vegetables Fresh or frozen vegetables (raw, steamed, roasted, or grilled). Low-sodium or reduced-sodium tomato and vegetable juices. Low-sodium or reduced-sodium tomato sauce and paste. Low-sodium or reduced-sodium canned vegetables.  Fruits All fresh, canned (in natural juice), or frozen fruits. Meat and Other Protein Products Ground beef (85% or leaner), grass-fed beef, or beef trimmed of fat. Skinless chicken or [place]. Ground chicken or [place]. Pork trimmed of fat. All fish and seafood. Eggs. Dried beans, peas, or lentils. Unsalted nuts and seeds. Unsalted canned beans. Dairy Low-fat dairy products, such as skim or 1% milk, 2% or reduced-fat cheeses, low-fat ricotta or cottage cheese, or plain low-fat yogurt. Low-sodium or reduced-sodium cheeses. Fats and Oils Tub margarines without trans fats. Light or reduced-fat mayonnaise and salad dressings (reduced sodium). Avocado. Safflower, olive, or canola oils. Natural peanut or almond butter. Other Unsalted popcorn and pretzels. The items listed above may not be a complete list of recommended foods or beverages. Contact your dietitian for more options. WHAT FOODS ARE NOT RECOMMENDED? Grains White bread. White pasta. White rice. Refined cornbread. Bagels and croissants. Crackers that contain trans fat. Vegetables Creamed or fried vegetables. Vegetables in a cheese sauce. Regular canned vegetables. Regular canned tomato sauce and paste. Regular tomato and vegetable juices. Fruits Dried fruits. Canned fruit in light or heavy syrup. Fruit juice. Meat and Other Protein Products  Fatty cuts of meat. Ribs, chicken wings, bacon, sausage, bologna, salami, chitterlings, fatback, hot dogs, bratwurst, and packaged luncheon meats. Salted nuts and seeds. Canned beans with  salt. Dairy Whole or 2% milk, cream, half-and-half, and cream cheese. Whole-fat or sweetened yogurt. Full-fat cheeses or blue cheese. Nondairy creamers and whipped toppings. Processed cheese, cheese spreads, or cheese curds. Condiments Onion and garlic salt, seasoned salt, table salt, and sea salt. Canned and packaged gravies. Worcestershire sauce. Tartar sauce. Barbecue sauce. Teriyaki sauce. Soy sauce, including reduced sodium. Steak sauce. Fish sauce. Oyster sauce. Cocktail sauce. Horseradish. Ketchup and mustard. Meat flavorings and tenderizers. Bouillon cubes. Hot sauce. Tabasco sauce. Marinades. Taco seasonings. Relishes. Fats and Oils Butter, stick margarine, lard, shortening, ghee, and bacon fat. Coconut, palm kernel, or palm oils. Regular salad dressings. Other Pickles and olives. Salted popcorn and pretzels. The items listed above may not be a complete list of foods and beverages to avoid. Contact your dietitian for more information. WHERE CAN I FIND MORE INFORMATION? National Heart, Lung, and Blood Institute: travelstabloid.com   This information is not intended to replace advice given to you by your health care provider. Make sure you discuss any questions you have with your health care provider.   Document Released: 05/05/2011 Document Revised: 06/06/2014 Document Reviewed: 03/20/2013 Elsevier Interactive Patient Education 2016 [place] is a skin condition that occurs in between folds of skin in places on the body that rub together a lot and do not get much ventilation. It is caused by heat, moisture, friction, sweat retention, and lack of air circulation, which produces red, irritated patches and, sometimes, scaling or drainage. People who have diabetes, who are obese, or who have treatment with antibiotics are at increased risk for intertrigo. The most common sites for intertrigo to occur include:  The groin.  The  breasts.  The armpits.  Folds of abdominal skin.  Webbed spaces between the fingers or toes. Intertrigo may be aggravated by:  Sweat.  Feces.  Yeast or bacteria that are present near skin folds.  Urine.  Vaginal discharge. HOME CARE INSTRUCTIONS  The following steps can be taken to reduce friction and keep the affected area cool and dry:  Expose skin folds to the air.  Keep deep skin folds separated with cotton or linen cloth. Avoid tight fitting clothing that could cause chafing.  Wear open-toed shoes or sandals to help reduce moisture between the toes.  Apply absorbent powders to affected areas as directed by your caregiver.  Apply over-the-counter barrier pastes, such as zinc oxide, as directed by your caregiver.  If you develop a fungal infection in the affected area, your caregiver may have you use antifungal creams. SEEK MEDICAL CARE IF:   The rash is not improving after 1 week of treatment.  The rash is getting worse (more red, more swollen, more painful, or spreading).  You have a fever or chills. MAKE SURE YOU:   Understand these instructions.  Will watch your condition.  Will get help right away if you are not doing well or get worse.   This information is not intended to replace advice given to you by your health care provider. Make sure you discuss any questions you have with your health care provider.   Document Released: 05/16/2005 Document Revised: 08/08/2011 Document Reviewed: 11/17/2014 Elsevier Interactive Patient Education [employer].
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
## 2015-08-06 ENCOUNTER — Telehealth (HOSPITAL_COMMUNITY): Payer: Self-pay | Admitting: *Deleted

## 2015-08-06 NOTE — Telephone Encounter (Signed)
Pt called and stated that the nystatin cream does not seem to be working. Pt requesting a return call

## 2015-08-06 NOTE — Telephone Encounter (Signed)
[place],  Is there anything else you can advise her to do? Please let me know. Thanks !

## 2015-08-11 ENCOUNTER — Ambulatory Visit: Payer: No Typology Code available for payment source

## 2015-08-11 VITALS — BP 130/86

## 2015-08-11 DIAGNOSIS — I10 Essential (primary) hypertension: Secondary | ICD-10-CM

## 2015-08-12 ENCOUNTER — Other Ambulatory Visit: Payer: Self-pay | Admitting: Family Medicine

## 2015-08-12 ENCOUNTER — Ambulatory Visit
Admission: RE | Admit: 2015-08-12 | Discharge: 2015-08-12 | Disposition: A | Discharge status: Home / Self Care | Payer: No Typology Code available for payment source | Source: Ambulatory Visit

## 2015-08-12 DIAGNOSIS — Z1231 Encounter for screening mammogram for malignant neoplasm of breast: Secondary | ICD-10-CM

## 2015-08-12 DIAGNOSIS — N63 Unspecified lump in unspecified breast: Secondary | ICD-10-CM

## 2015-08-19 ENCOUNTER — Ambulatory Visit
Admission: RE | Admit: 2015-08-19 | Discharge: 2015-08-19 | Disposition: A | Discharge status: Home / Self Care | Payer: No Typology Code available for payment source | Source: Ambulatory Visit | Attending: Family Medicine | Admitting: Family Medicine

## 2015-08-19 DIAGNOSIS — N63 Unspecified lump in unspecified breast: Secondary | ICD-10-CM

## 2015-08-23 ENCOUNTER — Encounter (HOSPITAL_COMMUNITY): Payer: Self-pay | Admitting: Emergency Medicine

## 2015-08-23 ENCOUNTER — Emergency Department (HOSPITAL_COMMUNITY)
Admission: EM | Admit: 2015-08-23 | Discharge: 2015-08-25 | Disposition: A | Discharge status: Other Acute Inpatient Hospital | Payer: Medicaid Other | Attending: Emergency Medicine | Admitting: Emergency Medicine

## 2015-08-23 DIAGNOSIS — I10 Essential (primary) hypertension: Secondary | ICD-10-CM | POA: Insufficient documentation

## 2015-08-23 DIAGNOSIS — Z87891 Personal history of nicotine dependence: Secondary | ICD-10-CM | POA: Insufficient documentation

## 2015-08-23 DIAGNOSIS — F312 Bipolar disorder, current episode manic severe with psychotic features: Secondary | ICD-10-CM | POA: Diagnosis present

## 2015-08-23 DIAGNOSIS — Z792 Long term (current) use of antibiotics: Secondary | ICD-10-CM | POA: Insufficient documentation

## 2015-08-23 DIAGNOSIS — F3110 Bipolar disorder, current episode manic without psychotic features, unspecified: Secondary | ICD-10-CM | POA: Insufficient documentation

## 2015-08-23 DIAGNOSIS — R45851 Suicidal ideations: Secondary | ICD-10-CM | POA: Insufficient documentation

## 2015-08-23 DIAGNOSIS — Z7952 Long term (current) use of systemic steroids: Secondary | ICD-10-CM | POA: Insufficient documentation

## 2015-08-23 DIAGNOSIS — Z046 Encounter for general psychiatric examination, requested by authority: Secondary | ICD-10-CM

## 2015-08-23 DIAGNOSIS — Z872 Personal history of diseases of the skin and subcutaneous tissue: Secondary | ICD-10-CM | POA: Insufficient documentation

## 2015-08-23 DIAGNOSIS — Z0289 Encounter for other administrative examinations: Secondary | ICD-10-CM | POA: Insufficient documentation

## 2015-08-23 DIAGNOSIS — F1310 Sedative, hypnotic or anxiolytic abuse, uncomplicated: Secondary | ICD-10-CM | POA: Insufficient documentation

## 2015-08-23 DIAGNOSIS — Z79899 Other long term (current) drug therapy: Secondary | ICD-10-CM | POA: Insufficient documentation

## 2015-08-23 LAB — COMPREHENSIVE METABOLIC PANEL
ALT: 40 U/L (ref 14–54)
AST: 35 U/L (ref 15–41)
Albumin: 4.4 g/dL (ref 3.5–5.0)
Alkaline Phosphatase: 64 U/L (ref 38–126)
Anion gap: 12 (ref 5–15)
BUN: 11 mg/dL (ref 6–20)
CO2: 23 mmol/L (ref 22–32)
Calcium: 9.7 mg/dL (ref 8.9–10.3)
Chloride: 102 mmol/L (ref 101–111)
Creatinine, Ser: 1.02 mg/dL — ABNORMAL HIGH (ref 0.44–1.00)
GFR calc Af Amer: >60 mL/min (ref >60)
GFR calc non Af Amer: >60 mL/min (ref >60)
Glucose, Bld: 108 mg/dL — ABNORMAL HIGH (ref 65–99)
Potassium: 4.1 mmol/L (ref 3.5–5.1)
Sodium: 137 mmol/L (ref 135–145)
Total Bilirubin: 0.5 mg/dL (ref 0.3–1.2)
Total Protein: 8.4 g/dL — ABNORMAL HIGH (ref 6.5–8.1)

## 2015-08-23 LAB — CBC
HCT: 37.8 % (ref 36.0–46.0)
Hemoglobin: 13 g/dL (ref 12.0–15.0)
MCH: 30.4 pg (ref 26.0–34.0)
MCHC: 34.4 g/dL (ref 30.0–36.0)
MCV: 88.3 fL (ref 78.0–100.0)
Platelets: 302 10*3/uL (ref 150–400)
RBC: 4.28 MIL/uL (ref 3.87–5.11)
RDW: 13.3 % (ref 11.5–15.5)
WBC: 4.7 10*3/uL (ref 4.0–10.5)

## 2015-08-23 LAB — SALICYLATE LEVEL: Salicylate Lvl: <4 mg/dL (ref 2.8–30.0)

## 2015-08-23 LAB — ETHANOL: Alcohol, Ethyl (B): <5 mg/dL (ref <5)

## 2015-08-23 LAB — ACETAMINOPHEN LEVEL: Acetaminophen (Tylenol), Serum: <10 ug/mL — ABNORMAL LOW (ref 10–30)

## 2015-08-23 MED ORDER — IBUPROFEN 200 MG PO TABS: 600.0000 mg | ORAL_TABLET | Freq: Three times a day (TID) | ORAL | Status: DC | PRN

## 2015-08-23 MED ORDER — QUETIAPINE FUMARATE ER 400 MG PO TB24
400.0000 mg | ORAL_TABLET | Freq: Every day | ORAL | Status: DC
  Filled 2015-08-23: qty 1

## 2015-08-23 MED ORDER — ALUM & MAG HYDROXIDE-SIMETH 200-200-20 MG/5ML PO SUSP: 30.0000 mL | ORAL | Status: DC | PRN

## 2015-08-23 MED ORDER — LORAZEPAM 1 MG PO TABS
1.0000 mg | ORAL_TABLET | Freq: Three times a day (TID) | ORAL | Status: DC | PRN
  Administered 2015-08-24 (×2): 1 mg via ORAL
  Filled 2015-08-23 (×2): qty 1

## 2015-08-23 MED ORDER — TRIAMCINOLONE ACETONIDE 0.1 % EX CREA
1.0000 | TOPICAL_CREAM | Freq: Two times a day (BID) | CUTANEOUS | Status: DC
Start: 2015-08-23 — End: 2015-08-25
  Administered 2015-08-24 – 2015-08-25 (×3): 1 via TOPICAL
  Filled 2015-08-23: qty 15

## 2015-08-23 MED ORDER — ACETAMINOPHEN 325 MG PO TABS
650.0000 mg | ORAL_TABLET | ORAL | Status: DC | PRN
  Administered 2015-08-24: 650 mg via ORAL
  Filled 2015-08-23: qty 2

## 2015-08-23 MED ORDER — SULFAMETHOXAZOLE-TRIMETHOPRIM 800-160 MG PO TABS
1.0000 | ORAL_TABLET | Freq: Two times a day (BID) | ORAL | Status: DC
  Administered 2015-08-24 – 2015-08-25 (×3): 1 via ORAL
  Filled 2015-08-23 (×3): qty 1

## 2015-08-23 MED ORDER — AMLODIPINE BESYLATE 5 MG PO TABS
5.0000 mg | ORAL_TABLET | Freq: Every day | ORAL | Status: DC
  Administered 2015-08-24 – 2015-08-25 (×2): 5 mg via ORAL
  Filled 2015-08-23 (×2): qty 1

## 2015-08-23 MED ORDER — QUETIAPINE FUMARATE 100 MG PO TABS
100.0000 mg | ORAL_TABLET | Freq: Every day | ORAL | Status: DC
  Administered 2015-08-23: 100 mg via ORAL
  Filled 2015-08-23: qty 1

## 2015-08-23 MED ORDER — TRAZODONE HCL 100 MG PO TABS: 100.0000 mg | ORAL_TABLET | Freq: Every evening | ORAL | Status: DC | PRN

## 2015-08-23 MED ORDER — ONDANSETRON HCL 4 MG PO TABS: 4.0000 mg | ORAL_TABLET | Freq: Three times a day (TID) | ORAL | Status: DC | PRN

## 2015-08-23 MED ORDER — CARVEDILOL 6.25 MG PO TABS
6.2500 mg | ORAL_TABLET | Freq: Two times a day (BID) | ORAL | Status: DC
  Administered 2015-08-24 – 2015-08-25 (×3): 6.25 mg via ORAL
  Filled 2015-08-23 (×5): qty 1

## 2015-08-23 MED ORDER — GABAPENTIN 100 MG PO CAPS
100.0000 mg | ORAL_CAPSULE | Freq: Two times a day (BID) | ORAL | Status: DC
  Administered 2015-08-23 – 2015-08-24 (×2): 100 mg via ORAL
  Filled 2015-08-23 (×2): qty 1

## 2015-08-23 MED ORDER — TEMAZEPAM 15 MG PO CAPS
15.0000 mg | ORAL_CAPSULE | Freq: Every evening | ORAL | Status: DC | PRN
  Administered 2015-08-25: 15 mg via ORAL
  Filled 2015-08-23 (×2): qty 1

## 2015-08-23 MED ORDER — ZOLPIDEM TARTRATE 5 MG PO TABS: 5.0000 mg | ORAL_TABLET | Freq: Every evening | ORAL | Status: DC | PRN

## 2015-08-23 MED ORDER — VITAMIN D (ERGOCALCIFEROL) 1.25 MG (50000 UNIT) PO CAPS
50000.0000 [IU] | ORAL_CAPSULE | ORAL | Status: DC
  Administered 2015-08-24: 50000 [IU] via ORAL
  Filled 2015-08-23: qty 1

## 2015-08-23 MED ORDER — CLONAZEPAM 0.5 MG PO TABS
0.2500 mg | ORAL_TABLET | Freq: Every day | ORAL | Status: DC
  Administered 2015-08-23 – 2015-08-24 (×2): 0.25 mg via ORAL
  Filled 2015-08-23 (×2): qty 1

## 2015-08-23 NOTE — ED Notes (Signed)
Pt. Transferred to [facility] from ED to [place]. Report from RN. Pt. Oriented to unit including Q15 minute rounds as well as the security cameras for their protection. Patient is alert and oriented, warm and dry in no acute distress. Patient denies SI, HI, and AVH. Pt. Encouraged to let me know if needs arise.
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
## 2015-08-23 NOTE — ED Notes (Signed)
Bed: [facility] Expected date:  Expected time:  Means of arrival:  Comments: T4

## 2015-08-23 NOTE — BH Assessment (Signed)
Assessment completed. Consulted Dr. Nicole Cella who agrees that pt meets inpatient criteria. TTS to seek placement.
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
## 2015-08-23 NOTE — ED Notes (Signed)
Pt to bathroom, pt states she forgot to collect urine sample
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
## 2015-08-24 DIAGNOSIS — F312 Bipolar disorder, current episode manic severe with psychotic features: Secondary | ICD-10-CM

## 2015-08-24 DIAGNOSIS — R45850 Homicidal ideations: Secondary | ICD-10-CM

## 2015-08-24 LAB — RAPID URINE DRUG SCREEN, HOSP PERFORMED
Amphetamines: NOT DETECTED
Barbiturates: NOT DETECTED
Benzodiazepines: POSITIVE — AB
Cocaine: NOT DETECTED
Opiates: NOT DETECTED
Tetrahydrocannabinol: NOT DETECTED

## 2015-08-24 MED ORDER — ZIPRASIDONE MESYLATE 20 MG IM SOLR
INTRAMUSCULAR | Status: AC
  Filled 2015-08-24: qty 20

## 2015-08-24 MED ORDER — TRAZODONE HCL 50 MG PO TABS
50.0000 mg | ORAL_TABLET | Freq: Every evening | ORAL | Status: DC | PRN
  Administered 2015-08-24 – 2015-08-25 (×2): 50 mg via ORAL
  Filled 2015-08-24 (×2): qty 1

## 2015-08-24 MED ORDER — QUETIAPINE FUMARATE ER 400 MG PO TB24
400.0000 mg | ORAL_TABLET | Freq: Every day | ORAL | Status: DC
  Administered 2015-08-24: 400 mg via ORAL
  Filled 2015-08-24 (×2): qty 1

## 2015-08-24 MED ORDER — GABAPENTIN 300 MG PO CAPS: 300.0000 mg | ORAL_CAPSULE | Freq: Two times a day (BID) | ORAL | Status: DC

## 2015-08-24 MED ORDER — GABAPENTIN 100 MG PO CAPS
200.0000 mg | ORAL_CAPSULE | Freq: Every day | ORAL | Status: DC
  Administered 2015-08-24: 200 mg via ORAL
  Filled 2015-08-24 (×2): qty 2

## 2015-08-24 MED ORDER — GABAPENTIN 100 MG PO CAPS: 100.0000 mg | ORAL_CAPSULE | Freq: Every day | ORAL | Status: DC

## 2015-08-24 MED ORDER — LORAZEPAM 2 MG/ML IJ SOLN
INTRAMUSCULAR | Status: AC
  Filled 2015-08-24: qty 1

## 2015-08-24 MED ORDER — ZIPRASIDONE MESYLATE 20 MG IM SOLR
10.0000 mg | Freq: Once | INTRAMUSCULAR | Status: AC
  Administered 2015-08-24: 10 mg via INTRAMUSCULAR

## 2015-08-24 MED ORDER — LORAZEPAM 2 MG/ML IJ SOLN
1.0000 mg | Freq: Once | INTRAMUSCULAR | Status: AC
  Administered 2015-08-24: 1 mg via INTRAVENOUS

## 2015-08-24 NOTE — ED Notes (Signed)
Patient noted sleeping in room. No complaints, stable, in no acute distress. Q15 minute rounds and monitoring via Security Cameras to continue.  
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
## 2015-08-24 NOTE — ED Notes (Signed)
Patient noted in shower room. No complaints, stable, in no acute distress. Q15 minute rounds and monitoring via [employer] to continue.
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
## 2015-08-24 NOTE — ED Notes (Signed)
Pt. Pacing in front of nurses station yelling and cursing and threatening staff. Redirection attempted without effect.

## 2015-08-24 NOTE — Progress Notes (Signed)
Pt observed in milieu at long intervals during shift. Pt has been verbally aggressive and abusive towards staff and a visitor, called them "white bitch, white devil". Pt required multiple verbal redirections related to being verbally and physically intrusive towards peers and staff (going into peers rooms and standing at their door). Medications administered as per [facility] and pt has been compliant. Encouragement, support and availability offered to pt throughout this shift. Q 15 minutes checks maintained for safety.

## 2015-08-24 NOTE — ED Notes (Signed)
Pt. irritable and argumentative, redirection attempted with limited results.

## 2015-08-25 ENCOUNTER — Inpatient Hospital Stay (HOSPITAL_COMMUNITY)
Admission: AD | Admit: 2015-08-25 | Discharge: 2015-09-07 | DRG: 885 | Disposition: A | Discharge status: Home / Self Care | Payer: Federal, State, Local not specified - Other | Attending: Psychiatry | Admitting: Psychiatry

## 2015-08-25 ENCOUNTER — Encounter (HOSPITAL_COMMUNITY): Payer: Self-pay | Admitting: *Deleted

## 2015-08-25 DIAGNOSIS — Z8659 Personal history of other mental and behavioral disorders: Secondary | ICD-10-CM

## 2015-08-25 DIAGNOSIS — I10 Essential (primary) hypertension: Secondary | ICD-10-CM | POA: Diagnosis present

## 2015-08-25 DIAGNOSIS — F4310 Post-traumatic stress disorder, unspecified: Secondary | ICD-10-CM | POA: Diagnosis present

## 2015-08-25 DIAGNOSIS — F319 Bipolar disorder, unspecified: Secondary | ICD-10-CM | POA: Diagnosis present

## 2015-08-25 DIAGNOSIS — F3164 Bipolar disorder, current episode mixed, severe, with psychotic features: Secondary | ICD-10-CM | POA: Diagnosis present

## 2015-08-25 DIAGNOSIS — R21 Rash and other nonspecific skin eruption: Secondary | ICD-10-CM

## 2015-08-25 DIAGNOSIS — Z9889 Other specified postprocedural states: Secondary | ICD-10-CM

## 2015-08-25 DIAGNOSIS — F259 Schizoaffective disorder, unspecified: Secondary | ICD-10-CM | POA: Diagnosis present

## 2015-08-25 MED ORDER — VITAMIN D (ERGOCALCIFEROL) 1.25 MG (50000 UNIT) PO CAPS
50000.0000 [IU] | ORAL_CAPSULE | ORAL | Status: DC
  Administered 2015-08-31 – 2015-09-07 (×2): 50000 [IU] via ORAL
  Filled 2015-08-25 (×2): qty 1

## 2015-08-25 MED ORDER — MAGNESIUM HYDROXIDE 400 MG/5ML PO SUSP: 30.0000 mL | Freq: Every day | ORAL | Status: DC | PRN

## 2015-08-25 MED ORDER — SULFAMETHOXAZOLE-TRIMETHOPRIM 800-160 MG PO TABS
1.0000 | ORAL_TABLET | Freq: Two times a day (BID) | ORAL | Status: DC
Start: 2015-08-25 — End: 2015-08-26
  Administered 2015-08-25 – 2015-08-26 (×2): 1 via ORAL
  Filled 2015-08-25 (×6): qty 1

## 2015-08-25 MED ORDER — ACETAMINOPHEN 325 MG PO TABS
650.0000 mg | ORAL_TABLET | Freq: Four times a day (QID) | ORAL | Status: DC | PRN
  Administered 2015-08-25 – 2015-09-06 (×9): 650 mg via ORAL
  Filled 2015-08-25 (×10): qty 2

## 2015-08-25 MED ORDER — AMLODIPINE BESYLATE 5 MG PO TABS
5.0000 mg | ORAL_TABLET | Freq: Every day | ORAL | Status: DC
  Administered 2015-08-26 – 2015-09-07 (×12): 5 mg via ORAL
  Filled 2015-08-25 (×10): qty 1
  Filled 2015-08-25: qty 7
  Filled 2015-08-25 (×5): qty 1

## 2015-08-25 MED ORDER — ZIPRASIDONE MESYLATE 20 MG IM SOLR
20.0000 mg | Freq: Once | INTRAMUSCULAR | Status: AC
  Administered 2015-08-25: 20 mg via INTRAMUSCULAR
  Filled 2015-08-25: qty 20

## 2015-08-25 MED ORDER — GABAPENTIN 300 MG PO CAPS: 300.0000 mg | ORAL_CAPSULE | Freq: Two times a day (BID) | ORAL | Status: DC

## 2015-08-25 MED ORDER — CARVEDILOL 6.25 MG PO TABS
6.2500 mg | ORAL_TABLET | Freq: Two times a day (BID) | ORAL | Status: DC
  Administered 2015-08-25 – 2015-08-26 (×2): 6.25 mg via ORAL
  Filled 2015-08-25: qty 2
  Filled 2015-08-25 (×4): qty 1
  Filled 2015-08-25: qty 2

## 2015-08-25 MED ORDER — TRAZODONE HCL 50 MG PO TABS
50.0000 mg | ORAL_TABLET | Freq: Every evening | ORAL | Status: DC | PRN
  Administered 2015-08-25: 50 mg via ORAL
  Filled 2015-08-25 (×2): qty 1

## 2015-08-25 MED ORDER — QUETIAPINE FUMARATE ER 400 MG PO TB24
400.0000 mg | ORAL_TABLET | Freq: Every day | ORAL | Status: DC
  Administered 2015-08-25: 400 mg via ORAL
  Filled 2015-08-25 (×4): qty 1

## 2015-08-25 MED ORDER — GABAPENTIN 300 MG PO CAPS
300.0000 mg | ORAL_CAPSULE | Freq: Two times a day (BID) | ORAL | Status: DC
  Administered 2015-08-25 – 2015-08-27 (×4): 300 mg via ORAL
  Filled 2015-08-25 (×11): qty 1

## 2015-08-25 MED ORDER — DIPHENHYDRAMINE HCL 50 MG/ML IJ SOLN
50.0000 mg | Freq: Once | INTRAMUSCULAR | Status: AC
  Administered 2015-08-25: 50 mg via INTRAMUSCULAR

## 2015-08-25 MED ORDER — TRIAMCINOLONE ACETONIDE 0.1 % EX CREA
1.0000 "application " | TOPICAL_CREAM | Freq: Two times a day (BID) | CUTANEOUS | Status: DC
  Administered 2015-08-26 – 2015-09-04 (×20): 1 via TOPICAL
  Filled 2015-08-25 (×2): qty 15

## 2015-08-25 MED ORDER — CLONAZEPAM 0.5 MG PO TABS: 0.5000 mg | ORAL_TABLET | Freq: Two times a day (BID) | ORAL | Status: DC

## 2015-08-25 MED ORDER — STERILE WATER FOR INJECTION IJ SOLN
INTRAMUSCULAR | Status: AC
  Filled 2015-08-25: qty 10

## 2015-08-25 MED ORDER — TEMAZEPAM 15 MG PO CAPS
15.0000 mg | ORAL_CAPSULE | Freq: Every evening | ORAL | Status: DC | PRN
  Administered 2015-08-26: 15 mg via ORAL
  Filled 2015-08-25: qty 1

## 2015-08-25 MED ORDER — TRAZODONE HCL 100 MG PO TABS
100.0000 mg | ORAL_TABLET | Freq: Every evening | ORAL | Status: DC | PRN
  Administered 2015-08-25 – 2015-08-27 (×4): 100 mg via ORAL
  Filled 2015-08-25 (×10): qty 1

## 2015-08-25 MED ORDER — DIPHENHYDRAMINE HCL 50 MG/ML IJ SOLN
50.0000 mg | Freq: Once | INTRAMUSCULAR | Status: DC
Start: 2015-08-25 — End: 2015-08-25
  Filled 2015-08-25: qty 1

## 2015-08-25 MED ORDER — CLONAZEPAM 0.5 MG PO TABS
0.5000 mg | ORAL_TABLET | Freq: Two times a day (BID) | ORAL | Status: DC
  Administered 2015-08-26 – 2015-08-27 (×3): 0.5 mg via ORAL
  Filled 2015-08-25 (×4): qty 1

## 2015-08-25 MED ORDER — LORAZEPAM 2 MG/ML IJ SOLN
2.0000 mg | Freq: Once | INTRAMUSCULAR | Status: AC
Start: 2015-08-25 — End: 2015-08-25
  Administered 2015-08-25: 2 mg via INTRAMUSCULAR
  Filled 2015-08-25: qty 1

## 2015-08-25 NOTE — ED Notes (Signed)
Pt discharged ambulatory with [employer].  Pt was calm and cooperative at discharge.  All belongings were returned to pt.
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
## 2015-08-25 NOTE — ED Notes (Signed)
Patient incontinant

## 2015-08-25 NOTE — ED Notes (Signed)
Patient received visible and irritable with staff. Patient pleasant with select peers and staff. Patient denies SI, HI A/V H, and contracted for safety. Patient endorses dental pain and requested oragel for patient reported cracked tooth and physician contacted but no orders received. Patient calm and has some fixed delusion and is care taking others. Will monitor Q 15 min for safety

## 2015-08-25 NOTE — Tx Team (Signed)
Initial Interdisciplinary Treatment Plan   PATIENT STRESSORS: Financial difficulties Marital or family conflict Occupational concerns   PATIENT STRENGTHS: General fund of knowledge Religious Affiliation Supportive family/friends   PROBLEM LIST: Problem List/Patient Goals Date to be addressed Date deferred Reason deferred Estimated date of resolution  At risk for suicide 08/25/2015  08/25/2015   D/C  Depression 08/25/2015  08/25/2015   D/C  "I just need a break from my husband" 08/25/2015  08/25/2015   D/C  Financial issues "I haven't worked in a year". 08/25/2015  08/25/2015   D/C                                 DISCHARGE CRITERIA:  Ability to meet basic life and health needs Improved stabilization in mood, thinking, and/or behavior Motivation to continue treatment in a less acute level of care Need for constant or close observation no longer present  PRELIMINARY DISCHARGE PLAN: Outpatient therapy Return to previous living arrangement  PATIENT/FAMIILY INVOLVEMENT: This treatment plan has been presented to and reviewed with the patient, Sanjuanita Hoffmeyer.  The patient and family have been given the opportunity to ask questions and make suggestions.  Donne Hazel P 08/25/2015, 9:47 PM

## 2015-08-25 NOTE — BH Assessment (Signed)
[facility] Assessment Progress Note  Per Corena Pilgrim, MD, this pt requires psychiatric hospitalization at this time.  Letitia Libra, RN, [facility] has assigned pt to [facility] Rm [id number].  Pt presents under IVC initiated by her husband and upheld by Dr Darleene Cleaver, and IVC documents have been faxed to [facility].  Pt's nurse, Nena Jordan, has been notified, and agrees to call report to [phone].  Pt is to be transported via Event organiser.  Jalene Mullet, [place] Triage Specialist [phone]

## 2015-08-26 ENCOUNTER — Other Ambulatory Visit: Payer: Self-pay

## 2015-08-26 ENCOUNTER — Encounter (HOSPITAL_COMMUNITY): Payer: Self-pay | Admitting: Psychiatry

## 2015-08-26 DIAGNOSIS — Z8659 Personal history of other mental and behavioral disorders: Secondary | ICD-10-CM

## 2015-08-26 DIAGNOSIS — I10 Essential (primary) hypertension: Secondary | ICD-10-CM | POA: Diagnosis present

## 2015-08-26 DIAGNOSIS — Z9889 Other specified postprocedural states: Secondary | ICD-10-CM

## 2015-08-26 DIAGNOSIS — F3164 Bipolar disorder, current episode mixed, severe, with psychotic features: Secondary | ICD-10-CM | POA: Diagnosis present

## 2015-08-26 MED ORDER — CARVEDILOL 3.125 MG PO TABS
3.1250 mg | ORAL_TABLET | Freq: Every day | ORAL | Status: DC
  Administered 2015-08-26 – 2015-09-06 (×12): 3.125 mg via ORAL
  Filled 2015-08-26: qty 7
  Filled 2015-08-26 (×13): qty 1

## 2015-08-26 MED ORDER — OLANZAPINE 10 MG IM SOLR: 10.0000 mg | Freq: Three times a day (TID) | INTRAMUSCULAR | Status: DC | PRN

## 2015-08-26 MED ORDER — LAMOTRIGINE 25 MG PO TABS
25.0000 mg | ORAL_TABLET | Freq: Every day | ORAL | Status: DC
  Administered 2015-08-26 – 2015-08-27 (×2): 25 mg via ORAL
  Filled 2015-08-26 (×4): qty 1

## 2015-08-26 MED ORDER — LORAZEPAM 2 MG/ML IJ SOLN
1.0000 mg | Freq: Once | INTRAMUSCULAR | Status: AC
  Administered 2015-08-26: 1 mg via INTRAVENOUS

## 2015-08-26 MED ORDER — SULFAMETHOXAZOLE-TRIMETHOPRIM 800-160 MG PO TABS
1.0000 | ORAL_TABLET | Freq: Two times a day (BID) | ORAL | Status: DC
  Filled 2015-08-26 (×4): qty 1

## 2015-08-26 MED ORDER — LORAZEPAM 2 MG/ML IJ SOLN
INTRAMUSCULAR | Status: AC
  Filled 2015-08-26: qty 1

## 2015-08-26 MED ORDER — SULFAMETHOXAZOLE-TRIMETHOPRIM 800-160 MG PO TABS
1.0000 | ORAL_TABLET | Freq: Two times a day (BID) | ORAL | Status: AC
  Administered 2015-08-26 – 2015-09-02 (×14): 1 via ORAL
  Filled 2015-08-26 (×15): qty 1

## 2015-08-26 MED ORDER — LAMOTRIGINE 100 MG PO TABS: 100.0000 mg | ORAL_TABLET | Freq: Every day | ORAL | Status: DC

## 2015-08-26 MED ORDER — ZIPRASIDONE MESYLATE 20 MG IM SOLR
20.0000 mg | Freq: Once | INTRAMUSCULAR | Status: AC
  Administered 2015-08-26: 20 mg via INTRAMUSCULAR
  Filled 2015-08-26: qty 20

## 2015-08-26 MED ORDER — QUETIAPINE FUMARATE ER 200 MG PO TB24
500.0000 mg | ORAL_TABLET | Freq: Every day | ORAL | Status: DC
  Administered 2015-08-26 – 2015-08-27 (×2): 500 mg via ORAL
  Filled 2015-08-26 (×3): qty 1

## 2015-08-26 MED ORDER — ZIPRASIDONE MESYLATE 20 MG IM SOLR
INTRAMUSCULAR | Status: AC
  Filled 2015-08-26: qty 20

## 2015-08-26 MED ORDER — CARVEDILOL 3.125 MG PO TABS: 3.1250 mg | ORAL_TABLET | Freq: Two times a day (BID) | ORAL | Status: DC

## 2015-08-26 MED ORDER — OLANZAPINE 10 MG PO TBDP
10.0000 mg | ORAL_TABLET | Freq: Three times a day (TID) | ORAL | Status: DC | PRN
Start: 2015-08-26 — End: 2015-08-27
  Administered 2015-08-26 – 2015-08-27 (×3): 10 mg via ORAL
  Filled 2015-08-26 (×3): qty 1
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
## 2015-08-26 NOTE — BHH Group Notes (Signed)
[facility] LCSW Aftercare Discharge Planning Group Note   08/26/2015 9:53 AM  Participation Quality: Active   Mood/Affect:  Excited  Depression Rating:    Anxiety Rating:    Thoughts of Suicide:  No Will you contract for safety?   NA  Current AVH:  No  Plan for Discharge/Comments:  Pt presents as intrusive and inappropriate towards others. Reported that her mother died in 2022-11-21 and that she has been having a hard time with that. Pt also spoke about her husband having another family and that she would not be going back to him. "I'm going to flirt with everyone that I can." She has been seeing a therapist at [facility].  Transportation Means:   Supports: Mental Health Providers   Georga Kaufmann

## 2015-08-26 NOTE — BHH Group Notes (Signed)
[facility], [employer] Group Therapy  08/26/2015 , 1:31 PM    Type of Therapy:  M[employer] Presentation  Participation Level:  Active  Participation Quality:  Attentive  Affect:  Blunted  Cognitive:  Oriented  Insight:  Limited  Engagement in Therapy:  Engaged  Modes of Intervention:  Discussion, Education and Socialization  Summary of Progress/Problems:  [name] from [place] came to present his recovery story and play the guitar.  In day room initially with lunch, but walked out when speaker arrived.  Door was locked so she could not return due to loud, intrusive nature of her interactions.  [name] 08/26/2015 , 1:31 PM
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
## 2015-08-26 NOTE — Tx Team (Signed)
Interdisciplinary Treatment Plan Update (Adult)  Date:  08/26/2015 Time Reviewed:  3:23 PM  Progress in Treatment: Attending groups: Yes. Participating in groups: Yes. Taking medication as prescribed:  Yes. Tolerating medication:  Yes. Family/Significant other contact made:  No Patient understands diagnosis:  No, limited insight. Discussing patient identified problems/goals with staff:  Yes, see initial care plan. Medical problems stabilized or resolved:  Yes Denies suicidal/homicidal ideation: Yes. Issues/concerns per patient self-inventory: No. Other:  New problem(s) identified:  Discharge Plan or Barriers: See below   Reason for Continuation of Hospitalization: Depression Hallucinations Mania Medication stabilization  Comments: Stacie Daugherty is a 55 y.o. AA female, married , lives with her husband in [place], has a hx of Bipolar disorder, HTN, chronic pedal edema, recent breast lump removal, who presented to [facility] after being petitioned by her husband for involuntary commitment for worsening manic behavior at home. Klonopin, Lamictal, Seroquel, Desyrel  Estimated length of stay: 4-5 days  New goal(s):  Review of initial/current patient goals per problem list:  1. Goal(s): Patient will participate in aftercare plan  Met: Yes   Target date: at discharge  As evidenced by: Patient will participate within aftercare plan AEB aftercare provider and housing plan at discharge being identified.  08/26/15: Pt will return home and follow-up with [facility].    6. Goal (s): Patient will demonstrate decreased signs of mania  * Met: No  * Target date: 3-5 days post admission date  * As evidenced by: Patient demonstrate decreased signs of mania AEB decreased mood instability and demonstration of stable mood   08/26/15: Pt is sexually inappropriate, intrusive, loud  and  presents with pressured speech.  Attendees: Patient:  08/26/2015 3:23 PM  Family:   08/26/2015 3:23 PM   Physician:  Dr. Ursula Alert, MD 08/26/2015 3:23 PM  Nursing: Gerald Leitz RN  08/26/2015 3:23 PM  Case Manager:  Roque Lias, LCSW 08/26/2015 3:23 PM  Counselor:  Matthew Saras, MSW Intern 08/26/2015 3:23 PM  Other:   08/26/2015 3:23 PM  Other:   08/26/2015 3:23 PM  Other:   08/26/2015 3:23 PM  Other:  08/26/2015 3:23 PM  Other:    Other:    Other:    Other:    Other:    Other:      Scribe for Treatment Team:   Georga Kaufmann, MSW Intern 08/26/2015 3:23 PM

## 2015-08-26 NOTE — Progress Notes (Signed)
Patient observed in hallway continuing to speak loudly. Peers visibly agitated. She has refused to allow staff to obtain VS. In NAD. RR WNL, color appropriate for ethnicity. Report given to [facility].
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
## 2015-08-26 NOTE — Progress Notes (Signed)
Stacie Daugherty has been asleep most of the night. She awoke once and was calm and cooperative. Will continue to monitor Q 15 minutes for patient safety and medication effectiveness.
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
## 2015-08-27 LAB — LIPID PANEL
Cholesterol: 171 mg/dL (ref 0–200)
HDL: 63 mg/dL (ref >40)
LDL Cholesterol: 95 mg/dL (ref 0–99)
Total CHOL/HDL Ratio: 2.7 RATIO
Triglycerides: 66 mg/dL (ref <150)
VLDL: 13 mg/dL (ref 0–40)

## 2015-08-27 LAB — TSH: TSH: 0.446 u[IU]/mL (ref 0.350–4.500)

## 2015-08-27 MED ORDER — BENZOCAINE 10 % MT GEL
Freq: Four times a day (QID) | OROMUCOSAL | Status: DC | PRN
  Administered 2015-08-27 – 2015-09-05 (×4): via OROMUCOSAL
  Filled 2015-08-27: qty 9.4

## 2015-08-27 MED ORDER — LORAZEPAM 1 MG PO TABS
1.0000 mg | ORAL_TABLET | Freq: Four times a day (QID) | ORAL | Status: DC | PRN
  Administered 2015-08-27 – 2015-09-03 (×7): 1 mg via ORAL
  Filled 2015-08-27 (×7): qty 1

## 2015-08-27 MED ORDER — LORAZEPAM 2 MG/ML IJ SOLN: 1.0000 mg | Freq: Four times a day (QID) | INTRAMUSCULAR | Status: DC | PRN

## 2015-08-27 MED ORDER — ZIPRASIDONE MESYLATE 20 MG IM SOLR: 20.0000 mg | Freq: Two times a day (BID) | INTRAMUSCULAR | Status: DC | PRN

## 2015-08-27 MED ORDER — ARIPIPRAZOLE 5 MG PO TABS
5.0000 mg | ORAL_TABLET | Freq: Every day | ORAL | Status: DC
  Administered 2015-08-27: 5 mg via ORAL
  Filled 2015-08-27 (×3): qty 1

## 2015-08-27 MED ORDER — TRAZODONE HCL 100 MG PO TABS: 100.0000 mg | ORAL_TABLET | Freq: Every evening | ORAL | Status: DC | PRN

## 2015-08-27 MED ORDER — TEMAZEPAM 15 MG PO CAPS
30.0000 mg | ORAL_CAPSULE | Freq: Every day | ORAL | Status: DC
  Administered 2015-08-27 – 2015-09-06 (×11): 30 mg via ORAL
  Filled 2015-08-27 (×9): qty 2
  Filled 2015-08-27: qty 1
  Filled 2015-08-27 (×2): qty 2

## 2015-08-27 MED ORDER — LAMOTRIGINE 25 MG PO TABS
25.0000 mg | ORAL_TABLET | Freq: Two times a day (BID) | ORAL | Status: DC
  Administered 2015-08-27 – 2015-09-07 (×22): 25 mg via ORAL
  Filled 2015-08-27 (×5): qty 1
  Filled 2015-08-27: qty 14
  Filled 2015-08-27 (×6): qty 1
  Filled 2015-08-27: qty 14
  Filled 2015-08-27 (×13): qty 1

## 2015-08-27 MED ORDER — LORAZEPAM 1 MG PO TABS
1.0000 mg | ORAL_TABLET | Freq: Two times a day (BID) | ORAL | Status: DC
  Administered 2015-08-27 – 2015-09-07 (×22): 1 mg via ORAL
  Filled 2015-08-27 (×22): qty 1

## 2015-08-27 MED ORDER — ZIPRASIDONE HCL 20 MG PO CAPS
20.0000 mg | ORAL_CAPSULE | Freq: Two times a day (BID) | ORAL | Status: DC | PRN
  Administered 2015-08-28 – 2015-09-01 (×3): 20 mg via ORAL
  Filled 2015-08-27 (×3): qty 1

## 2015-08-27 MED ORDER — GABAPENTIN 300 MG PO CAPS
300.0000 mg | ORAL_CAPSULE | ORAL | Status: DC
  Administered 2015-08-27 – 2015-09-07 (×34): 300 mg via ORAL
  Filled 2015-08-27 (×11): qty 1
  Filled 2015-08-27: qty 21
  Filled 2015-08-27 (×6): qty 1
  Filled 2015-08-27: qty 21
  Filled 2015-08-27 (×6): qty 1
  Filled 2015-08-27: qty 21
  Filled 2015-08-27 (×15): qty 1

## 2015-08-27 NOTE — BHH Group Notes (Signed)
[facility] Group Notes:  (Nursing/MHT/Case Management/Adjunct)  Date:  08/27/2015  Time:  0930  Type of Therapy:  Nurse Education - Healthy Lifestyle Changes  Participation Level:  Minimal  Participation Quality:  Monopolizing  Affect:  Excited  Cognitive:  Alert  Insight:  Limited  Engagement in Group:  Limited  Modes of Intervention:  Education  Summary of Progress/Problems: Patient came into group towards the end. Engaged in some discussion. Insight remains limited.  Loletta Specter [facility] 08/27/2015, 1000

## 2015-08-27 NOTE — Progress Notes (Signed)
Kittie is awake on the unit. She is laughing inappropriately, very loud on the unit in the hallway. Singing gospel songs as loud as she can once she is redirected back to her room. She asks one of the RNs if she can have scissors to cut her wig off for breakfast then she asks him to cut if off for her. The RN refused both of her requests. She then goes back down the hall laughing out loud and talking to herself. Requiring frequent redirection to keep her voice down. Will continue to monitor.

## 2015-08-27 NOTE — Progress Notes (Signed)
Patient remains loud, intrusive and sexually inappropriate. Does not maintain physical boundaries with peers.Observed attempting to walk into other patient's rooms. Speaking suggestively to staff and peers. Continues to laugh loudly with no apparent cause. Thought processes are tangential. Continues to ambulate in and out of wheelchair. (Patient is not a fall risk however states her feet hurt.) Gait slow but steady when on feet. Medicated per orders. Prn zydis given per her request late morning. Also tylenol for chronic upper back pain. Refusing self inventory at this time. Redirect behavior as needed. She denies SI/HI/AVH and remains safe on level III obs. Will continue to monitor behavior, boundaries closely.

## 2015-08-27 NOTE — Plan of Care (Signed)
Problem: Ineffective individual coping Goal: STG: Patient will remain free from self harm Outcome: Progressing Patient has remained free of self harm, denies SI.  Problem: Alteration in thought process Goal: STG-Patient is able to sleep at least 6 hours per night Outcome: Not Progressing Patient is not sleeping adequately. Patient slept 3.25 hours last night.

## 2015-08-27 NOTE — Progress Notes (Signed)
The patient has been observed sleeping in the dayroom on and off over the past hour or so. She fell asleep while eating her snack as well as while sitting amongst her peers. THis Pryor Curia has encouraged the patient to go to sleep without any success. She openly admits to not having slept well last evening, but rationalizes that she is taking too much medication. The patient was redirected for leaning forward as if to kiss a female peer right in front of this author. She is also complaining of having a need for a wheelchair despite her steady gait.
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
## 2015-08-27 NOTE — BHH Group Notes (Signed)
[facility] Group Notes:  (Counselor/Nursing/MHT/Case Management/Adjunct)  08/27/2015 1:15PM  Type of Therapy:  Group Therapy  Participation Level:  Active  Participation Quality:  Appropriate  Affect:  Flat  Cognitive:  Oriented  Insight:  Improving  Engagement in Group:  Limited  Engagement in Therapy:  Limited  Modes of Intervention:  Discussion, Exploration and Socialization  Summary of Progress/Problems: The topic for group was balance in life.  Pt participated in the discussion about when their life was in balance and out of balance and how this feels.  Pt discussed ways to get back in balance and short term goals they can work on to get where they want to be.  Was initially disinvited from group, Forced her way in when another patient left.  We ended up moving the group to a different venue when she would not leave.   [name] 08/27/2015 1:17 PM

## 2015-08-28 LAB — HEMOGLOBIN A1C
Hgb A1c MFr Bld: 5.7 % — ABNORMAL HIGH (ref 4.8–5.6)
Mean Plasma Glucose: 117 mg/dL

## 2015-08-28 LAB — PREGNANCY, URINE: Preg Test, Ur: NEGATIVE

## 2015-08-28 LAB — PROLACTIN: Prolactin: 49.8 ng/mL — ABNORMAL HIGH (ref 4.8–23.3)

## 2015-08-28 MED ORDER — ARIPIPRAZOLE 10 MG PO TABS
10.0000 mg | ORAL_TABLET | Freq: Every evening | ORAL | Status: DC
  Administered 2015-08-28: 10 mg via ORAL
  Filled 2015-08-28 (×2): qty 1

## 2015-08-28 MED ORDER — QUETIAPINE FUMARATE ER 300 MG PO TB24
300.0000 mg | ORAL_TABLET | Freq: Every day | ORAL | Status: DC
  Administered 2015-08-28: 300 mg via ORAL
  Filled 2015-08-28 (×2): qty 1

## 2015-08-28 NOTE — Progress Notes (Signed)
[facility] Group Notes:  (Nursing/MHT/Case Management/Adjunct)  Date:  08/28/2015  Time:  9:09 PM  Type of Therapy:  Psychoeducational Skills  Participation Level:  Active  Participation Quality:  Drowsy and Monopolizing  Affect:  Blunted and Lethargic  Cognitive:  Appropriate  Insight:  Improving  Engagement in Group:  Improving  Modes of Intervention:  Education  Summary of Progress/Problems: The patient expressed in group that her husband and son visited this evening. She also mentioned that she was proud of the fact that she attended more of her groups today. The patient is complaining of having swelling in her bed and she was encouraged to stay off of her feet. As for the theme of the day, her relapse prevention strategy will be to "get sleep".   Archie Balboa S 08/28/2015, 9:09 PM

## 2015-08-28 NOTE — Progress Notes (Signed)
DAR NOTE: Patient is loud and intrusive.  Verbal outburst at times on the unit.  Denies pain, auditory and visual hallucinations.  Rates depression at 10, hopelessness at 10, and anxiety at 10.  Maintained on routine safety checks.  Medications given as prescribed.  Support and encouragement offered as needed.  Attended group and participated.  States goal for today is "get some rest."  Patient observed socializing with peers in the dayroom.  Received Tylenol 650 mg for complain of pain with good effect.

## 2015-08-28 NOTE — BHH Group Notes (Signed)
[facility] LCSW Group Therapy  08/28/2015  1:05 PM  Type of Therapy:  Group therapy  Participation Level:  Active  Participation Quality:  Attentive  Affect:  Flat  Cognitive:  Oriented  Insight:  Limited  Engagement in Therapy:  Limited  Modes of Intervention:  Discussion, Socialization  Summary of Progress/Problems:  Chaplain was here to lead a group on themes of hope and courage. Not invited due too loud, disruptive behavior. [name] 08/28/2015 2:46 PM

## 2015-08-28 NOTE — BHH Group Notes (Signed)
[facility] LCSW Aftercare Discharge Planning Group Note   08/28/2015 10:14 AM  Participation Quality: Not invited to group due to impulsivity and disruptive behaviors. Did not attend.  Stacie Daugherty
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
## 2015-08-29 DIAGNOSIS — Z8659 Personal history of other mental and behavioral disorders: Secondary | ICD-10-CM

## 2015-08-29 DIAGNOSIS — F3164 Bipolar disorder, current episode mixed, severe, with psychotic features: Principal | ICD-10-CM

## 2015-08-29 MED ORDER — OLANZAPINE 10 MG PO TABS
ORAL_TABLET | ORAL | Status: AC
Start: 2015-08-29 — End: 2015-08-29
  Administered 2015-08-29: 10 mg
  Filled 2015-08-29: qty 1

## 2015-08-29 MED ORDER — ARIPIPRAZOLE 15 MG PO TABS
15.0000 mg | ORAL_TABLET | Freq: Every evening | ORAL | Status: DC
  Administered 2015-08-29: 15 mg via ORAL
  Filled 2015-08-29 (×3): qty 1

## 2015-08-29 MED ORDER — DIVALPROEX SODIUM ER 500 MG PO TB24
1000.0000 mg | ORAL_TABLET | Freq: Every day | ORAL | Status: DC
  Administered 2015-08-31 – 2015-09-01 (×2): 1000 mg via ORAL
  Filled 2015-08-29 (×6): qty 2

## 2015-08-29 MED ORDER — OLANZAPINE 10 MG PO TBDP
10.0000 mg | ORAL_TABLET | Freq: Once | ORAL | Status: AC
  Administered 2015-08-29: 10 mg via ORAL
  Filled 2015-08-29 (×2): qty 1

## 2015-08-29 MED ORDER — QUETIAPINE FUMARATE ER 50 MG PO TB24
350.0000 mg | ORAL_TABLET | Freq: Every day | ORAL | Status: DC
  Administered 2015-08-29 – 2015-08-31 (×3): 350 mg via ORAL
  Filled 2015-08-29 (×4): qty 1

## 2015-08-29 NOTE — Progress Notes (Signed)
Stacie Daugherty has repeatedly been increasingly agitated, yelling, cursing up and down the hallway since approximately 2230. She has been displaying oppositional defiant behavior, increased intrusiveness and extreme psychosis and paranoia. She has been medicated several times including Geodon 20 mg PO, Ativan 1mg  and zyprexa ODT 10mg . Will continue to monitor Q 15 minutes for safety.

## 2015-08-29 NOTE — Progress Notes (Signed)
[facility] Group Notes:  (Nursing/MHT/Case Management/Adjunct)  Date:  08/29/2015  Time:  8:49 PM  Type of Therapy:  Psychoeducational Skills  Participation Level:  Active  Participation Quality:  Attentive  Affect:  Excited  Cognitive:  Lacking  Insight:  Lacking  Engagement in Group:  Developing/Improving  Modes of Intervention:  Education  Summary of Progress/Problems: The patient's coping skill (theme of the day) following discharge will be to watch Christian television.   Archie Balboa S 08/29/2015, 8:49 PM

## 2015-08-29 NOTE — BHH Group Notes (Signed)
[facility] Group Notes:  (Nursing/MHT/Case Management/Adjunct)  Date:  08/29/2015  Time:  11:20 AM  Type of Therapy:  Nurse Education  Participation Level:  Active  Participation Quality:  Appropriate and Attentive  Affect:  Appropriate  Cognitive:  Alert and Appropriate  Insight:  Appropriate and Good  Engagement in Group:  Engaged  Modes of Intervention:  Discussion and Education  Summary of Progress/Problems: Topic was on healthy coping skills. Discussed the importance of maintaining a healthy coping skills.  Patient encouraged to learn new coping skills that leads to a healthy lifestyle. Patient was attentive and receptive.   Mart Piggs 08/29/2015, 11:20 AM

## 2015-08-29 NOTE — Progress Notes (Signed)
D: Pt presents with animated mood and affect. Observed singing loudly X2 in hall but has been verbally redirectable without outburst thus far this shift. Pt denies SI, HI and AVH when assessed. Attended groups as scheduled. Off unit to courtyard with peers for leisure activity, returned without issues. Compliant with scheduled medications. Denies concerns at this time.  A: Support and availability provided to this pt. Medications administered as per MD's orders. Q 15 minutes safety checks maintained without self harm gestures or outburst at thus far this shift.  R: Pt remains safe on and off unit. Denies adverse drug reactions. Continue plan of care.
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
## 2015-08-29 NOTE — [facility] Group Notes (Signed)
[place] Group Notes:  (Clinical Social Work)  08/30/2015  11:15-12:00PM  Summary of Progress/Problems:   Today's process group involved patients discussing something they do that gets in the way of them living the life they want.  Pt was present, but asleep and snoring loudly.  She was awakened multiple times but immediately fell back asleep.  She refused to return to her room.  Type of Therapy:  Group Therapy - Process  Participation Level:  None  Participation Quality:  Drowsy  Affect:  Flat  Cognitive:  Lacking  Insight:  None  Engagement in Therapy:  None  Modes of Intervention:  Exploration, Discussion  Selmer Dominion, LCSW 08/30/2015, 8:13 AM

## 2015-08-30 MED ORDER — ARIPIPRAZOLE 10 MG PO TABS
20.0000 mg | ORAL_TABLET | Freq: Every evening | ORAL | Status: DC
  Administered 2015-08-30 – 2015-08-31 (×2): 20 mg via ORAL
  Filled 2015-08-30 (×4): qty 2

## 2015-08-30 NOTE — Progress Notes (Signed)
Patient ID: Stacie Daugherty, female   DOB: 1961-03-18, 55 y.o.   MRN: [id number] D: Patient appeared calm and cooperative. Pt excited to see husband and son visit tonight. Pt refuse scheduled Depakote stating not taking at home and does not take medication she is not used to. Pt has bilateral pedal edema. Denies  SI/HI/AVH.No behavioral issues noted.  A: Support and encouragement offered as needed. Medications administered as prescribed. Pt advised to elevate legs when sleeping. R: Patient cooperative and appropriate on unit. Will continue to monitor patient for safety and stability.

## 2015-08-30 NOTE — B[facility] Group Notes (Signed)
Adult Psychoeducational Group Note  Date:  08/30/2015 Time:  8:51 PM  Group Topic/Focus:  Wrap-Up Group:   The focus of this group is to help patients review their daily goal of treatment and discuss progress on daily workbooks.  Participation Level:  Active  Participation Quality:  Redirectable  Affect:  Appropriate  Cognitive:  Appropriate  Insight: Good  Engagement in Group:  Engaged  Modes of Intervention:  Discussion  Additional Comments:  Pt stated her day was good and rated it a 10.  She has visits from her family and friends.  She stated she went outside, played basketball and football.  Her goal was to be at peace.  She has no discharge plans as of yet.  Victorino Sparrow A 08/30/2015, 8:51 PM
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
## 2015-08-30 NOTE — [facility] Group Notes (Signed)
[place] Group Notes:  (Clinical Social Work)  08/30/2015  11:00AM-12:00PM  Summary of Progress/Problems:  The main focus of today's process group was to listen to a variety of genres of music and to identify that different types of music provoke different responses.  The patient then was able to identify personally what was soothing for them, as well as energizing, as well as how patient can personally use this knowledge in sleep habits, with depression, and with other symptoms.  The patient expressed at the beginning of group the overall feeling of "happy but bored."  She danced and sang a lot in group, moved around the room a lot and had to be redirected from touching others.  Type of Therapy:  Music Therapy   Participation Level:  Active  Participation Quality:  Attentive and Sharing  Affect:  Not Congruent  Cognitive:  Disorganized  Insight:  Limited  Engagement in Therapy:  Engaged  Modes of Intervention:   Activity, Exploration  Selmer Dominion, LCSW 08/30/2015

## 2015-08-30 NOTE — Progress Notes (Signed)
Patient is pleasant this morning, she stated that she was able to sleep good last night without requesting pain medication. She stated that appetite was good for the past 24 hours, she stated that her energy was normal, and she stated that her concentration was good.She rated her depression at a 5, hopelessness at a 6, and her anxiety at 4. She stated that she is not feeling withdrawal today.  She denies SI, HI, or AVH. She stated is having physical pain, but cannot specify where and stated that her pain is at a 7/10.  She stated that her goal today is to keep calm and singing. She stated that "child" will help her meet that goal.  She stated that she would like to tell the staff "thank you." She stated that she has been working on her discharge plan.   Patient remains safe through q15 min observation. She is offered encouragement and support through and staff. Medications administered.   Patient is compliant and safe at this time. Will continue to monitor and keep safe.

## 2015-08-30 NOTE — Progress Notes (Signed)
Stacie Daugherty is quieter and calmer this evening. She is not displaying any signs of anger, irritability or agitation. Denies SI/HI/AVH. Contracts for safety. Encouragement and support given. Medications administered as prescribed. Continue to monitor Q 15 minutes for patient safety and medication effectiveness.
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
## 2015-08-31 NOTE — Progress Notes (Signed)
Did not attend group 

## 2015-08-31 NOTE — Plan of Care (Signed)
Problem: Ineffective individual coping Goal: STG: Patient will remain free from self harm Outcome: Progressing Pt is safe and free from self harm     

## 2015-08-31 NOTE — Progress Notes (Addendum)
D: Pt is more redirectable thus far this shift. Presents with animated affect and bright mood. Pt fell outside in courtyard during recreational hour while playing football at approximately 1145 this shift. Per pt "I caught myself with both hands, I forgot I did not have my shoe laces on, was running and I tripped ". Denies pain, oriented to self, place, time and event when assessed. Vitals done and WNL. Pt rated her depression 5/10, hopelessness 2/10 and anxiety 3/10. Per report, pt slept well last night. Appetite good with poor concentration level. Pt's goal for today is to "chill" which she plans to attain by "taking it easy". Pt compliant with medications as ordered when offered. Pt excused from groups due to intrusiveness and loud laughter and was cooperative.  A: Medications administered as prescribed, including PRN Ativan for anxiety. Fall assessment including neuro check completed as per protocol. May, assigned NP informed of fall event by writer and pt was assessed. Verbal education done on safety when off unit (gym or courtyard) on recreational hours. Pt reminded about her limitations. Support, availability and encouragement provided to this pt. Q 15 minutes checks maintained for safety.  R: Pt receptive to care. Verbalized understanding related to safety needs and limitations. Denies adverse drug reactions. Will continue to monitor for safety and mood stability.

## 2015-08-31 NOTE — BHH Group Notes (Signed)
[place] LCSW Group Therapy  08/31/2015 1:15 pm  Type of Therapy: Process Group Therapy  Participation Level:  Active  Participation Quality:  Appropriate  Affect:  Flat  Cognitive:  Oriented  Insight:  Improving  Engagement in Group:  Limited  Engagement in Therapy:  Limited  Modes of Intervention:  Activity, Clarification, Education, Problem-solving and Support  Summary of Progress/Problems: Today's group addressed the issue of overcoming obstacles.  Patients were asked to identify their biggest obstacle post d/c that stands in the way of their on-going success, and then problem solve as to how to manage this.  Was removed from group prior to the start due to intrusive behavior that gets on the nerves of peers.  [name] 08/31/2015   3:31 PM

## 2015-09-01 MED ORDER — QUETIAPINE FUMARATE ER 300 MG PO TB24
300.0000 mg | ORAL_TABLET | Freq: Every day | ORAL | Status: DC
  Administered 2015-09-01 – 2015-09-02 (×2): 300 mg via ORAL
  Filled 2015-09-01 (×4): qty 1

## 2015-09-01 MED ORDER — ARIPIPRAZOLE 15 MG PO TABS
25.0000 mg | ORAL_TABLET | Freq: Every evening | ORAL | Status: DC
  Administered 2015-09-01 – 2015-09-06 (×6): 25 mg via ORAL
  Filled 2015-09-01 (×8): qty 1

## 2015-09-01 NOTE — BHH Group Notes (Signed)
[place] LCSW Group Therapy  09/01/2015 , 2:44 PM   Type of Therapy:  Group Therapy  Participation Level:  Active  Participation Quality:  Attentive  Affect:  Appropriate  Cognitive:  Alert  Insight:  Improving  Engagement in Therapy:  Engaged  Modes of Intervention:  Discussion, Exploration and Socialization  Summary of Progress/Problems: Today's group focused on the term Diagnosis.  Participants were asked to define the term, and then pronounce whether it is a negative, positive or neutral term.  Was not invited.  Stacie Daugherty B 09/01/2015 , 2:44 PM

## 2015-09-01 NOTE — Progress Notes (Signed)
Adult Psychoeducational Group Note  Date:  09/01/2015 Time:  8:41 PM  Group Topic/Focus:  Wrap-Up Group:   The focus of this group is to help patients review their daily goal of treatment and discuss progress on daily workbooks.  Participation Level:  Active  Participation Quality:  Appropriate  Affect:  Appropriate  Cognitive:  Alert  Insight: Appropriate  Engagement in Group:  Engaged  Modes of Intervention:  Discussion  Additional Comments:  Patient goal for today was to not get kicked out of group. Patient stated they did not have any groups today. Patient stated she had a good time outside. On a scale between 1-10, (1=worse, 10=best) patient rated her day a 6 because "my son did not come to visit me".  Stacie Daugherty 09/01/2015, 8:41 PM

## 2015-09-01 NOTE — Tx Team (Signed)
Interdisciplinary Treatment Plan Update (Adult)  Date:  09/01/2015 Time Reviewed:  8:25 AM  Progress in Treatment: Attending groups: Yes. Participating in groups: Yes. Taking medication as prescribed:  Yes. Tolerating medication:  Yes. Family/Significant other contact made:  No Patient understands diagnosis:  No, limited insight. Discussing patient identified problems/goals with staff:  Yes, see initial care plan. Medical problems stabilized or resolved:  Yes Denies suicidal/homicidal ideation: Yes. Issues/concerns per patient self-inventory: No. Other:  New problem(s) identified:  Discharge Plan or Barriers: See below   Reason for Continuation of Hospitalization: Depression Hallucinations Mania Medication stabilization  Comments: Stacie Daugherty is a 55 y.o. AA female, married , lives with her husband in [place], has a hx of Bipolar disorder, HTN, chronic pedal edema, recent breast lump removal, who presented to [facility] after being petitioned by her husband for involuntary commitment for worsening manic behavior at home. Klonopin, Lamictal, Seroquel, Desyrel  09/01/15:  Pt continues to be labile, manic. Will continue Lamictal 25 mg po bid for mood sx. Will continue Depakote ER 1000 mg po qhs along with Lamictal. Pt is currently very manic. Depakote level on 09/02/15. Will reduce Seroquel to 300 mg po qhs for psychosis .Cross titrate with Abilify. Will increase Abilify to 25 mg po qpm for psychosis/mood sx. Will continue Trazodone 100 mg po qhs prn for sleep. Will continue Restoril 30 mg po qhs  Changed Gabapentin to 300 mg po tid for anxiety/restlessness. Will continue Ativan to 1 mg po bid for anxiety sx. Will continue Bactrim as scheduled for her S/P Breast lump aspiration. Will make available PRN medications as per agitation protocol.  Estimated length of stay: 4-5 days  New goal(s):  Review of initial/current patient goals per problem list:  1. Goal(s): Patient will  participate in aftercare plan  Met: Yes   Target date: at discharge  As evidenced by: Patient will participate within aftercare plan AEB aftercare provider and housing plan at discharge being identified.  08/26/15: Pt will return home and follow-up with [facility].    6. Goal (s): Patient will demonstrate decreased signs of mania  * Met: No  * Target date: 3-5 days post admission date  * As evidenced by: Patient demonstrate decreased signs of mania AEB decreased mood instability and demonstration of stable mood   08/26/15: Pt is sexually inappropriate, intrusive, loud  and  presents with pressured speech. 09/01/15:  Not as overt, but continues to be intrusive, loud, disruptive  Attendees: Patient:  09/01/2015 8:25 AM  Family:   09/01/2015 8:25 AM  Physician:  Dr. Ursula Alert, MD 09/01/2015 8:25 AM  Nursing: Patrecia Pace RN  09/01/2015 8:25 AM  Case Manager:  Roque Lias, LCSW 09/01/2015 8:25 AM  Counselor:  Matthew Saras, MSW Intern 09/01/2015 8:25 AM  Other:   09/01/2015 8:25 AM  Other:   09/01/2015 8:25 AM  Other:   09/01/2015 8:25 AM  Other:  09/01/2015 8:25 AM  Other:    Other:    Other:    Other:    Other:    Other:      Scribe for Treatment Team:   Ripley Fraise  09/01/2015 8:25 AM

## 2015-09-01 NOTE — Progress Notes (Signed)
Patient ID: Stacie Daugherty, female   DOB: [birth year], 55 y.o.   MRN: [id number] D: Patient appeared calm and cooperative. Denies SI/HI/AVH.No behavioral issues noted.  A: Support and encouragement offered as needed. Medications administered as prescribed.  R: Patient cooperative on unit. Will continue to monitor patient for safety and stability.
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
## 2015-09-01 NOTE — Progress Notes (Signed)
Patient ID: Stacie Daugherty, female   DOB: 06-14-1960, 55 y.o.   MRN: [id number] D: Patient sad about son not visiting. Pt angry with husband for not for not bringing son to visit. Pt continue to be labile and intrusive but redirectable. Denies SI/HI/AVH.  A: Support and encouragement offered as needed. Medications administered as prescribed.  R: Patient cooperative on unit. Will continue to monitor patient for safety and stability.
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
## 2015-09-02 LAB — VALPROIC ACID LEVEL: Valproic Acid Lvl: 57 ug/mL (ref 50.0–100.0)

## 2015-09-02 MED ORDER — DIVALPROEX SODIUM ER 500 MG PO TB24
1250.0000 mg | ORAL_TABLET | Freq: Every day | ORAL | Status: DC
  Administered 2015-09-02 – 2015-09-06 (×5): 1250 mg via ORAL
  Filled 2015-09-02 (×7): qty 1
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
## 2015-09-02 NOTE — Progress Notes (Signed)
DAR NOTE: Pt present with flat affect and depressed mood in the unit. Pt has been visible in the milieu interacting with staff and peers. Pt denies physical pain, took all her meds as scheduled. As per self inventory, pt had a fair night sleep, good appetite, low energy, and good concentration. Pt rate depression at 5, hopeless ness at 3, and anxiety at 3. Pt's gaol for today is" to stay in the group and getting out of as soon as possible." Pt's safety ensured with 15 minute and environmental checks. Pt currently denies SI/HI and A/V hallucinations. Pt verbally agrees to seek staff if SI/HI or A/VH occurs and to consult with staff before acting on these thoughts. Will continue POC.

## 2015-09-02 NOTE — BHH Group Notes (Signed)
[facility] LCSW Group Therapy  09/02/2015 1:39 PM  Type of Therapy: Group Therapy  Participation Level: Active  Participation Quality: Attentive  Affect: Flat  Cognitive: Oriented  Insight: Limited  Engagement in Therapy: Engaged  Modes of Intervention: Discussion and Socialization  Summary of Progress/Problems: [name] from the [facility] was here to tell his story of recovery and play his guitar.  Present for the whole group. Slept for most of the time.  Kara Mead. Marshell Levan 09/02/2015 1:39 PM

## 2015-09-02 NOTE — BHH Group Notes (Signed)
[facility] LCSW Aftercare Discharge Planning Group Note   09/02/2015 1:06 PM  Participation Quality: Active   Mood/Affect:  Lethargic  Depression Rating:    Anxiety Rating:    Thoughts of Suicide:  No Will you contract for safety?   NA  Current AVH:  Yes  Plan for Discharge/Comments: Pt states that she is feeling very sleepy today because of her meds. Dozed off a few times during group. Pt reports she is unhappy with the meds she is currently on and does not like the feeling of being "slowed down". Had a visit with her family which went well. Following up with [facility].  Transportation Means:   Supports: Family and mental health providers.  Georga Kaufmann

## 2015-09-03 MED ORDER — QUETIAPINE FUMARATE ER 200 MG PO TB24
200.0000 mg | ORAL_TABLET | Freq: Every day | ORAL | Status: DC
  Administered 2015-09-03 – 2015-09-06 (×4): 200 mg via ORAL
  Filled 2015-09-03 (×3): qty 1
  Filled 2015-09-03: qty 7
  Filled 2015-09-03 (×2): qty 1

## 2015-09-03 NOTE — Progress Notes (Signed)
DAR: Pt present with jovial and bright mood today. Pt has been visible in the milieu and interacting well with peers and staff. Pt attended group and participated. As [per self inventory, pt had a good night, good appetite, normal energy, and poor concentration. Pt rate depression at 2, hopelessness at 3, and anxiety at 7. Pt's goal is " getting some rest." Pt's safety ensured with 15 minute and environmental checks. Pt currently denies SI/HI and A/V hallucinations. Pt verbally agrees to seek staff if SI/HI or A/VH occurs and to consult with staff before acting on these thoughts. Will continue POC.

## 2015-09-03 NOTE — Progress Notes (Signed)
D: Pt presents flat in affect and depressed in mood. Pt's speech was soft.  Pt presented with a decrease in her typical activity level. Pt's family was unable to visit with her tonight (primary factor to her mood).  Pt remained in her room for the majority of the evening. Pt denied any SI/HI/AVH. Pt is compliant with her current POC.  A: Writer administered scheduled medications to pt, per MD orders. Indications verbalized. Continued support and availability as needed was extended to this pt. Staff continues to monitor pt with q32min checks.  R: No adverse drug reactions noted. Pt receptive to treatment. Pt remains safe at this time.

## 2015-09-03 NOTE — Progress Notes (Signed)
Recreation Therapy Notes  04.05.2017 LRT returned to work with patient 1:1. LRT reviewed deep breathing technique with patient, patient reported she liked it, but was unable to identify how she could apply the technique at home. Patient focused on having more basketballs to use during admission and stated she was going to call the state to report the lack of recreation equipment. LRT assured patient she would look into getting more basketballs, patient appeased by this. Patient denied need for additional stress management techniques.   Laureen Ochs Edan Juday, LRT/CTRS    Lane Hacker 09/03/2015 4:24 PM
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
## 2015-09-03 NOTE — BHH Group Notes (Signed)
[facility] Group Notes:  (Nursing/MHT/Case Management/Adjunct)  Date:  09/03/2015  Time:  3:42 PM   Type of Therapy:  Psychoeducational Skills  Participation Level:  Active  Participation Quality:  Appropriate  Affect:  Appropriate  Cognitive:  Appropriate  Insight:  Appropriate  Engagement in Group:  Engaged  Modes of Intervention:  Problem-solving  Summary of Progress/Problems: Summary of Progress/Problems: Topic was on leisure and lifestyle changes. Discussed the important of choosing healthy leisure activities. Group encouraged to surround themselves with positive and healthy group/support system when changing to a healthy life style.   Stacie Daugherty 09/03/2015, 3:42 PM

## 2015-09-03 NOTE — BHH Group Notes (Signed)
[facility] Group Notes:  (Counselor/Nursing/MHT/Case Management/Adjunct)  09/03/2015 1:15PM  Type of Therapy:  Group Therapy  Participation Level:  Active  Participation Quality:  Appropriate  Affect:  Flat  Cognitive:  Oriented  Insight:  Improving  Engagement in Group:  Limited  Engagement in Therapy:  Limited  Modes of Intervention:  Discussion, Exploration and Socialization  Summary of Progress/Problems: The topic for group was balance in life.  Pt participated in the discussion about when their life was in balance and out of balance and how this feels.  Pt discussed ways to get back in balance and short term goals they can work on to get where they want to be. Called out the hypocrites in church, following the lead of a religiously preoccupies peer.  Cited the fact that she is dealing with medication OK and trusting the Dr as the reason she is "partially balanced."  Cited the fact that she was trying to get her husband kicked out of the home prior to admission as the reason she knows she was unbalanced prior to coming in.   Brought up her mother, which she generally does in group.   [name] 09/03/2015 12:55 PM

## 2015-09-04 MED ORDER — FUROSEMIDE 40 MG PO TABS
40.0000 mg | ORAL_TABLET | Freq: Every day | ORAL | Status: DC
Start: 2015-09-04 — End: 2015-09-08
  Administered 2015-09-04 – 2015-09-07 (×4): 40 mg via ORAL
  Filled 2015-09-04: qty 1
  Filled 2015-09-04: qty 7
  Filled 2015-09-04 (×4): qty 1
  Filled 2015-09-04: qty 2

## 2015-09-04 MED ORDER — ARIPIPRAZOLE ER 400 MG IM SUSR
400.0000 mg | INTRAMUSCULAR | Status: DC
  Filled 2015-09-04: qty 400

## 2015-09-04 MED ORDER — FUROSEMIDE 20 MG PO TABS: 20.0000 mg | ORAL_TABLET | Freq: Every day | ORAL | Status: DC

## 2015-09-04 NOTE — BHH Group Notes (Signed)
[facility] LCSW Aftercare Discharge Planning Group Note   09/04/2015 12:50 PM  Participation Quality: Active   Mood/Affect:  Inappropriate   Depression Rating:    Anxiety Rating:    Thoughts of Suicide:  No Will you contract for safety?   NA  Current AVH:  Yes  Plan for Discharge/Comments: Pt presents as intrusive and inappropriate. While peers were saying positive comments about one another pt blurted out "Claiborne Billings is two-faced". Her comment was ignored and group was able to continue. Pt reports that her family came to visit last night and it went well. Will follow up with Dr. Josph Macho at [facility].  Transportation Means: Family  Supports: Family and mental health supports   Georga Kaufmann

## 2015-09-04 NOTE — Tx Team (Signed)
Interdisciplinary Treatment Plan Update (Adult)  Date:  09/04/2015 Time Reviewed:  2:42 PM  Progress in Treatment: Attending groups: Yes. Participating in groups: Yes. Taking medication as prescribed:  Yes. Tolerating medication:  Yes. Family/Significant other contact made:  No Patient understands diagnosis:  No, limited insight. Discussing patient identified problems/goals with staff:  Yes, see initial care plan. Medical problems stabilized or resolved:  Yes Denies suicidal/homicidal ideation: Yes. Issues/concerns per patient self-inventory: No. Other:  New problem(s) identified:  Discharge Plan or Barriers: See below   Reason for Continuation of Hospitalization: Depression Hallucinations Mania Medication stabilization  Comments: Stacie Daugherty is a 54 y.o. AA female, married , lives with her husband in [place], has a hx of Bipolar disorder, HTN, chronic pedal edema, recent breast lump removal, who presented to [facility] after being petitioned by her husband for involuntary commitment for worsening manic behavior at home. Klonopin, Lamictal, Seroquel, Desyrel  09/01/15:  Pt continues to be labile, manic. Will continue Lamictal 25 mg po bid for mood sx. Will continue Depakote ER 1000 mg po qhs along with Lamictal. Pt is currently very manic. Depakote level on 09/02/15. Will reduce Seroquel to 300 mg po qhs for psychosis .Cross titrate with Abilify. Will increase Abilify to 25 mg po qpm for psychosis/mood sx. Will continue Trazodone 100 mg po qhs prn for sleep. Will continue Restoril 30 mg po qhs  Changed Gabapentin to 300 mg po tid for anxiety/restlessness. Will continue Ativan to 1 mg po bid for anxiety sx. Will continue Bactrim as scheduled for her S/P Breast lump aspiration. Will make available PRN medications as per agitation protocol.  09/04/15: Will continue Lamictal 25 mg po bid for mood sx. Will continue Depakote ER to 1250 mg po qhs along with Lamictal. Pt is currently very  manic. Depakote level on 09/02/15- 57 , repeat Depakote level on 09/06/15. Reduced Seroquel to 200 mg po qhs for psychosis .Cross titrate with Abilify. Will continue Abilify 25 mg po qpm for psychosis/mood sx.Will provide Abilify Maintena IM 400 mg - prior to DC.  Estimated length of stay: 2-4 days  New goal(s):  Review of initial/current patient goals per problem list:  1. Goal(s): Patient will participate in aftercare plan  Met: Yes   Target date: at discharge  As evidenced by: Patient will participate within aftercare plan AEB aftercare provider and housing plan at discharge being identified.  08/26/15: Pt will return home and follow-up with [facility].    6. Goal (s): Patient will demonstrate decreased signs of mania  * Met: No  * Target date: 3-5 days post admission date  * As evidenced by: Patient demonstrate decreased signs of mania AEB decreased mood instability and demonstration of stable mood   08/26/15: Pt is sexually inappropriate, intrusive, loud  and  presents with pressured speech. 09/01/15:  Not as overt, but continues to be intrusive, loud, disruptive 09/04/15:  Symptoms persist   Attendees: Patient:  09/04/2015 2:42 PM  Family:   09/04/2015 2:42 PM  Physician:  Dr. Saramma Eappen, MD 09/04/2015 2:42 PM  Nursing: Ronnie  RN  09/04/2015 2:42 PM  Case Manager:   , LCSW 09/04/2015 2:42 PM  Counselor:  Lynn Bryant, MSW Intern 09/04/2015 2:42 PM  Other:   09/04/2015 2:42 PM  Other:   09/04/2015 2:42 PM  Other:   09/04/2015 2:42 PM  Other:  09/04/2015 2:42 PM  Other:    Other:    Other:    Other:    Other:    Other:        Scribe for Treatment Team:   Rod   09/04/2015 2:42 PM   

## 2015-09-04 NOTE — BHH Group Notes (Signed)
She attend group. Her day was a 5. Her day was peaceful. She did not meet her goal  She ready to go home now.

## 2015-09-04 NOTE — Plan of Care (Signed)
Problem: Ineffective individual coping Goal: STG: Patient will remain free from self harm Outcome: Progressing Pt safe on the unit at this time     

## 2015-09-04 NOTE — Progress Notes (Signed)
Adult Psychoeducational Group Note  Date:  09/04/2015 Time:  8:35 PM  Group Topic/Focus:  Wrap-Up Group:   The focus of this group is to help patients review their daily goal of treatment and discuss progress on daily workbooks.  Participation Level:  Active  Participation Quality:  Appropriate  Affect:  Anxious  Cognitive:  Appropriate  Insight: Good  Engagement in Group:  Engaged  Modes of Intervention:  Activity  Additional Comments:  Patient rated her day a 4. Goal is to stay out of trouble and get ready to go home.  Donato Heinz 09/04/2015, 8:35 PM

## 2015-09-04 NOTE — Progress Notes (Signed)
Recreation Therapy Notes  04.07.2017. Patient has c/o of not having enough recreation equipment during admission and is concerned about being admitted through the weekend due to not having enough to keep her occupied. MD additionally relayed patient concerns to LRT. LRT able to locate and provide a 300 piece puzzle and a deck of cards for patient to use over the weekend. Patient instructed to complete the puzzle of the weekend.   Laureen Ochs Latese Dufault, LRT/CTRS    Wing Gfeller L 09/04/2015 7:25 PM
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
## 2015-09-04 NOTE — Progress Notes (Signed)
Patient has been intrusive needed reminding of boundaries.  Patient has been smiling, singing and pacing the halls.  Patient often inserts herself into others conversations and offers advice when not asked.  Patient denies SI, HI and avh   Assess patient for safety, offer medications as prescribed, engage patient in 1:1 staff talks,   R  Patient able to contract for safety.

## 2015-09-05 NOTE — Progress Notes (Signed)
Adult Psychoeducational Group Note  Date:  09/05/2015 Time:  9:16 PM  Group Topic/Focus:  Wrap-Up Group:   The focus of this group is to help patients review their daily goal of treatment and discuss progress on daily workbooks.  Participation Level:  Active  Participation Quality:  Appropriate  Affect:  Appropriate and Flat  Cognitive:  Appropriate  Insight: Good and Improving  Engagement in Group:  Improving  Modes of Intervention:  Discussion  Additional Comments:  Patient stated the highlight of her day was a visit from her family, states she would like to "work on getting out of here tomorrow or at least by Monday".   Orpah Greek 09/05/2015, 9:16 PM
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
## 2015-09-05 NOTE — Progress Notes (Signed)
Patient ID: Stacie Daugherty, female   DOB: [birth year], 55 y.o.   MRN: [id number]   D: Pt has been very flat on the unit today. Pt reported that she thought she was on too much medication. Pt reported that she use to be on Neurontin 100mg  three times a day, now she is on 300mg . Pt reported that she felt better and knows that she needed the medication since she was in the bed for 4 months. Pt reported that her depression was a 4, her hopelessness was a a 3, and her anxiety was a 6. Pt reported that her gaol wads to get ready for discharge. Pt reported being negative SI/HI, no AH/VH noted. A: 15 min checks continued for patient safety. R: Pt safety maintained.

## 2015-09-05 NOTE — BHH Group Notes (Signed)
[place] Group Notes:  (Clinical Social Work)  09/05/2015  11:15-12:00PM  Summary of Progress/Problems:   Today's process group involved patients discussing one thing they do that keeps them from living the life they want.  This was initially a difficult topic for most patients to understand and was both written on the whiteboard and explained in several ways until understood. The patient expressed that she stops her medications because she believes God can heal her, and ends up in the hospital.  She digressed into a lengthy story about her mother being hospitalized at [facility] and needing twice weekly visits from pt and how stressful that was.  She was not staying within the scope of the discussion, but was willing to be redirected.  Eventually she started asking other group members questions that were within the scope of the discussion, and was actually helpful in directing other group members toward possible solutions to the issues they brought up.  Type of Therapy:  Group Therapy - Process  Participation Level:  Active  Participation Quality:  Attentive and Sharing  Affect:  Blunted  Cognitive:  Appropriate  Insight:  Improving  Engagement in Therapy:  Engaged  Modes of Intervention:  Exploration, Discussion  Selmer Dominion, LCSW 09/05/2015, 12:41 PM

## 2015-09-05 NOTE — Progress Notes (Signed)
D: Pt is very flat; complained of mild depression and moderate anxiety; she states, "This place makes me feel this way." Pt lacks basic insight; blamed everyone from her husband, to the doctors and nurses at one point or the other during shift assessment; "I shouldn't be here in the first place, my husband committed me; the doctor changed some of my medicine that messed me up; the nurse lied to me, she said I wasn't getting Seroquel anymore." Pt denies pain, SI, HI or AVH. A: Medications offered as prescribed.  Support, encouragement, and safe environment provided.  15-minute safety checks continue. R: Pt was med compliant.  Pt attended wrap-up group. Safety checks continue

## 2015-09-06 LAB — VALPROIC ACID LEVEL: Valproic Acid Lvl: 93 ug/mL (ref 50.0–100.0)

## 2015-09-06 NOTE — BHH Group Notes (Signed)
[place] Group Notes:  (Clinical Social Work)  09/06/2015  11:00AM-12:00PM  Summary of Progress/Problems:  The main focus of today's process group was to listen to a variety of genres of music and to identify that different types of music provoke different responses.  The patient then was able to identify personally what was soothing for them, as well as energizing, as well as how patient can personally use this knowledge in sleep habits, with depression, and with other symptoms.  The patient was late to group, did not dance or sing in group as she has previously done, but had a smile and appeared to enjoy the music.  She interacted pleasantly with CSW and other patients.  Type of Therapy:  Music Therapy   Participation Level:  Active  Participation Quality:  Attentive   Affect:  Blunted  Cognitive:  Disorganized  Insight:  Improving  Engagement in Therapy:  Improving  Modes of Intervention:   Activity, Exploration  Selmer Dominion, LCSW 09/06/2015
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
## 2015-09-06 NOTE — Progress Notes (Signed)
D: Pt is very flat; complained of mild depression and mild anxiety with some worrying; she states, "I think I will be leaving on Monday, I'm a little anxious about that; I'm still a little depressed, I don't think that is going to change; My Depakote is working for me, I think but I may not be able to afford it when I go home." Pt denies pain, SI, HI or AVH. A: Medications offered as prescribed.  Support, encouragement, and safe environment provided.  15-minute safety checks continue. R: Pt was med compliant.  Pt attended wrap-up group. Safety checks continue

## 2015-09-06 NOTE — Progress Notes (Signed)
Patient ID: Stacie Daugherty, female   DOB: 24-Aug-1960, 55 y.o.   MRN: [id number] D: Patient rates depression as 4 on 0-10 scale. Pt reports she is on too much medication but states " It should be working because I'm not lying in bed". Pt observed watching TV and interacting well with peers. Denies  SI/HI/AVH and pain.No behavioral issues noted.  A: Support and encouragement offered as needed. Medications administered as prescribed.  R: Patient cooperative and appropriate on unit. Will continue to monitor patient for safety and stability.

## 2015-09-06 NOTE — Progress Notes (Signed)
Patient ID: Stacie Daugherty, female   DOB: Nov 22, 1960, 55 y.o.   MRN: [id number]   D: Pt continues to be very flat on the unit today. Pt continues to report that she thought she was on too much medication. Pt reported that she use to be on Neurontin 100mg  three times a day, now she is on 300mg . Pt wanted to know when the doctor was going to back off her medications so that she could go home. Pt reported that her depression was a 4, her hopelessness was a a 3, and her anxiety was a 6. Pt reported that her goal was to get ready for discharge on Monday. Pt reported being negative SI/HI, no AH/VH noted. A: 15 min checks continued for patient safety. R: Pt safety maintained.

## 2015-09-06 NOTE — Progress Notes (Signed)
Adult Psychoeducational Group Note  Date:  09/06/2015 Time:  9:20 PM  Group Topic/Focus:  Wrap-Up Group:   The focus of this group is to help patients review their daily goal of treatment and discuss progress on daily workbooks.  Participation Level:  Active  Participation Quality:  Appropriate and Attentive  Affect:  Appropriate  Cognitive:  Appropriate  Insight: Appropriate  Engagement in Group:  Engaged  Modes of Intervention:  Discussion  Additional Comments:  Pt rated her day a 7 out of 10. Pt was upset her family didn't visit her today. Pt goal for tomorrow is to work on discharge planning and get out of here tomorrow or early Tuesday.   Jerline Pain 09/06/2015, 9:20 PM

## 2015-09-07 ENCOUNTER — Ambulatory Visit: Payer: Self-pay | Admitting: Family Medicine

## 2015-09-07 MED ORDER — TRIAMCINOLONE ACETONIDE 0.1 % EX CREA: 1.0000 "application " | TOPICAL_CREAM | Freq: Two times a day (BID) | CUTANEOUS | Status: DC

## 2015-09-07 MED ORDER — LAMOTRIGINE 25 MG PO TABS: 25.0000 mg | ORAL_TABLET | Freq: Two times a day (BID) | ORAL | Status: DC

## 2015-09-07 MED ORDER — FUROSEMIDE 40 MG PO TABS: 40.0000 mg | ORAL_TABLET | Freq: Every day | ORAL | Status: DC

## 2015-09-07 MED ORDER — CARVEDILOL 3.125 MG PO TABS: 3.1250 mg | ORAL_TABLET | Freq: Every day | ORAL | Status: DC

## 2015-09-07 MED ORDER — DIVALPROEX SODIUM ER 250 MG PO TB24: 1250.0000 mg | ORAL_TABLET | Freq: Every day | ORAL | Status: DC

## 2015-09-07 MED ORDER — ARIPIPRAZOLE ER 400 MG IM SUSR
400.0000 mg | INTRAMUSCULAR | Status: DC
  Administered 2015-09-07: 400 mg via INTRAMUSCULAR

## 2015-09-07 MED ORDER — ARIPIPRAZOLE 5 MG PO TABS: 25.0000 mg | ORAL_TABLET | Freq: Every evening | ORAL | Status: DC

## 2015-09-07 MED ORDER — DIVALPROEX SODIUM 250 MG PO DR TAB: DELAYED_RELEASE_TABLET | ORAL | Status: DC

## 2015-09-07 MED ORDER — TEMAZEPAM 30 MG PO CAPS: 30.0000 mg | ORAL_CAPSULE | Freq: Every day | ORAL | Status: DC

## 2015-09-07 MED ORDER — DIVALPROEX SODIUM 500 MG PO DR TAB
500.0000 mg | DELAYED_RELEASE_TABLET | Freq: Every day | ORAL | Status: DC
  Administered 2015-09-07: 500 mg via ORAL
  Filled 2015-09-07 (×3): qty 1

## 2015-09-07 MED ORDER — AMLODIPINE BESYLATE 5 MG PO TABS: 5.0000 mg | ORAL_TABLET | Freq: Every day | ORAL | Status: DC

## 2015-09-07 MED ORDER — ARIPIPRAZOLE ER 400 MG IM SUSR: 400.0000 mg | INTRAMUSCULAR | Status: DC

## 2015-09-07 MED ORDER — GABAPENTIN 300 MG PO CAPS: 300.0000 mg | ORAL_CAPSULE | ORAL | Status: AC

## 2015-09-07 MED ORDER — DIVALPROEX SODIUM 500 MG PO DR TAB
500.0000 mg | DELAYED_RELEASE_TABLET | Freq: Every day | ORAL | Status: DC
  Filled 2015-09-07 (×2): qty 1

## 2015-09-07 MED ORDER — DIVALPROEX SODIUM 250 MG PO DR TAB
250.0000 mg | DELAYED_RELEASE_TABLET | Freq: Every day | ORAL | Status: DC
  Filled 2015-09-07: qty 1
  Filled 2015-09-07 (×2): qty 35

## 2015-09-07 MED ORDER — QUETIAPINE FUMARATE ER 200 MG PO TB24: 200.0000 mg | ORAL_TABLET | Freq: Every day | ORAL | Status: DC

## 2015-09-07 MED ORDER — ARIPIPRAZOLE 10 MG PO TABS
25.0000 mg | ORAL_TABLET | Freq: Every evening | ORAL | Status: DC
  Filled 2015-09-07: qty 21
  Filled 2015-09-07: qty 3

## 2015-09-07 MED ORDER — VITAMIN D (ERGOCALCIFEROL) 1.25 MG (50000 UNIT) PO CAPS: 50000.0000 [IU] | ORAL_CAPSULE | ORAL | Status: DC
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
## 2015-09-07 NOTE — Progress Notes (Signed)
Pt d/c from the hospital with her family. All items returned. D/C instructions given to pt and her husband. Prescriptions and samples given. Pt denies si and hi.

## 2015-09-07 NOTE — Progress Notes (Signed)
Recreation Therapy Notes  04.10.2017 approximately 3:00pm. LRT returned to check on patient progress on puzzle left with her Friday 04.07.2017. Patient reports she was not able to complete it, but that she attempted over the weekend. Patient additionally shared that she is getting ready to d/c and she is on "a lot of medication." Patient expressed she did not prefer to be on so many meds, but she was will to do what was needed for her to be well. Patient positive about pending d/c.   Laureen Ochs Kutter Schnepf, LRT/CTRS    Lane Hacker 09/07/2015 3:18 PM

## 2015-09-07 NOTE — BHH Suicide Risk Assessment (Signed)
[place] INPATIENT:  Family/Significant Other Suicide Prevention Education  Suicide Prevention Education:  Education Completed; No one has been identified by the patient as the family member/significant other with whom the patient will be residing, and identified as the person(s) who will aid the patient in the event of a mental health crisis (suicidal ideations/suicide attempt).  With written consent from the patient, the family member/significant other has been provided the following suicide prevention education, prior to the and/or following the discharge of the patient.  The suicide prevention education provided includes the following:  Suicide risk factors  Suicide prevention and interventions  National Suicide Hotline telephone number  [facility] assessment telephone number  [facility] Emergency Assistance [place] and/or Residential Mobile Crisis Unit telephone number  Request made of family/significant other to:  Remove weapons (e.g., guns, rifles, knives), all items previously/currently identified as safety concern.    Remove drugs/medications (over-the-counter, prescriptions, illicit drugs), all items previously/currently identified as a safety concern.  The family member/significant other verbalizes understanding of the suicide prevention education information provided.  The family member/significant other agrees to remove the items of safety concern listed above. The patient did not endorse SI at the time of admission, nor did the patient c/o SI during the stay here.  SPE not required.   [name] 09/07/2015, 11:04 AM

## 2015-09-07 NOTE — Tx Team (Signed)
Interdisciplinary Treatment Plan Update (Adult)  Date:  09/07/2015 Time Reviewed:  11:00 AM  Progress in Treatment: Attending groups: Yes. Participating in groups: Yes. Taking medication as prescribed:  Yes. Tolerating medication:  Yes. Family/Significant other contact made:  No Patient understands diagnosis:  No, limited insight. Discussing patient identified problems/goals with staff:  Yes, see initial care plan. Medical problems stabilized or resolved:  Yes Denies suicidal/homicidal ideation: Yes. Issues/concerns per patient self-inventory: No. Other:  New problem(s) identified:  Discharge Plan or Barriers: See below   Reason for Continuation of Hospitalization:   Comments: Stacie Daugherty is a 54 y.o. AA female, married , lives with her husband in [place], has a hx of Bipolar disorder, HTN, chronic pedal edema, recent breast lump removal, who presented to [facility] after being petitioned by her husband for involuntary commitment for worsening manic behavior at home. Klonopin, Lamictal, Seroquel, Desyrel  09/01/15:  Pt continues to be labile, manic. Will continue Lamictal 25 mg po bid for mood sx. Will continue Depakote ER 1000 mg po qhs along with Lamictal. Pt is currently very manic. Depakote level on 09/02/15. Will reduce Seroquel to 300 mg po qhs for psychosis .Cross titrate with Abilify. Will increase Abilify to 25 mg po qpm for psychosis/mood sx. Will continue Trazodone 100 mg po qhs prn for sleep. Will continue Restoril 30 mg po qhs  Changed Gabapentin to 300 mg po tid for anxiety/restlessness. Will continue Ativan to 1 mg po bid for anxiety sx. Will continue Bactrim as scheduled for her S/P Breast lump aspiration. Will make available PRN medications as per agitation protocol.  09/04/15: Will continue Lamictal 25 mg po bid for mood sx. Will continue Depakote ER to 1250 mg po qhs along with Lamictal. Pt is currently very manic. Depakote level on 09/02/15- 57 , repeat Depakote  level on 09/06/15. Reduced Seroquel to 200 mg po qhs for psychosis .Cross titrate with Abilify. Will continue Abilify 25 mg po qpm for psychosis/mood sx.Will provide Abilify Maintena IM 400 mg - prior to DC.  Estimated length of stay: D/C today  New goal(s):  Review of initial/current patient goals per problem list:  1. Goal(s): Patient will participate in aftercare plan  Met: Yes   Target date: at discharge  As evidenced by: Patient will participate within aftercare plan AEB aftercare provider and housing plan at discharge being identified.  08/26/15: Pt will return home and follow-up with [facility].    6. Goal (s): Patient will demonstrate decreased signs of mania  * Met: Yes  * Target date: 3-5 days post admission date  * As evidenced by: Patient demonstrate decreased signs of mania AEB decreased mood instability and demonstration of stable mood   08/26/15: Pt is sexually inappropriate, intrusive, loud  and  presents with pressured speech. 09/01/15:  Not as overt, but continues to be intrusive, loud, disruptive 09/04/15:  Symptoms persist 09/07/15:  Pt close enough to baseline to d/c.   Attendees: Patient:  09/07/2015 11:00 AM  Family:   09/07/2015 11:00 AM  Physician:  Dr. Saramma Eappen, MD 09/07/2015 11:00 AM  Nursing: Jan Wright  RN  09/07/2015 11:00 AM  Case Manager:   , LCSW 09/07/2015 11:00 AM  Counselor:  Lynn Bryant, MSW Intern 09/07/2015 11:00 AM  Other:   09/07/2015 11:00 AM  Other:   09/07/2015 11:00 AM  Other:   09/07/2015 11:00 AM  Other:  09/07/2015 11:00 AM  Other:    Other:    Other:    Other:      Other:    Other:      Scribe for Treatment Team:   Rod Angeni Chaudhuri  09/07/2015 11:00 AM

## 2015-09-07 NOTE — Plan of Care (Signed)
Problem: Diagnosis: Increased Risk For Suicide Attempt Goal: STG-Patient Will Comply With Medication Regime Outcome: Progressing Pt compliant with medication regime     

## 2015-09-07 NOTE — Progress Notes (Signed)
  [facility] Adult Case Management Discharge Plan :  Will you be returning to the same living situation after discharge:  Yes,  home At discharge, do you have transportation home?: Yes,  husband Do you have the ability to pay for your medications: Yes,  Mental health  Release of information consent forms completed and in the chart;  Patient's signature needed at discharge.  Patient to Follow up at: Follow-up Information    Follow up with [facility]. Go on 10/01/2015.   Specialty:  Behavioral Health   Why:  @11 :40a with Dr. Josph Macho. In the meantime please go for a walk-in appt Mon-Fri 8a-12p at your earliest convenience.    Contact information:   [place] [address] [phone]       Next level of care provider has access to [place] and Suicide Prevention discussed: Yes,  yes  Have you used any form of tobacco in the last 30 days? (Cigarettes, Smokeless Tobacco, Cigars, and/or Pipes): No  Has patient been referred to the Quitline?: N/A patient is not a smoker  Patient has been referred for addiction treatment: N/A  [name] 09/07/2015, 11:05 AM
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
## 2015-09-16 ENCOUNTER — Encounter (HOSPITAL_COMMUNITY): Payer: Self-pay | Admitting: Emergency Medicine

## 2015-09-16 ENCOUNTER — Emergency Department (HOSPITAL_COMMUNITY)
Admission: EM | Admit: 2015-09-16 | Discharge: 2015-09-22 | Disposition: A | Discharge status: Other Acute Inpatient Hospital | Payer: Medicaid Other | Attending: Emergency Medicine | Admitting: Emergency Medicine

## 2015-09-16 DIAGNOSIS — E7800 Pure hypercholesterolemia, unspecified: Secondary | ICD-10-CM | POA: Diagnosis not present

## 2015-09-16 DIAGNOSIS — I10 Essential (primary) hypertension: Secondary | ICD-10-CM | POA: Diagnosis not present

## 2015-09-16 DIAGNOSIS — F319 Bipolar disorder, unspecified: Secondary | ICD-10-CM | POA: Insufficient documentation

## 2015-09-16 DIAGNOSIS — Z01818 Encounter for other preprocedural examination: Secondary | ICD-10-CM | POA: Diagnosis not present

## 2015-09-16 DIAGNOSIS — Z87891 Personal history of nicotine dependence: Secondary | ICD-10-CM | POA: Diagnosis not present

## 2015-09-16 DIAGNOSIS — Z8659 Personal history of other mental and behavioral disorders: Secondary | ICD-10-CM

## 2015-09-16 DIAGNOSIS — Z79899 Other long term (current) drug therapy: Secondary | ICD-10-CM | POA: Diagnosis not present

## 2015-09-16 DIAGNOSIS — F3164 Bipolar disorder, current episode mixed, severe, with psychotic features: Secondary | ICD-10-CM | POA: Diagnosis not present

## 2015-09-16 DIAGNOSIS — E669 Obesity, unspecified: Secondary | ICD-10-CM | POA: Diagnosis not present

## 2015-09-16 LAB — COMPREHENSIVE METABOLIC PANEL
ALT: 32 U/L (ref 14–54)
AST: 28 U/L (ref 15–41)
Albumin: 3.9 g/dL (ref 3.5–5.0)
Alkaline Phosphatase: 55 U/L (ref 38–126)
Anion gap: 8 (ref 5–15)
BUN: 17 mg/dL (ref 6–20)
CO2: 26 mmol/L (ref 22–32)
Calcium: 8.9 mg/dL (ref 8.9–10.3)
Chloride: 104 mmol/L (ref 101–111)
Creatinine, Ser: 1.04 mg/dL — ABNORMAL HIGH (ref 0.44–1.00)
GFR calc Af Amer: >60 mL/min (ref >60)
GFR calc non Af Amer: >60 mL/min (ref >60)
Glucose, Bld: 95 mg/dL (ref 65–99)
Potassium: 3.6 mmol/L (ref 3.5–5.1)
Sodium: 138 mmol/L (ref 135–145)
Total Bilirubin: 0.6 mg/dL (ref 0.3–1.2)
Total Protein: 8.3 g/dL — ABNORMAL HIGH (ref 6.5–8.1)

## 2015-09-16 LAB — RAPID URINE DRUG SCREEN, HOSP PERFORMED
Amphetamines: NOT DETECTED
Barbiturates: NOT DETECTED
Benzodiazepines: POSITIVE — AB
Cocaine: NOT DETECTED
Opiates: NOT DETECTED
Tetrahydrocannabinol: NOT DETECTED

## 2015-09-16 LAB — CBC
HCT: 37.7 % (ref 36.0–46.0)
Hemoglobin: 13 g/dL (ref 12.0–15.0)
MCH: 31.1 pg (ref 26.0–34.0)
MCHC: 34.5 g/dL (ref 30.0–36.0)
MCV: 90.2 fL (ref 78.0–100.0)
Platelets: 280 10*3/uL (ref 150–400)
RBC: 4.18 MIL/uL (ref 3.87–5.11)
RDW: 14.6 % (ref 11.5–15.5)
WBC: 5.1 10*3/uL (ref 4.0–10.5)

## 2015-09-16 LAB — SALICYLATE LEVEL: Salicylate Lvl: <4 mg/dL (ref 2.8–30.0)

## 2015-09-16 LAB — ACETAMINOPHEN LEVEL: Acetaminophen (Tylenol), Serum: <10 ug/mL — ABNORMAL LOW (ref 10–30)

## 2015-09-16 LAB — ETHANOL: Alcohol, Ethyl (B): <5 mg/dL (ref <5)

## 2015-09-16 LAB — VALPROIC ACID LEVEL: Valproic Acid Lvl: 78 ug/mL (ref 50.0–100.0)

## 2015-09-16 MED ORDER — LAMOTRIGINE 25 MG PO TABS
25.0000 mg | ORAL_TABLET | Freq: Two times a day (BID) | ORAL | Status: DC
  Administered 2015-09-17: 25 mg via ORAL
  Filled 2015-09-16 (×3): qty 1

## 2015-09-16 MED ORDER — AMLODIPINE BESYLATE 5 MG PO TABS
5.0000 mg | ORAL_TABLET | Freq: Every day | ORAL | Status: DC
  Administered 2015-09-17 – 2015-09-22 (×6): 5 mg via ORAL
  Filled 2015-09-16 (×6): qty 1

## 2015-09-16 MED ORDER — DIPHENHYDRAMINE HCL 50 MG/ML IJ SOLN
25.0000 mg | Freq: Once | INTRAMUSCULAR | Status: AC
  Administered 2015-09-16: 25 mg via INTRAMUSCULAR
  Filled 2015-09-16: qty 1

## 2015-09-16 MED ORDER — ZIPRASIDONE MESYLATE 20 MG IM SOLR
10.0000 mg | Freq: Once | INTRAMUSCULAR | Status: AC
  Administered 2015-09-16: 10 mg via INTRAMUSCULAR

## 2015-09-16 MED ORDER — DIVALPROEX SODIUM 250 MG PO DR TAB
250.0000 mg | DELAYED_RELEASE_TABLET | Freq: Every day | ORAL | Status: DC
  Administered 2015-09-17: 250 mg via ORAL
  Filled 2015-09-16 (×2): qty 1

## 2015-09-16 MED ORDER — DIVALPROEX SODIUM 500 MG PO DR TAB
500.0000 mg | DELAYED_RELEASE_TABLET | Freq: Every day | ORAL | Status: DC
  Filled 2015-09-16: qty 1

## 2015-09-16 MED ORDER — LORAZEPAM 2 MG/ML IJ SOLN
2.0000 mg | Freq: Once | INTRAMUSCULAR | Status: AC
  Administered 2015-09-16: 2 mg via INTRAMUSCULAR
  Filled 2015-09-16: qty 1

## 2015-09-16 MED ORDER — STERILE WATER FOR INJECTION IJ SOLN
INTRAMUSCULAR | Status: AC
  Administered 2015-09-16: 1.2 mL
  Filled 2015-09-16: qty 10

## 2015-09-16 MED ORDER — TEMAZEPAM 15 MG PO CAPS: 30.0000 mg | ORAL_CAPSULE | Freq: Every day | ORAL | Status: DC

## 2015-09-16 MED ORDER — ZIPRASIDONE MESYLATE 20 MG IM SOLR
INTRAMUSCULAR | Status: AC
  Filled 2015-09-16: qty 20

## 2015-09-16 MED ORDER — LORAZEPAM 1 MG PO TABS
1.0000 mg | ORAL_TABLET | Freq: Three times a day (TID) | ORAL | Status: DC | PRN
  Administered 2015-09-17 – 2015-09-18 (×2): 1 mg via ORAL
  Filled 2015-09-16 (×2): qty 1

## 2015-09-16 MED ORDER — FUROSEMIDE 40 MG PO TABS
40.0000 mg | ORAL_TABLET | Freq: Every day | ORAL | Status: DC
  Administered 2015-09-17 – 2015-09-22 (×6): 40 mg via ORAL
  Filled 2015-09-16 (×6): qty 1

## 2015-09-16 MED ORDER — ZIPRASIDONE MESYLATE 20 MG IM SOLR
10.0000 mg | Freq: Once | INTRAMUSCULAR | Status: AC
  Administered 2015-09-16: 10 mg via INTRAMUSCULAR
  Filled 2015-09-16: qty 20

## 2015-09-16 MED ORDER — GABAPENTIN 300 MG PO CAPS
300.0000 mg | ORAL_CAPSULE | ORAL | Status: DC
  Administered 2015-09-17 – 2015-09-19 (×7): 300 mg via ORAL
  Filled 2015-09-16 (×8): qty 1

## 2015-09-16 MED ORDER — LORAZEPAM 2 MG/ML IJ SOLN: 2.0000 mg | Freq: Once | INTRAMUSCULAR | Status: DC

## 2015-09-16 MED ORDER — DIPHENHYDRAMINE HCL 50 MG/ML IJ SOLN
50.0000 mg | Freq: Once | INTRAMUSCULAR | Status: AC
  Administered 2015-09-16: 50 mg via INTRAMUSCULAR
  Filled 2015-09-16: qty 1

## 2015-09-16 MED ORDER — CARVEDILOL 3.125 MG PO TABS
3.1250 mg | ORAL_TABLET | Freq: Every day | ORAL | Status: DC
  Administered 2015-09-16 – 2015-09-21 (×6): 3.125 mg via ORAL
  Filled 2015-09-16 (×7): qty 1

## 2015-09-16 MED ORDER — ARIPIPRAZOLE 15 MG PO TABS
25.0000 mg | ORAL_TABLET | Freq: Every evening | ORAL | Status: DC
  Administered 2015-09-16: 25 mg via ORAL
  Filled 2015-09-16 (×2): qty 1

## 2015-09-16 MED ORDER — STERILE WATER FOR INJECTION IJ SOLN
INTRAMUSCULAR | Status: AC
  Administered 2015-09-16: 13:00:00
  Filled 2015-09-16: qty 10

## 2015-09-16 MED ORDER — ARIPIPRAZOLE ER 400 MG IM SUSR: 400.0000 mg | INTRAMUSCULAR | Status: DC

## 2015-09-16 MED ORDER — QUETIAPINE FUMARATE ER 200 MG PO TB24
200.0000 mg | ORAL_TABLET | Freq: Every day | ORAL | Status: DC
  Filled 2015-09-16 (×2): qty 1

## 2015-09-16 MED ORDER — LORAZEPAM 2 MG/ML IJ SOLN
1.0000 mg | Freq: Once | INTRAMUSCULAR | Status: AC
  Administered 2015-09-16: 1 mg via INTRAMUSCULAR
  Filled 2015-09-16: qty 1

## 2015-09-16 NOTE — ED Notes (Signed)
Pt presenting with manic behavior, laughing inappropriately, singing, reporting the hospital makes her feel like she is at the spa.
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
## 2015-09-16 NOTE — ED Notes (Signed)
Pt on unit, loud, screaming, referring to nursing staff as "white devils". Bizarre behavior, making references to the end of the world and the current president. Intrusive, going into other pt rooms. Special checks q 15 mins in place for safety. Video monitoring in place.
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
## 2015-09-16 NOTE — Progress Notes (Addendum)
Pt last seen by pcp in march 2017  Entered in d/c instructions  Stacie Daugherty Schedule an appointment as soon as possible for a visit As needed [address] [place] [address] [phone]
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
## 2015-09-16 NOTE — ED Notes (Signed)
Pt OOB talking on hallway phone.

## 2015-09-16 NOTE — ED Notes (Signed)
Bed: [facility] Expected date:  Expected time:  Means of arrival:  Comments: TR 4
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
## 2015-09-16 NOTE — ED Notes (Addendum)
Pt cursing at staff, using the B word, towards staff. Pt cursing on the phone. Redirected back to room. Pt very manic, between cursing at staff, laughing and singing. Pt continually requesting special shoes for Plantar Fasciitis and foot swelling and pain. Soft sole shoes without shoe strings given.

## 2015-09-16 NOTE — ED Notes (Signed)
Pt awake, alert & responsive, no distress noted, agitated, irritable.  Monitoring for safety, Q 15 min checks in effect.
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
## 2015-09-17 DIAGNOSIS — F3164 Bipolar disorder, current episode mixed, severe, with psychotic features: Secondary | ICD-10-CM | POA: Diagnosis not present

## 2015-09-17 MED ORDER — DIVALPROEX SODIUM 500 MG PO DR TAB
500.0000 mg | DELAYED_RELEASE_TABLET | Freq: Two times a day (BID) | ORAL | Status: DC
  Administered 2015-09-17 – 2015-09-22 (×10): 500 mg via ORAL
  Filled 2015-09-17 (×10): qty 1

## 2015-09-17 MED ORDER — LORAZEPAM 2 MG/ML IJ SOLN
2.0000 mg | Freq: Once | INTRAMUSCULAR | Status: AC
  Administered 2015-09-17: 2 mg via INTRAMUSCULAR
  Filled 2015-09-17: qty 1

## 2015-09-17 MED ORDER — CLONAZEPAM 1 MG PO TABS
1.0000 mg | ORAL_TABLET | Freq: Two times a day (BID) | ORAL | Status: DC
  Administered 2015-09-17 (×2): 1 mg via ORAL
  Filled 2015-09-17 (×3): qty 1

## 2015-09-17 MED ORDER — ARIPIPRAZOLE 15 MG PO TABS
15.0000 mg | ORAL_TABLET | Freq: Every evening | ORAL | Status: DC
Start: 2015-09-17 — End: 2015-09-22
  Administered 2015-09-17 – 2015-09-21 (×5): 15 mg via ORAL
  Filled 2015-09-17 (×6): qty 1

## 2015-09-17 MED ORDER — DIPHENHYDRAMINE HCL 50 MG/ML IJ SOLN
50.0000 mg | Freq: Once | INTRAMUSCULAR | Status: AC
  Administered 2015-09-17: 50 mg via INTRAMUSCULAR
  Filled 2015-09-17: qty 1

## 2015-09-17 NOTE — ED Notes (Signed)
On the phone 

## 2015-09-17 NOTE — ED Notes (Signed)
Up to the bathroom 
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
## 2015-09-17 NOTE — BH Assessment (Signed)
Dr. Darleene Cleaver and Charmaine Downs, NP continue to recommend inpatient at this time (500 hall bed). [facility] at capacity. TTS will contact other facilities for placement.

## 2015-09-17 NOTE — ED Notes (Signed)
Visitor onto see
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
## 2015-09-17 NOTE — ED Notes (Signed)
Pt singing inappropriately through the doors, agitated by yelling pt in another department. Attempted to redirect pt back to room.
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
## 2015-09-17 NOTE — Progress Notes (Signed)
CSW referred the patient to the following facilities in order to try and obtain placement:   [facility]   [place], [place] ED CSW 09/17/2015 10:27 PM

## 2015-09-17 NOTE — ED Notes (Signed)
Pt's husband Iona Beard called and reports that he started seeing the current changes after she stopped her PO abilify 2 days ago.  Pt received the Abilify injection while she was in pt.  He reports that she  has difficulty remembering when  to take them, and when he uses the pill boxes she questions if they are the correct medications.  He also reports that she was did very well when she was on tegratol which was stopped last year because of liver problems.  Will relay information to MD/NP.

## 2015-09-17 NOTE — Progress Notes (Signed)
Pt noted standing in the doorway of the recreational room in [facility]  Pt complimented Cm on her dress and spoke with CM about needing sleep  Pt states she was confused when risperdal offered but confirms she has received klonopin that should help per pt with getting sleep Pt pleasant Pt drinking fluids and smiling

## 2015-09-18 NOTE — ED Notes (Signed)
Report from [place]. Patient with visitor, respirations regular and unlabored. Q15 minute rounds and security camera observation to continue.

## 2015-09-18 NOTE — ED Notes (Signed)
Patient noted in room. No complaints, stable, in no acute distress. Q15 minute rounds and monitoring via Security Cameras to continue.  
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
## 2015-09-18 NOTE — ED Notes (Signed)
Patient noted sleeping in room. No complaints, stable, in no acute distress. Q15 minute rounds and monitoring via Security Cameras to continue.  

## 2015-09-18 NOTE — ED Notes (Signed)
Pt husband here concerned about small bump on patients inner left wrist.  I told them both we would keep an eye on it.  Pt denied pain or itching from site.  Pt continues to be manic and intrusive.  15 minute checks and video monitoring continue.

## 2015-09-18 NOTE — Progress Notes (Signed)
CSW assisted TTS and faxed patient's information to various facilities:  [facility] [place] ED CSW 09/18/2015 1:10 PM

## 2015-09-18 NOTE — Progress Notes (Signed)
Per [facility], patient declined due to insurance.

## 2015-09-19 DIAGNOSIS — F3164 Bipolar disorder, current episode mixed, severe, with psychotic features: Secondary | ICD-10-CM | POA: Diagnosis not present

## 2015-09-19 MED ORDER — ZIPRASIDONE MESYLATE 20 MG IM SOLR
20.0000 mg | Freq: Once | INTRAMUSCULAR | Status: AC
  Administered 2015-09-19: 20 mg via INTRAMUSCULAR
  Filled 2015-09-19: qty 20

## 2015-09-19 MED ORDER — TEMAZEPAM 15 MG PO CAPS
30.0000 mg | ORAL_CAPSULE | Freq: Every day | ORAL | Status: DC
  Administered 2015-09-19 – 2015-09-21 (×3): 30 mg via ORAL
  Filled 2015-09-19 (×3): qty 2

## 2015-09-19 MED ORDER — GABAPENTIN 300 MG PO CAPS
300.0000 mg | ORAL_CAPSULE | Freq: Three times a day (TID) | ORAL | Status: DC
  Administered 2015-09-19 – 2015-09-22 (×11): 300 mg via ORAL
  Filled 2015-09-19 (×12): qty 1

## 2015-09-19 NOTE — ED Notes (Signed)
Pt sister in to see 
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
## 2015-09-19 NOTE — ED Notes (Signed)
Patient noted in hall. No complaints, stable, in no acute distress. Q15 minute rounds and monitoring via [employer] to continue.

## 2015-09-19 NOTE — ED Notes (Signed)
Patient noted sleeping in room. No complaints, stable, in no acute distress. Q15 minute rounds and monitoring via Security Cameras to continue.  

## 2015-09-19 NOTE — ED Notes (Signed)
Security and [employer] assisted with IM medication administered without incident.

## 2015-09-19 NOTE — ED Notes (Signed)
Up in hall singing

## 2015-09-19 NOTE — ED Notes (Signed)
On the phone 

## 2015-09-19 NOTE — ED Notes (Signed)
Pt. Walking in hall yelling and cursing at staff. Pt. Not responding to redirection.

## 2015-09-19 NOTE — ED Notes (Signed)
Patient noted in room. No complaints, stable, in no acute distress. Q15 minute rounds and monitoring via Security Cameras to continue.  

## 2015-09-19 NOTE — ED Notes (Signed)
Pt up in the hall, began to knock on the assessment office and talk loudly thru the about going to [facility].  Pt angry about not being transferred to [facility] before now.  Pt is aware that [facility] is seeking placement.  Pt  Also concerned about being sent to [facility] due to difficulties that her mother had there.  Pt able to be redirected w/o difficulty and sat in the day room talking.

## 2015-09-19 NOTE — ED Notes (Signed)
Pt. continues to scream and curse staff.

## 2015-09-19 NOTE — Progress Notes (Signed)
This Probation officer spoke with [employer] with [facility] who denied patient due to no bed availability.   Chesley Noon, MSW, [facility] Triage Specialist [phone]

## 2015-09-19 NOTE — ED Notes (Signed)
Report received from [place]. Patient alert and oriented, warm and dry, in no acute distress. Patient denies SI, HI, AVH and pain. Patient made aware of Q15 minute rounds and security cameras for their safety. Patient instructed to come to me with needs or concerns.

## 2015-09-19 NOTE — ED Notes (Signed)
Up in hall, nad, pleasent, redirectable, talkative.  Red bump noted lt inner wrist, no drainage noted.

## 2015-09-20 MED ORDER — LORAZEPAM 1 MG PO TABS
1.0000 mg | ORAL_TABLET | Freq: Four times a day (QID) | ORAL | Status: DC | PRN
  Administered 2015-09-20 – 2015-09-21 (×3): 1 mg via ORAL
  Filled 2015-09-20 (×4): qty 1

## 2015-09-20 MED ORDER — ACETAMINOPHEN 500 MG PO TABS
500.0000 mg | ORAL_TABLET | Freq: Four times a day (QID) | ORAL | Status: DC | PRN
  Administered 2015-09-20 – 2015-09-21 (×3): 500 mg via ORAL
  Filled 2015-09-20 (×3): qty 1

## 2015-09-20 NOTE — ED Notes (Signed)
Patient noted sleeping in room. No complaints, stable, in no acute distress. Q15 minute rounds and monitoring via Security Cameras to continue.  

## 2015-09-20 NOTE — ED Notes (Signed)
Pt c/o of rt foot pain.  Bilateral edema noted.  Pt encouraged to keep her feet elevated. Pt verbalized understanding.
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
## 2015-09-20 NOTE — ED Notes (Signed)
Patient noted in hall. No complaints, stable, in no acute distress. Q15 minute rounds and monitoring via [employer] to continue.

## 2015-09-20 NOTE — ED Notes (Signed)
Patient noted in room. No complaints, stable, in no acute distress. Q15 minute rounds and monitoring via Security Cameras to continue.  

## 2015-09-20 NOTE — ED Notes (Signed)
Report received from [place]. Patient alert and oriented, warm and dry, in no acute distress. Patient denies SI, HI, AVH and pain. Patient made aware of Q15 minute rounds and security cameras for their safety. Patient instructed to come to me with needs or concerns.

## 2015-09-20 NOTE — ED Notes (Signed)
Up to the bathroom 

## 2015-09-20 NOTE — ED Notes (Signed)
in the dayroom watching tv

## 2015-09-20 NOTE — ED Notes (Signed)
Up in room, has been walking in the hall, talkative, but redirectable

## 2015-09-20 NOTE — ED Notes (Signed)
Patient noted in rest room. No complaints, stable, in no acute distress. Q15 minute rounds and monitoring via [employer] to continue.

## 2015-09-20 NOTE — ED Notes (Addendum)
Patient noted in shower room. No complaints, stable, in no acute distress. Q15 minute rounds and monitoring via [employer] to continue.

## 2015-09-20 NOTE — ED Notes (Signed)
Patient noted in shower. No complaints, stable, in no acute distress. Q15 minute rounds and monitoring via [employer] to continue.

## 2015-09-20 NOTE — ED Notes (Signed)
On the phone 
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
## 2015-09-20 NOTE — ED Notes (Signed)
Pt again encouraged to to keep her feet elevated and reported she would, but immediately got up and started walking around again.  Pt ambulatory w/o difficulty
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
## 2015-09-20 NOTE — ED Notes (Signed)
Pt's husband into see 

## 2015-09-20 NOTE — ED Notes (Signed)
Visitor into see

## 2015-09-21 MED ORDER — OLANZAPINE 10 MG IM SOLR
10.0000 mg | Freq: Once | INTRAMUSCULAR | Status: AC | PRN
  Administered 2015-09-21: 10 mg via INTRAMUSCULAR
  Filled 2015-09-21: qty 10

## 2015-09-21 MED ORDER — DIPHENHYDRAMINE HCL 50 MG/ML IJ SOLN
50.0000 mg | Freq: Once | INTRAMUSCULAR | Status: AC
  Administered 2015-09-21: 50 mg via INTRAMUSCULAR
  Filled 2015-09-21: qty 1

## 2015-09-21 MED ORDER — LORAZEPAM 1 MG PO TABS
2.0000 mg | ORAL_TABLET | Freq: Once | ORAL | Status: AC
  Administered 2015-09-21: 2 mg via ORAL
  Filled 2015-09-21: qty 2

## 2015-09-21 MED ORDER — STERILE WATER FOR INJECTION IJ SOLN
INTRAMUSCULAR | Status: AC
  Administered 2015-09-21: 10:00:00
  Filled 2015-09-21: qty 10

## 2015-09-21 NOTE — BH Assessment (Signed)
Patient was reassessed by TTS.   Patient was assessed in the hallway and appeared agitated and was yelling. Patient denies SI/HI and called this Probation officer "stupid" and started yelling stating that the employees of teh hospital are stupid and states "I'm good because I got me a lawsuit." Patient began to yell louder and louder and was given medication by nurse. Patient asked this Probation officer what school she went to and states "I bet it was [employer], this hospital needs to hire smarter people." Patient then began to yell at police and nurse techs.   Consulted with Dr. Darleene Cleaver who recommends inpatient criteria.

## 2015-09-21 NOTE — ED Notes (Signed)
Patient noted sleeping in room. No complaints, stable, in no acute distress. Q15 minute rounds and monitoring via Security Cameras to continue.  

## 2015-09-21 NOTE — ED Notes (Signed)
Patient denies SI, HI, AVH. Drowsy, restless. Reports mild anxiety and feelings of depression. Feels sad and frustrated still being in [facility]. Continues to blame spouse for being hospitalized.   Encouragement offered.  Q 15 safety checks continue.
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
## 2015-09-21 NOTE — Progress Notes (Signed)
Patient's brother Dr Jodell Cipro called and expressed concern regarding the patient possibly going to [facility] as she feels it was cause behind their mother's death. Concern over PTSD. Dr Jodell Cipro is on patient ROI form.

## 2015-09-21 NOTE — ED Notes (Signed)
Lunch Delivered 
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
## 2015-09-21 NOTE — ED Notes (Signed)
Patient provided her brother's name and number.  Dr Marchia Bond [phone]. Release of information signed.

## 2015-09-21 NOTE — Progress Notes (Signed)
Pt referred to [facility] with authorization [id number]  from [place].  Spoke with Gae Bon at [facility] to provide verbal referral information. Faxed referral including 3 page regional referral form, labs since admission, psych evaluations, IVC copies, as well as ED notes indicating pt's behavior since admission. Gae Bon states they will call back once admitting RN has reviewed.  Sharren Bridge, MSW, LCSW Clinical Social Work, Disposition  09/21/2015 [phone]

## 2015-09-22 ENCOUNTER — Inpatient Hospital Stay
Admit: 2015-09-22 | Discharge: 2015-10-07 | DRG: 885 | Disposition: A | Discharge status: Home / Self Care | Payer: Medicaid Other | Source: Other Acute Inpatient Hospital | Attending: Psychiatry | Admitting: Psychiatry

## 2015-09-22 DIAGNOSIS — I10 Essential (primary) hypertension: Secondary | ICD-10-CM | POA: Diagnosis present

## 2015-09-22 DIAGNOSIS — Z818 Family history of other mental and behavioral disorders: Secondary | ICD-10-CM | POA: Diagnosis not present

## 2015-09-22 DIAGNOSIS — Z809 Family history of malignant neoplasm, unspecified: Secondary | ICD-10-CM

## 2015-09-22 DIAGNOSIS — Z87891 Personal history of nicotine dependence: Secondary | ICD-10-CM

## 2015-09-22 DIAGNOSIS — R7989 Other specified abnormal findings of blood chemistry: Secondary | ICD-10-CM | POA: Diagnosis present

## 2015-09-22 DIAGNOSIS — Z888 Allergy status to other drugs, medicaments and biological substances status: Secondary | ICD-10-CM

## 2015-09-22 DIAGNOSIS — F3164 Bipolar disorder, current episode mixed, severe, with psychotic features: Principal | ICD-10-CM | POA: Diagnosis present

## 2015-09-22 DIAGNOSIS — Z8249 Family history of ischemic heart disease and other diseases of the circulatory system: Secondary | ICD-10-CM

## 2015-09-22 DIAGNOSIS — F319 Bipolar disorder, unspecified: Secondary | ICD-10-CM | POA: Diagnosis present

## 2015-09-22 DIAGNOSIS — G4700 Insomnia, unspecified: Secondary | ICD-10-CM | POA: Diagnosis present

## 2015-09-22 DIAGNOSIS — F29 Unspecified psychosis not due to a substance or known physiological condition: Secondary | ICD-10-CM | POA: Diagnosis present

## 2015-09-22 MED ORDER — ARIPIPRAZOLE 15 MG PO TABS
30.0000 mg | ORAL_TABLET | Freq: Every evening | ORAL | Status: DC
  Administered 2015-09-22 – 2015-09-30 (×9): 30 mg via ORAL
  Filled 2015-09-22 (×9): qty 2

## 2015-09-22 MED ORDER — MAGNESIUM HYDROXIDE 400 MG/5ML PO SUSP: 30.0000 mL | Freq: Every day | ORAL | Status: DC | PRN

## 2015-09-22 MED ORDER — TEMAZEPAM 15 MG PO CAPS
30.0000 mg | ORAL_CAPSULE | Freq: Every day | ORAL | Status: DC
  Administered 2015-09-22 – 2015-10-06 (×15): 30 mg via ORAL
  Filled 2015-09-22 (×15): qty 2

## 2015-09-22 MED ORDER — ALUM & MAG HYDROXIDE-SIMETH 200-200-20 MG/5ML PO SUSP: 30.0000 mL | ORAL | Status: DC | PRN

## 2015-09-22 MED ORDER — CARVEDILOL 3.125 MG PO TABS
3.1250 mg | ORAL_TABLET | Freq: Every day | ORAL | Status: DC
  Administered 2015-09-22 – 2015-10-06 (×15): 3.125 mg via ORAL
  Filled 2015-09-22 (×16): qty 1

## 2015-09-22 MED ORDER — ZIPRASIDONE MESYLATE 20 MG IM SOLR
10.0000 mg | Freq: Once | INTRAMUSCULAR | Status: AC
  Administered 2015-09-22: 10 mg via INTRAMUSCULAR
  Filled 2015-09-22: qty 20

## 2015-09-22 MED ORDER — LORAZEPAM 1 MG PO TABS: 1.0000 mg | ORAL_TABLET | Freq: Three times a day (TID) | ORAL | Status: DC | PRN

## 2015-09-22 MED ORDER — GABAPENTIN 300 MG PO CAPS
300.0000 mg | ORAL_CAPSULE | Freq: Three times a day (TID) | ORAL | Status: DC
  Administered 2015-09-22 – 2015-10-07 (×55): 300 mg via ORAL
  Filled 2015-09-22 (×56): qty 1

## 2015-09-22 MED ORDER — ONDANSETRON HCL 4 MG PO TABS: 4.0000 mg | ORAL_TABLET | Freq: Three times a day (TID) | ORAL | Status: DC | PRN

## 2015-09-22 MED ORDER — FUROSEMIDE 20 MG PO TABS
40.0000 mg | ORAL_TABLET | Freq: Every day | ORAL | Status: DC
  Administered 2015-09-23 – 2015-10-05 (×13): 40 mg via ORAL
  Filled 2015-09-22 (×14): qty 2

## 2015-09-22 MED ORDER — AMLODIPINE BESYLATE 5 MG PO TABS
5.0000 mg | ORAL_TABLET | Freq: Every day | ORAL | Status: DC
  Administered 2015-09-23 – 2015-10-05 (×13): 5 mg via ORAL
  Filled 2015-09-22 (×14): qty 1

## 2015-09-22 MED ORDER — DIPHENHYDRAMINE HCL 25 MG PO CAPS
50.0000 mg | ORAL_CAPSULE | Freq: Four times a day (QID) | ORAL | Status: DC | PRN
  Administered 2015-09-22: 50 mg via ORAL
  Filled 2015-09-22: qty 2

## 2015-09-22 MED ORDER — ARIPIPRAZOLE ER 400 MG IM SUSR: 400.0000 mg | Freq: Once | INTRAMUSCULAR | Status: DC

## 2015-09-22 MED ORDER — DIPHENHYDRAMINE HCL 50 MG/ML IJ SOLN: 50.0000 mg | Freq: Four times a day (QID) | INTRAMUSCULAR | Status: DC | PRN

## 2015-09-22 MED ORDER — LORAZEPAM 2 MG PO TABS
2.0000 mg | ORAL_TABLET | Freq: Four times a day (QID) | ORAL | Status: DC | PRN
  Administered 2015-09-22: 2 mg via ORAL
  Filled 2015-09-22: qty 1

## 2015-09-22 MED ORDER — FLUPHENAZINE HCL 5 MG PO TABS: 10.0000 mg | ORAL_TABLET | Freq: Four times a day (QID) | ORAL | Status: DC | PRN

## 2015-09-22 MED ORDER — ACETAMINOPHEN 325 MG PO TABS
650.0000 mg | ORAL_TABLET | Freq: Four times a day (QID) | ORAL | Status: DC | PRN
  Administered 2015-09-25: 650 mg via ORAL
  Filled 2015-09-22 (×2): qty 2

## 2015-09-22 MED ORDER — IBUPROFEN 600 MG PO TABS
600.0000 mg | ORAL_TABLET | Freq: Three times a day (TID) | ORAL | Status: DC | PRN
  Administered 2015-09-23 – 2015-10-01 (×6): 600 mg via ORAL
  Filled 2015-09-22 (×6): qty 1

## 2015-09-22 MED ORDER — DIPHENHYDRAMINE HCL 50 MG/ML IJ SOLN
25.0000 mg | Freq: Once | INTRAMUSCULAR | Status: AC
  Administered 2015-09-22: 25 mg via INTRAMUSCULAR
  Filled 2015-09-22: qty 1

## 2015-09-22 MED ORDER — FLUPHENAZINE HCL 2.5 MG/ML IJ SOLN
10.0000 mg | Freq: Four times a day (QID) | INTRAMUSCULAR | Status: DC | PRN
  Filled 2015-09-22: qty 4

## 2015-09-22 MED ORDER — LORAZEPAM 2 MG/ML IJ SOLN
2.0000 mg | Freq: Once | INTRAMUSCULAR | Status: AC
  Administered 2015-09-22: 2 mg via INTRAMUSCULAR
  Filled 2015-09-22: qty 1

## 2015-09-22 MED ORDER — LORAZEPAM 2 MG/ML IJ SOLN: 2.0000 mg | Freq: Four times a day (QID) | INTRAMUSCULAR | Status: DC | PRN

## 2015-09-22 MED ORDER — ACETAMINOPHEN 325 MG PO TABS: 650.0000 mg | ORAL_TABLET | ORAL | Status: DC | PRN

## 2015-09-22 MED ORDER — STERILE WATER FOR INJECTION IJ SOLN
INTRAMUSCULAR | Status: AC
  Filled 2015-09-22: qty 10

## 2015-09-22 MED ORDER — DIVALPROEX SODIUM 500 MG PO DR TAB
500.0000 mg | DELAYED_RELEASE_TABLET | Freq: Two times a day (BID) | ORAL | Status: DC
  Administered 2015-09-22 – 2015-10-07 (×31): 500 mg via ORAL
  Filled 2015-09-22 (×32): qty 1

## 2015-09-22 NOTE — Progress Notes (Signed)
Abilify Maintena injection was ordered to be given today 4/25. The last time it was given appears to be on 09/07/15 and doses should not be given closer than 26 days.  I discussed this with Dr. Cleatrice Burke and got a verbal order to leave Ability Maintena scheduled for 10/05/15. Could give as early as 10/03/15 if the patient is still here.    Romeo Rabon, PharmD, pager [phone]. 09/22/2015,11:07 AM.

## 2015-09-22 NOTE — ED Notes (Signed)
Pt is extremely intrusive today.  She is bothering other patients and getting other patients angry.  She is impossible to redirect.  She is in [employer] and has been asked by patients to stop talking to them.

## 2015-09-22 NOTE — ED Notes (Signed)
Pt discharged ambulatory with [facility].  All belongings were sent with patient.

## 2015-09-22 NOTE — BH Assessment (Signed)
Patient accepted to [facility].  Dr. Jerilee Hoh is the accepting physician.  Writer spoke to the [employer].    Per Silva Bandy she has ensured that the patient is registered for admission to [facility].  Patient is IVC and support paperwork is not needed.

## 2015-09-22 NOTE — Progress Notes (Signed)
D: Pt denies SI/HI/AVH, but noted responding to internal stimuli. Patient is argumentative and intrusive. Patient appears at the nursing station at different times with multiple request. Patient is not receptive to treatment plan, and  appears manic; hyper verbal with pressured speech, and she is not interacting with peers and staff appropriately.  A: Pt was offered support and encouragement. Pt was given scheduled medications. Pt was encouraged to attend groups. Q 15 minute checks were done for safety.  R:Pt did not attend evening group. Pt is taking medication. Pt has multiple complaints.Pt is not receptive to treatment, safety maintained on unit.

## 2015-09-22 NOTE — Progress Notes (Signed)
Patient pleasant and cooperative during admission assessment. Patient denies SI/HI at this time. Patient denies AVH. Patient informed of fall risk status, fall risk assessed "high" at this time. Patient oriented to unit/staff/room. Patient denies any questions/concerns at this time. Patient safe on unit with Q15 minute checks for safety.Skin assessment & body search done,no contraband found.

## 2015-09-22 NOTE — Plan of Care (Signed)
Problem: Alteration in thought process Goal: LTG-Patient behavior demonstrates decreased signs psychosis (Patient behavior demonstrates decreased signs of psychosis to the point the patient is safe to return home and continue treatment in an outpatient setting.)  Outcome: Not Progressing Patient demonstrate psychosis.

## 2015-09-22 NOTE — Tx Team (Signed)
Initial Interdisciplinary Treatment Plan   PATIENT STRESSORS: Health problems Medication change or noncompliance   PATIENT STRENGTHS: Communication skills Supportive family/friends   PROBLEM LIST: Problem List/Patient Goals Date to be addressed Date deferred Reason deferred Estimated date of resolution  Bipolar disorder 09/22/2015     Psychosis 09/22/2015                                                DISCHARGE CRITERIA:  Adequate post-discharge living arrangements Medical problems require only outpatient monitoring  PRELIMINARY DISCHARGE PLAN: Attend PHP/IOP Return to previous living arrangement  PATIENT/FAMIILY INVOLVEMENT: This treatment plan has been presented to and reviewed with the patient, Stacie Daugherty, and/or family member, .  The patient and family have been given the opportunity to ask questions and make suggestions.  Stacie Daugherty 09/22/2015, 6:28 PM

## 2015-09-22 NOTE — Progress Notes (Signed)
Pt on [facility] waiting list per Marlowe Kays.  Sharren Bridge, MSW, LCSW Clinical Social Work, Disposition  09/22/2015 [phone]

## 2015-09-23 ENCOUNTER — Encounter: Payer: Self-pay | Admitting: Psychiatry

## 2015-09-23 DIAGNOSIS — F3164 Bipolar disorder, current episode mixed, severe, with psychotic features: Principal | ICD-10-CM

## 2015-09-23 MED ORDER — OXCARBAZEPINE 300 MG PO TABS
300.0000 mg | ORAL_TABLET | Freq: Two times a day (BID) | ORAL | Status: DC
  Administered 2015-09-23: 300 mg via ORAL
  Filled 2015-09-23: qty 1

## 2015-09-23 NOTE — BHH Group Notes (Signed)
[place] LCSW Group Therapy  09/23/2015 3:13 PM  Type of Therapy:  Group Therapy  Participation Level:  Active  Participation Quality:  Intrusive and Monopolizing  Affect:  Labile  Cognitive:  Disorganized and Tangential  Insight:  Lacking and Limited  Engagement in Therapy:  Lacking, Monopolizing, Off Topic and Resistant  Modes of Intervention:  Discussion, Education, Limit-setting, Socialization and Support  Summary of Progress/Problems:Pt came late to group, participated some, but required frequent redirection as she came in talking over other patients, answering for them, etc.  Social worker provided limits reinforcing group rules. Pt did not like this accusing  SW of disrespect.  SW informed Pt of respectful behavior is following group rules and allowing everyone to share.  Pt apologized for behavior at end of group.  August Saucer, MSW, LCSW 09/23/2015, 3:13 PM

## 2015-09-23 NOTE — BHH Suicide Risk Assessment (Signed)
[facility] INPATIENT:  Family/Significant Other Suicide Prevention Education  Suicide Prevention Education:  Education Completed; Stacie Daugherty (husband) [phone] has been identified by the patient as the family member/significant other with whom the patient will be residing, and identified as the person(s) who will aid the patient in the event of a mental health crisis (suicidal ideations/suicide attempt).  With written consent from the patient, the family member/significant other has been provided the following suicide prevention education, prior to the and/or following the discharge of the patient.  The suicide prevention education provided includes the following:  Suicide risk factors  Suicide prevention and interventions  National Suicide Hotline telephone number  [employer] assessment telephone number  [facility] Emergency Assistance [place] and/or Residential Mobile Crisis Unit telephone number  Request made of family/significant other to:  Remove weapons (e.g., guns, rifles, knives), all items previously/currently identified as safety concern.    Remove drugs/medications (over-the-counter, prescriptions, illicit drugs), all items previously/currently identified as a safety concern.  The family member/significant other verbalizes understanding of the suicide prevention education information provided.  The family member/significant other agrees to remove the items of safety concern listed above.  Keene Breath, MSW, LCSW 09/23/2015, 2:17 PM

## 2015-09-23 NOTE — Plan of Care (Signed)
Problem: Ineffective individual coping Goal: STG: Patient will remain free from self harm Outcome: Progressing No self harm reported or observed     

## 2015-09-23 NOTE — BHH Suicide Risk Assessment (Signed)
[facility] Admission Suicide Risk Assessment   Nursing information obtained from:    Demographic factors:    Current Mental Status:    Loss Factors:    Historical Factors:    Risk Reduction Factors:     Total Time spent with patient: 1 hour Principal Problem: Bipolar disorder, curr episode mixed, severe, with psychotic features ([place]) Diagnosis:   Patient Active Problem List   Diagnosis Date Noted  . Bipolar disorder, curr episode mixed, severe, with psychotic features ([place]) [F31.64] 08/26/2015  . History of posttraumatic stress disorder (PTSD) [Z86.59] 08/26/2015  . Essential hypertension [I10] 08/26/2015  . History of eczema [Z87.2] 04/08/2015  . Pedal edema [R60.0] 08/06/2014  . Positive ANA (antinuclear antibody) [R76.8] 12/19/2012  . Biological false positive RPR test [R76.8] 08/18/2012  . HYPERCHOLESTEROLEMIA [E78.00] 04/28/2009  . HIDRADENITIS SUPPURATIVA [L73.2] 09/06/2007  . OBESITY [E66.9] 04/17/2007   Subjective Data: Irritability, mood instability, aggression.  Continued Clinical Symptoms:  Alcohol Use Disorder Identification Test Final Score (AUDIT): 0 The "Alcohol Use Disorders Identification Test", Guidelines for Use in Primary Care, Second Edition.  World Pharmacologist [facility]). Score between 0-7:  no or low risk or alcohol related problems. Score between 8-15:  moderate risk of alcohol related problems. Score between 16-19:  high risk of alcohol related problems. Score 20 or above:  warrants further diagnostic evaluation for alcohol dependence and treatment.   CLINICAL FACTORS:   Bipolar Disorder:   Mixed State   Musculoskeletal: Strength & Muscle Tone: within normal limits Gait & Station: normal Patient leans: N/A  Psychiatric Specialty Exam: Review of Systems  Psychiatric/Behavioral: Positive for hallucinations. The patient has insomnia.   All other systems reviewed and are negative.   Blood pressure 125/86, pulse 102, temperature 98.3 F (36.8 C),  temperature source Oral, resp. rate 18, height 5\' 4"  (1.626 m), weight 102.059 kg (225 lb), SpO2 100 %.Body mass index is 38.6 kg/(m^2).  General Appearance: Fairly Groomed  Engineer, water::  Fair  Speech:  Clear and Coherent  Volume:  Normal  Mood:  Euphoric  Affect:  Congruent  Thought Process:  Disorganized  Orientation:  Full (Time, Place, and Person)  Thought Content:  Delusions, Hallucinations: Auditory and Paranoid Ideation  Suicidal Thoughts:  No  Homicidal Thoughts:  No  Memory:  Immediate;   Fair Recent;   Fair Remote;   Fair  Judgement:  Poor  Insight:  Lacking  Psychomotor Activity:  Increased  Concentration:  Fair  Recall:  [place]  Language: Fair  Akathisia:  No  Handed:  Right  AIMS (if indicated):     Assets:  Communication Skills Desire for Improvement Financial Resources/Insurance Housing Intimacy Physical Health Resilience Social Support  Sleep:  Number of Hours: 7  Cognition: WNL  ADL's:  Intact    COGNITIVE FEATURES THAT CONTRIBUTE TO RISK:  None    SUICIDE RISK:   Moderate:  Frequent suicidal ideation with limited intensity, and duration, some specificity in terms of plans, no associated intent, good self-control, limited dysphoria/symptomatology, some risk factors present, and identifiable protective factors, including available and accessible social support.  PLAN OF CARE: Hospital admission, medication management, discharge planning.  [name] is a 55 year old female with a history of bipolar disorder admitted for disorganized psychotic behavior in the context of poor medication adherence.  1. Mood and psychosis. The patient has been maintained on a combination of Abilify and Depakote. Depakote level was therapeutic at 78. We'll continue.  2. Insomnia. She is on Restoril.  3. Agitation. When necessary Benadryl, Prolixin, and Ativan are available.  4. Hypertension. We will continue amlodipine, carvedilol, and  furosemide.  5. Pain. We continue Neurontin.  6. Disposition. The patient will be discharged to home. She will follow up with her regular provider at [facility].  I certify that inpatient services furnished can reasonably be expected to improve the patient's condition.   Orson Slick, MD 09/23/2015, 12:03 PM

## 2015-09-23 NOTE — Progress Notes (Signed)
Recreation Therapy Notes  Date: 04.26.17 Time: 9:30 am Location: Craft Room  Group Topic: Self-esteem  Goal Area(s) Addresses:  Patient will write one positive trait about self.  Behavioral Response: Attentive, Interactive, Disruptive  Intervention: I Am  Activity: Patients were given a worksheet with the letter I on it and instructed to write as many positive traits inside the letter.  Education: LRT educated patients on ways they can increase their self-esteem.  Education Outcome: Acknowledges education/In group clarification offered  Clinical Observations/Feedback: Patient completed activity by writing positive traits. Patient continued to say, "I am the Alpha and Omega" as a positive trait about herself during group. LRT had to redirect multiple times. Patient difficult to redirect. Patient contributed to group discussion by stating how it felt to list positive traits.  Leonette Monarch, LRT/CTRS 09/23/2015 12:46 PM

## 2015-09-23 NOTE — Progress Notes (Signed)
Pt continues to be intrusive to staff and peers, not easily redirectable at times. Pulled shirt up at nurses station exposing breast this morning wanting nurses to look at bruise on arm. Continues with somatic complaints. Did take medication and attend most groups today. Chaplain in for visit as requested.  Encouragement and support offered, pt redirected as needed. Remains safe on unit with q15 min checks.

## 2015-09-23 NOTE — H&P (Addendum)
Psychiatric Admission Assessment Adult  Patient Identification: Stacie Daugherty MRN:  [id number] Date of Evaluation:  09/23/2015 Chief Complaint:  Bipolar Depression Principal Diagnosis: Bipolar disorder, curr episode mixed, severe, with psychotic features ([place]) Diagnosis:   Patient Active Problem List   Diagnosis Date Noted  . Bipolar disorder, curr episode mixed, severe, with psychotic features ([place]) [F31.64] 08/26/2015  . History of posttraumatic stress disorder (PTSD) [Z86.59] 08/26/2015  . Essential hypertension [I10] 08/26/2015  . History of eczema [Z87.2] 04/08/2015  . Pedal edema [R60.0] 08/06/2014  . Positive ANA (antinuclear antibody) [R76.8] 12/19/2012  . Biological false positive RPR test [R76.8] 08/18/2012  . HYPERCHOLESTEROLEMIA [E78.00] 04/28/2009  . HIDRADENITIS SUPPURATIVA [L73.2] 09/06/2007  . OBESITY [E66.9] 04/17/2007   History of Present Illness:  Identifying data. [name] is a 55 year old female with a history of bipolar disorder.  Chief complaint. My husband was trying to protect me."  History of present illness. Information was obtained from the patient and the chart. [name] has a long history of bipolar illness. She had been taking Tegretol for 27 years until it was discontinued during her previous hospitalization at [facility] due to elevated liver enzymes. She has not been worse since. She has been maintained on a combination of Abilify, Abilify Maintena, and Depakote. She was hospitalized at [facility] from 08/25/2015 until 09/07/2015. She returned to the emergency room on the 19th and has been treated there until April 25 when she was transferred to [facility]. Reportedly the patient was floridly psychotic, disorganized, agitated, and threatening towards her family. She does not remember many details. She believes that she was trying to go to a concert at church but only way she was trying to buy pizza for a homeless person  at [facility] and was picked up by police. The patient reports that since her mother passed away in Nov 14, 2014 she had been extremely depressed and unable to function. For 8 months she had been in bed. She was then admitted for manic episode in the spring. At the moment she denies any symptoms of depression and does not want to be on an antidepressant, anxiety, or psychosis. She denies alcohol, prescription pills or illicit substance use. She complains of feeling tired, dislikes Abilify, and complains of unsteady gait with some falls. Reportedly she has been using a cane at home. Her major stressors include the loss of her mother who passed away at [facility], and anger towards her sister who had healthcare power of attorney and made medical decisions for the mother. The patient wants to sue [facility] for inadequate care. She feels angry and powerless all the time. She denies that she was aggressive with her family prior to admission.  Psychiatric history. She has been diagnosed with bipolar disorder many years ago. She has been treated with Tegretol with successful 27 years. She has not been doing so well since it was discontinued. She has been tried on lithium but did not like it. She has been tried on Zyprexa, Seroquel, Abilify, Geodon, Lamictal and Haldol. She has never been on Risperdal or Invega. She denies ever attempting suicide. She was hospitalized twice before.  Family psychiatric history. Mother with bipolar was treated with lithium and Risperdal. She reportedly died at [facility] after 5 or 6 months there. Actually she probably dilated [facility] which she was transferred after she developed pulmonary embolism.  Social history. The patient used to work for [employer] but lost that job 9 years ago.  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 She has not been employed for the past 2 years. She tried to apply for disability but was turned down several times. She has Medicaid  now. She lives with her husband whom she talks as paranoid schizophrenic with delusions of grandeur, and her 105 year old son. She had a sister who has been taking care of her mother's estate.  Total Time spent with patient: 1 hour  Past Psychiatric History:  Bipolar disorder.  Is the patient at risk to self? No.  Has the patient been a risk to self in the past 6 months? No.  Has the patient been a risk to self within the distant past? No.  Is the patient a risk to others? No.  Has the patient been a risk to others in the past 6 months? No.  Has the patient been a risk to others within the distant past? No.   Prior Inpatient Therapy:   Prior Outpatient Therapy:    Alcohol Screening: 1. How often do you have a drink containing alcohol?: Never 2. How many drinks containing alcohol do you have on a typical day when you are drinking?: 1 or 2 3. How often do you have six or more drinks on one occasion?: Never Preliminary Score: 0 4. How often during the last year have you found that you were not able to stop drinking once you had started?: Never 5. How often during the last year have you failed to do what was normally expected from you becasue of drinking?: Never 6. How often during the last year have you needed a first drink in the morning to get yourself going after a heavy drinking session?: Never 7. How often during the last year have you had a feeling of guilt of remorse after drinking?: Never 8. How often during the last year have you been unable to remember what happened the Daugherty before because you had been drinking?: Never 9. Have you or someone else been injured as a result of your drinking?: No 10. Has a relative or friend or a doctor or another health worker been concerned about your drinking or suggested you cut down?: No Alcohol Use Disorder Identification Test Final Score (AUDIT): 0 Brief Intervention: AUDIT score less than 7 or less-screening does not suggest unhealthy  drinking-brief intervention not indicated Substance Abuse History in the last 12 months:  No. Consequences of Substance Abuse: NA Previous Psychotropic Medications: Yes  Psychological Evaluations: No  Past Medical History:  Past Medical History  Diagnosis Date  . Hypertension   . Bipolar 1 disorder ([place])     Hospitalized multiple times for bipolar disorder ([facility], and [facility], most recently in [place])  . Hidradenitis suppurativa     Past Surgical History  Procedure Laterality Date  . Induced abortion      in patient's 7s in [place], [place].   Family History:  Family History  Problem Relation Age of Onset  . Depression Mother   . Pulmonary embolism Mother   . Bipolar disorder Mother   . Cancer Father     ?prostate cancer  . Hypertension Maternal Grandfather   . Hypertension Paternal Grandmother    Family Psychiatric  History:  Bipolar disorder. Tobacco Screening: @FLOW ([phone])::1)@ Social History:  History  Alcohol Use No    Comment: Social alcohol use when younger     History  Drug Use No    Comment: Social marijuana use when younger  Additional Social History: Marital status: Married Number of Years Married: 66 What types of issues is patient dealing with in the relationship?: he could have handled this at [employer], he filled out the wrong forms. Are you sexually active?: Yes What is your sexual orientation?: heterosexual Does patient have children?: Yes How many children?: 1 How is patient's relationship with their children?: "He's my world. He's the only reason I don't go to the hospital."                         Allergies:   Allergies  Allergen Reactions  . Haldol [Haloperidol Lactate] Other (See Comments)    Pt  Just doesn't like because she cant remember anything the next day.    Lab Results: No results found for this or any previous visit (from the past 48 hour(s)).  Blood Alcohol  level:  Lab Results  Component Value Date   [facility] <5 09/16/2015   ETH <5 [id number]    Metabolic Disorder Labs:  Lab Results  Component Value Date   HGBA1C 5.7* 08/27/2015   MPG 117 08/27/2015   MPG 117* 08/27/2014   Lab Results  Component Value Date   PROLACTIN 49.8* 08/27/2015   Lab Results  Component Value Date   CHOL 171 08/27/2015   TRIG 66 08/27/2015   HDL 63 08/27/2015   CHOLHDL 2.7 08/27/2015   VLDL 13 08/27/2015   LDLCALC 95 08/27/2015   LDLCALC 127* 09/11/2012    Current Medications: Current Facility-Administered Medications  Medication Dose Route Frequency Provider Last Rate Last Dose  . acetaminophen (TYLENOL) tablet 650 mg  650 mg Oral Q6H PRN Hildred Priest, MD      . alum & mag hydroxide-simeth (MAALOX/MYLANTA) 200-200-20 MG/5ML suspension 30 mL  30 mL Oral Q4H PRN Hildred Priest, MD      . amLODipine (NORVASC) tablet 5 mg  5 mg Oral Daily Hildred Priest, MD   5 mg at 09/23/15 0908  . ARIPiprazole (ABILIFY) tablet 30 mg  30 mg Oral QPM Hildred Priest, MD   30 mg at 09/22/15 1843  . carvedilol (COREG) tablet 3.125 mg  3.125 mg Oral Q supper Hildred Priest, MD   3.125 mg at 09/22/15 1843  . diphenhydrAMINE (BENADRYL) capsule 50 mg  50 mg Oral Q6H PRN Hildred Priest, MD   50 mg at 09/22/15 2143   Or  . diphenhydrAMINE (BENADRYL) injection 50 mg  50 mg Intramuscular Q6H PRN Hildred Priest, MD      . divalproex (DEPAKOTE) DR tablet 500 mg  500 mg Oral BID PC Hildred Priest, MD   500 mg at 09/23/15 0908  . fluPHENAZine (PROLIXIN) injection 10 mg  10 mg Intramuscular Q6H PRN Hildred Priest, MD      . fluPHENAZine (PROLIXIN) tablet 10 mg  10 mg Oral Q6H PRN Hildred Priest, MD      . furosemide (LASIX) tablet 40 mg  40 mg Oral Daily Hildred Priest, MD   40 mg at 09/23/15 0908  . gabapentin (NEURONTIN) capsule 300 mg  300 mg Oral TID WC & HS Hildred Priest, MD   300 mg at 09/23/15 1204  . ibuprofen (ADVIL,MOTRIN) tablet 600 mg  600 mg Oral Q8H PRN Delfin Gant, NP      . LORazepam (ATIVAN) tablet 2 mg  2 mg Oral Q6H PRN Hildred Priest, MD   2 mg at 09/22/15 2143   Or  . LORazepam (ATIVAN) injection 2 mg  2 mg  Intramuscular Q6H PRN Hildred Priest, MD      . magnesium hydroxide (MILK OF MAGNESIA) suspension 30 mL  30 mL Oral Daily PRN Hildred Priest, MD      . ondansetron [facility]) tablet 4 mg  4 mg Oral Q8H PRN Delfin Gant, NP      . temazepam (RESTORIL) capsule 30 mg  30 mg Oral QHS Hildred Priest, MD   30 mg at 09/22/15 2243   PTA Medications: Prescriptions prior to admission  Medication Sig Dispense Refill Last Dose  . amLODipine (NORVASC) 5 MG tablet Take 1 tablet (5 mg total) by mouth daily. For high blood pressure 1 tablet 0 Unknown  . [START ON 10/05/2015] ARIPiprazole 400 MG SUSR Inject 400 mg into the muscle every 28 (twenty-eight) days. (Due to be given on 10-05-15): For mood control 1 each 0 Unknown  . carvedilol (COREG) 3.125 MG tablet Take 1 tablet (3.125 mg total) by mouth daily with supper. For high blood pressure 30 tablet 0 Unknown  . Cholecalciferol (VITAMIN D PO) Take 1 capsule by mouth daily.   Unknown  . divalproex (DEPAKOTE) 250 MG DR tablet Take 1 tablet (250 mg) in AM & 500 mg at PM & 500 mg at bedtime: For mood stabilization 150 tablet 0 Unknown  . furosemide (LASIX) 40 MG tablet Take 1 tablet (40 mg total) by mouth daily. For swelling 30 tablet 0 Unknown  . gabapentin (NEURONTIN) 300 MG capsule Take 1 capsule (300 mg total) by mouth 3 (three) times daily at 8am, 2pm and bedtime. For agitation 90 capsule 0 Unknown  . lamoTRIgine (LAMICTAL) 25 MG tablet Take 1 tablet (25 mg total) by mouth 2 (two) times daily. (Outpatient provider to gradually taper patient off Lamictal): For mood stabilization 60 tablet 0 Unknown  . MELATONIN PO Take 1 tablet by mouth  at bedtime.   Unknown  . QUEtiapine (SEROQUEL XR) 200 MG 24 hr tablet Take 1 tablet (200 mg total) by mouth at bedtime. (Out patient provider to taper patient off of Seroquel): For mood control 30 tablet 0 Unknown  . temazepam (RESTORIL) 30 MG capsule Take 1 capsule (30 mg total) by mouth at bedtime. For insomnia 14 capsule 0 Unknown  . ARIPiprazole (ABILIFY) 5 MG tablet Take 5 tablets (25 mg total) by mouth every evening. For mood control (Patient not taking: Reported on 09/16/2015) 7 tablet 0 Not Taking at Unknown time  . triamcinolone cream (KENALOG) 0.1 % Apply 1 application topically 2 (two) times daily. For skin rash (Patient not taking: Reported on 09/16/2015) 1 g 0 Not Taking at Unknown time  . Vitamin D, Ergocalciferol, (DRISDOL) 50000 units CAPS capsule Take 1 capsule (50,000 Units total) by mouth every 7 (seven) days. For bone health (Patient not taking: Reported on 09/23/2015) 1 capsule 0     Musculoskeletal: Strength & Muscle Tone: within normal limits Gait & Station: normal Patient leans: N/A  Psychiatric Specialty Exam: Physical Exam  Nursing note and vitals reviewed. Constitutional: She is oriented to person, place, and time. She appears well-developed and well-nourished.  HENT:  Head: Normocephalic and atraumatic.  Eyes: Conjunctivae and EOM are normal. Pupils are equal, round, and reactive to light.  Neck: Normal range of motion.  Cardiovascular: Normal rate, regular rhythm and normal heart sounds.   Respiratory: Effort normal and breath sounds normal.  GI: Soft. Bowel sounds are normal.  Musculoskeletal: Normal range of motion.  Neurological: She is alert and oriented to person, place, and time.  Skin: Skin  is warm.    Review of Systems  Psychiatric/Behavioral: Positive for hallucinations. The patient has insomnia.   All other systems reviewed and are negative.   Blood pressure 125/86, pulse 102, temperature 98.3 F (36.8 C), temperature source Oral, resp. rate 18,  height 5\' 4"  (1.626 m), weight 102.059 kg (225 lb), SpO2 100 %.Body mass index is 38.6 kg/(m^2).  See SRA.                                                  Sleep:  Number of Hours: 7     Treatment Plan Summary: Daily contact with patient to assess and evaluate symptoms and progress in treatment and Medication management   [name] is a 55 year old female with a history of bipolar disorder admitted for disorganized psychotic behavior in the context of poor medication adherence.  1. Mood and psychosis. The patient has been maintained on a combination of Abilify, Abilify Maintena, Neurontin and Depakote. Depakote level was therapeutic at 78. We'll continue.  She was given Abilify Maintena injection at the beginning of April. She did exceedingly well on Tegretol for 27 years. We will start Trileptal and monitor LFTs.  2. Insomnia. She is on Restoril.  3. Agitation. When necessary Benadryl, Prolixin, and Ativan are available.  4. Hypertension. We will continue amlodipine, carvedilol, and furosemide.  5. Metabolic syndrome monitoring. Labs were checked in March 2017. Hemoglobin A1c and lipid profile were normal, TSH 5.7, prolactin 49.8.   6. Disposition. The patient will be discharged to home. She will follow up with her regular provider at [facility].  Observation Level/Precautions:  15 minute checks  Laboratory:  CBC Chemistry Profile UDS UA  Psychotherapy:    Medications:    Consultations:    Discharge Concerns:    Estimated LOS:  Other:     I certify that inpatient services furnished can reasonably be expected to improve the patient's condition.    Orson Slick, MD 4/26/201712:08 PM
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
## 2015-09-23 NOTE — Progress Notes (Signed)
PT Cancellation Note  Patient Details Name: Stacie Daugherty MRN: [id number] DOB: [birth year]   Cancelled Treatment:    Reason Eval/Treat Not Completed: PT screened, no needs identified, will sign off. PT greeted patient in hallway, patient able to ambulate, turn and look over her shoulder with no device. She appears to have had one fall while squatting in the past, though no balance deficits noted with either cane or no device in this session. Patient educated on use of SPC as she would drag occasionally. No real acute PT needs as she has not had a change in mobility status. She appears to have the impression she is at high risk for falling, though this could be more a result of her cognitive status. PT will sign off with no acute mobility needs identified.   Kerman Passey, PT, DPT    09/23/2015, 4:09 PM

## 2015-09-24 LAB — AMMONIA: Ammonia: 15 umol/L (ref 9–35)

## 2015-09-24 MED ORDER — ZIPRASIDONE MESYLATE 20 MG IM SOLR: 10.0000 mg | Freq: Once | INTRAMUSCULAR | Status: DC

## 2015-09-24 MED ORDER — BENZTROPINE MESYLATE 1 MG PO TABS
1.0000 mg | ORAL_TABLET | Freq: Once | ORAL | Status: AC
  Administered 2015-09-24: 1 mg via ORAL
  Filled 2015-09-24: qty 1

## 2015-09-24 MED ORDER — ZIPRASIDONE HCL 20 MG PO CAPS
20.0000 mg | ORAL_CAPSULE | Freq: Once | ORAL | Status: AC
  Administered 2015-09-24: 20 mg via ORAL
  Filled 2015-09-24: qty 1

## 2015-09-24 MED ORDER — BENZTROPINE MESYLATE 1 MG/ML IJ SOLN
1.0000 mg | Freq: Once | INTRAMUSCULAR | Status: AC
  Filled 2015-09-24: qty 1

## 2015-09-24 MED ORDER — OXCARBAZEPINE 300 MG PO TABS
150.0000 mg | ORAL_TABLET | Freq: Two times a day (BID) | ORAL | Status: DC
  Administered 2015-09-24 – 2015-09-25 (×3): 150 mg via ORAL
  Filled 2015-09-24: qty 2
  Filled 2015-09-24: qty 1

## 2015-09-24 NOTE — Progress Notes (Signed)
D: Patient remains  Intrusive, argumentative, manic  Labile.   With staff and some patients  No insight into behavior . Unable to attend unit programing  Patient noted to target female peer on unit  Calling her names ( stupid)  in front of  patients . Patient calling on her to to do tasks for her .Marland KitchenStated appetite is good and energy level  Is hyper.    .  No auditory hallucinations  No pain concerns . Appropriate ADL'S. Interacting with peers and staff. Periods of loud laughing  And cursing . Compliant with medications this shift  A: Encourage patient participation with unit programming . Instruction  Given on  Medication , verbalize understanding. R: Voice no other concerns. Staff continue to monitor

## 2015-09-24 NOTE — [facility] Group Notes (Signed)
Anna LCSW Group Therapy  09/24/2015 3:05 PM  Type of Therapy:  Group Therapy  Participation Level:  Active  Participation Quality:  Intrusive and Monopolizing  Affect:  Labile  Cognitive:  Disorganized and Delusional  Insight:  Limited  Engagement in Therapy:  Limited  Modes of Intervention:  Discussion, Education, Socialization and Support  Summary of Progress/Problems: Balance in life: Patients will discuss the concept of balance and how it looks and feels to be unbalanced. Pt will identify areas in their life that is unbalanced and ways to become more balanced.  Stacie Daugherty arrived to group towards the end. She discussed the death of her mother, who she states died at [facility].  When [name] arrived to group, many patients decided to leave. Another patient attempted to discuss her treatment, [name] told her "Do not do it. That was outlawed 60 years ago. I wouldn't let any of my family members do that." This statement scared the other patient. CSW told her that was not a topic that is going to be discussed but she continued. She was very difficult to redirect and would become irritable very easily. It should be discussed whether she should be allowed to attend group until she improves.   [employer]gate MSW, [place]  09/24/2015, 3:05 PM

## 2015-09-24 NOTE — Plan of Care (Signed)
Problem: Ineffective individual coping Goal: LTG: Patient will report a decrease in negative feelings Outcome: Not Progressing Patient manic , at present  Unable to  Understand or show insight into problems

## 2015-09-24 NOTE — Progress Notes (Signed)
Recreation Therapy Notes  Date: 04.27.17 Time: 9:30 am Location: Craft Room  Group Topic: Leisure Education  Goal Area(s) Addresses:  Patient will identify activities for each letter of the alphabet. Patient will verbalize emotion felt when participating in activities.  Behavioral Response: Attentive, Interactive, Disruptive  Intervention: Leisure Alphabet  Activity: Patients were given an Leisure Air traffic controller and as a group picked leisure activities for each letter of the alphabet.  Education: LRT educated patients on why leisure is important.  Education Outcome: In group clarification offered  Clinical Observations/Feedback: Patient was talking to peer about how she needed to wash her clothes. LRT encouraged patient to sit down. Patient stated, "You can't tell me what to do." LRT informed patient if she could not listen, then patient would need to go back to her room. Patient stated, "You can't tell me what to do." and went to sit down. Patient wrote some activities down. Patient contributed to group discussion by stating some healthy leisure activities, some non healthy leisure activities, and some words. LRT had to redirect patient multiple times. Patient difficult to redirect.  Leonette Monarch, LRT/CTRS 09/24/2015 10:20 AM

## 2015-09-24 NOTE — Progress Notes (Signed)
D: Pt denies SI/HI/AVH, but noted responding to internal stimuli Pt is hostile, irritable and verbally aggressive towards  staff. Patient is not interacting with peers and staff appropriately.  A: Pt was offered support and encouragement. Pt was given scheduled medications. Pt was encouraged to attend groups. Q 15 minute checks were done for safety.  R:Pt attend groups and does not interact well with peers and staff. Pt is taking medication. Pt is not  receptive to treatment,  safety maintained on unit.

## 2015-09-24 NOTE — Plan of Care (Signed)
Problem: Alteration in thought process Goal: LTG-Patient behavior demonstrates decreased signs psychosis (Patient behavior demonstrates decreased signs of psychosis to the point the patient is safe to return home and continue treatment in an outpatient setting.)  Outcome: Not Progressing Psychosis noted.

## 2015-09-24 NOTE — Progress Notes (Signed)
Patient eating am meal in dayroom, social with peers. Spoke with patient to introduce self and encouraged patient to come for medications after am meal. Patient ambulating unit and writer offers patient medications at this time and patient states "No I'll come to med room when I'm done in dining room". Patient noted to be ambulating outside of med room, nurse offered patient her meds at this time. Patient again refuses meds stating " I need to fill out my menu now you evil, white bitch". Patient ambulates to her room yelling, cursing, upsetting therapeutic milieu. Attempts to set limits met with angry outburst, as Probation officer attempts med pass and safety rounds. Nurses agree patient will work with Futures trader. Safety maintained. One on one with a staff and patient calm. No additional distress. Patient noted to be intrusive with other patients and other patients attempt to set limits with her. Patient intrusive and attempts to give them advice.
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
## 2015-09-24 NOTE — Tx Team (Signed)
Interdisciplinary Treatment Plan Update (Adult)  Date:  09/24/2015 Time Reviewed:  4:08 PM  Progress in Treatment: Attending groups: Yes. Participating in groups:  Yes. Taking medication as prescribed:  No. Tolerating medication:  No. Family/Significant othe contact made:  Yes, individual(s) contacted:  patient's husband Patient understands diagnosis:  Yes. Discussing patient identified problems/goals with staff:  Yes. Medical problems stabilized or resolved:  Yes. Denies suicidal/homicidal ideation: Yes. Issues/concerns per patient self-inventory:  Yes. Other:  New problem(s) identified: No, Describe:  none reported  Discharge Plan or Barriers: Patient will stabilize on medications and discharge home with outpatient follow up in [facility].  Reason for Continuation of Hospitalization: Mania Medication stabilization  Comments:   Estimated length of stay: up to 4 days, expected discharge Monday 09/28/15  New goal(s):  Review of initial/current patient goals per problem list:  1. Goal(s): Participate in aftercare plan   Met: Yes  Target date: by discharge  As evidenced by: patient will participate in aftercare plan AEB aftercare provider and housing plan identified at discharge 09/24/15: Patient can discharge home with her husband and follows up in [facility]. Goal met.   2. Goal (s): Decrease mania    Met: No  Target date: by discharge  As evidenced by: patient demonstrates decreased symptoms of mania 09/24/15: Patient is still manic, refuses meds at times, and labile will need continued med management before discharging.     Attendees: Patient: Stacie Daugherty 4/27/20173:43 PM  Physician: Orson Slick, MD 4/27/20173:43 PM  Nursing: Nicanor Bake, RN 4/27/20173:43 PM  Other: Carmell Austria, LCSW 4/27/20173:43 PM  Other:  4/27/20173:43 PM  Other:  4/27/20173:43 PM  Other:  4/27/20173:43 PM  Other:  4/27/20173:43 PM   Other:  4/27/20173:43 PM  Other:  4/27/20173:43 PM  Other:  4/27/20173:43 PM  Other:  4/27/20173:43 PM  Other:  4/27/20173:43 PM         Scribe for Treatment Team:   Keene Breath, MSW, LCSW  09/24/2015, 4:08 PM

## 2015-09-25 LAB — URINALYSIS COMPLETE WITH MICROSCOPIC (ARMC ONLY)
Bacteria, UA: NONE SEEN
Bilirubin Urine: NEGATIVE
Glucose, UA: NEGATIVE mg/dL
Hgb urine dipstick: NEGATIVE
Ketones, ur: NEGATIVE mg/dL
Leukocytes, UA: NEGATIVE
Nitrite: NEGATIVE
Protein, ur: NEGATIVE mg/dL
Specific Gravity, Urine: 1.009 (ref 1.005–1.030)
pH: 5 (ref 5.0–8.0)

## 2015-09-25 LAB — PREGNANCY, URINE: Preg Test, Ur: NEGATIVE

## 2015-09-25 MED ORDER — ZIPRASIDONE HCL 40 MG PO CAPS
80.0000 mg | ORAL_CAPSULE | Freq: Once | ORAL | Status: AC
  Administered 2015-09-25: 80 mg via ORAL
  Filled 2015-09-25: qty 2

## 2015-09-25 MED ORDER — TRAZODONE HCL 100 MG PO TABS
200.0000 mg | ORAL_TABLET | Freq: Every day | ORAL | Status: DC
  Administered 2015-09-25: 200 mg via ORAL
  Filled 2015-09-25 (×7): qty 2

## 2015-09-25 MED ORDER — BENZTROPINE MESYLATE 1 MG/ML IJ SOLN
1.0000 mg | Freq: Once | INTRAMUSCULAR | Status: DC
  Filled 2015-09-25: qty 1

## 2015-09-25 MED ORDER — CARBAMAZEPINE 200 MG PO TABS
200.0000 mg | ORAL_TABLET | Freq: Two times a day (BID) | ORAL | Status: DC
  Administered 2015-09-29 – 2015-10-07 (×16): 200 mg via ORAL
  Filled 2015-09-25 (×23): qty 1

## 2015-09-25 MED ORDER — ZIPRASIDONE HCL 40 MG PO CAPS
80.0000 mg | ORAL_CAPSULE | Freq: Two times a day (BID) | ORAL | Status: DC
  Administered 2015-09-25 (×2): 80 mg via ORAL
  Filled 2015-09-25 (×3): qty 2

## 2015-09-25 MED ORDER — ZIPRASIDONE MESYLATE 20 MG IM SOLR: 20.0000 mg | INTRAMUSCULAR | Status: DC | PRN

## 2015-09-25 MED ORDER — BENZTROPINE MESYLATE 1 MG PO TABS: 1.0000 mg | ORAL_TABLET | Freq: Once | ORAL | Status: DC

## 2015-09-25 MED ORDER — ZIPRASIDONE HCL 40 MG PO CAPS
80.0000 mg | ORAL_CAPSULE | Freq: Three times a day (TID) | ORAL | Status: DC | PRN
  Filled 2015-09-25 (×2): qty 2

## 2015-09-25 MED ORDER — ZIPRASIDONE MESYLATE 20 MG IM SOLR: 10.0000 mg | Freq: Once | INTRAMUSCULAR | Status: DC

## 2015-09-25 MED ORDER — BENZTROPINE MESYLATE 1 MG/ML IJ SOLN: 1.0000 mg | Freq: Once | INTRAMUSCULAR | Status: DC

## 2015-09-25 NOTE — [facility] Group Notes (Signed)
Lena LCSW Group Therapy  09/25/2015 3:02 PM  Type of Therapy:  Group Therapy  Participation Level:  Did Not Attend  Modes of Intervention:  Discussion, Education, Socialization and Support  Summary of Progress/Problems: Emotional Regulation: Patients will identify both negative and positive emotions. They will discuss emotions they have difficulty regulating and how they impact their lives. Patients will be asked to identify healthy coping skills to combat unhealthy reactions to negative emotions.  Stacie Daugherty was asked not to attend groups due to disruptive behaviors.   [place] MSW, [place]  09/25/2015, 3:02 PM

## 2015-09-25 NOTE — Progress Notes (Signed)
Continues to come to nurses station and yell at staff through windows. Threatening them and using racial slurs. Pt difficult to redirect. IM Geodon and IM cogentin obtained, however, Pt is refusing and stated that she will fight.

## 2015-09-25 NOTE — Plan of Care (Signed)
Problem: Alteration in thought process Goal: LTG-Patient behavior demonstrates decreased signs psychosis (Patient behavior demonstrates decreased signs of psychosis to the point the patient is safe to return home and continue treatment in an outpatient setting.)  Outcome: Not Progressing Patient remains very disorganized, paranoid, intrusive, demanding, loud and irritated.
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
## 2015-09-25 NOTE — Plan of Care (Signed)
Problem: Consults Goal: Psychosis Patient Education See Patient Education Module for education specifics.  Outcome: Not Progressing Patient not receptive to instruction to disease process   [place] RN
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
## 2015-09-25 NOTE — Progress Notes (Signed)
Recreation Therapy Notes  Patient was not invited to participate in group today as patient is disruptive and causes other patients to leave group. LRT informed staff patient was not invited to group. Patient came to group. LRT informed patient she was not invited to group. Patient stated, "You white, evil bitch, I was just told I could go to any group I wanted to." LRT informed patient she could not be in group at this time. Patient continued to call LRT a "white, evil bitch". LRT asked for tech to get patient. Tech stated patient was told not to come to group. Tech came to get patient. As patient was leaving, patient stated, "You are not even white. You are [place]. You need to know your heritage."   Leonette Monarch, LRT/CTRS 09/25/2015 12:17 PM

## 2015-09-25 NOTE — Progress Notes (Signed)
Patient remained very loud, demanding, intrusive, paranoid with disorganized thoughts, spoke of some very nonsensical stuff, cursed at staff and peers, irritated/instigated others, disruptive, paced and was very needy. She had to be removed from group as she was very disruptive and some peers wanted to get her. Patient paranoid about husband and family. Required lots of verbal redirection. Will continue to redirect and offer PRN medication.

## 2015-09-25 NOTE — Progress Notes (Signed)
Pt up throughout the night being intrusive. Talking loudly and being demanding of staff. Stating we aren't doing what we are supposed to be doing. Pacing halls. In and out of room consistently.

## 2015-09-25 NOTE — Plan of Care (Signed)
Problem: Consults Goal: [facility] General Treatment Patient Education Outcome: Progressing Instructed patient on medications she is taking tonight she requires reinstruction  Frequently  Museum/gallery curator

## 2015-09-26 MED ORDER — LORAZEPAM 2 MG PO TABS
2.0000 mg | ORAL_TABLET | ORAL | Status: DC | PRN
  Administered 2015-09-26 – 2015-09-28 (×5): 2 mg via ORAL
  Filled 2015-09-26 (×5): qty 1

## 2015-09-26 MED ORDER — LORAZEPAM 2 MG/ML IJ SOLN: 2.0000 mg | INTRAMUSCULAR | Status: DC | PRN

## 2015-09-26 NOTE — Progress Notes (Signed)
Pt remains with 1:1 sitter for safety. Pt calm, in room currently.

## 2015-09-26 NOTE — Progress Notes (Signed)
Pt remains with 1:1 sitter, presently calm and cooperative, no aggressive behavior noted thus far. Will continue to assess for safety.
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
## 2015-09-26 NOTE — Progress Notes (Signed)
   09/26/15 1010  Clinical Encounter Type  Visited With Patient  Visit Type Follow-up  Referral From Patient  Consult/Referral To Chaplain  Spiritual Encounters  Spiritual Needs Emotional  Stress Factors  Patient Stress Factors Major life changes  Visited w/patient and provided compassionate audience to her concerns. Chap. Barrington Worley G. [place]

## 2015-09-26 NOTE — Plan of Care (Signed)
Problem: Ineffective individual coping Goal: STG: Pt will be able to identify effective and ineffective STG: Pt will be able to identify effective and ineffective coping patterns  Outcome: Not Progressing Pt had to be placed on 1:1 sitter for safety.

## 2015-09-26 NOTE — Progress Notes (Signed)
   09/26/15 1400  Clinical Encounter Type  Visited With Patient  Visit Type Follow-up  Referral From Nurse  Consult/Referral To Chaplain  Spiritual Encounters  Spiritual Needs Emotional;Prayer  Stress Factors  Patient Stress Factors Exhausted;Health changes;Major life changes  Pt. was experiencing anxiety and feelings of threat. After listening to concerns, pt. quieted. She was advised to cooperate with staff and provided with contemplative prayer coping skills. Chap. Jannah Guardiola G. [place]

## 2015-09-26 NOTE — Progress Notes (Signed)
Pt placed on 1:1 sitter for safety. Pt extremely aggressive and agitated. Threatening staff and peers, intrusive to staff and peers. Pt fixated on female pt, reporting that's her adoptive son and that staff is not treating him right. Not able to redirect pt. Noted yelling and screaming at staff, calling nurses "Bitches and Fools" calling the caucasion staff "white devil bitches" and threatening. Pt reports being upset with nurse from previous shift for tricking her into taking Geodon. Pt refused Geodon today. Md notified, pt received order for Ativan, q 4hr as needed. Pt remains on 1:1 for safety.
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
## 2015-09-26 NOTE — [facility] Group Notes (Signed)
[facility] LCSW Group Therapy  09/26/2015 3:10 PM  Type of Therapy:  Group Therapy  Participation Level:  Did Not Attend  Modes of Intervention:  Discussion, Education, Socialization and Support  Summary of Progress/Problems: Feelings around Relapse. Group members discussed the meaning of relapse and shared personal stories of relapse, how it affected them and others, and how they perceived themselves during this time. Group members were encouraged to identify triggers, warning signs and coping skills used when facing the possibility of relapse. Social supports were discussed and explored in detail.   [place]  MSW, [place]   09/26/2015, 3:10 PM

## 2015-09-26 NOTE — Progress Notes (Signed)
Pt remains on 1:1 sitter for safety, continues to curse and yell at staff, calling sitter "Bitches", chanting "Fuck, fuck, fuck the police, they're going down. Pt noted calling husband yelling and cursing telling him to "get me the fuck out of here". Pt not easily redirectable.

## 2015-09-26 NOTE — Progress Notes (Signed)
Pt continues on back hall with 1:1 sitter for safety. Encouraged pt to calm down so we could try pt with peers in day room. Pt continues to yell, reports staff treats patients like dogs and she is not [place] beg for anything. Pt becomes increasingly aggressive when requests are denied.

## 2015-09-26 NOTE — Progress Notes (Addendum)
Patient calmer, remains with 1:1 sitter for safety. Pt off of back hall for medications and meal. Pt currently in dayroom, no aggressive behavior noted at this time. Prn given with good relief.

## 2015-09-27 LAB — HEPATIC FUNCTION PANEL
ALT: 16 U/L (ref 14–54)
AST: 22 U/L (ref 15–41)
Albumin: 3.7 g/dL (ref 3.5–5.0)
Alkaline Phosphatase: 46 U/L (ref 38–126)
Bilirubin, Direct: <0.1 mg/dL — ABNORMAL LOW (ref 0.1–0.5)
Total Bilirubin: 0.5 mg/dL (ref 0.3–1.2)
Total Protein: 7.8 g/dL (ref 6.5–8.1)

## 2015-09-27 NOTE — Progress Notes (Addendum)
Pt remains on 1:1 without incident. All behavior noted has been appropriate. Pt has been active on the unit with limited redirecting needed at this time. Will continue to observe and redirect as needed.

## 2015-09-27 NOTE — Progress Notes (Signed)
Pt remains on 1:1 without incident. All behavior noted has been appropriate.
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
## 2015-09-27 NOTE — Progress Notes (Addendum)
Pt remains on 1:1 without incident. All behavior noted has been appropriate.

## 2015-09-27 NOTE — [facility] Group Notes (Signed)
[place] Group Notes:  (Nursing/MHT/Case Management/Adjunct)  Date:  09/27/2015  Time:  6:03 AM  Type of Therapy:  Group Therapy  Participation Level:  Active  Participation Quality:  Intrusive and Redirectable  Affect:  Defensive and Flat  Cognitive:  Disorganized  Insight:  Limited  Engagement in Group:  Limited  Modes of Intervention:  Discussion  Summary of Progress/Problems: Pt interrupted staff and fellow pt's as they shared their goals. She stated that one of the doctors had overdosed her. Staff asked PT to leave and continue the outside conversation she was having with a fellow pt in the community room.   Jenetta Downer Lyra Alaimo 09/27/2015, 6:03 AM

## 2015-09-27 NOTE — Progress Notes (Signed)
Patient ID: Stacie Daugherty, female   DOB: 1961-01-13, 55 y.o.   MRN: [id number]  CSW spoke to [place] at [facility] on 09/26/15. Pt is now on wait list.   Wray Kearns MSW, [facility]  09/27/2015 3:29 PM

## 2015-09-27 NOTE — [facility] Group Notes (Signed)
[facility] LCSW Group Therapy  09/27/2015 2:33 PM  Type of Therapy:  Group Therapy  Participation Level:  Did Not Attend  Modes of Intervention:  Discussion, Education, Socialization and Support  Summary of Progress/Problems:Boundaries: Patients defined boundaries and discussed the importance of them. Patients identified their own boundaries and how they feel when they are crossed. Patients discussed ways to create and/ or improve their personal boundaries.    Idamae Coccia L Aloni Chuang MSW, LCSWA  09/27/2015, 2:33 PM   

## 2015-09-27 NOTE — Progress Notes (Signed)
Patient remains on 1:1 with sitter, she spent most of the evening in the dayroom with peers. She had a good visit with her husband and he received a copy of her medication record. She only wanted to take Ativan and Temazepam when it was time to take night medications on shift.  She required minimal redirection on shift although. She is currently resting in bed quietly at this time.

## 2015-09-27 NOTE — Progress Notes (Signed)
Pt remains on 1:1 without incident. Pt talked about her day leading up to her being put on the back hall. Pt refused her am dose of tegretol and geodon but took the rest of her AM meds. All behavior noted have been appropriate.Will continue to monitor and maintain a safe environment.

## 2015-09-28 MED ORDER — OLANZAPINE 10 MG IM SOLR
10.0000 mg | Freq: Every day | INTRAMUSCULAR | Status: DC
  Filled 2015-09-28: qty 10

## 2015-09-28 MED ORDER — LORAZEPAM 2 MG PO TABS
2.0000 mg | ORAL_TABLET | Freq: Four times a day (QID) | ORAL | Status: DC | PRN
  Administered 2015-09-29 – 2015-10-04 (×7): 2 mg via ORAL
  Filled 2015-09-28 (×7): qty 1

## 2015-09-28 MED ORDER — OLANZAPINE 5 MG PO TBDP
20.0000 mg | ORAL_TABLET | Freq: Every day | ORAL | Status: DC
  Administered 2015-09-28: 20 mg via ORAL
  Filled 2015-09-28: qty 4

## 2015-09-28 MED ORDER — OLANZAPINE 5 MG PO TBDP
40.0000 mg | ORAL_TABLET | Freq: Every day | ORAL | Status: DC
Start: 2015-09-29 — End: 2015-10-01
  Administered 2015-09-29 – 2015-10-01 (×3): 40 mg via ORAL
  Filled 2015-09-28 (×3): qty 8
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
## 2015-09-28 NOTE — Progress Notes (Signed)
This morning during breakfast patient was interfering with other patients and stated to ECT patient that ECT is a old way of treatment and she does not suggest this to anybody.She refused to take [employer].Patient got agitated & started talking loud in the hallway.Patient was argumentative with staff.Put patient on 1:1 sitter.Talked to patient to deescalate.Chaplin in to talk to patient.Patient looks sleepy but resisting to lie down.Patient was cursing staff for closing the doors.Patient was taken out of the closed doors with an agreement to behave in the milieu.Attended group with sitter.No inappropriate  behaviors noted.

## 2015-09-28 NOTE — Plan of Care (Signed)
Problem: Ineffective individual coping Goal: STG: Patient will remain free from self harm Outcome: Progressing Pt remains free from harm.  Problem: Alteration in thought process Goal: LTG-Patient verbalizes understanding importance med regimen (Patient verbalizes understanding of importance of medication regimen and need to continue outpatient care.)  Outcome: Not Progressing Pt refuses some nighttime medications despite encouragement from Probation officer.

## 2015-09-28 NOTE — Progress Notes (Signed)
D: Pt is seen in the milieu this evening interacting with peers. Pt remains intrusive and easily agitated at times and requires redirection from staff. Pt is demanding with medication pass, refusing tegretol and trazodone. Denies SI/HI/AVH at this time. Denies pain. A: Emotional support and encouragement provided. Medications administered with education. q15 minute safety checks maintained. R: Pt remains free from harm. Will continue to monitor.

## 2015-09-28 NOTE — [facility] Group Notes (Signed)
[place] Group Notes:  (Nursing/MHT/Case Management/Adjunct)  Date:  09/28/2015  Time:  3:47 PM  Type of Therapy:  Psychoeducational Skills  Participation Level:  Minimal  Participation Quality:  Drowsy  Affect:  Flat  Cognitive:  Lacking  Insight:  Limited  Engagement in Group:  Poor  Modes of Intervention:  Discussion, Education and Support  Summary of Progress/Problems:  Stacie Daugherty 09/28/2015, 3:47 PM
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
## 2015-09-28 NOTE — Progress Notes (Signed)
Pt taken off 1:1. Pt active on the unit with little redirection needed. Denies SI/HI/AVH at this time. Will continue to monitor.

## 2015-09-29 MED ORDER — DIPHENHYDRAMINE HCL 50 MG/ML IJ SOLN
50.0000 mg | Freq: Three times a day (TID) | INTRAMUSCULAR | Status: DC | PRN
Start: 2015-09-29 — End: 2015-10-07
  Administered 2015-09-29: 50 mg via INTRAMUSCULAR
  Filled 2015-09-29: qty 1

## 2015-09-29 MED ORDER — DIPHENHYDRAMINE HCL 25 MG PO CAPS
50.0000 mg | ORAL_CAPSULE | Freq: Three times a day (TID) | ORAL | Status: DC | PRN
  Administered 2015-10-03: 50 mg via ORAL
  Filled 2015-09-29: qty 2

## 2015-09-29 MED ORDER — FLUPHENAZINE HCL 2.5 MG/ML IJ SOLN
10.0000 mg | Freq: Three times a day (TID) | INTRAMUSCULAR | Status: DC | PRN
  Administered 2015-09-29: 10 mg via INTRAMUSCULAR
  Filled 2015-09-29 (×2): qty 4

## 2015-09-29 MED ORDER — LORAZEPAM 2 MG/ML IJ SOLN
2.0000 mg | Freq: Three times a day (TID) | INTRAMUSCULAR | Status: DC | PRN
  Administered 2015-09-29: 2 mg via INTRAMUSCULAR
  Filled 2015-09-29: qty 1

## 2015-09-29 MED ORDER — FLUPHENAZINE HCL 5 MG PO TABS: 10.0000 mg | ORAL_TABLET | Freq: Three times a day (TID) | ORAL | Status: DC | PRN

## 2015-09-29 NOTE — Progress Notes (Signed)
Patient's mood was labile throughout the shift.  At the start of the shift patient was cursing at staff and verbally aggressive.  Patient stated, "Wash my clothes, Bitch!  I can't stand that [place] doctor.  Get out of my room now!"  Patient was triggered by receiving a message that her husband would not be able to visit her on the unit today.  At that moment patient reported that she would not take any of her scheduled medications throughout the night.  Later, patient became redirectable and agreed to take some of her night medications.  She refused her Tegretol (she reports not taking this medication) and Trazodone (she states she does not take that and Restoril).  Patient then reports being upset because she is a "social person" and wants to be around the other patients on the milieu.  Patient remains a 1:1 for safety.
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
## 2015-09-29 NOTE — Tx Team (Signed)
Interdisciplinary Treatment Plan Update (Adult)  Date:  09/29/2015 Time Reviewed:  11:54 AM  Progress in Treatment: Attending groups: Yes. Participating in groups:  Yes. Taking medication as prescribed:  No. Tolerating medication:  No. Family/Significant othe contact made:  Yes, individual(s) contacted:  patient's husband Patient understands diagnosis:  Yes. Discussing patient identified problems/goals with staff:  Yes. Medical problems stabilized or resolved:  Yes. Denies suicidal/homicidal ideation: Yes. Issues/concerns per patient self-inventory:  Yes. Other:  New problem(s) identified: No, Describe:  none reported  Discharge Plan or Barriers: Patient will stabilize on medications and discharge home with outpatient follow up in [facility].  Reason for Continuation of Hospitalization: Mania Medication stabilization  Comments:   Estimated length of stay: up to 4 days, expected discharge Monday 10/05/15  New goal(s):  Review of initial/current patient goals per problem list:  1. Goal(s): Participate in aftercare plan   Met: Yes  Target date: by discharge  As evidenced by: patient will participate in aftercare plan AEB aftercare provider and housing plan identified at discharge 09/24/15: Patient can discharge home with her husband and follows up in [facility]. Goal met.   2. Goal (s): Decrease mania    Met: No  Target date: by discharge  As evidenced by: patient demonstrates decreased symptoms of mania 09/24/15: Patient is still manic, refuses meds at times, and labile will need continued med management before discharging.  09/29/15: Goal progressing.     Attendees: Attendees:  Patient: Family:  Physician: Dr. Bary Leriche, MD    09/29/2015 9:30 AM  Nursing: Polly Cobia, RN     09/29/2015 9:30 AM  Clinical Social Worker: Marylou Flesher, [place]  09/29/2015 9:30 AM  Clinical Social Worker: Carmell Austria, [place]  09/29/2015 9:30 AM  Recreational Therapist:  Everitt Amber, LRT  09/29/2015 9:30 AM  Psychologist: Consuella Lose. Psy D  09/29/2015 9:30 AM  Nursing: Abran Cantor, RN   09/29/2015 9:30 AM                                                Scribe for Treatment Team:   Claudine Mouton, MSW, LCSWA  09/29/2015, 11:54 AM

## 2015-09-29 NOTE — [facility] Group Notes (Signed)
[place] Group Notes:  (Nursing/MHT/Case Management/Adjunct)  Date:  09/29/2015  Time:  2:14 PM  Type of Therapy:  Psychoeducational Skills  Participation Level:  Did Not Attend  Charise Killian 09/29/2015, 2:14 PM

## 2015-09-30 NOTE — Progress Notes (Signed)
Pt asleep snoring.

## 2015-09-30 NOTE — Progress Notes (Signed)
Pt awake in room. Pt c/o of wetting bed. Bed cleaned, linens changed, new scrubs given. Pt laughing loudly and joking with Probation officer and staff. Pt intermittently making hateful racial marks about Dr. Bary Leriche during conversation.

## 2015-09-30 NOTE — Progress Notes (Signed)
D: Pt has calmed down considerably. Pt is laughing and joking with Probation officer and other staff. Pt is still loud at times, racially inappropriate, and paranoid. Pt not verbally or physically aggressive or threatening at this time. Pt did mentioned having an "anger problem." Pt had no other complaints. Pt has very little insight into condition. A: Offered active listening and support. Provided therapeutic communication. Encouraged pt to take medications. Administered scheduled medications. Attempted to educate pt on inapropriate and dangerous behavior that led to previous restraint. R: Pt took all medications at night except for trazodone. Pt continues to be labile. Pt eventually allowed vital signs to be taken. Pt remains on 1:1 with staff and security due to behavior. Will continue to monitor.

## 2015-09-30 NOTE — Progress Notes (Signed)
Pt in day room in green hall watching TV

## 2015-09-30 NOTE — Progress Notes (Signed)
   09/30/15 1900  Clinical Encounter Type  Visited With Patient  Visit Type Initial  Referral From Patient  Consult/Referral To Chaplain  Spiritual Encounters  Spiritual Needs Prayer  Chaplain provided pastoral care.   [place] [phone]

## 2015-09-30 NOTE — Progress Notes (Signed)
Pt standing in doorway of room talking loudly and joking with sitter and security.

## 2015-09-30 NOTE — BHH Group Notes (Signed)
[facility] LCSW Group Therapy  09/30/2015 11:25 AM  Type of Therapy:  Group Therapy  Participation Level:  Did Not Attend  Summary of Progress/Problems: Patient is not stable to attend group at this time.   Keene Breath, MSW, LCSW 09/30/2015, 11:25 AM
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
## 2015-09-30 NOTE — Progress Notes (Addendum)
D: Observed pt on green hall behind locked doors with 1:1 sitter and security . Patient alert and oriented. Patient refused assessment questions. Pt affect is labile and irritable. Pt very verbally aggressive. Pt yelling, cursing, call writer "white devil." Pt making various racial slurs about other staff and doctor. Pt became extremely upset when pt was told that husband could not visit tonight. Pt yelled multiple times "you better call security down here, I'm going to tear this place up." Pt screamed at writer to leave. Pt much less verbally aggressive towards sitter. A: Offered active listening and support. Attempted to provide therapeutic communication. Attempted to verbally deescalate and redirect pt. Attempted pt provide as show of support. When patient continued to scream at Probation officer and escalate, Probation officer left hall, as pt was not directing anger towards sitter and security at this time. Charge nurse called Dr. Jerilee Hoh for possible IM forced med order. R: Pt continued to scream and yell. Pt calmed down for a bit after Probation officer left hall. Pt remains on 1:1 with sitter and security. Writer will continue to assess situation.
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
## 2015-09-30 NOTE — BHH Group Notes (Signed)
[facility] LCSW Aftercare Discharge Planning Group Note   09/30/2015 11:25 AM  Participation Quality:   Patient is not stable enough to attend group at this time.    Keene Breath, MSW, LCSW

## 2015-09-30 NOTE — Plan of Care (Signed)
Problem: Consults Goal: [facility] General Treatment Patient Education Outcome: Progressing Patient agrees to take meds as recommended by MD.

## 2015-10-01 MED ORDER — OLANZAPINE 5 MG PO TBDP
20.0000 mg | ORAL_TABLET | Freq: Two times a day (BID) | ORAL | Status: DC
  Administered 2015-10-02 – 2015-10-03 (×3): 20 mg via ORAL
  Filled 2015-10-01 (×3): qty 4

## 2015-10-01 MED ORDER — ARIPIPRAZOLE ER 400 MG IM SUSR
400.0000 mg | INTRAMUSCULAR | Status: DC
  Administered 2015-10-05: 400 mg via INTRAMUSCULAR
  Filled 2015-10-01 (×2): qty 400

## 2015-10-01 MED ORDER — TRIAMCINOLONE ACETONIDE 0.1 % EX CREA
TOPICAL_CREAM | Freq: Two times a day (BID) | CUTANEOUS | Status: DC
  Administered 2015-10-01: 21:00:00 via TOPICAL
  Administered 2015-10-03 – 2015-10-04 (×2): 1 via TOPICAL
  Administered 2015-10-05: 21:00:00 via TOPICAL
  Administered 2015-10-06: 1 via TOPICAL
  Filled 2015-10-01 (×2): qty 15

## 2015-10-01 NOTE — BHH Group Notes (Signed)
[facility] LCSW Aftercare Discharge Planning Group Note   10/01/2015 12:17 PM  Participation Quality:  Patient is manic and labile and not able to participate in group at this point.   Keene Breath, MSW, LCSW

## 2015-10-01 NOTE — BHH Group Notes (Signed)
[place] [facility] Group Notes:  (Nursing/MHT/Case Management/Adjunct)  Date:  10/01/2015  Time:  4:07 PM  Type of Therapy:  Group Therapy  Participation Level:  Did Not Attend  Killian Schwer De'Chelle Aileene Lanum 10/01/2015, 4:07 PM

## 2015-10-01 NOTE — Plan of Care (Signed)
Problem: Consults Goal: [facility] General Treatment Patient Education Outcome: Not Progressing Patient remains on 1:1 with staff, remains angry and verbally aggressive at times.

## 2015-10-01 NOTE — Tx Team (Signed)
Interdisciplinary Treatment Plan Update (Adult)  Date:  10/01/2015 Time Reviewed:  11:04 AM  Progress in Treatment: Attending groups: Yes. Participating in groups:  Yes. Taking medication as prescribed:  No. Tolerating medication:  No. Family/Significant othe contact made:  Yes, individual(s) contacted:  patient's husband Patient understands diagnosis:  Yes. Discussing patient identified problems/goals with staff:  Yes. Medical problems stabilized or resolved:  Yes. Denies suicidal/homicidal ideation: Yes. Issues/concerns per patient self-inventory:  Yes. Other:  New problem(s) identified: No, Describe:  none reported  Discharge Plan or Barriers: Patient will stabilize on medications and discharge home with outpatient follow up in [facility].  Reason for Continuation of Hospitalization: Mania Medication stabilization  Comments:   Estimated length of stay: up to 4 days, expected discharge Monday 10/05/15  New goal(s):  Review of initial/current patient goals per problem list:  1. Goal(s): Participate in aftercare plan   Met: Yes  Target date: by discharge  As evidenced by: patient will participate in aftercare plan AEB aftercare provider and housing plan identified at discharge 09/24/15: Patient can discharge home with her husband and follows up in [facility]. Goal met.    2. Goal (s): Decrease mania    Met: No  Target date: by discharge  As evidenced by: patient demonstrates decreased symptoms of mania 09/24/15: Patient is still manic, refuses meds at times, and labile will need continued med management before discharging.  09/29/15: Goal progressing. 10/01/15: Goal progressing.  Pt is on 1 to 1 and in a secured hall    Attendees:  Attendees:  Patient: Stacie Daugherty Family:  Physician: Dr. Bary Leriche, MD 10/01/2015 9:30 AM  Nursing: Carolynn Sayers, RN 10/01/2015 9:30 AM   Clinical Social Worker: Marylou Flesher, [place] 10/01/2015 9:30 AM  Clinical Social Worker: Carmell Austria, LCSW5/08/2015 9:30 AM  Recreational Therapist: Everitt Amber, LRT 10/01/2015 9:30 AM  Other: 10/01/2015 9:30 AM  Other: 10/01/2015 9:30 AM   Alphonse Guild. Daine Gravel, LCAS  10/01/15                                              Scribe for Treatment Team:   Claudine Mouton, MSW, LCSWA  10/01/2015, 11:04 AM   Alphonse Guild. Archie Atilano, LCSWA, LCAS  10/01/15
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
## 2015-10-01 NOTE — Progress Notes (Signed)
Patient with depressed affect, uncooperative behavior with plan of care. Patient remains on 1:1 with staff rt impulsive behavior and verbal aggression. Takes meds this am. Eats meals in small dayroom and watches tv. Patient meets with assistant nurse manager to discuss plan of care for this afternoon for patient to attend afternoon therapy group and to attend outside group time in courtyard. Firm limits set for patient to stay off unit for these 2 groups and limits are no intrusive behavior toward peers plan of care, no cursing and degrading to staff, and no raising of voice to yell at people. Patient verbalizes understanding, safety maintained. Patient with no SI/HI and sleeping at this time. Meets with MD and treatment team.

## 2015-10-01 NOTE — BHH Group Notes (Signed)
[facility] Group Notes:  (Nursing/MHT/Case Management/Adjunct)  Date:  10/01/2015  Time:  4:36 AM  Type of Therapy:  Group Therapy  Participation Level:  Did Not Attend  Summary of Progress/Problems:  Marylynn Pearson 10/01/2015, 4:36 AM

## 2015-10-01 NOTE — BHH Group Notes (Signed)
[facility] LCSW Group Therapy  10/01/2015 11:57 AM  Type of Therapy:  Group Therapy  Participation Level:  Did Not Attend  Summary of Progress/Problems: Patient is manic and labile and not able to participate in group at this time.   Keene Breath, MSW, LCSW  10/01/2015, 11:57 AM
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
## 2015-10-01 NOTE — Progress Notes (Signed)
D: Patient denies SI/HI/AVH.  Patient mood is labile.  Patient is upset about being on a locked unit stating, "I've never been treated like this.  I have been to several hospitals and have never been treated like a caged animal."    Patient did NOT attend evening group. Patient visible on the milieu meeting with her family engaging appropriately.   A: Support and encouragement offered. Scheduled medications given to pt.  Patient refused the trazodone. 1:1 observation continued for patient safety. R: Patient remains safe on the unit.

## 2015-10-01 NOTE — BHH Group Notes (Signed)
[facility] LCSW Group Therapy  10/01/2015 2:17 PM  Type of Therapy:  Group Therapy  Participation Level:  Did Not Attend  Summary of Progress/Problems: Patient is manic and labile and not able to participate in group at this time.   Keene Breath, MSW, LCSW 10/01/2015, 2:17 PM

## 2015-10-01 NOTE — Progress Notes (Signed)
Patient in room screaming stating, "It's not right that I'm locked up.  This is isolation and it is not right."  Patient making verbal insults toward staff.

## 2015-10-02 LAB — HEPATIC FUNCTION PANEL
ALT: 19 U/L (ref 14–54)
AST: 24 U/L (ref 15–41)
Albumin: 3.6 g/dL (ref 3.5–5.0)
Alkaline Phosphatase: 51 U/L (ref 38–126)
Bilirubin, Direct: <0.1 mg/dL — ABNORMAL LOW (ref 0.1–0.5)
Total Bilirubin: 0.5 mg/dL (ref 0.3–1.2)
Total Protein: 7.6 g/dL (ref 6.5–8.1)

## 2015-10-02 NOTE — BHH Group Notes (Signed)
[facility] Group Notes:  (Nursing/MHT/Case Management/Adjunct)  Date:  10/02/2015  Time:  4:09 PM  Type of Therapy:  Psychoeducational Skills  Participation Level:  Did Not Attend    Summary of Progress/Problems:  Stacie Daugherty 10/02/2015, 4:09 PM
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
## 2015-10-02 NOTE — Plan of Care (Signed)
Problem: Alteration in thought process Goal: LTG-Patient verbalizes understanding importance med regimen (Patient verbalizes understanding of importance of medication regimen and need to continue outpatient care.)  Outcome: Progressing Compliant with medications.

## 2015-10-02 NOTE — Progress Notes (Signed)
Patient was out in the day room for breakfast.She was laughing & talking loud,no aggressive behaviors noted.In the group she was loud & interrupting other patients.Patient was sleeping for about 5 hrs.When patient  woke up she wet the bed & that made her upset.She was verbally abusive to Dr.P.States "zyprexa makes me wet the bed."She was sad & tearful.Patient is visible in the milieu with sitter & security.

## 2015-10-02 NOTE — BHH Group Notes (Signed)
[facility] Group Notes:  (Nursing/MHT/Case Management/Adjunct)  Date:  10/02/2015  Time:  1:29 AM  Type of Therapy:  Psychoeducational Skills  Participation Level:  Active  Participation Quality:  Intrusive, Monopolizing and Sharing  Affect:  Excited and Not Congruent  Cognitive:  Lacking  Insight:  Limited  Engagement in Group:  Distracting, Monopolizing, Off Topic and Poor  Modes of Intervention:  Confrontation, Discussion and Exploration  Summary of Progress/Problems:  Stacie Daugherty Stacie Daugherty 10/02/2015, 1:29 AM

## 2015-10-02 NOTE — Plan of Care (Signed)
Problem: Ineffective individual coping Goal: STG: Pt will be able to identify effective and ineffective STG: Pt will be able to identify effective and ineffective coping patterns  Outcome: Not Progressing Pt not able to identify effective coping patterns. Pt blames others

## 2015-10-02 NOTE — [facility] Group Notes (Signed)
[facility] LCSW Group Therapy  10/02/2015 2:01 PM  Type of Therapy:  Group Therapy  Participation Level:  Did Not Attend  Summary of Progress/Problems: Patient is manic and labile and not able to participate in group at this time.   Keene Breath, MSW, LCSW 10/02/2015, 2:01 PM

## 2015-10-02 NOTE — Progress Notes (Signed)
D: Observed pt visiting husband and son with sitter and security present. Patient alert and oriented x4. Patient denies SI/HI/AVH. Pt affect is irritable and labile. Pt was able to visit with husband and son in craft room without any outbursts. Pt went to wrap-up group, and was loud and monopolizing during group. According to MHT, pt was difficult to redirect. Pt was calm and cooperative during snack time. Pt was fixated on tegretol making it difficult for her to sleep. Pt claims her husband is an "undiagnosed schizophrenic" and still uses racial slurs to refer to Dr. Bary Leriche. Pt is very tangential and paranoid during conversation. Pt less verbally agressive this evening with no angry or violent outbursts. Pt fixated on leaving and suing [facility] for her mom's death. Pt having difficulty sleeping A: Offered active listening and support. Provided therapeutic communication. Administered scheduled medications. Discussed the importance of acting appropriately and safely on the unit. Reinforced pt's improving behavior. Encouraged pt to attempt to identify triggers and use coping skills to manage emotions. Administered Ativan later in the evening. R: Pt pleasant and cooperative this evening. Pt refused Trazodone, but compliant with other medications. Pt eventually fell asleep after taking Ativan. Will continue Q15 min. checks. Safety maintained. 1:1 sitter and security present for behavior.

## 2015-10-03 MED ORDER — OLANZAPINE 5 MG PO TBDP
20.0000 mg | ORAL_TABLET | Freq: Every day | ORAL | Status: DC
  Administered 2015-10-03: 20 mg via ORAL
  Filled 2015-10-03: qty 4

## 2015-10-03 NOTE — Progress Notes (Signed)
Pt ate dinner in dayroom, pleasant with staff and other patients, pt laughs inappropriately at times and uses excessively loud speech but is redirectable.

## 2015-10-03 NOTE — Progress Notes (Signed)
   10/03/15 1900  Clinical Encounter Type  Visited With Patient  Visit Type Follow-up  Referral From Patient  Consult/Referral To Chaplain  Spiritual Encounters  Spiritual Needs Prayer;Emotional  Stress Factors  Patient Stress Factors Major life changes;Loss of control  Chaplain visited with patient as requested by patient and provided pastoral care and counseling. Patient could barely stay awoke during visit and appeared to hallucinate as we talked. She held hands out as to fold a napkin which she didn't have and to reach for objects that weren't on the table. She stated that she needed to talk to the doctor about this because it continues to happen each time she takes medication.    [place] [phone]

## 2015-10-03 NOTE — Progress Notes (Signed)
1:1 Note-Pt pleasant during interaction, denies SI/HI/AVH, pt went to dayroom for snack, ambulating with walker assistance, pt feeling very sleepy/drowsy, 1:1 with sitter and security continued for safety.

## 2015-10-03 NOTE — Progress Notes (Signed)
1:1 Note-Pt sitting in wheelchair in dayroom on green hall, lunch tray given, pt currently sleeping, respirations even unlabored, no distress noted, 1:1 with sitter and security continued for safety.

## 2015-10-03 NOTE — Progress Notes (Signed)
1:1 Note--pt yelling in hallway, upset and argumentative, demanding a new Doctor and states that she will not take the Zyprexa anymore because "I don't want anything that incompetent [place] bitch ordered for me", verbal deescalation initiated, with encouragement pt did take her medications but threw the empty pill cup at the administering RN/writer, MD notified.

## 2015-10-03 NOTE — Progress Notes (Signed)
D: Observed pt visiting with husband and family. Patient alert and oriented x4. Patient denies SI/HI/AVH. Pt affect remains labile and irritable. Pt was very drowsy this evening, and was almost sleeping in her chair towards the end of her family visit. Pt voiced concerns about taking Zyprexa, and indicated that she would not take it in the future. Pt did not act aggressive toward staff this pm. Pt got into verbal argument with a peer, but pt was able to be redirected. Pt had a few crying spells, and still insists that the doctor is not prescribing medicines as she should. A: Offered active listening and support. Provided therapeutic communication. Administered scheduled medications. Encouraged pt to appropriately and respectfully discuss medication concerns with doctor. Encouraged the use of self control when having disagreements with others.  R: Pt  Cooperative and redirectable. Pt continues to refuse trazodone, but took other medicatons. Will continue Q15 min. checks. Safety maintained. 1:1 sitter with security maintained for behavior.

## 2015-10-03 NOTE — [facility] Group Notes (Signed)
[facility] LCSW Group Therapy  10/03/2015 1:57 PM  Type of Therapy:  Group Therapy  Participation Level:  Did Not Attend  Modes of Intervention:  Discussion, Education, Socialization and Support  Summary of Progress/Problems: Pt will identify unhealthy thoughts and how they impact their emotions and behavior. Pt will be encouraged to discuss these thoughts, emotions and behaviors with the group.   [place] MSW, [place]  10/03/2015, 1:57 PM

## 2015-10-03 NOTE — Progress Notes (Signed)
Patient observed to be asleep in chair. Chest rise and fall noted.

## 2015-10-03 NOTE — Progress Notes (Signed)
1:1 Note- Pt visited with family this evening in the craft room, pleasant during interaction, ambulating with walker, 1:1 with security and sitter continued for safety, pt has no complaints at this time.

## 2015-10-03 NOTE — Progress Notes (Signed)
1:1 Note-allowed pt to go outside for a few minutes of fresh air, no complaints at this time, patient ambulating in hallways and outside with walker assistance, 1:1 with sitter and security continued for safety.

## 2015-10-03 NOTE — Progress Notes (Signed)
1:1 Note-Pt still sitting in dayroom on green hallway, sleeping, no distress noted, respirations even and unlabored, 1:1 with sitter and security continued for safety.
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
## 2015-10-03 NOTE — Progress Notes (Signed)
1:1 Note-pt requested to speak with Chaplain, paged Chaplain on call, they are coming to see patient, no complaints at this time, 1:1 with sitter and security continued for patient safety.

## 2015-10-03 NOTE — Progress Notes (Signed)
1:1 Note- Pt ate breakfast in dayroom, ambulates with supervision and walker assistancew, 1:1 sitter and security continued for safety.

## 2015-10-03 NOTE — Progress Notes (Signed)
1:1 Note-Pt met with Dr. Mamie Nick, they discussed changing the Zyprexa to be given only at bedtime and this made the patient very happy, she states that the Zyprexa was making her too sleepy during the day and she is glad that the doctor was willing to change it to a bedtime dose, 1:1 with sitter and security continued for safety.

## 2015-10-03 NOTE — Plan of Care (Signed)
Problem: Alteration in thought process Goal: STG-Patient is able to follow short directions Outcome: Progressing Pt able to follow directions and has been redirectable this shift

## 2015-10-03 NOTE — Progress Notes (Signed)
Patient woke up briefly, teary about her mother. Stated that her mother does not deserve being in a nursing home. Patient hyper verbal despite being sleepy. Ate two bites of food between falling asleep.

## 2015-10-04 MED ORDER — OLANZAPINE 5 MG PO TBDP
10.0000 mg | ORAL_TABLET | Freq: Every day | ORAL | Status: DC
  Administered 2015-10-04: 10 mg via ORAL
  Filled 2015-10-04: qty 2

## 2015-10-04 NOTE — Plan of Care (Signed)
Problem: Alteration in thought process Goal: LTG-Patient verbalizes understanding importance med regimen (Patient verbalizes understanding of importance of medication regimen and need to continue outpatient care.)  Outcome: Progressing Patient compliant with medications.

## 2015-10-04 NOTE — [facility] Group Notes (Signed)
[facility] LCSW Group Therapy  10/04/2015 12:52 PM  Type of Therapy:  Group Therapy  Participation Level:  Did Not Attend  Modes of Intervention:  Discussion, Education, Socialization and Support  Summary of Progress/Problems: Boundaries: Patients defined boundaries and discussed the importance of them. Patients identified their own boundaries and how they feel when they are crossed. Patients discussed ways to create and/ or improve their personal boundaries.    [place] MSW, [place]  10/04/2015, 12:52 PM

## 2015-10-04 NOTE — Progress Notes (Signed)
Patient was cooperative with the treatment today.She was appropriate in the milieu except some loud laughs.Compliant with medications.Patient took a 3hr nap this afternoon.She was happy because she did not wet the bed today.Patient uses walker for ambulation.Appetite good.Still with 1:1 sitter.

## 2015-10-04 NOTE — Progress Notes (Signed)
[place] remains on 1:1 with sitter and security. She still requires some redirection at times. She refused to take her Trazodone on shift. She was visible in the dayroom during on evening shift and had interaction with peers. She seemed to be fighting sleep for a while around 2300 given PRN Benadryl. Patient appears to be resting in bed quietly at this time.
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
## 2015-10-04 NOTE — B[facility] Group Notes (Signed)
[facility] Group Notes:  (Nursing/MHT/Case Management/Adjunct)  Date:  10/04/2015  Time:  9:42 PM  Type of Therapy:  Group Therapy  Participation Level:  Active  Participation Quality:  Appropriate  Affect:  Appropriate  Cognitive:  Appropriate  Insight:  Appropriate  Engagement in Group:  Engaged  Modes of Intervention:  Discussion  Summary of Progress/Problems:  Kandis Fantasia 10/04/2015, 9:42 PM

## 2015-10-05 LAB — HEPATIC FUNCTION PANEL
ALT: 21 U/L (ref 14–54)
AST: 25 U/L (ref 15–41)
Albumin: 3.7 g/dL (ref 3.5–5.0)
Alkaline Phosphatase: 47 U/L (ref 38–126)
Bilirubin, Direct: <0.1 mg/dL — ABNORMAL LOW (ref 0.1–0.5)
Total Bilirubin: 0.4 mg/dL (ref 0.3–1.2)
Total Protein: 7.9 g/dL (ref 6.5–8.1)

## 2015-10-05 LAB — AMMONIA: Ammonia: <9 umol/L — ABNORMAL LOW (ref 9–35)

## 2015-10-05 MED ORDER — OLANZAPINE 5 MG PO TBDP
20.0000 mg | ORAL_TABLET | Freq: Every day | ORAL | Status: DC
  Administered 2015-10-05: 20 mg via ORAL
  Filled 2015-10-05: qty 4

## 2015-10-05 MED ORDER — AMLODIPINE BESYLATE 5 MG PO TABS
5.0000 mg | ORAL_TABLET | Freq: Every day | ORAL | Status: DC
  Administered 2015-10-06 – 2015-10-07 (×2): 5 mg via ORAL
  Filled 2015-10-05 (×2): qty 1

## 2015-10-05 MED ORDER — FUROSEMIDE 20 MG PO TABS
40.0000 mg | ORAL_TABLET | Freq: Every day | ORAL | Status: DC
  Administered 2015-10-06 – 2015-10-07 (×2): 40 mg via ORAL
  Filled 2015-10-05 (×2): qty 2

## 2015-10-05 NOTE — Plan of Care (Signed)
Problem: Aggression Towards others,Towards Self, and or Destruction Goal: LTG - No aggression,physical/verbal/destruction prior to D/C (Patient will have no episodes of physical or verbal aggression or property destruction towards self or others for _____ day (s) prior to discharge.)  Outcome: Progressing No aggressive behavior noted. Admits that she was aggressive to others previously. Currently pleasant and cooperative.
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
## 2015-10-05 NOTE — Plan of Care (Signed)
Problem: Aggression Towards others,Towards Self, and or Destruction Goal: STG-Patient will comply with prescribed medication regimen (Patient will comply with prescribed medication regimen)  Outcome: Progressing Patient was compliant with medication this evening.
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
## 2015-10-05 NOTE — Plan of Care (Signed)
Problem: Alteration in thought process Goal: LTG-Patient behavior demonstrates decreased signs psychosis (Patient behavior demonstrates decreased signs of psychosis to the point the patient is safe to return home and continue treatment in an outpatient setting.)  Outcome: Progressing Patient is less psychotic, able to control her reactions, following staff directions. Alert and oriented to the milieu, understands her condition

## 2015-10-05 NOTE — Progress Notes (Signed)
1930: patient currently in the dayroom, visiting with her family. Alert and oriented, pleasant and cooperative. Reports that she is feeling better with improved mood. Family is supportive. Currently patient has no concern. Support and encouragements provided and safety maintained.  2200: Patient stayed in the milieu, sitter beside her. Attended group, had a snack and presented to the medication room. Took all her medications voluntarily. Currently in her room, preparing to sleep. Safety precautions maintained, sitter at bedside.  0300: Patient currently in bed sleeping soundly, sitter at bedside.  0500: Patient has remained asleep. No sign of discomfort noted. Safety maintained.

## 2015-10-05 NOTE — BHH Group Notes (Signed)
[facility] Group Notes:  (Nursing/MHT/Case Management/Adjunct)  Date:  10/05/2015  Time:  4:46 PM  Type of Therapy:  Psychoeducational Skills  Participation Level:  Active  Participation Quality:  Sharing  Affect:  Tearful  Cognitive:  Appropriate  Insight:  Improving  Engagement in Group:  Engaged  Modes of Intervention:  Activity  Summary of Progress/Problems:  Nehemiah Settle 10/05/2015, 4:46 PM

## 2015-10-05 NOTE — Progress Notes (Signed)
Recreation Therapy Notes  Date: 05.08.17 Time: 9:30 am Location: Craft Room  Group Topic: Self-expression  Goal Area(s) Addresses:  Patient will identify one color per emotion listed on the wheel. Patient will verbalize one emotion experienced during session. Patient will be educated on other forms of self-expression.  Behavioral Response: Attentive, Interactive  Intervention: Emotion Wheel  Activity: Patients were given an Emotion Wheel worksheet and instructed to pick a color for each emotion listed.  Education: LRT educated group on other forms of self-expression.  Education Outcome: In group clarification offered  Clinical Observations/Feedback: Patient completed activity by picking a color for each emotion. Patient contributed to group discussion by stating some of the colors she picked for certain emotions. Patient was talking to herself during group activity.   Leonette Monarch, LRT/CTRS 10/05/2015 10:20 AM
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
## 2015-10-05 NOTE — BHH Group Notes (Signed)
[place] LCSW Group Therapy  10/05/2015 3:31 PM  Type of Therapy:  Group Therapy  Participation Level:  Active  Participation Quality:  Intrusive, Monopolizing and Redirectable  Affect:  Labile  Cognitive:  Disorganized  Insight:  Limited  Engagement in Therapy:  Limited and Monopolizing  Modes of Intervention:  Socialization and Support  Summary of Progress/Problems: Patient attended group and participated introducing herself and shared during an introductory exercise that if she were an animal, she would be "a white seal". Patient is manic and hyper-verbal monopolizing but redirectable. Patient believes that her husband is having her placed in the hospital and could have just taken her to [facility] in [place] for outpatient rather than inpatient. Patient also states that her husband is sick and should be int he hospital rather than her but this is probably delusional as this Probation officer has spoken with patient's husband and he does not appear to have mental health symptoms but is a concerned husband.   Keene Breath, MSW, LCSW 10/05/2015, 3:31 PM

## 2015-10-06 LAB — URINALYSIS COMPLETE WITH MICROSCOPIC (ARMC ONLY)
Bacteria, UA: NONE SEEN
Bilirubin Urine: NEGATIVE
Glucose, UA: NEGATIVE mg/dL
Hgb urine dipstick: NEGATIVE
Ketones, ur: NEGATIVE mg/dL
Leukocytes, UA: NEGATIVE
Nitrite: NEGATIVE
Protein, ur: NEGATIVE mg/dL
RBC / HPF: NONE SEEN RBC/hpf (ref 0–5)
Specific Gravity, Urine: 1.016 (ref 1.005–1.030)
pH: 7 (ref 5.0–8.0)

## 2015-10-06 MED ORDER — TEMAZEPAM 30 MG PO CAPS: 30.0000 mg | ORAL_CAPSULE | Freq: Every day | ORAL | Status: DC

## 2015-10-06 MED ORDER — ARIPIPRAZOLE ER 400 MG IM SUSR: 400.0000 mg | INTRAMUSCULAR | Status: DC

## 2015-10-06 MED ORDER — OXYBUTYNIN CHLORIDE ER 10 MG PO TB24
10.0000 mg | ORAL_TABLET | Freq: Every day | ORAL | Status: DC
  Administered 2015-10-06: 10 mg via ORAL
  Filled 2015-10-06 (×3): qty 1

## 2015-10-06 MED ORDER — OLANZAPINE 10 MG PO TBDP: 10.0000 mg | ORAL_TABLET | Freq: Every day | ORAL | Status: DC

## 2015-10-06 MED ORDER — WHITE PETROLATUM GEL
Status: DC | PRN
  Filled 2015-10-06: qty 5

## 2015-10-06 MED ORDER — OXYBUTYNIN CHLORIDE ER 10 MG PO TB24: 10.0000 mg | ORAL_TABLET | Freq: Every day | ORAL | Status: DC

## 2015-10-06 MED ORDER — CARBAMAZEPINE 200 MG PO TABS: 200.0000 mg | ORAL_TABLET | Freq: Two times a day (BID) | ORAL | Status: AC

## 2015-10-06 MED ORDER — OLANZAPINE 5 MG PO TBDP
10.0000 mg | ORAL_TABLET | Freq: Every day | ORAL | Status: DC
Start: 2015-10-06 — End: 2015-10-07
  Administered 2015-10-06: 10 mg via ORAL
  Filled 2015-10-06: qty 2

## 2015-10-06 NOTE — Progress Notes (Deleted)
1:1 Note- dayroom

## 2015-10-06 NOTE — Plan of Care (Signed)
Problem: Alteration in thought process Goal: LTG-Patient is able to perceive the environment accurately Outcome: Progressing When asked if she's ready for her medications she is able to locate the medication room

## 2015-10-06 NOTE — Progress Notes (Signed)
1:1 Notes- Pt in dayroom eating lunch, also used phone to call her family after lunch, still loud and hyper-verbal, intrusive with other patients at times, needs redirection, 1:1 observation continued for safety.

## 2015-10-06 NOTE — Progress Notes (Signed)
1:1 Note- Pt went to afternoon group with MHT, pt participates in group but needs redirection and boundary setting, tends to talk over other people at times, no distress noted, ambulates with walker, 1:1 observation continued for safety.
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
## 2015-10-06 NOTE — Progress Notes (Signed)
1:1 Note- pt went to group, monopolizes at times in group, hyper-verbal, no distress noted, 1:1 with sitter continued for safety.

## 2015-10-06 NOTE — BHH Suicide Risk Assessment (Addendum)
[facility] Discharge Suicide Risk Assessment   Principal Problem: Bipolar disorder, curr episode mixed, severe, with psychotic features [facility]) Discharge Diagnoses:  Patient Active Problem List   Diagnosis Date Noted  . Bipolar disorder, curr episode mixed, severe, with psychotic features ([place]) [F31.64] 08/26/2015  . History of posttraumatic stress disorder (PTSD) [Z86.59] 08/26/2015  . Essential hypertension [I10] 08/26/2015  . History of eczema [Z87.2] 04/08/2015  . Pedal edema [R60.0] 08/06/2014  . Positive ANA (antinuclear antibody) [R76.8] 12/19/2012  . Biological false positive RPR test [R76.8] 08/18/2012  . HYPERCHOLESTEROLEMIA [E78.00] 04/28/2009  . HIDRADENITIS SUPPURATIVA [L73.2] 09/06/2007  . OBESITY [E66.9] 04/17/2007    Total Time spent with patient: 30 minutes  Musculoskeletal: Strength & Muscle Tone: within normal limits Gait & Station: normal Patient leans: N/A  Psychiatric Specialty Exam: Review of Systems  All other systems reviewed and are negative.   Blood pressure 138/96, pulse 89, temperature 98.3 F (36.8 C), temperature source Oral, resp. rate 20, height 5\' 4"  (1.626 m), weight 102.059 kg (225 lb), SpO2 100 %.Body mass index is 38.6 kg/(m^2).  General Appearance: Casual  Eye Contact::  Good  Speech:  Clear and [id number]  Volume:  Normal  Mood:  Euphoric  Affect:  Appropriate  Thought Process:  Goal Directed  Orientation:  Full (Time, Place, and Person)  Thought Content:  WDL  Suicidal Thoughts:  No  Homicidal Thoughts:  No  Memory:  Immediate;   Fair Recent;   Fair Remote;   Fair  Judgement:  Impaired  Insight:  Fair and Shallow  Psychomotor Activity:  Normal  Concentration:  Fair  Recall:  [employer] [place]  Language: Fair  Akathisia:  No  Handed:  Right  AIMS (if indicated):     Assets:  Communication Skills Desire for Improvement Financial Resources/Insurance Housing Intimacy Physical Health Resilience Social  Support Talents/Skills Transportation  Sleep:  Number of Hours: 6  Cognition: WNL  ADL's:  Intact   Mental Status Per Nursing Assessment::   On Admission:     Demographic Factors:  Unemployed  Loss Factors: Decrease in vocational status  Historical Factors: Family history of mental illness or substance abuse and Impulsivity  Risk Reduction Factors:   Responsible for children under 53 years of age, Sense of responsibility to family, Living with another person, especially a relative, Positive social support and Positive therapeutic relationship  Continued Clinical Symptoms:  Bipolar Disorder:   Mixed State  Cognitive Features That Contribute To Risk:  None    Suicide Risk:  Minimal: No identifiable suicidal ideation.  Patients presenting with no risk factors but with morbid ruminations; may be classified as minimal risk based on the severity of the depressive symptoms  Follow-up Information    Follow up with [facility].   Contact information:   [address] [place], [place] Ph [phone] Fax [phone] (walk in hours M-F 8am-4pm)       Plan Of Care/Follow-up recommendations:  Activity:  As tolerated. Diet:  Low sodium heart healthy. Other:  Keep follow-up appointments.  Orson Slick, MD 10/06/2015, 4:43 PM

## 2015-10-06 NOTE — Progress Notes (Signed)
1:1 Note- Pt came to med room to take her medications, med compliant, pt states that she wet the bed last night and thinks that the "Zyprexa dose is too much, I was only supposed to get 10mg  instead of 20mg ", MD made aware, 1:1 continued for safety, no other complaints at this time, pt remains safe, ambulates with walker assistance.

## 2015-10-06 NOTE — BHH Group Notes (Deleted)
[facility] Group Notes:  (Nursing/MHT/Case Management/Adjunct)  Date:  10/06/2015  Time:  2:28 PM  Type of Therapy:  Psychoeducational Skills  Participation Level:  Active  Participation Quality:  Appropriate  Affect:  Anxious and Blunted  Cognitive:  Appropriate  Insight:  Appropriate  Engagement in Group:  Distracting, Engaged and Improving  Modes of Intervention:  Discussion and Education  Summary of Progress/Problems:  Stacie Daugherty 10/06/2015, 2:28 PM

## 2015-10-06 NOTE — Progress Notes (Signed)
D: Patient remains on a 1:1 sitter. She has been appropriate throughout the evening. One outburst when staff couldn't immediately find her clothes but she was rediretable. She states she's been tearful but then singing today because she's excited about going home. Denies SI/HI/AVH. States she has pain in her mouth on the right side but refuses any PRNs. A: Medication was given with education. Encouragement was provided.  R: Patient was compliant with all medications. She has remained calm and cooperative. Safety maintained with sitter and 15 min checks.
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
## 2015-10-06 NOTE — Tx Team (Addendum)
Interdisciplinary Treatment Plan Update (Adult)  Date:  10/06/2015 Time Reviewed:  11:14 AM  Progress in Treatment: Attending groups: Yes. Participating in groups:  Yes. Taking medication as prescribed:  No. Tolerating medication:  No. Family/Significant othe contact made:  Yes, individual(s) contacted:  patient's husband Patient understands diagnosis:  Yes. Discussing patient identified problems/goals with staff:  Yes. Medical problems stabilized or resolved:  Yes. Denies suicidal/homicidal ideation: Yes. Issues/concerns per patient self-inventory:  Yes. Other:  New problem(s) identified: No, Describe:  none reported  Discharge Plan or Barriers: Patient will stabilize on medications and discharge home with outpatient follow up in [facility].  Reason for Continuation of Hospitalization: Mania Medication stabilization  Comments:   Estimated length of stay: up to 4 days, expected discharge Monday 10/09/15  New goal(s):  Review of initial/current patient goals per problem list:  1. Goal(s): Participate in aftercare plan   Met: Yes  Target date: by discharge  As evidenced by: patient will participate in aftercare plan AEB aftercare provider and housing plan identified at discharge 09/24/15: Patient can discharge home with her husband and follows up in [facility]. Goal met.    2. Goal (s): Decrease mania    Met: No  Target date: by discharge  As evidenced by: patient demonstrates decreased symptoms of mania 09/24/15: Patient is still manic, refuses meds at times, and labile will need continued med management before discharging.  09/29/15: Goal progressing. 10/01/15: Goal progressing.  Pt is on 1 to 1 and in a secured hall 5/9: Goal progressing.  Pt is still on 1 to 1     Attendees:  Attendees:  Patient: Stacie Daugherty Family:  Physician: Dr. Bary Leriche, MD    10/06/2015 9:30 AM  Nursing: Polly Cobia, RN       10/06/2015 9:30 AM  Clinical Social  Worker: Marylou Flesher, [place]  10/06/2015 9:30 AM  Clinical Social Worker:, LCSW   10/06/2015 9:30 AM  Recreational Therapist: Everitt Amber, LRT   10/06/2015 9:30 AM  Psychologist: Consuella Lose, PsyD  10/06/2015 9:30 AM                                               Scribe for Treatment Team:   Claudine Mouton, MSW, LCSWA  10/06/2015, 11:14 AM   Alphonse Guild. Nikeisha Klutz, LCSWA, LCAS  10/01/15

## 2015-10-06 NOTE — Progress Notes (Signed)
1:1 Note- Pt eating dinner in dayroom, also ambulated with walker to phone to speak with family, pt states "i am going home tomorrow did you hear?!", per patient Dr. Bary Leriche told her she planned to discharge her tomorrow, 1:1 observation continued for safety, pt remains safe, no distress or complaints at this time.

## 2015-10-06 NOTE — Progress Notes (Signed)
1:1 Note- Pt in dayroom interacting with other patients, ate snack, tearful after she came out of group, states that she wants to go home "I am tired of being here", emotional support provided, 1:1 observation continued for safety.

## 2015-10-06 NOTE — Progress Notes (Signed)
1:1 Note- Pt in dayroom interacting with other patients, no distress noted, 1:1 observation continued for safety.

## 2015-10-06 NOTE — Progress Notes (Signed)
1:1 Note- Pt sitting in the dayroom, interacting with others, singing and intrusive with other patients and staff, labile mood, 1:1 with sitter continued for safety, pt remains safe, no distress noted, pt eating breakfast.
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
## 2015-10-06 NOTE — BHH Group Notes (Signed)
[facility] LCSW Aftercare Discharge Planning Group Note   10/06/2015 11:38 AM  Participation Quality:  Patient attended and participated in group discussion introducing herself and sharing that her SMART goal is to "discharge ASAP". Patient rated her depression and anxiety a 5 but wanted to change it once facilitator explained that staff would like to see a 3 or less by discharge. Facilitator then explained that depression and anxiety ratings are not the only tools used for deciding discharge. Patient was given a daily workbook on Recovery and ensured patient was informed of LOS, rules for milieu, roles of staff, and ensured patient was aware of their psychiatrist and social worker names.   Mood/Affect:  Labile  Depression Rating:  5  Anxiety Rating:  5  Thoughts of Suicide:  No Will you contract for safety?   NA  Current AVH:  No  Plan for Discharge/Comments:  Home with husband  Transportation Means: husband will pick up  Supports:husband, kids, Wayland Denis, Olevia Perches, MSW, LCSW

## 2015-10-06 NOTE — Progress Notes (Signed)
1:1 Note- Pt came up to nurses station demanding that Dr. Bary Leriche change her order for Zyprexa to be given during the day instead of at night since she wet the bed last night, pt then went over and knocked on Dr. Lucy Antigua office door and demanded that she change the Zyprexa to be given during the day, redirected patient, Dr. Bary Leriche plans to discharge pt tomorrow, 1:1 observation continued for safety.

## 2015-10-06 NOTE — Progress Notes (Signed)
1:1 Note- Pt in craft room for recreational group, no distress noted, no complaints at this time, 1:1 observation continued for safety.

## 2015-10-06 NOTE — BHH Group Notes (Signed)
[facility] Group Notes:  (Nursing/MHT/Case Management/Adjunct)  Date:  10/06/2015  Time:  2:32 PM  Type of Therapy:  Psychoeducational Skills  Participation Level:  Active  Participation Quality:  Intrusive  Affect:  Blunted  Cognitive:  Oriented  Insight:  Appropriate  Engagement in Group:  Distracting and Engaged  Modes of Intervention:  Discussion and Education  Summary of Progress/Problems:  Stacie Daugherty 10/06/2015, 2:32 PM

## 2015-10-06 NOTE — Progress Notes (Signed)
Recreation Therapy Notes  Date: 05.09.17 Time: 9:30 am Location: Craft Room  Group Topic: Coping skills  Goal Area(s) Addresses:  Patient will effectively use art as a coping skill. Patient will be able to identify one emotion experienced during group.  Behavioral Response: Attentive, Interactive, Tearful, Intermittently disruptive  Intervention: Two Faces of Me  Activity: Patients were given a blank face worksheet and instructed to draw or write how they felt when they were admitted to the hospital on one side of the face and draw or write how they wanted to feel when they are d/c on the other side of the face.  Education: LRT educated patients on healthy coping skills.  Education Outcome: In group clarification offered   Clinical Observations/Feedback: On her way to group, patient told peer who was eating breakfast she had to come to group. Sitter and LRT informed patient she did not need to and patient needed to focus on herself. Patient drew how she felt when she was admitted to the hospital and how she wanted to feel when she was d/c. Patient was tearful stating she just wanted to go home and did not understand why she could not go home. Patient did tell peers what to do and was redirected by sitter. Patient contributed to group discussion by stating an emotion she felt and things she had learned in the hospital to help her when she can go home.  Leonette [facility], LRT/CTRS 10/06/2015 10:24 AM

## 2015-10-07 NOTE — BHH Group Notes (Signed)
[facility] LCSW Group Therapy  10/07/2015 10:56 AM  Type of Therapy:  Group Therapy  Participation Level:  Active  Participation Quality:  Appropriate and Attentive  Affect:  Appropriate  Cognitive:  Alert, Appropriate and Oriented  Insight:  Engaged  Engagement in Therapy:  Engaged  Modes of Intervention:  Discussion, Socialization and Support  Summary of Progress/Problems: Patient attended and participated in group discussion appropriately introducing herself and sharing during an introductory exercise that her Dream Vacation would be to "[place] to [place] and see lions, zebras and elephants". Patient shared that her life is out of Balance due to changes in medications and also the recent loss of her mother and her father being very sick with cancer and not wanting family to see him. Patient received support from other group members that also have experienced changes in their lives. Patient is much improved from previous groups and much less disorganized making sense in her discussions.  Keene Breath, MSW, LCSW 10/07/2015, 10:56 AM

## 2015-10-07 NOTE — Progress Notes (Signed)
Patient reported that one of her dental crowns broke.  The piece of crown was put in a specimen bottle and left in the patient's possession.  Patient stated that the crown had been cracked and it felt better after the piece broke off.

## 2015-10-07 NOTE — BHH Group Notes (Signed)
[facility] LCSW Group Therapy  10/07/2015 2:31 PM  Type of Therapy:  Group Therapy   Summary of Progress/Problems:  Patient attended and participated in group and was attentive during the movie shown "The Happy Movie" which is a documentary about what truly makes people happy and how to measure happiness. Patient was attentive throughout the group session and introduced herself sharing that from the movie she realized that "money and business is not always healthy" as patient saw in [place] that a booming economy there were a lot of unhappy and tired people working so hard that they were sleeping very little.   Keene Breath, MSW, LCSW 10/07/2015, 2:31 PM

## 2015-10-07 NOTE — Progress Notes (Addendum)
  [facility] Adult Case Management Discharge Plan :  Will you be returning to the same living situation after discharge:  Yes,  pt will be discharging to [facility] to follow up with outpatient treatment, medication managment and therapy At discharge, do you have transportation home?: Yes,  pt will be picked up by her husband Do you have the ability to pay for your medications: Yes,  pt will be provided with prescriptions at discharge  Release of information consent forms completed and in the chart;  Patient's signature needed at discharge.  Patient to Follow up at: Follow-up Information    Follow up with [facility].   Why:  Please contact [facility] upon discharge to schedule your hospital follow up appointment or arrive at the walk-in clinic between the hours of 8am-3pm, Monday thru Friday for medication management and therapy with Dr. Rondel Oh information:   [place], [place] Ph [phone] Fax [phone] (walk in hours M-F 8am-4pm)       Next level of care provider has access to [place] and Suicide Prevention discussed: Yes,  completed with pt  Have you used any form of tobacco in the last 30 days? (Cigarettes, Smokeless Tobacco, Cigars, and/or Pipes): No  Has patient been referred to the Quitline?: N/A patient is not a smoker  Patient has been referred for addiction treatment: N/A  Stacie Daugherty 10/07/2015, 11:04 AM

## 2015-10-07 NOTE — Tx Team (Addendum)
Interdisciplinary Treatment Plan Update (Adult)  Date:  10/07/2015 Time Reviewed:  10:44 AM  Progress in Treatment: Attending groups: Yes. Participating in groups:  Yes. Taking medication as prescribed:  No. Tolerating medication:  No. Family/Significant othe contact made:  Yes, individual(s) contacted:  patient's husband Patient understands diagnosis:  Yes. Discussing patient identified problems/goals with staff:  Yes. Medical problems stabilized or resolved:  Yes. Denies suicidal/homicidal ideation: Yes. Issues/concerns per patient self-inventory:  Yes. Other:  New problem(s) identified: No, Describe:  none reported  Discharge Plan or Barriers: Patient will stabilize on medications and discharge home with outpatient follow up with [facility] in [place] for medication management and therapy.  Reason for Continuation of Hospitalization: Mania Medication stabilization  Comments:   Estimated date of discharge: 10/07/15  New goal(s):  Review of initial/current patient goals per problem list:  1. Goal(s): Participate in aftercare plan   Met: Yes  Target date: by discharge  As evidenced by: patient will participate in aftercare plan AEB aftercare provider and housing plan identified at discharge 09/24/15: Patient can discharge home with her husband and follows up in [facility]. Goal met.  5/10: Patient will stabilize on medications and discharge home with outpatient follow up with [facility] in [facility] for medication management and therapy.   2. Goal (s): Decrease mania    Met: Adequate for discharge per MD. Target date: by discharge  As evidenced by: patient demonstrates decreased symptoms of mania 09/24/15: Patient is still manic, refuses meds at times, and labile will need continued med management before discharging.  09/29/15: Goal progressing. 10/01/15: Goal progressing.  Pt is on 1 to 1 and in a secured hall 5/9: Goal  progressing.  Pt is still on 1 to 1 5/10: Adequate for discharge per MD.     Attendees:  Attendees:  Patient:                                                                       10/07/2015 Family:  Physician: Dr. Bary Leriche, MD    10/07/2015 9:30 A  Nursing: Polly Cobia, RN       10/07/2015 9:30 AM  Clinical Social Worker: Marylou Flesher, [place]  10/07/2015 9:30 AM  Clinical Social Worker:, LCSW   10/07/2015 9:30 AM  Recreational Therapist:                            10/07/2015 9:30 AM  Psychologist: , PsyD                                       10/07/2015 9:30 AM                                               Scribe for Treatment Team:   Claudine Mouton, MSW, LCSWA  10/07/2015, 10:44 AM   Alphonse Guild. Thaine Garriga, LCSWA, LCAS  10/07/15

## 2015-10-07 NOTE — Progress Notes (Signed)
D:  Patient expresses readiness for discharge today.  Patient denies any pain currently.  Patient denies suicidal ideation, homicidal ideation, auditory or visual hallucinations currently. A:  Medications and instructions for their use were reviewed with the patient and she voiced understanding.  Discharge instructions and follow up were reviewed with the patient.  Patient's belongings were returned upon her leaving the unit. R:  Patient signed for the return of her belongings.  Patient was cooperative with the discharge process.  Patient expressed understanding of discharge instructions, follow up, medications and their use.  Patient was escorted off the unit.  Patient remains safe at the time of discharge.

## 2015-10-08 ENCOUNTER — Telehealth: Payer: Self-pay

## 2015-10-08 DIAGNOSIS — I10 Essential (primary) hypertension: Secondary | ICD-10-CM

## 2015-10-08 MED ORDER — AMLODIPINE BESYLATE 5 MG PO TABS: 5.0000 mg | ORAL_TABLET | Freq: Every day | ORAL | Status: DC

## 2015-10-08 MED ORDER — OXYBUTYNIN CHLORIDE ER 10 MG PO TB24: 10.0000 mg | ORAL_TABLET | Freq: Every day | ORAL | Status: DC

## 2015-10-08 MED ORDER — CARVEDILOL 3.125 MG PO TABS: 3.1250 mg | ORAL_TABLET | Freq: Every day | ORAL | Status: DC

## 2015-10-08 MED ORDER — FUROSEMIDE 40 MG PO TABS: 40.0000 mg | ORAL_TABLET | Freq: Every day | ORAL | Status: DC

## 2015-10-08 NOTE — Telephone Encounter (Signed)
Refills sent into pharmacy. Thanks!  

## 2015-10-08 NOTE — Telephone Encounter (Signed)
Pt is requesting medication refill on the following: Amlodipine, 5mg ; Carvedilol, 3.125mg ; Furosemide, 40mg ; Oxybutynin, 10mg . She would like them sent to [facility] ([address]). Thanks!

## 2015-10-16 ENCOUNTER — Telehealth: Payer: Self-pay

## 2015-10-16 ENCOUNTER — Other Ambulatory Visit: Payer: Self-pay

## 2015-10-16 MED ORDER — VITAMIN D (ERGOCALCIFEROL) 1.25 MG (50000 UNIT) PO CAPS: 50000.0000 [IU] | ORAL_CAPSULE | ORAL | Status: DC

## 2015-10-16 NOTE — Telephone Encounter (Signed)
This has been sent into pharmacy. Thanks!  

## 2016-01-18 ENCOUNTER — Other Ambulatory Visit: Payer: Self-pay

## 2016-01-18 ENCOUNTER — Telehealth: Payer: Self-pay | Admitting: Family Medicine

## 2016-01-18 DIAGNOSIS — I10 Essential (primary) hypertension: Secondary | ICD-10-CM

## 2016-01-18 MED ORDER — CARVEDILOL 3.125 MG PO TABS: 3.1250 mg | ORAL_TABLET | Freq: Every day | ORAL | 2 refills | Status: DC

## 2016-01-18 MED ORDER — AMLODIPINE BESYLATE 5 MG PO TABS: 5.0000 mg | ORAL_TABLET | Freq: Every day | ORAL | 2 refills | Status: DC

## 2016-01-18 NOTE — Telephone Encounter (Signed)
Received a refill request for amlodipine, carvedilol, and furosemide. This has been sent into pharmacy. Thanks!

## 2016-01-18 NOTE — Telephone Encounter (Signed)
Patient now has Medicaid and would like to get a colonoscopy referral for screening purposes and she would like a dermatological referral for eczema. Please call her on her cell to advise her if we can refer. Thanks

## 2016-01-18 NOTE — Telephone Encounter (Signed)
Called left message asking for patient call back and make an appointment for referrals. Thanks!!

## 2016-02-23 ENCOUNTER — Other Ambulatory Visit: Payer: Self-pay | Admitting: Family Medicine

## 2016-02-23 DIAGNOSIS — I10 Essential (primary) hypertension: Secondary | ICD-10-CM

## 2016-03-01 ENCOUNTER — Ambulatory Visit (INDEPENDENT_AMBULATORY_CARE_PROVIDER_SITE_OTHER): Payer: Medicaid Other | Admitting: Family Medicine

## 2016-03-01 ENCOUNTER — Encounter: Payer: Self-pay | Admitting: Family Medicine

## 2016-03-01 VITALS — BP 127/78 | HR 77 | Temp 98.2°F | Resp 18 | Ht 64.0 in | Wt 245.0 lb

## 2016-03-01 DIAGNOSIS — E559 Vitamin D deficiency, unspecified: Secondary | ICD-10-CM

## 2016-03-01 DIAGNOSIS — I10 Essential (primary) hypertension: Secondary | ICD-10-CM

## 2016-03-01 DIAGNOSIS — M25579 Pain in unspecified ankle and joints of unspecified foot: Secondary | ICD-10-CM

## 2016-03-01 DIAGNOSIS — R350 Frequency of micturition: Secondary | ICD-10-CM | POA: Diagnosis not present

## 2016-03-01 DIAGNOSIS — Z23 Encounter for immunization: Secondary | ICD-10-CM

## 2016-03-01 DIAGNOSIS — Z1211 Encounter for screening for malignant neoplasm of colon: Secondary | ICD-10-CM

## 2016-03-01 LAB — CBC WITH DIFFERENTIAL/PLATELET
Basophils Absolute: 0 cells/uL (ref 0–200)
Basophils Relative: 0 %
Eosinophils Absolute: 378 cells/uL (ref 15–500)
Eosinophils Relative: 7 %
HCT: 37.7 % (ref 35.0–45.0)
Hemoglobin: 12.4 g/dL (ref 11.7–15.5)
Lymphocytes Relative: 50 %
Lymphs Abs: 2700 cells/uL (ref 850–3900)
MCH: 30.7 pg (ref 27.0–33.0)
MCHC: 32.9 g/dL (ref 32.0–36.0)
MCV: 93.3 fL (ref 80.0–100.0)
MPV: 9.5 fL (ref 7.5–12.5)
Monocytes Absolute: 540 cells/uL (ref 200–950)
Monocytes Relative: 10 %
Neutro Abs: 1782 cells/uL (ref 1500–7800)
Neutrophils Relative %: 33 %
Platelets: 259 10*3/uL (ref 140–400)
RBC: 4.04 MIL/uL (ref 3.80–5.10)
RDW: 14.5 % (ref 11.0–15.0)
WBC: 5.4 10*3/uL (ref 3.8–10.8)

## 2016-03-01 LAB — POCT URINALYSIS DIP (DEVICE)
Bilirubin Urine: NEGATIVE
Glucose, UA: NEGATIVE mg/dL
Hgb urine dipstick: NEGATIVE
Ketones, ur: NEGATIVE mg/dL
Leukocytes, UA: NEGATIVE
Nitrite: NEGATIVE
Protein, ur: NEGATIVE mg/dL
Specific Gravity, Urine: 1.02 (ref 1.005–1.030)
Urobilinogen, UA: 0.2 mg/dL (ref 0.0–1.0)
pH: 6 (ref 5.0–8.0)

## 2016-03-01 MED ORDER — FUROSEMIDE 40 MG PO TABS: 40.0000 mg | ORAL_TABLET | Freq: Every day | ORAL | 2 refills | Status: DC

## 2016-03-01 MED ORDER — VITAMIN D (ERGOCALCIFEROL) 1.25 MG (50000 UNIT) PO CAPS: 50000.0000 [IU] | ORAL_CAPSULE | ORAL | 2 refills | Status: DC

## 2016-03-01 MED ORDER — AMLODIPINE BESYLATE 5 MG PO TABS
5.0000 mg | ORAL_TABLET | Freq: Every day | ORAL | 1 refills | Status: DC
Start: 2016-03-01 — End: 2016-07-12

## 2016-03-01 MED ORDER — CARVEDILOL 3.125 MG PO TABS: ORAL_TABLET | ORAL | 1 refills | Status: DC
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
## 2016-03-01 NOTE — Patient Instructions (Signed)
.   Continue current medications Will put in referral to podiatrist and for colonoscopy You can check with medicaid about who will take your insurance and make an appt when needed.

## 2016-03-02 ENCOUNTER — Encounter: Payer: Self-pay | Admitting: Gastroenterology

## 2016-03-02 LAB — COMPLETE METABOLIC PANEL WITH GFR
ALT: 15 U/L (ref 6–29)
AST: 16 U/L (ref 10–35)
Albumin: 3.6 g/dL (ref 3.6–5.1)
Alkaline Phosphatase: 57 U/L (ref 33–130)
BUN: 13 mg/dL (ref 7–25)
CO2: 31 mmol/L (ref 20–31)
Calcium: 9.3 mg/dL (ref 8.6–10.4)
Chloride: 103 mmol/L (ref 98–110)
Creat: 0.84 mg/dL (ref 0.50–1.05)
GFR, Est African American: >89 mL/min (ref >=60)
GFR, Est Non African American: 79 mL/min (ref >=60)
Glucose, Bld: 79 mg/dL (ref 65–99)
Potassium: 4.3 mmol/L (ref 3.5–5.3)
Sodium: 143 mmol/L (ref 135–146)
Total Bilirubin: 0.3 mg/dL (ref 0.2–1.2)
Total Protein: 7.3 g/dL (ref 6.1–8.1)

## 2016-03-03 LAB — VITAMIN D 1,25 DIHYDROXY
Vitamin D 1, 25 (OH)2 Total: 32 pg/mL (ref 18–72)
Vitamin D2 1, 25 (OH)2: <8 pg/mL
Vitamin D3 1, 25 (OH)2: 32 pg/mL

## 2016-03-22 ENCOUNTER — Ambulatory Visit (INDEPENDENT_AMBULATORY_CARE_PROVIDER_SITE_OTHER): Payer: Medicaid Other

## 2016-03-22 ENCOUNTER — Ambulatory Visit (INDEPENDENT_AMBULATORY_CARE_PROVIDER_SITE_OTHER): Payer: Medicaid Other | Admitting: Sports Medicine

## 2016-03-22 ENCOUNTER — Telehealth: Payer: Self-pay | Admitting: Sports Medicine

## 2016-03-22 ENCOUNTER — Encounter: Payer: Self-pay | Admitting: Sports Medicine

## 2016-03-22 DIAGNOSIS — M2142 Flat foot [pes planus] (acquired), left foot: Secondary | ICD-10-CM | POA: Diagnosis not present

## 2016-03-22 DIAGNOSIS — M722 Plantar fascial fibromatosis: Secondary | ICD-10-CM

## 2016-03-22 DIAGNOSIS — M79673 Pain in unspecified foot: Secondary | ICD-10-CM

## 2016-03-22 DIAGNOSIS — M2141 Flat foot [pes planus] (acquired), right foot: Secondary | ICD-10-CM

## 2016-03-22 NOTE — Telephone Encounter (Signed)
I called pt and gave her all information reqarding power steps and she said ok and we did have her size in stock and pt is aware

## 2016-03-22 NOTE — Telephone Encounter (Signed)
Call patient back and let her know she can buy powersteps inserts from [place] but they are not custom. Custom inserts will cost and are not covered by Medicaid. Thanks Dr Cannon Kettle

## 2016-03-22 NOTE — Patient Instructions (Signed)

## 2016-03-22 NOTE — Telephone Encounter (Signed)
Pt called and she went to [facility] with the rx for the orthotics and they are 300 dolllars. Do we have anything she can buy at our office that is better than you can buy at [facility] that are not as expensive.

## 2016-03-22 NOTE — Progress Notes (Signed)
Subjective: Stacie Daugherty is a 55 y.o. female patient presents to office with complaint of heel pain on the left and right. Patient admits to post static dyskinesia for 5-6 months in duration with the last flareup 2 years ago treated by Dr. at [facility] x-rays injections. Patient has treated this problem with nothing. Reports that now she has insurance and wants to come and get her feet cared for. Also, states occasional pain in the ball of feet when her heels are also bothering her. Denies any other pedal complaints.   Patient Active Problem List   Diagnosis Date Noted  . Bipolar disorder, curr episode mixed, severe, with psychotic features ([place]) 08/26/2015  . History of posttraumatic stress disorder (PTSD) 08/26/2015  . Essential hypertension 08/26/2015  . History of eczema 04/08/2015  . Pedal edema 08/06/2014  . Positive ANA (antinuclear antibody) 12/19/2012  . Biological false positive RPR test 08/18/2012  . HYPERCHOLESTEROLEMIA 04/28/2009  . HIDRADENITIS SUPPURATIVA 09/06/2007  . OBESITY 04/17/2007    Current Outpatient Prescriptions on File Prior to Visit  Medication Sig Dispense Refill  . amLODipine (NORVASC) 5 MG tablet Take 1 tablet (5 mg total) by mouth daily. for high blood pressure 90 tablet 1  . ARIPiprazole 400 MG SUSR Inject 400 mg into the muscle every 28 (twenty-eight) days. (Due to be given on 10-05-15): For mood control 1 each 0  . carbamazepine (TEGRETOL) 200 MG tablet Take 1 tablet (200 mg total) by mouth 2 (two) times daily at 8 am and 10 pm. 60 tablet 0  . carvedilol (COREG) 3.125 MG tablet TAKE 1 TABLET BY MOUTH DAILY WITH SUPPER FOR HIGH BLOOD PRESSURE 90 tablet 1  . Cholecalciferol (VITAMIN D PO) Take 1 capsule by mouth daily.    . divalproex (DEPAKOTE) 250 MG DR tablet Take 1 tablet (250 mg) in AM & 500 mg at PM & 500 mg at bedtime: For mood stabilization 150 tablet 0  . furosemide (LASIX) 40 MG tablet Take 1 tablet (40 mg total) by mouth daily. For  swelling 30 tablet 2  . gabapentin (NEURONTIN) 300 MG capsule Take 1 capsule (300 mg total) by mouth 3 (three) times daily at 8am, 2pm and bedtime. For agitation 90 capsule 0  . MELATONIN PO Take 1 tablet by mouth at bedtime.    Marland Kitchen OLANZapine zydis (ZYPREXA) 10 MG disintegrating tablet Take 1 tablet (10 mg total) by mouth at bedtime. 30 tablet 0  . oxybutynin (DITROPAN-XL) 10 MG 24 hr tablet Take 1 tablet (10 mg total) by mouth at bedtime. (Patient not taking: Reported on 03/01/2016) 30 tablet 2  . temazepam (RESTORIL) 30 MG capsule Take 1 capsule (30 mg total) by mouth at bedtime. For insomnia (Patient not taking: Reported on 03/01/2016) 30 capsule 0  . triamcinolone cream (KENALOG) 0.1 % Apply 1 application topically 2 (two) times daily. For skin rash (Patient not taking: Reported on 03/01/2016) 1 g 0  . Vitamin D, Ergocalciferol, (DRISDOL) 50000 units CAPS capsule Take 1 capsule (50,000 Units total) by mouth every 7 (seven) days. 4 capsule 2   No current facility-administered medications on file prior to visit.     Allergies  Allergen Reactions  . Haldol [Haloperidol Lactate] Other (See Comments)    Pt  Just doesn't like because she cant remember anything the next day.     Objective: Physical Exam General: The patient is alert and oriented x3 in no acute distress.  Dermatology: Skin is warm, dry and supple bilateral lower extremities. Nails 1-10  are normal. There is no erythema, edema, no eccymosis, no open lesions present. Integument is otherwise unremarkable.  Vascular: Dorsalis Pedis pulse and Posterior Tibial pulse are 2/4 bilateral. Capillary fill time is immediate to all digits.  Neurological: Grossly intact to light touch with an achilles reflex of +2/5 and a  negative Tinel's sign bilateral.  Musculoskeletal: No reproducible tenderness to balls of both feet. Tenderness to palpation at the medial calcaneal tubercale and through the insertion of the plantar fascia on the left and  right foot. No pain with compression of calcaneus bilateral. No pain with tuning fork to calcaneus bilateral. No pain with calf compression bilateral. There is decreased Ankle joint range of motion bilateral. All other joints range of motion within normal limits bilateral. Pes planus foot type bilateral. Strength 5/5 in all groups bilateral.   Xray, Right and Left foot:  Normal osseous mineralization. Joint spaces preserved except at midfoot, where there is midtarsal breach, supportive of planus. No fracture/dislocation/boney destruction. Calcaneal spur present with mild thickening of plantar fascia. No other soft tissue abnormalities or radiopaque foreign bodies.   Assessment and Plan: Problem List Items Addressed This Visit    None    Visit Diagnoses    Pain of foot, unspecified laterality    -  Primary   Relevant Orders   DG Foot 2 Views Left   DG Foot 2 Views Right   Plantar fasciitis       Pes planus of both feet          -Complete examination performed.  -Xrays reviewed -Discussed with patient in detail the condition of plantar fasciitis, how this occurs and general treatment options. Explained both conservative and surgical treatments.  -After oral consent and aseptic prep, injected a mixture containing 1 ml of 2%  plain lidocaine, 1 ml 0.5% plain marcaine, 0.5 ml of kenalog 10 and 0.5 ml of dexamethasone phosphate into left and right heel. Post-injection care discussed with patient.  -Continue with current anti-inflammatory -Recommended good supportive shoes and advised use of OTC insert. Prescription given to [facility] for custom molded orthoses. -Explained and dispensed to patient daily stretching exercises. -Recommend patient to ice affected area 1-2x daily. -Patient to return to office in 4 weeks for follow up or sooner if problems or questions arise.  Landis Martins, DPM

## 2016-04-19 ENCOUNTER — Ambulatory Visit: Payer: Medicaid Other | Admitting: Sports Medicine

## 2016-04-20 ENCOUNTER — Ambulatory Visit (AMBULATORY_SURGERY_CENTER): Payer: Self-pay | Admitting: *Deleted

## 2016-04-20 VITALS — Ht 63.0 in | Wt 249.0 lb

## 2016-04-20 DIAGNOSIS — Z1211 Encounter for screening for malignant neoplasm of colon: Secondary | ICD-10-CM

## 2016-04-20 MED ORDER — NA SULFATE-K SULFATE-MG SULF 17.5-3.13-1.6 GM/177ML PO SOLN: 1.0000 | Freq: Once | ORAL | 0 refills | Status: AC

## 2016-04-20 NOTE — Progress Notes (Signed)
No egg or soy allergy known to patient  No issues with past sedation with any surgeries  or procedures, no intubation problems  No diet pills per patient No home 02 use per patient  No blood thinners per patient  Pt denies issues with constipation  No A fib or A flutter    emmi declined-- no e mail

## 2016-04-20 NOTE — Telephone Encounter (Signed)
ERROR

## 2016-05-03 ENCOUNTER — Ambulatory Visit: Payer: Medicaid Other | Admitting: Sports Medicine

## 2016-05-04 ENCOUNTER — Encounter: Payer: Self-pay | Admitting: Gastroenterology

## 2016-05-04 ENCOUNTER — Ambulatory Visit (AMBULATORY_SURGERY_CENTER): Payer: Medicaid Other | Admitting: Gastroenterology

## 2016-05-04 VITALS — BP 132/80 | HR 68 | Temp 98.0°F | Resp 18 | Ht 63.0 in | Wt 249.0 lb

## 2016-05-04 DIAGNOSIS — D123 Benign neoplasm of transverse colon: Secondary | ICD-10-CM | POA: Diagnosis not present

## 2016-05-04 DIAGNOSIS — D125 Benign neoplasm of sigmoid colon: Secondary | ICD-10-CM

## 2016-05-04 DIAGNOSIS — Z1212 Encounter for screening for malignant neoplasm of rectum: Secondary | ICD-10-CM | POA: Diagnosis not present

## 2016-05-04 DIAGNOSIS — D122 Benign neoplasm of ascending colon: Secondary | ICD-10-CM

## 2016-05-04 DIAGNOSIS — D127 Benign neoplasm of rectosigmoid junction: Secondary | ICD-10-CM

## 2016-05-04 DIAGNOSIS — D120 Benign neoplasm of cecum: Secondary | ICD-10-CM | POA: Diagnosis not present

## 2016-05-04 DIAGNOSIS — Z1211 Encounter for screening for malignant neoplasm of colon: Secondary | ICD-10-CM

## 2016-05-04 DIAGNOSIS — K635 Polyp of colon: Secondary | ICD-10-CM

## 2016-05-04 MED ORDER — SODIUM CHLORIDE 0.9 % IV SOLN: 500.0000 mL | INTRAVENOUS | Status: DC

## 2016-05-04 MED ORDER — NONFORMULARY OR COMPOUNDED ITEM: 0 refills | Status: DC

## 2016-05-04 NOTE — Op Note (Signed)
Stacie Daugherty Name: Stacie Daugherty Procedure Date: 05/04/2016 11:31 AM MRN: [id number] Endoscopist: Remo Lipps P. Armbruster MD, MD Age: 55 Referring MD:  Date of Birth: June 18, 1960 Gender: Female Account #: [id number] Procedure:                Colonoscopy Indications:              Screening for malignant neoplasm in the colon, This                            is the Daugherty's first colonoscopy Medicines:                Monitored Anesthesia Care Procedure:                Pre-Anesthesia Assessment:                           - Prior to the procedure, a History and Physical                            was performed, and Daugherty medications and                            allergies were reviewed. The Daugherty's tolerance of                            previous anesthesia was also reviewed. The risks                            and benefits of the procedure and the sedation                            options and risks were discussed with the Daugherty.                            All questions were answered, and informed consent                            was obtained. Prior Anticoagulants: The Daugherty has                            taken no previous anticoagulant or antiplatelet                            agents. ASA Grade Assessment: II - A Daugherty with                            mild systemic disease. After reviewing the risks                            and benefits, the Daugherty was deemed in                            satisfactory condition to undergo the procedure.  After obtaining informed consent, the colonoscope                            was passed under direct vision. Throughout the                            procedure, the Daugherty's blood pressure, pulse, and                            oxygen saturations were monitored continuously. The                            Model PCF-H190L [id number]) scope was introduced                            through the  anus and advanced to the the cecum,                            identified by appendiceal orifice and ileocecal                            valve. The colonoscopy was performed without                            difficulty. The Daugherty tolerated the procedure                            well. The quality of the bowel preparation was                            good. The ileocecal valve, appendiceal orifice, and                            rectum were photographed. Scope In: 11:35:04 AM Scope Out: [id number] AM Scope Withdrawal Time: 0 hours 19 minutes 6 seconds  Total Procedure Duration: 0 hours 21 minutes 52 seconds  Findings:                 The perianal exam findings include anal fissure.                           A 4 mm polyp was found in the cecum. The polyp was                            sessile. The polyp was removed with a cold snare.                            Resection and retrieval were complete.                           A 3 mm polyp was found in the ileocecal valve. The                            polyp was sessile.  The polyp was removed with a                            cold biopsy forceps. Resection and retrieval were                            complete.                           A 5 mm polyp was found in the ascending colon. The                            polyp was sessile. The polyp was removed with a                            cold snare. Resection and retrieval were complete.                           A 5 mm polyp was found in the hepatic flexure. The                            polyp was sessile. The polyp was removed with a                            cold snare. Resection and retrieval were complete.                           A 6 mm polyp was found in the sigmoid colon. The                            polyp was sessile. The polyp was removed with a                            cold snare. Resection and retrieval were complete.                           A 8 mm polyp was found in  the recto-sigmoid colon.                            The polyp was semi-pedunculated. The polyp was                            removed with a hot snare. Resection and retrieval                            were complete.                           Multiple small and large-mouthed diverticula were                            found in the entire colon, one of which in the  sigmoid colon appeared inflamed.                           Internal hemorrhoids were found during                            retroflexion. The hemorrhoids were small.                           The exam was otherwise without abnormality. Complications:            No immediate complications. Estimated blood loss:                            Minimal. Estimated Blood Loss:     Estimated blood loss was minimal. Impression:               - Anal fissure found on perianal exam.                           - One 4 mm polyp in the cecum, removed with a cold                            snare. Resected and retrieved.                           - One 3 mm polyp at the ileocecal valve, removed                            with a cold biopsy forceps. Resected and retrieved.                           - One 5 mm polyp in the ascending colon, removed                            with a cold snare. Resected and retrieved.                           - One 5 mm polyp at the hepatic flexure, removed                            with a cold snare. Resected and retrieved.                           - One 6 mm polyp in the sigmoid colon, removed with                            a cold snare. Resected and retrieved.                           - One 8 mm polyp at the recto-sigmoid colon,                            removed with a hot snare. Resected and retrieved.                           -  Diverticulosis in the entire examined colon.                           - Internal hemorrhoids.                           - The examination was otherwise  normal. Recommendation:           - Daugherty has a contact number available for                            emergencies. The signs and symptoms of potential                            delayed complications were discussed with the                            Daugherty. Return to normal activities tomorrow.                            Written discharge instructions were provided to the                            Daugherty.                           - Resume previous diet.                           - Continue present medications.                           - Topical nitroglycerin 0.125% ointment - apply pea                            sized amount into anal canal three times daily as                            needed for anal fissure therapy                           - No ibuprofen, naproxen, or other non-steroidal                            anti-inflammatory drugs for 2 weeks after polyp                            removal.                           - Await pathology results.                           - Repeat colonoscopy is recommended for                            surveillance. The colonoscopy date will  be                            determined after pathology results from today's                            exam become available for review. Remo Lipps P. Armbruster MD, MD 05/04/2016 12:04:10 PM This report has been signed electronically.

## 2016-05-04 NOTE — Progress Notes (Signed)
Called to room to assist during endoscopic procedure.  Patient ID and intended procedure confirmed with present staff. Received instructions for my participation in the procedure from the performing physician.  

## 2016-05-04 NOTE — Progress Notes (Signed)
A and O x3. Report to RN. Tolerated MAC anesthesia well. 

## 2016-05-04 NOTE — Patient Instructions (Addendum)
YOU HAD AN ENDOSCOPIC PROCEDURE TODAY AT [facility]:   Refer to the procedure report that was given to you for any specific questions about what was found during the examination.  If the procedure report does not answer your questions, please call your gastroenterologist to clarify.  If you requested that your care partner not be given the details of your procedure findings, then the procedure report has been included in a sealed envelope for you to review at your convenience later.  YOU SHOULD EXPECT: Some feelings of bloating in the abdomen. Passage of more gas than usual.  Walking can help get rid of the air that was put into your GI tract during the procedure and reduce the bloating. If you had a lower endoscopy (such as a colonoscopy or flexible sigmoidoscopy) you may notice spotting of blood in your stool or on the toilet paper. If you underwent a bowel prep for your procedure, you may not have a normal bowel movement for a few days.  Please Note:  You might notice some irritation and congestion in your nose or some drainage.  This is from the oxygen used during your procedure.  There is no need for concern and it should clear up in a day or so.  SYMPTOMS TO REPORT IMMEDIATELY:   Following lower endoscopy (colonoscopy or flexible sigmoidoscopy):  Excessive amounts of blood in the stool  Significant tenderness or worsening of abdominal pains  Swelling of the abdomen that is new, acute  Fever of 100F or higher   For urgent or emergent issues, a gastroenterologist can be reached at any hour by calling [phone].   DIET:  We do recommend a small meal at first, but then you may proceed to your regular diet.  Drink plenty of fluids but you should avoid alcoholic beverages for 24 hours.  ACTIVITY:  You should plan to take it easy for the rest of today and you should NOT DRIVE or use heavy machinery until tomorrow (because of the sedation medicines used during the test).     FOLLOW UP: Our staff will call the number listed on your records the next business day following your procedure to check on you and address any questions or concerns that you may have regarding the information given to you following your procedure. If we do not reach you, we will leave a message.  However, if you are feeling well and you are not experiencing any problems, there is no need to return our call.  We will assume that you have returned to your regular daily activities without incident.  If any biopsies were taken you will be contacted by phone or by letter within the next 1-3 weeks.  Please call us at [phone] if you have not heard about the biopsies in 3 weeks.    SIGNATURES/CONFIDENTIALITY: You and/or your care partner have signed paperwork which will be entered into your electronic medical record.  These signatures attest to the fact that that the information above on your After Visit Summary has been reviewed and is understood.  Full responsibility of the confidentiality of this discharge information lies with you and/or your care-partner.  AVOID ASPIRIN, ASPIRIN PRODUCTS, NSAIDS (MOTRIN, ALEVE, IBUPROFEN, ADVIL, NAPROSYN ETC) FOR TWO WEEKS UNTIL 05/18/16.  Nitroglycerin 0.125% gel. Apply a pea size amount to your rectum three times daily as needed. Please take your printed prescription to [facility]'s information is below:  Address: [address],  [place], [place] [address]  [phone]

## 2016-05-05 ENCOUNTER — Telehealth: Payer: Self-pay | Admitting: *Deleted

## 2016-05-05 NOTE — Telephone Encounter (Signed)
No answer, message left for the patient. 

## 2016-05-06 ENCOUNTER — Telehealth: Payer: Self-pay

## 2016-05-06 NOTE — Telephone Encounter (Signed)
  Follow up Call-  Call back number 05/04/2016  Post procedure Call Back phone  # [phone]  Permission to leave phone message Yes  Some recent data might be hidden     Patient questions:  Do you have a fever, pain , or abdominal swelling? No. Pain Score  0 *  Have you tolerated food without any problems? Yes.    Have you been able to return to your normal activities? Yes.    Do you have any questions about your discharge instructions: Diet   No. Medications  No. Follow up visit  No.  Do you have questions or concerns about your Care? No.  Actions: * If pain score is 4 or above: No action needed, pain <4.

## 2016-05-11 ENCOUNTER — Encounter: Payer: Self-pay | Admitting: Gastroenterology

## 2016-06-02 ENCOUNTER — Encounter: Payer: Self-pay | Admitting: Family Medicine

## 2016-06-02 ENCOUNTER — Ambulatory Visit (INDEPENDENT_AMBULATORY_CARE_PROVIDER_SITE_OTHER): Payer: Medicaid Other | Admitting: Family Medicine

## 2016-06-02 ENCOUNTER — Ambulatory Visit (HOSPITAL_COMMUNITY)
Admission: RE | Admit: 2016-06-02 | Discharge: 2016-06-02 | Disposition: A | Discharge status: Home / Self Care | Payer: Medicaid Other | Source: Ambulatory Visit | Attending: Family Medicine | Admitting: Family Medicine

## 2016-06-02 ENCOUNTER — Other Ambulatory Visit: Payer: Self-pay | Admitting: Family Medicine

## 2016-06-02 VITALS — BP 140/76 | HR 71 | Temp 98.0°F | Resp 14 | Ht 63.0 in | Wt 252.0 lb

## 2016-06-02 DIAGNOSIS — S83102A Unspecified subluxation of left knee, initial encounter: Secondary | ICD-10-CM

## 2016-06-02 DIAGNOSIS — M1712 Unilateral primary osteoarthritis, left knee: Secondary | ICD-10-CM | POA: Diagnosis not present

## 2016-06-02 DIAGNOSIS — M25462 Effusion, left knee: Secondary | ICD-10-CM | POA: Diagnosis not present

## 2016-06-02 DIAGNOSIS — X58XXXA Exposure to other specified factors, initial encounter: Secondary | ICD-10-CM | POA: Insufficient documentation

## 2016-06-02 DIAGNOSIS — S83192A Other subluxation of left knee, initial encounter: Secondary | ICD-10-CM | POA: Diagnosis not present

## 2016-06-02 DIAGNOSIS — G8929 Other chronic pain: Secondary | ICD-10-CM | POA: Insufficient documentation

## 2016-06-02 DIAGNOSIS — M25562 Pain in left knee: Secondary | ICD-10-CM | POA: Diagnosis present

## 2016-06-02 MED ORDER — ACETAMINOPHEN-CODEINE #3 300-30 MG PO TABS: 1.0000 | ORAL_TABLET | Freq: Four times a day (QID) | ORAL | 0 refills | Status: DC | PRN

## 2016-06-02 NOTE — Progress Notes (Signed)
I have left message.Marland KitchenMarland KitchenFYI

## 2016-06-02 NOTE — Patient Instructions (Addendum)
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
## 2016-06-02 NOTE — Progress Notes (Signed)
LM to CB

## 2016-06-07 ENCOUNTER — Encounter (INDEPENDENT_AMBULATORY_CARE_PROVIDER_SITE_OTHER): Payer: Self-pay

## 2016-06-07 ENCOUNTER — Encounter (INDEPENDENT_AMBULATORY_CARE_PROVIDER_SITE_OTHER): Payer: Self-pay | Admitting: Orthopaedic Surgery

## 2016-06-07 ENCOUNTER — Ambulatory Visit (INDEPENDENT_AMBULATORY_CARE_PROVIDER_SITE_OTHER): Payer: Medicaid Other | Admitting: Orthopaedic Surgery

## 2016-06-07 DIAGNOSIS — S83412A Sprain of medial collateral ligament of left knee, initial encounter: Secondary | ICD-10-CM | POA: Insufficient documentation

## 2016-06-07 MED ORDER — NAPROXEN 500 MG PO TABS: 500.0000 mg | ORAL_TABLET | Freq: Two times a day (BID) | ORAL | 3 refills | Status: DC

## 2016-06-07 NOTE — Progress Notes (Signed)
Office Visit Note   Patient: Stacie Daugherty           Date of Birth: 1960-12-05           MRN: [id number] Visit Date: 06/07/2016              Requested by: [name], FNP [address], [place] [address] PCP: [name], FNP   Assessment & Plan: Visit Diagnoses:  1. Sprain of medial collateral ligament of left knee, initial encounter     Plan: Impression is left knee MCL mild sprain. Recommend scheduled Naprosyn for 2 weeks then as needed. Anticipate full recovery in about 6 weeks. Follow-up as needed.  Follow-Up Instructions: Return if symptoms worsen or fail to improve.   Orders:  No orders of the defined types were placed in this encounter.  Meds ordered this encounter  Medications  . naproxen (NAPROSYN) 500 MG tablet    Sig: Take 1 tablet (500 mg total) by mouth 2 (two) times daily with a meal.    Dispense:  30 tablet    Refill:  3      Procedures: No procedures performed   Clinical Data: No additional findings.   Subjective: Chief Complaint  Patient presents with  . Left Knee - Pain    Patient is a 56 year old female who comes in with 4-5 day history of left knee pain after she felt a pop on the medial side of her knee while walking. Her pain is 6 out of 10. She does not have any significant mechanical symptoms. She may ambulate with a cane. Denies any swelling. Denies any radiation of pain. She is not taking any medicines. Denies any constitutional symptoms    Review of Systems Complete review of systems negative except for history of present illness  Objective: Vital Signs: There were no vitals taken for this visit.  Physical Exam Well-developed nourished acute distress alert 3 nonlabored breathing normal judgments affect abdomen soft no lymphadenopathy Ortho Exam Exam of the left knee shows no joint effusion. Range of motion is normal. No crepitus. Collaterals and cruciates are stable. Negative McMurray's. She mainly  has pain with valgus stress of the knee. There is a firm endpoint with MCL testing. Specialty Comments:  No specialty comments available.  Imaging: No results found.   PMFS History: Patient Active Problem List   Diagnosis Date Noted  . Sprain of medial collateral ligament of left knee 06/07/2016  . Acute pain of left knee 06/02/2016  . Bipolar disorder, curr episode mixed, severe, with psychotic features ([place]) 08/26/2015  . History of posttraumatic stress disorder (PTSD) 08/26/2015  . Essential hypertension 08/26/2015  . History of eczema 04/08/2015  . Pedal edema 08/06/2014  . Positive ANA (antinuclear antibody) 12/19/2012  . Biological false positive RPR test 08/18/2012  . HYPERCHOLESTEROLEMIA 04/28/2009  . HIDRADENITIS SUPPURATIVA 09/06/2007  . OBESITY 04/17/2007   Past Medical History:  Diagnosis Date  . Allergy    mild  . Anxiety    bipolar   . Arthritis    both knees  . Bipolar 1 disorder ([place])    Hospitalized multiple times for bipolar disorder ([facility] - [facility], [facility], and [facility], most recently in [place])  . Depression    bipolar  . Hidradenitis suppurativa   . Hypertension     Family History  Problem Relation Age of Onset  . Depression Mother   . Pulmonary embolism Mother   . Bipolar disorder Mother   .  Cancer Father     ?prostate cancer  . Hypertension Maternal Grandfather     had colostomy bag- unsure reason   . Hypertension Paternal Grandmother   . Colon cancer Neg Hx   . Colon polyps Neg Hx   . Stomach cancer Neg Hx   . Rectal cancer Neg Hx   . Breast cancer Neg Hx   . Esophageal cancer Neg Hx     Past Surgical History:  Procedure Laterality Date  . INDUCED ABORTION     in patient's 67s in [place], [place].  . WISDOM TOOTH EXTRACTION     Social History   Occupational History  . Not on file.   Social History Main Topics  . Smoking status: Former Smoker    Types: Cigarettes  . Smokeless tobacco: Never  Used     Comment: Smoked few cigarettes, socially, "did not inhale"  . Alcohol use No     Comment: Social alcohol use when younger  . Drug use: No     Comment: Social marijuana use when younger  . Sexual activity: Not on file

## 2016-06-20 ENCOUNTER — Other Ambulatory Visit: Payer: Self-pay | Admitting: Family Medicine

## 2016-06-20 DIAGNOSIS — Z1231 Encounter for screening mammogram for malignant neoplasm of breast: Secondary | ICD-10-CM

## 2016-06-29 ENCOUNTER — Telehealth (INDEPENDENT_AMBULATORY_CARE_PROVIDER_SITE_OTHER): Payer: Self-pay | Admitting: Orthopaedic Surgery

## 2016-06-29 NOTE — Telephone Encounter (Signed)
See message below  Please advise

## 2016-06-29 NOTE — Telephone Encounter (Signed)
Pt asked if she needs to come back to see Dr. Erlinda Hong or a rheumatologist. She doesn't know what to do about her knee. She was seen not long ago and said there was not much Dr. Erlinda Hong could do for her.   [phone]

## 2016-06-29 NOTE — Telephone Encounter (Signed)
Called pt and read to her the plan on her last office visit. She states she has a scheduled appt.

## 2016-06-29 NOTE — Telephone Encounter (Signed)
Rheumatologist for what?  I saw her for MCL sprain

## 2016-07-05 ENCOUNTER — Ambulatory Visit (INDEPENDENT_AMBULATORY_CARE_PROVIDER_SITE_OTHER): Payer: Medicaid Other | Admitting: Orthopaedic Surgery

## 2016-07-12 ENCOUNTER — Ambulatory Visit (INDEPENDENT_AMBULATORY_CARE_PROVIDER_SITE_OTHER): Payer: Medicaid Other | Admitting: Family Medicine

## 2016-07-12 ENCOUNTER — Encounter: Payer: Self-pay | Admitting: Family Medicine

## 2016-07-12 VITALS — BP 150/82 | HR 78 | Temp 98.0°F | Resp 16 | Ht 63.0 in | Wt 254.0 lb

## 2016-07-12 DIAGNOSIS — E7800 Pure hypercholesterolemia, unspecified: Secondary | ICD-10-CM | POA: Diagnosis not present

## 2016-07-12 DIAGNOSIS — I10 Essential (primary) hypertension: Secondary | ICD-10-CM | POA: Diagnosis not present

## 2016-07-12 DIAGNOSIS — R635 Abnormal weight gain: Secondary | ICD-10-CM

## 2016-07-12 DIAGNOSIS — E8881 Metabolic syndrome: Secondary | ICD-10-CM | POA: Diagnosis not present

## 2016-07-12 LAB — COMPLETE METABOLIC PANEL WITH GFR
ALT: 13 U/L (ref 6–29)
AST: 15 U/L (ref 10–35)
Albumin: 4.2 g/dL (ref 3.6–5.1)
Alkaline Phosphatase: 58 U/L (ref 33–130)
BUN: 12 mg/dL (ref 7–25)
CO2: 29 mmol/L (ref 20–31)
Calcium: 9.5 mg/dL (ref 8.6–10.4)
Chloride: 104 mmol/L (ref 98–110)
Creat: 0.77 mg/dL (ref 0.50–1.05)
GFR, Est African American: >89 mL/min (ref >=60)
GFR, Est Non African American: 88 mL/min (ref >=60)
Glucose, Bld: 92 mg/dL (ref 65–99)
Potassium: 4 mmol/L (ref 3.5–5.3)
Sodium: 142 mmol/L (ref 135–146)
Total Bilirubin: 0.3 mg/dL (ref 0.2–1.2)
Total Protein: 7.8 g/dL (ref 6.1–8.1)

## 2016-07-12 LAB — POCT URINALYSIS DIP (DEVICE)
Bilirubin Urine: NEGATIVE
Glucose, UA: NEGATIVE mg/dL
Ketones, ur: NEGATIVE mg/dL
Leukocytes, UA: NEGATIVE
Nitrite: NEGATIVE
Protein, ur: NEGATIVE mg/dL
Specific Gravity, Urine: 1.015 (ref 1.005–1.030)
Urobilinogen, UA: 0.2 mg/dL (ref 0.0–1.0)
pH: 6.5 (ref 5.0–8.0)

## 2016-07-12 LAB — LIPID PANEL
Cholesterol: 223 mg/dL — ABNORMAL HIGH (ref <200)
HDL: 81 mg/dL (ref >50)
LDL Cholesterol: 116 mg/dL — ABNORMAL HIGH (ref <100)
Total CHOL/HDL Ratio: 2.8 Ratio (ref <5.0)
Triglycerides: 130 mg/dL (ref <150)
VLDL: 26 mg/dL (ref <30)

## 2016-07-12 LAB — POCT GLYCOSYLATED HEMOGLOBIN (HGB A1C): Hemoglobin A1C: 5.3

## 2016-07-12 MED ORDER — AMLODIPINE BESYLATE 10 MG PO TABS: 5.0000 mg | ORAL_TABLET | Freq: Every day | ORAL | 2 refills | Status: DC

## 2016-07-12 MED ORDER — AMLODIPINE BESYLATE 10 MG PO TABS: 10.0000 mg | ORAL_TABLET | Freq: Every day | ORAL | 2 refills | Status: DC

## 2016-07-12 NOTE — Patient Instructions (Addendum)
Diet for Metabolic Syndrome Metabolic syndrome is a disorder that includes at least three of these conditions:  Abdominal obesity.  Too much sugar in your blood.  High blood pressure.  Higher than normal amount of fat (lipids) in your blood.  Lower than normal level of "good" cholesterol (HDL). Following a healthy diet can help to keep metabolic syndrome under control. It can also help to prevent the development of conditions that are associated with metabolic syndrome, such as diabetes, heart disease, and stroke. Along with exercise, a healthy diet:  Helps to improve the way that the body uses insulin.  Promotes weight loss. A common goal for people with this condition is to lose at least 7 to 10 percent of their starting weight. What do I need to know about this diet?  Use the glycemic index (GI) to plan your meals. The index tells you how quickly a food will raise your blood sugar. Choose foods that have low GI values. These foods take a longer time to raise blood sugar.  Keep track of how many calories you take in. Eating the right amount of calories will help your achieve a healthy weight.  You may want to follow a Mediterranean diet. This diet includes lots of vegetables, lean meats or fish, whole grains, fruits, and healthy oils and fats. What foods can I eat? Grains  Stone-ground whole wheat. Pumpernickel bread. Whole-grain bread, crackers, tortillas, cereal, and pasta. Unsweetened oatmeal.Bulgur.Barley.Quinoa.Brown rice or wild rice. Vegetables  Lettuce. Spinach. Peas. Beets. Cauliflower. Cabbage. Broccoli. Carrots. Tomatoes. Squash. Eggplant. Herbs. Peppers. Onions. Cucumbers. Brussels sprouts. Sweet potatoes. Yams. Beans. Lentils. Fruits  Berries. Apples. Oranges. Grapes. Mango. Pomegranate. Kiwi. Cherries. Meats and Other Protein Sources  Seafood and shellfish. Lean meats.Poultry. Tofu. Dairy  Low-fat or fat-free dairy products, such as milk, yogurt, and  cheese. Beverages  Water. Low-fat milk. Milk alternatives, like soy milk or almond milk. Real fruit juice. Condiments  Low-sugar or sugar-free ketchup, barbecue sauce, and mayonnaise. Mustard. Relish. Fats and Oils  Avocado. Canola or olive oil. Nuts and nut butters.Seeds. The items listed above may not be a complete list of recommended foods or beverages. Contact your dietitian for more options.  What foods are not recommended? Red meat. Palm oil and coconut oil. Processed foods. Fried foods. Alcohol. Sweetened drinks, such as iced tea and soda. Sweets. Salty foods. The items listed above may not be a complete list of foods and beverages to avoid. Contact your dietitian for more information.  This information is not intended to replace advice given to you by your health care provider. Make sure you discuss any questions you have with your health care provider. Document Released: 09/30/2014 Document Revised: 09/25/2015 Document Reviewed: 05/28/2014 Elsevier Interactive Patient Education  2017 Elsevier Inc.  
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
## 2016-07-13 ENCOUNTER — Other Ambulatory Visit: Payer: Self-pay | Admitting: Family Medicine

## 2016-07-13 ENCOUNTER — Other Ambulatory Visit: Payer: Self-pay

## 2016-07-13 DIAGNOSIS — E7849 Other hyperlipidemia: Secondary | ICD-10-CM

## 2016-07-13 LAB — TSH: TSH: 1.1 mIU/L

## 2016-07-13 MED ORDER — ATORVASTATIN CALCIUM 20 MG PO TABS: 20.0000 mg | ORAL_TABLET | Freq: Every day | ORAL | 3 refills | Status: DC

## 2016-07-13 NOTE — Telephone Encounter (Signed)
Patient called back and asked to send rx into [facility]. Thanks!

## 2016-07-13 NOTE — Progress Notes (Signed)
Called and spoke with patient, advised of elevated cholesterol and the need to start Atorvastatin once every evening. Advised to start low fat/low cholesterol diet, drink 6 to 8 glasses of water daily, and exercise throughout the week. Asked that she keep her next office visit and we will follow up with her then. Thanks!

## 2016-07-13 NOTE — Progress Notes (Signed)
Reviewed labs. The cholesterol is elevated - see lab result. Repeat test in 6-12 months.Will start Atorvastatin 20 mg every evening after dinner. Recommend a lowfat, low carbohydrate diet divided over 5-6 small meals, increase water intake to 6-8 glasses, and 150 minutes per week of cardiovascular exercise.   Meds ordered this encounter  Medications  . atorvastatin (LIPITOR) 20 MG tablet    Sig: Take 1 tablet (20 mg total) by mouth daily.    Dispense:  90 tablet    Refill:  3    Hue Frick M, FNP

## 2016-07-19 ENCOUNTER — Encounter (INDEPENDENT_AMBULATORY_CARE_PROVIDER_SITE_OTHER): Payer: Self-pay | Admitting: Orthopaedic Surgery

## 2016-07-19 ENCOUNTER — Ambulatory Visit (INDEPENDENT_AMBULATORY_CARE_PROVIDER_SITE_OTHER): Payer: Medicaid Other | Admitting: Orthopaedic Surgery

## 2016-07-19 DIAGNOSIS — S83412A Sprain of medial collateral ligament of left knee, initial encounter: Secondary | ICD-10-CM | POA: Diagnosis not present

## 2016-07-19 MED ORDER — MELOXICAM 7.5 MG PO TABS: 7.5000 mg | ORAL_TABLET | Freq: Two times a day (BID) | ORAL | 2 refills | Status: DC | PRN

## 2016-07-19 NOTE — Progress Notes (Signed)
Office Visit Note   Patient: Stacie Daugherty           Date of Birth: 06-Jul-1960           MRN: [id number] Visit Date: 07/19/2016              Requested by: [name], FNP [address], [place] [address] PCP: [name], FNP   Assessment & Plan: Visit Diagnoses:  1. Sprain of medial collateral ligament of left knee, initial encounter     Plan: resolved MCL sprain.  She also has DJD.  mobic prescribed.  HEP given.  F/u prn.  Follow-Up Instructions: Return if symptoms worsen or fail to improve.   Orders:  No orders of the defined types were placed in this encounter.  Meds ordered this encounter  Medications  . meloxicam (MOBIC) 7.5 MG tablet    Sig: Take 1 tablet (7.5 mg total) by mouth 2 (two) times daily as needed for pain.    Dispense:  30 tablet    Refill:  2      Procedures: No procedures performed   Clinical Data: No additional findings.   Subjective: Chief Complaint  Patient presents with  . Left Knee - Pain, Follow-up    Patient f/u today for left knee pain.  She's doing much better.  Occasionally she'll have a quick sharp pain.  Naproxen gives partial relief.  Has tingling and cracking in knee when standing.      Review of Systems   Objective: Vital Signs: There were no vitals taken for this visit.  Physical Exam  Ortho Exam Left knee exam is stable.  No real ttp. Specialty Comments:  No specialty comments available.  Imaging: No results found.   PMFS History: Patient Active Problem List   Diagnosis Date Noted  . Sprain of medial collateral ligament of left knee 06/07/2016  . Acute pain of left knee 06/02/2016  . Bipolar disorder, curr episode mixed, severe, with psychotic features ([place]) 08/26/2015  . History of posttraumatic stress disorder (PTSD) 08/26/2015  . Essential hypertension 08/26/2015  . History of eczema 04/08/2015  . Pedal edema 08/06/2014  . Positive ANA (antinuclear antibody) 12/19/2012   . Biological false positive RPR test 08/18/2012  . HYPERCHOLESTEROLEMIA 04/28/2009  . HIDRADENITIS SUPPURATIVA 09/06/2007  . OBESITY 04/17/2007   Past Medical History:  Diagnosis Date  . Allergy    mild  . Anxiety    bipolar   . Arthritis    both knees  . Bipolar 1 disorder ([place])    Hospitalized multiple times for bipolar disorder (DC - [facility], [facility], and [facility], most recently in [place])  . Depression    bipolar  . Hidradenitis suppurativa   . Hypertension     Family History  Problem Relation Age of Onset  . Depression Mother   . Pulmonary embolism Mother   . Bipolar disorder Mother   . Cancer Father     ?prostate cancer  . Hypertension Maternal Grandfather     had colostomy bag- unsure reason   . Hypertension Paternal Grandmother   . Colon cancer Neg Hx   . Colon polyps Neg Hx   . Stomach cancer Neg Hx   . Rectal cancer Neg Hx   . Breast cancer Neg Hx   . Esophageal cancer Neg Hx     Past Surgical History:  Procedure Laterality Date  . INDUCED ABORTION     in patient's 60s in  [place].  . WISDOM TOOTH EXTRACTION     Social History   Occupational History  . Not on file.   Social History Main Topics  . Smoking status: Former Smoker    Types: Cigarettes  . Smokeless tobacco: Never Used     Comment: Smoked few cigarettes, socially, "did not inhale"  . Alcohol use No     Comment: Social alcohol use when younger  . Drug use: No     Comment: Social marijuana use when younger  . Sexual activity: Not on file

## 2016-07-27 ENCOUNTER — Encounter: Payer: Self-pay | Admitting: Family Medicine

## 2016-07-27 ENCOUNTER — Other Ambulatory Visit (HOSPITAL_COMMUNITY)
Admission: RE | Admit: 2016-07-27 | Discharge: 2016-07-27 | Disposition: A | Discharge status: Home / Self Care | Payer: Medicaid Other | Source: Ambulatory Visit | Attending: Family Medicine | Admitting: Family Medicine

## 2016-07-27 ENCOUNTER — Ambulatory Visit (INDEPENDENT_AMBULATORY_CARE_PROVIDER_SITE_OTHER): Payer: Medicaid Other | Admitting: Family Medicine

## 2016-07-27 VITALS — BP 136/75 | HR 81 | Temp 98.0°F | Resp 18 | Wt 259.4 lb

## 2016-07-27 DIAGNOSIS — Z01419 Encounter for gynecological examination (general) (routine) without abnormal findings: Secondary | ICD-10-CM | POA: Diagnosis not present

## 2016-07-27 DIAGNOSIS — Z113 Encounter for screening for infections with a predominantly sexual mode of transmission: Secondary | ICD-10-CM | POA: Insufficient documentation

## 2016-07-27 LAB — POCT URINALYSIS DIP (DEVICE)
Bilirubin Urine: NEGATIVE
Glucose, UA: NEGATIVE mg/dL
Hgb urine dipstick: NEGATIVE
Leukocytes, UA: NEGATIVE
Nitrite: NEGATIVE
Protein, ur: NEGATIVE mg/dL
Specific Gravity, Urine: 1.02 (ref 1.005–1.030)
Urobilinogen, UA: 0.2 mg/dL (ref 0.0–1.0)
pH: 6.5 (ref 5.0–8.0)
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
## 2016-07-27 NOTE — Patient Instructions (Addendum)
Pelvic Exam A pelvic exam is an exam of a woman's outer and inner genitals and reproductive organs. Pelvic exams are done to screen for health problems and to help prevent health problems from developing. You should start having pelvic exams when you turn 55 years old, unless your health care provider recommends having a pelvic exam earlier. Talk with your health care provider about how often you should have a pelvic exam. During your pelvic exam, your health care provider may ask you questions about your health, your family's health, your menstrual periods, immunizations, and your sexual activity. The information shared between you and your health care provider will not be shared with anyone else. What are some reasons to have a pelvic exam? There are many possible reasons for having a pelvic exam. A pelvic exam may be recommended to check for:  Normal development and function of the reproductive organs.  Cancer of the ovaries, uterus, or vagina.  Signs of sexually transmitted infections (STIs) or other types of infections.  Pregnancy. If you are pregnant, a pelvic exam can also help determine how far along you are in your pregnancy.  Widening (dilation) of the cervix during labor.  Injury (trauma) to the reproductive organs. A pelvic exam may be recommended to help explain or diagnose:  Changes in your body that may be signs of cancer in the reproductive system.  Inability to get pregnant (infertility).  Vaginal itching or burning.  Abnormal vaginal discharge or bleeding.  Problems with sexual function.  Problems with urination, such as:  Painful urination.  Frequent urinary tract infections.  Inability to control when you urinate (urinary incontinence).  Problems with menstrual periods, such as:  Severe cramping.  Absence of any menstrual flow in a female by the age of 54 years (primary amenorrhea).  Stopping of menstrual flow for 3-6 months at a time (secondary  amenorrhea). Depending on the purpose of your pelvic exam, your health care provider may perform:  A Pap test. This is sometimes called a Pap smear. It is a screening test that is used to check for signs of cancer of the vagina, cervix, and uterus. The test can also identify the presence of infection or precancerous changes.  A cervical biopsy. This is the removal of a small sample of tissue from the cervix. The cervix is the lowest part of the womb (uterus), which opens into the vagina (birth canal). The tissue will be checked under a microscope.  Other diagnostic tests that involve taking samples of tissue or fluid (cultures). If you have tests done, it is your responsibility to get your test results. Ask your health care provider or the department performing the test when your results will be ready. How is a pelvic exam performed? Usually, a physical exam is done first. This may include:  An exam of your breasts. Your health care provider may feel your breasts to check for abnormalities.  An exam of your abdomen. Your health care provider may press on your abdomen to check for abnormalities. Pelvic exams may vary among health care providers and hospitals. The following things are usually done during a pelvic exam:  You will remove your clothes from the waist down. You will put on a gown or a wrap to cover yourself while you get ready for the exam.  You will lie on your back on a special table. Your feet will be placed into foot rests (stirrups) so that your legs are wide apart and your knees are bent. A drape  will be placed over your abdomen and your legs.  Your health care provider will examine your outer genitals to check for anything unusual. This includes your clitoris, urethra, vaginal opening, labia, and the skin between your vagina and your anus (perineum).  Your health care provider will examine your inner genitals. To do this, a lubricated instrument (speculum) will be inserted  into your vagina. The speculum will be widened to open the walls of your vagina.  Your health care provider will examine your vagina and cervix.  A Pap test, cervical biopsy, or cultures may be done as needed.  After the internal exam is done, the speculum will be removed.  Your health care provider will put on germ-free (sterile) latex gloves and insert two fingers into your vagina to gently press against various organs.  Your health care provider may use his or her other hand to gently press on your lower abdomen while doing this. A pelvic exam is usually painless, although it can cause mild discomfort. If you experience pain at any time during your pelvic exam, tell your health care provider right away. When should I seek medical care? Seek medical care after your pelvic exam if:  You develop new symptoms.  You experience pain or discomfort from anything that was done during your pelvic exam. This information is not intended to replace advice given to you by your health care provider. Make sure you discuss any questions you have with your health care provider. Document Released: 08/06/2002 Document Revised: 09/30/2015 Document Reviewed: 02/17/2015 Elsevier Interactive Patient Education  2017 [employer].

## 2016-07-29 ENCOUNTER — Ambulatory Visit
Admission: RE | Admit: 2016-07-29 | Discharge: 2016-07-29 | Disposition: A | Discharge status: Home / Self Care | Payer: Medicaid Other | Source: Ambulatory Visit | Attending: Family Medicine | Admitting: Family Medicine

## 2016-07-29 DIAGNOSIS — Z1231 Encounter for screening mammogram for malignant neoplasm of breast: Secondary | ICD-10-CM

## 2016-07-29 LAB — CYTOLOGY - PAP
Adequacy: ABSENT
Bacterial vaginitis: POSITIVE — AB
Candida vaginitis: NEGATIVE
Chlamydia: NEGATIVE
Diagnosis: NEGATIVE
Neisseria Gonorrhea: NEGATIVE

## 2016-08-01 ENCOUNTER — Other Ambulatory Visit: Payer: Self-pay | Admitting: Family Medicine

## 2016-08-01 DIAGNOSIS — B9689 Other specified bacterial agents as the cause of diseases classified elsewhere: Secondary | ICD-10-CM

## 2016-08-01 DIAGNOSIS — N760 Acute vaginitis: Principal | ICD-10-CM

## 2016-08-01 MED ORDER — METRONIDAZOLE 500 MG PO TABS: 500.0000 mg | ORAL_TABLET | Freq: Two times a day (BID) | ORAL | 0 refills | Status: DC

## 2016-08-01 NOTE — Progress Notes (Signed)
Called and spoke with patient. Advised of Normal pap besides bacterial vaginosis. Advised patient to take flagyl as prescribed until completed. Advised patient to not Drink alcohol while on medication. Patient verbalized understanding and was made aware pap is not due for 3 more years. Thanks!

## 2016-09-13 ENCOUNTER — Encounter (INDEPENDENT_AMBULATORY_CARE_PROVIDER_SITE_OTHER): Payer: Self-pay

## 2016-09-13 ENCOUNTER — Ambulatory Visit (INDEPENDENT_AMBULATORY_CARE_PROVIDER_SITE_OTHER): Payer: Medicaid Other | Admitting: Orthopaedic Surgery

## 2016-09-13 DIAGNOSIS — M1712 Unilateral primary osteoarthritis, left knee: Secondary | ICD-10-CM | POA: Diagnosis not present

## 2016-09-13 MED ORDER — BUPIVACAINE HCL 0.5 % IJ SOLN
2.0000 mL | INTRAMUSCULAR | Status: AC | PRN
  Administered 2016-09-13: 2 mL via INTRA_ARTICULAR

## 2016-09-13 MED ORDER — METHYLPREDNISOLONE ACETATE 40 MG/ML IJ SUSP
40.0000 mg | INTRAMUSCULAR | Status: AC | PRN
  Administered 2016-09-13: 40 mg via INTRA_ARTICULAR

## 2016-09-13 MED ORDER — LIDOCAINE HCL 1 % IJ SOLN
2.0000 mL | INTRAMUSCULAR | Status: AC | PRN
  Administered 2016-09-13: 2 mL

## 2016-09-13 NOTE — Progress Notes (Signed)
Office Visit Note   Patient: Stacie Daugherty           Date of Birth: Sep 19, 1960           MRN: [id number] Visit Date: 09/13/2016              Requested by: [name], FNP [address], [place] [address] PCP: [name], FNP   Assessment & Plan: Visit Diagnoses:  1. Unilateral primary osteoarthritis, left knee     Plan: Left knee cortisone injection was performed for degenerative joint disease. Patient tolerates well. Follow-up with me as needed.  Follow-Up Instructions: Return if symptoms worsen or fail to improve.   Orders:  No orders of the defined types were placed in this encounter.  No orders of the defined types were placed in this encounter.     Procedures: Large Joint Inj Date/Time: 09/13/2016 12:52 PM Performed by: Leandrew Koyanagi Authorized by: Leandrew Koyanagi   Consent Given by:  Patient Timeout: prior to procedure the correct patient, procedure, and site was verified   Indications:  Pain Location:  Knee Site:  R knee Prep: patient was prepped and draped in usual sterile fashion   Needle Size:  22 G Ultrasound Guidance: No   Fluoroscopic Guidance: No   Arthrogram: No   Medications:  2 mL lidocaine 1 %; 2 mL bupivacaine 0.5 %; 40 mg methylPREDNISolone acetate 40 MG/ML Patient tolerance:  Patient tolerated the procedure well with no immediate complications     Clinical Data: No additional findings.   Subjective: Chief Complaint  Patient presents with  . Left Knee - Pain    Patient comes back today for continued left knee pain. She is having difficulty walking. She does limp. Pain does not radiate. She endorses start up pain.    Review of Systems  Constitutional: Negative.   HENT: Negative.   Eyes: Negative.   Respiratory: Negative.   Cardiovascular: Negative.   Endocrine: Negative.   Musculoskeletal: Negative.   Neurological: Negative.   Hematological: Negative.   Psychiatric/Behavioral: Negative.   All  other systems reviewed and are negative.    Objective: Vital Signs: There were no vitals taken for this visit.  Physical Exam  Constitutional: She is oriented to person, place, and time. She appears well-developed and well-nourished.  Pulmonary/Chest: Effort normal.  Neurological: She is alert and oriented to person, place, and time.  Skin: Skin is warm. Capillary refill takes less than 2 seconds.  Psychiatric: She has a normal mood and affect. Her behavior is normal. Judgment and thought content normal.  Nursing note and vitals reviewed.   Ortho Exam Left knee exam shows no joint effusion. Essentially an unchanged exam from previous visit. Specialty Comments:  No specialty comments available.  Imaging: No results found.   PMFS History: Patient Active Problem List   Diagnosis Date Noted  . Unilateral primary osteoarthritis, left knee 09/13/2016  . Encounter for gynecological examination without abnormal finding 07/27/2016  . Sprain of medial collateral ligament of left knee 06/07/2016  . Acute pain of left knee 06/02/2016  . Bipolar disorder, curr episode mixed, severe, with psychotic features ([place]) 08/26/2015  . History of posttraumatic stress disorder (PTSD) 08/26/2015  . Essential hypertension 08/26/2015  . History of eczema 04/08/2015  . Pedal edema 08/06/2014  . Positive ANA (antinuclear antibody) 12/19/2012  . Biological false positive RPR test 08/18/2012  . HYPERCHOLESTEROLEMIA 04/28/2009  . HIDRADENITIS SUPPURATIVA 09/06/2007  . OBESITY 04/17/2007   Past  Medical History:  Diagnosis Date  . Allergy    mild  . Anxiety    bipolar   . Arthritis    both knees  . Bipolar 1 disorder ([place])    Hospitalized multiple times for bipolar disorder (DC - [facility], [facility], and [facility], most recently in [place])  . Depression    bipolar  . Hidradenitis suppurativa   . Hypertension     Family History  Problem Relation Age of Onset    . Depression Mother   . Pulmonary embolism Mother   . Bipolar disorder Mother   . Cancer Father     ?prostate cancer  . Hypertension Maternal Grandfather     had colostomy bag- unsure reason   . Hypertension Paternal Grandmother   . Colon cancer Neg Hx   . Colon polyps Neg Hx   . Stomach cancer Neg Hx   . Rectal cancer Neg Hx   . Breast cancer Neg Hx   . Esophageal cancer Neg Hx     Past Surgical History:  Procedure Laterality Date  . INDUCED ABORTION     in patient's 64s [place]  . WISDOM TOOTH EXTRACTION     Social History   Occupational History  . Not on file.   Social History Main Topics  . Smoking status: Former Smoker    Types: Cigarettes  . Smokeless tobacco: Never Used     Comment: Smoked few cigarettes, socially, "did not inhale"  . Alcohol use No     Comment: Social alcohol use when younger  . Drug use: No     Comment: Social marijuana use when younger  . Sexual activity: Not on file

## 2016-09-23 ENCOUNTER — Ambulatory Visit (INDEPENDENT_AMBULATORY_CARE_PROVIDER_SITE_OTHER): Payer: Medicaid Other | Admitting: Family Medicine

## 2016-09-23 ENCOUNTER — Encounter: Payer: Self-pay | Admitting: Family Medicine

## 2016-09-23 VITALS — BP 140/86 | HR 94 | Temp 98.3°F | Resp 16 | Ht 63.0 in | Wt 261.0 lb

## 2016-09-23 DIAGNOSIS — Z780 Asymptomatic menopausal state: Secondary | ICD-10-CM

## 2016-09-23 DIAGNOSIS — N760 Acute vaginitis: Secondary | ICD-10-CM | POA: Diagnosis not present

## 2016-09-23 DIAGNOSIS — N898 Other specified noninflammatory disorders of vagina: Secondary | ICD-10-CM | POA: Diagnosis not present

## 2016-09-23 DIAGNOSIS — L298 Other pruritus: Secondary | ICD-10-CM | POA: Diagnosis not present

## 2016-09-23 DIAGNOSIS — B9689 Other specified bacterial agents as the cause of diseases classified elsewhere: Secondary | ICD-10-CM | POA: Diagnosis not present

## 2016-09-23 LAB — POCT URINALYSIS DIP (DEVICE)
Bilirubin Urine: NEGATIVE
Glucose, UA: NEGATIVE mg/dL
Nitrite: NEGATIVE
Protein, ur: NEGATIVE mg/dL
Specific Gravity, Urine: 1.02 (ref 1.005–1.030)
Urobilinogen, UA: 0.2 mg/dL (ref 0.0–1.0)
pH: 6 (ref 5.0–8.0)

## 2016-09-23 MED ORDER — METRONIDAZOLE 0.75 % EX GEL: 1.0000 "application " | Freq: Two times a day (BID) | CUTANEOUS | 0 refills | Status: AC

## 2016-09-23 NOTE — Patient Instructions (Addendum)
Menopause is the time that marks the end of your menstrual cycles. It's diagnosed after you've gone 12 months without a menstrual period.  Menopause can happen in your 45s or 22s, but the average age is 1 in the [place]. Menopause is a natural biological process. But the physical symptoms, such as hot flashes, and emotional symptoms of menopause may disrupt your sleep, lower your energy or affect emotional health.  There are many effective treatments available, from lifestyle adjustments to hormone therapy. Cool hot flashes. Dress in layers, have a cold glass of water or go somewhere cooler. Try to pinpoint what triggers your hot flashes. For many women, triggers may include hot beverages, caffeine, spicy foods, alcohol, stress, hot weather and even a warm room.  Decrease vaginal discomfort or dryness. Use over-the-counter, water-based vaginal lubricants (Astroglide, K-Y jelly, others), silicone-based lubricants or moisturizers (Replens, others). Choose products that don't contain glycerin, which can cause burning or irritation in women who are sensitive to that chemical. Staying sexually active also helps by increasing blood flow to the vagina.  Get enough sleep. Avoid caffeine, which can make it hard to get to sleep, and avoid drinking too much alcohol, which can interrupt sleep. Exercise during the day, although not right before bedtime. If hot flashes disturb your sleep, you may need to find a way to manage them before you can get adequate rest.  Practice relaxation techniques. Techniques such as deep breathing, paced breathing, guided imagery, massage and progressive muscle relaxation may help with menopausal symptoms. You can find a number of books, CDs and online offerings on different relaxation exercises.  Strengthen your pelvic floor. Pelvic floor muscle exercises, called Kegel exercises, can improve some forms of urinary incontinence.  Eat a balanced diet. Include a variety of fruits,  vegetables and whole grains. Limit saturated fats, oils and sugars. Ask your provider if you need calcium or vitamin D supplements to help meet daily requirements.  Don't smoke. Smoking increases your risk of heart disease, stroke, osteoporosis, cancer and a range of other health problems. It may also increase hot flashes and bring on earlier menopause.  Exercise regularly. Get regular physical activity or exercise on most days to help protect against heart disease, diabetes, osteoporosis and other conditions associated with aging.

## 2016-09-26 ENCOUNTER — Other Ambulatory Visit: Payer: Self-pay | Admitting: Family Medicine

## 2016-09-26 ENCOUNTER — Ambulatory Visit: Payer: Self-pay | Admitting: Family Medicine

## 2016-09-26 LAB — POCT WET PREP (WET MOUNT): Trichomonas Wet Prep HPF POC: ABSENT
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
## 2016-10-11 ENCOUNTER — Other Ambulatory Visit: Payer: Self-pay | Admitting: Family Medicine

## 2016-10-11 DIAGNOSIS — I10 Essential (primary) hypertension: Secondary | ICD-10-CM

## 2016-10-25 ENCOUNTER — Ambulatory Visit: Payer: Self-pay | Admitting: Family Medicine

## 2017-01-11 ENCOUNTER — Other Ambulatory Visit: Payer: Self-pay | Admitting: Family Medicine

## 2017-01-11 DIAGNOSIS — I10 Essential (primary) hypertension: Secondary | ICD-10-CM

## 2017-02-27 ENCOUNTER — Other Ambulatory Visit (HOSPITAL_COMMUNITY)
Admission: RE | Admit: 2017-02-27 | Discharge: 2017-02-27 | Disposition: A | Discharge status: Home / Self Care | Payer: Medicaid Other | Source: Ambulatory Visit | Attending: Family Medicine | Admitting: Family Medicine

## 2017-02-27 ENCOUNTER — Ambulatory Visit (INDEPENDENT_AMBULATORY_CARE_PROVIDER_SITE_OTHER): Payer: Medicaid Other | Admitting: Family Medicine

## 2017-02-27 ENCOUNTER — Encounter: Payer: Self-pay | Admitting: Family Medicine

## 2017-02-27 VITALS — BP 128/88 | HR 76 | Temp 98.1°F | Resp 16 | Ht 63.0 in | Wt 258.0 lb

## 2017-02-27 DIAGNOSIS — I10 Essential (primary) hypertension: Secondary | ICD-10-CM | POA: Diagnosis not present

## 2017-02-27 DIAGNOSIS — N898 Other specified noninflammatory disorders of vagina: Secondary | ICD-10-CM | POA: Insufficient documentation

## 2017-02-27 DIAGNOSIS — L309 Dermatitis, unspecified: Secondary | ICD-10-CM | POA: Diagnosis not present

## 2017-02-27 DIAGNOSIS — E7849 Other hyperlipidemia: Secondary | ICD-10-CM | POA: Diagnosis not present

## 2017-02-27 DIAGNOSIS — Z23 Encounter for immunization: Secondary | ICD-10-CM

## 2017-02-27 DIAGNOSIS — F3164 Bipolar disorder, current episode mixed, severe, with psychotic features: Secondary | ICD-10-CM | POA: Diagnosis not present

## 2017-02-27 LAB — POCT URINALYSIS DIP (DEVICE)
Bilirubin Urine: NEGATIVE
Glucose, UA: NEGATIVE mg/dL
Hgb urine dipstick: NEGATIVE
Ketones, ur: NEGATIVE mg/dL
Leukocytes, UA: NEGATIVE
Nitrite: NEGATIVE
Protein, ur: NEGATIVE mg/dL
Specific Gravity, Urine: 1.02 (ref 1.005–1.030)
Urobilinogen, UA: 0.2 mg/dL (ref 0.0–1.0)
pH: 7 (ref 5.0–8.0)

## 2017-02-27 LAB — BASIC METABOLIC PANEL WITH GFR
BUN: 12 mg/dL (ref 7–25)
CO2: 29 mmol/L (ref 20–32)
Calcium: 9.4 mg/dL (ref 8.6–10.4)
Chloride: 103 mmol/L (ref 98–110)
Creat: 0.83 mg/dL (ref 0.50–1.05)
GFR, Est African American: 92 mL/min/{1.73_m2} (ref >=60)
GFR, Est Non African American: 79 mL/min/{1.73_m2} (ref >=60)
Glucose, Bld: 108 mg/dL — ABNORMAL HIGH (ref 65–99)
Potassium: 4.4 mmol/L (ref 3.5–5.3)
Sodium: 139 mmol/L (ref 135–146)

## 2017-02-27 MED ORDER — AMLODIPINE BESYLATE 10 MG PO TABS: 10.0000 mg | ORAL_TABLET | Freq: Every day | ORAL | 1 refills | Status: DC

## 2017-02-27 MED ORDER — CARVEDILOL 3.125 MG PO TABS: ORAL_TABLET | ORAL | 1 refills | Status: DC

## 2017-02-27 MED ORDER — ATORVASTATIN CALCIUM 20 MG PO TABS: 20.0000 mg | ORAL_TABLET | Freq: Every day | ORAL | 3 refills | Status: DC

## 2017-02-27 MED ORDER — TRIAMCINOLONE ACETONIDE 0.5 % EX OINT: 1.0000 "application " | TOPICAL_OINTMENT | Freq: Two times a day (BID) | CUTANEOUS | 0 refills | Status: DC
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
## 2017-02-28 LAB — CERVICOVAGINAL ANCILLARY ONLY
Bacterial vaginitis: NEGATIVE
Candida vaginitis: NEGATIVE
Trichomonas: NEGATIVE

## 2017-03-01 NOTE — Patient Instructions (Signed)
Blood pressure is at goal on current medication regimen.  No changes warranted Will follow up by phone with any abnormal laboratory results Will follow up in 1 month for fasting cholesterol panel Recommend a lowfat, low carbohydrate diet divided over 5-6 small meals, increase water intake to 6-8 glasses, and 150 minutes per week of cardiovascular exercise.  - Continue medication, monitor blood pressure at home. Continue DASH diet. Reminder to go to the ER if any CP, SOB, nausea, dizziness, severe HA, changes vision/speech, left arm numbness and tingling and jaw pain.   DASH Eating Plan DASH stands for "Dietary Approaches to Stop Hypertension." The DASH eating plan is a healthy eating plan that has been shown to reduce high blood pressure (hypertension). It may also reduce your risk for type 2 diabetes, heart disease, and stroke. The DASH eating plan may also help with weight loss. What are tips for following this plan? General guidelines  Avoid eating more than 2,300 mg (milligrams) of salt (sodium) a day. If you have hypertension, you may need to reduce your sodium intake to 1,500 mg a day.  Limit alcohol intake to no more than 1 drink a day for nonpregnant women and 2 drinks a day for men. One drink equals 12 oz of beer, 5 oz of wine, or 1 oz of hard liquor.  Work with your health care provider to maintain a healthy body weight or to lose weight. Ask what an ideal weight is for you.  Get at least 30 minutes of exercise that causes your heart to beat faster (aerobic exercise) most days of the week. Activities may include walking, swimming, or biking.  Work with your health care provider or diet and nutrition specialist (dietitian) to adjust your eating plan to your individual calorie needs. Reading food labels  Check food labels for the amount of sodium per serving. Choose foods with less than 5 percent of the Daily Value of sodium. Generally, foods with less than 300 mg of sodium per  serving fit into this eating plan.  To find whole grains, look for the word "whole" as the first word in the ingredient list. Shopping  Buy products labeled as "low-sodium" or "no salt added."  Buy fresh foods. Avoid canned foods and premade or frozen meals. Cooking  Avoid adding salt when cooking. Use salt-free seasonings or herbs instead of table salt or sea salt. Check with your health care provider or pharmacist before using salt substitutes.  Do not fry foods. Cook foods using healthy methods such as baking, boiling, grilling, and broiling instead.  Cook with heart-healthy oils, such as olive, canola, soybean, or sunflower oil. Meal planning   Eat a balanced diet that includes: ? 5 or more servings of fruits and vegetables each day. At each meal, try to fill half of your plate with fruits and vegetables. ? Up to 6-8 servings of whole grains each day. ? Less than 6 oz of lean meat, poultry, or fish each day. A 3-oz serving of meat is about the same size as a deck of cards. One egg equals 1 oz. ? 2 servings of low-fat dairy each day. ? A serving of nuts, seeds, or beans 5 times each week. ? Heart-healthy fats. Healthy fats called Omega-3 fatty acids are found in foods such as flaxseeds and coldwater fish, like sardines, salmon, and mackerel.  Limit how much you eat of the following: ? Canned or prepackaged foods. ? Food that is high in trans fat, such as fried foods. ?  Food that is high in saturated fat, such as fatty meat. ? Sweets, desserts, sugary drinks, and other foods with added sugar. ? Full-fat dairy products.  Do not salt foods before eating.  Try to eat at least 2 vegetarian meals each week.  Eat more home-cooked food and less restaurant, buffet, and fast food.  When eating at a restaurant, ask that your food be prepared with less salt or no salt, if possible. What foods are recommended? The items listed may not be a complete list. Talk with your dietitian about  what dietary choices are best for you. Grains Whole-grain or whole-wheat bread. Whole-grain or whole-wheat pasta. Brown rice. [place]. Bulgur. Whole-grain and low-sodium cereals. Pita bread. Low-fat, low-sodium crackers. Whole-wheat flour tortillas. Vegetables Fresh or frozen vegetables (raw, steamed, roasted, or grilled). Low-sodium or reduced-sodium tomato and vegetable juice. Low-sodium or reduced-sodium tomato sauce and tomato paste. Low-sodium or reduced-sodium canned vegetables. Fruits All fresh, dried, or frozen fruit. Canned fruit in natural juice (without added sugar). Meat and other protein foods Skinless chicken or [place]. Ground chicken or [place]. Pork with fat trimmed off. Fish and seafood. Egg whites. Dried beans, peas, or lentils. Unsalted nuts, nut butters, and seeds. Unsalted canned beans. Lean cuts of beef with fat trimmed off. Low-sodium, lean deli meat. Dairy Low-fat (1%) or fat-free (skim) milk. Fat-free, low-fat, or reduced-fat cheeses. Nonfat, low-sodium ricotta or cottage cheese. Low-fat or nonfat yogurt. Low-fat, low-sodium cheese. Fats and oils Soft margarine without trans fats. Vegetable oil. Low-fat, reduced-fat, or light mayonnaise and salad dressings (reduced-sodium). Canola, safflower, olive, soybean, and sunflower oils. Avocado. Seasoning and other foods Herbs. Spices. Seasoning mixes without salt. Unsalted popcorn and pretzels. Fat-free sweets. What foods are not recommended? The items listed may not be a complete list. Talk with your dietitian about what dietary choices are best for you. Grains Baked goods made with fat, such as croissants, muffins, or some breads. Dry pasta or rice meal packs. Vegetables Creamed or fried vegetables. Vegetables in a cheese sauce. Regular canned vegetables (not low-sodium or reduced-sodium). Regular canned tomato sauce and paste (not low-sodium or reduced-sodium). Regular tomato and vegetable juice (not low-sodium or  reduced-sodium). [name]. Olives. Fruits Canned fruit in a light or heavy syrup. Fried fruit. Fruit in cream or butter sauce. Meat and other protein foods Fatty cuts of meat. Ribs. Fried meat. Berniece Salines. Sausage. Bologna and other processed lunch meats. Salami. Fatback. Hotdogs. Bratwurst. Salted nuts and seeds. Canned beans with added salt. Canned or smoked fish. Whole eggs or egg yolks. Chicken or [place] with skin. Dairy Whole or 2% milk, cream, and half-and-half. Whole or full-fat cream cheese. Whole-fat or sweetened yogurt. Full-fat cheese. Nondairy creamers. Whipped toppings. Processed cheese and cheese spreads. Fats and oils Butter. Stick margarine. Lard. Shortening. Ghee. Bacon fat. Tropical oils, such as coconut, palm kernel, or palm oil. Seasoning and other foods Salted popcorn and pretzels. Onion salt, garlic salt, seasoned salt, table salt, and sea salt. Worcestershire sauce. Tartar sauce. Barbecue sauce. Teriyaki sauce. Soy sauce, including reduced-sodium. Steak sauce. Canned and packaged gravies. Fish sauce. Oyster sauce. Cocktail sauce. Horseradish that you find on the shelf. Ketchup. Mustard. Meat flavorings and tenderizers. Bouillon cubes. Hot sauce and Tabasco sauce. Premade or packaged marinades. Premade or packaged taco seasonings. Relishes. Regular salad dressings. Where to find more information:  National Heart, Lung, and [place]: https://wilson-eaton.com/  American Heart Association: www.heart.org Summary  The DASH eating plan is a healthy eating plan that has been shown to reduce high blood  pressure (hypertension). It may also reduce your risk for type 2 diabetes, heart disease, and stroke.  With the DASH eating plan, you should limit salt (sodium) intake to 2,300 mg a day. If you have hypertension, you may need to reduce your sodium intake to 1,500 mg a day.  When on the DASH eating plan, aim to eat more fresh fruits and vegetables, whole grains, lean proteins, low-fat  dairy, and heart-healthy fats.  Work with your health care provider or diet and nutrition specialist (dietitian) to adjust your eating plan to your individual calorie needs. This information is not intended to replace advice given to you by your health care provider. Make sure you discuss any questions you have with your health care provider. Document Released: 05/05/2011 Document Revised: 05/09/2016 Document Reviewed: 05/09/2016 Elsevier Interactive Patient Education  2017 [employer].

## 2017-03-16 ENCOUNTER — Ambulatory Visit (INDEPENDENT_AMBULATORY_CARE_PROVIDER_SITE_OTHER): Payer: Medicaid Other | Admitting: Orthopaedic Surgery

## 2017-03-16 DIAGNOSIS — M1712 Unilateral primary osteoarthritis, left knee: Secondary | ICD-10-CM | POA: Diagnosis not present

## 2017-03-16 NOTE — Progress Notes (Signed)
Office Visit Note   Patient: Stacie Daugherty           Date of Birth: [birth year]           MRN: [id number] Visit Date: 03/16/2017              Requested by: [name], FNP [address], [place] [address] PCP: [name], FNP   Assessment & Plan: Visit Diagnoses:  1. Unilateral primary osteoarthritis, left knee     Plan: I discussed with her at length that a repeat cortisone injection is unlikely going to give her any better or more relief than the previous injection. She does have advanced degenerative joint disease of the left knee which I have recommended a total knee replacement for but she is not ready for this therefore we will have her see [place] back as needed. Questions encouraged and answered.  Follow-Up Instructions: Return if symptoms worsen or fail to improve.   Orders:  No orders of the defined types were placed in this encounter.  No orders of the defined types were placed in this encounter.     Procedures: No procedures performed   Clinical Data: No additional findings.   Subjective: No chief complaint on file.   Patient is a 56 year old female comes in for follow-up of her left knee degenerative joint disease. She states that she continues to have chronic pain of her knee. It feels unstable and stiff when she first begins to walk. She had minimal relief from the previous cortisone injection.    Review of Systems  Constitutional: Negative.   HENT: Negative.   Eyes: Negative.   Respiratory: Negative.   Cardiovascular: Negative.   Endocrine: Negative.   Musculoskeletal: Negative.   Neurological: Negative.   Hematological: Negative.   Psychiatric/Behavioral: Negative.   All other systems reviewed and are negative.    Objective: Vital Signs: There were no vitals taken for this visit.  Physical Exam  Constitutional: She is oriented to person, place, and time. She appears well-developed and well-nourished.    Pulmonary/Chest: Effort normal.  Neurological: She is alert and oriented to person, place, and time.  Skin: Skin is warm. Capillary refill takes less than 2 seconds.  Psychiatric: She has a normal mood and affect. Her behavior is normal. Judgment and thought content normal.  Nursing note and vitals reviewed.   Ortho Exam Left knee exam is stable Specialty Comments:  No specialty comments available.  Imaging: No results found.   PMFS History: Patient Active Problem List   Diagnosis Date Noted  . Unilateral primary osteoarthritis, left knee 09/13/2016  . Encounter for gynecological examination without abnormal finding 07/27/2016  . Sprain of medial collateral ligament of left knee 06/07/2016  . Acute pain of left knee 06/02/2016  . Bipolar disorder, curr episode mixed, severe, with psychotic features ([place]) 08/26/2015  . History of posttraumatic stress disorder (PTSD) 08/26/2015  . Essential hypertension 08/26/2015  . History of eczema 04/08/2015  . Pedal edema 08/06/2014  . Positive ANA (antinuclear antibody) 12/19/2012  . Biological false positive RPR test 08/18/2012  . HYPERCHOLESTEROLEMIA 04/28/2009  . HIDRADENITIS SUPPURATIVA 09/06/2007  . OBESITY 04/17/2007   Past Medical History:  Diagnosis Date  . Allergy    mild  . Anxiety    bipolar   . Arthritis    both knees  . Bipolar 1 disorder ([place])    Hospitalized multiple times for bipolar disorder ([facility] - [facility], [facility], most recently in [place])  . Depression    bipolar  . Hidradenitis suppurativa   . Hypertension     Family History  Problem Relation Age of Onset  . Depression Mother   . Pulmonary embolism Mother   . Bipolar disorder Mother   . Cancer Father        ?prostate cancer  . Hypertension Maternal Grandfather        had colostomy bag- unsure reason   . Hypertension Paternal Grandmother   . Colon cancer Neg Hx   . Colon polyps Neg Hx   . Stomach  cancer Neg Hx   . Rectal cancer Neg Hx   . Breast cancer Neg Hx   . Esophageal cancer Neg Hx     Past Surgical History:  Procedure Laterality Date  . INDUCED ABORTION     in patient's 10s in [place], [place].  . WISDOM TOOTH EXTRACTION     Social History   Occupational History  . Not on file.   Social History Main Topics  . Smoking status: Former Smoker    Types: Cigarettes  . Smokeless tobacco: Never Used     Comment: Smoked few cigarettes, socially, "did not inhale"  . Alcohol use No     Comment: Social alcohol use when younger  . Drug use: No     Comment: Social marijuana use when younger  . Sexual activity: Not on file

## 2017-03-31 ENCOUNTER — Other Ambulatory Visit (INDEPENDENT_AMBULATORY_CARE_PROVIDER_SITE_OTHER): Payer: Medicaid Other

## 2017-03-31 DIAGNOSIS — E7849 Other hyperlipidemia: Secondary | ICD-10-CM

## 2017-03-31 LAB — LIPID PANEL
Cholesterol: 162 mg/dL (ref <200)
HDL: 83 mg/dL (ref >50)
LDL Cholesterol (Calc): 64 mg/dL (calc)
Non-HDL Cholesterol (Calc): 79 mg/dL (calc) (ref <130)
Total CHOL/HDL Ratio: 2 (calc) (ref <5.0)
Triglycerides: 72 mg/dL (ref <150)

## 2017-04-03 ENCOUNTER — Other Ambulatory Visit: Payer: Self-pay | Admitting: Family Medicine

## 2017-04-03 ENCOUNTER — Telehealth: Payer: Self-pay

## 2017-04-03 NOTE — Telephone Encounter (Signed)
-----   Message from [name], [place] sent at 04/03/2017  8:53 AM EST ----- Regarding: Lab results Please inform patient that total cholesterol and LDL cholesterol has improved over the past 6 months.  We will continue ASA and statin therapy as previously prescribed.  Recommend a low-fat low-sodium diet divided over 5-6 small meals per day.  Patient to follow-up as previously scheduled.  Thanks

## 2017-04-03 NOTE — Telephone Encounter (Signed)
Called and spoke with patient. Advised that cholesterol has improved but to continue taking Asprin and Lipitor as directed. Advised patient to continue to work on low fat/low sodium diet over 5 to 6 small meals daily. Asked that patient keep next scheduled appointment. Thanks!

## 2017-07-28 ENCOUNTER — Other Ambulatory Visit: Payer: Self-pay | Admitting: Family Medicine

## 2017-07-28 DIAGNOSIS — Z1231 Encounter for screening mammogram for malignant neoplasm of breast: Secondary | ICD-10-CM

## 2017-08-18 ENCOUNTER — Ambulatory Visit
Admission: RE | Admit: 2017-08-18 | Discharge: 2017-08-18 | Disposition: A | Discharge status: Home / Self Care | Payer: Medicaid Other | Source: Ambulatory Visit | Attending: Family Medicine | Admitting: Family Medicine

## 2017-08-18 DIAGNOSIS — Z1231 Encounter for screening mammogram for malignant neoplasm of breast: Secondary | ICD-10-CM

## 2017-11-06 ENCOUNTER — Encounter: Payer: Self-pay | Admitting: Family Medicine

## 2017-11-06 ENCOUNTER — Ambulatory Visit (INDEPENDENT_AMBULATORY_CARE_PROVIDER_SITE_OTHER): Payer: Medicaid Other | Admitting: Family Medicine

## 2017-11-06 VITALS — BP 131/86 | HR 62 | Temp 98.3°F | Resp 16 | Ht 63.0 in | Wt 259.0 lb

## 2017-11-06 DIAGNOSIS — I10 Essential (primary) hypertension: Secondary | ICD-10-CM

## 2017-11-06 DIAGNOSIS — F319 Bipolar disorder, unspecified: Secondary | ICD-10-CM | POA: Diagnosis not present

## 2017-11-06 DIAGNOSIS — Z202 Contact with and (suspected) exposure to infections with a predominantly sexual mode of transmission: Secondary | ICD-10-CM | POA: Diagnosis not present

## 2017-11-06 DIAGNOSIS — N898 Other specified noninflammatory disorders of vagina: Secondary | ICD-10-CM

## 2017-11-06 DIAGNOSIS — R8290 Unspecified abnormal findings in urine: Secondary | ICD-10-CM

## 2017-11-06 LAB — POCT URINALYSIS DIPSTICK
Blood, UA: NEGATIVE
Glucose, UA: NEGATIVE
Ketones, UA: 15
Nitrite, UA: NEGATIVE
Protein, UA: POSITIVE — AB
Spec Grav, UA: >=1.03 — AB (ref 1.010–1.025)
Urobilinogen, UA: 1 E.U./dL
pH, UA: 5.5 (ref 5.0–8.0)

## 2017-11-06 NOTE — Patient Instructions (Addendum)
Will follow up by phone with any abnormal laboratory results.  Your blood pressure is at goal on current medication regimen. We have discussed target BP range and blood pressure goal. I have advised patient to check BP regularly and to call us back or report to clinic if the numbers are consistently higher than 140/90. We discussed the importance of compliance with medical therapy and DASH diet recommended, consequences of uncontrolled hypertension discussed.  - continue current BP medications
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
## 2017-11-07 LAB — GC/CHLAMYDIA PROBE AMP
Chlamydia trachomatis, NAA: NEGATIVE
Neisseria gonorrhoeae by PCR: NEGATIVE

## 2017-11-08 ENCOUNTER — Telehealth: Payer: Self-pay

## 2017-11-08 ENCOUNTER — Other Ambulatory Visit: Payer: Self-pay | Admitting: Family Medicine

## 2017-11-08 ENCOUNTER — Ambulatory Visit (INDEPENDENT_AMBULATORY_CARE_PROVIDER_SITE_OTHER): Payer: Medicaid Other

## 2017-11-08 ENCOUNTER — Other Ambulatory Visit: Payer: Self-pay

## 2017-11-08 DIAGNOSIS — A539 Syphilis, unspecified: Secondary | ICD-10-CM

## 2017-11-08 DIAGNOSIS — E7849 Other hyperlipidemia: Secondary | ICD-10-CM

## 2017-11-08 DIAGNOSIS — I10 Essential (primary) hypertension: Secondary | ICD-10-CM

## 2017-11-08 LAB — BASIC METABOLIC PANEL
BUN/Creatinine Ratio: 10 (ref 9–23)
BUN: 9 mg/dL (ref 6–24)
CO2: 23 mmol/L (ref 20–29)
Calcium: 9.8 mg/dL (ref 8.7–10.2)
Chloride: 103 mmol/L (ref 96–106)
Creatinine, Ser: 0.91 mg/dL (ref 0.57–1.00)
GFR calc Af Amer: 82 mL/min/{1.73_m2} (ref >59)
GFR calc non Af Amer: 71 mL/min/{1.73_m2} (ref >59)
Glucose: 86 mg/dL (ref 65–99)
Potassium: 4.1 mmol/L (ref 3.5–5.2)
Sodium: 141 mmol/L (ref 134–144)

## 2017-11-08 LAB — VAGINITIS/VAGINOSIS, DNA PROBE
Candida Species: NEGATIVE
Gardnerella vaginalis: NEGATIVE
Trichomonas vaginosis: NEGATIVE

## 2017-11-08 LAB — URINE CULTURE

## 2017-11-08 LAB — RPR, QUANT+TP ABS (REFLEX)
Rapid Plasma Reagin, Quant: 1:1 {titer} — ABNORMAL HIGH
T Pallidum Abs: NEGATIVE

## 2017-11-08 LAB — RPR: RPR Ser Ql: REACTIVE — AB

## 2017-11-08 LAB — HIV ANTIBODY (ROUTINE TESTING W REFLEX): HIV Screen 4th Generation wRfx: NONREACTIVE

## 2017-11-08 MED ORDER — CARVEDILOL 3.125 MG PO TABS: ORAL_TABLET | ORAL | 1 refills | Status: DC

## 2017-11-08 MED ORDER — ATORVASTATIN CALCIUM 20 MG PO TABS: 20.0000 mg | ORAL_TABLET | Freq: Every day | ORAL | 3 refills | Status: DC

## 2017-11-08 MED ORDER — AMLODIPINE BESYLATE 10 MG PO TABS: 10.0000 mg | ORAL_TABLET | Freq: Every day | ORAL | 1 refills | Status: DC

## 2017-11-08 MED ORDER — PENICILLIN G BENZATHINE 2400000 UNIT/4ML IM SUSP
2.4000 10*6.[IU] | Freq: Once | INTRAMUSCULAR | Status: AC
  Administered 2017-11-08: 2.4 10*6.[IU] via INTRAMUSCULAR

## 2017-11-08 NOTE — Progress Notes (Signed)
Meds ordered this encounter  Medications  . amLODipine (NORVASC) 10 MG tablet    Sig: Take 1 tablet (10 mg total) by mouth daily. for high blood pressure    Dispense:  90 tablet    Refill:  1  . carvedilol (COREG) 3.125 MG tablet    Sig: TAKE 1 TABLET BY MOUTH DAILY WITH SUPPER FOR HIGH BLOOD PRESSURE    Dispense:  90 tablet    Refill:  [place]  MSN, FNP-C [facility] [address] [place], [place] [address] [phone]

## 2017-11-08 NOTE — Progress Notes (Signed)
Stacie Daugherty, a 57 year old female was evaluated for possible exposure to STDs on 11/06/2017, RPR was positive and treponemal test confirmed positive for syphilis. Will order the following treatment regimen:   Meds ordered this encounter  Medications  . Penicillin G Benzathine SUSP 2.4 Million Units     Donia Pounds  MSN, FNP-C [facility] [address] [place], [place] [address] [phone]

## 2017-11-14 ENCOUNTER — Other Ambulatory Visit: Payer: Self-pay | Admitting: Family Medicine

## 2017-11-14 DIAGNOSIS — I10 Essential (primary) hypertension: Secondary | ICD-10-CM

## 2017-11-20 ENCOUNTER — Telehealth: Payer: Self-pay

## 2018-01-09 ENCOUNTER — Encounter: Payer: Self-pay | Admitting: Sports Medicine

## 2018-01-09 ENCOUNTER — Other Ambulatory Visit: Payer: Self-pay | Admitting: Sports Medicine

## 2018-01-09 ENCOUNTER — Ambulatory Visit (INDEPENDENT_AMBULATORY_CARE_PROVIDER_SITE_OTHER): Payer: Medicaid Other

## 2018-01-09 ENCOUNTER — Ambulatory Visit: Payer: Medicaid Other | Admitting: Sports Medicine

## 2018-01-09 DIAGNOSIS — M722 Plantar fascial fibromatosis: Secondary | ICD-10-CM

## 2018-01-09 DIAGNOSIS — M79671 Pain in right foot: Secondary | ICD-10-CM

## 2018-01-09 DIAGNOSIS — M79672 Pain in left foot: Secondary | ICD-10-CM

## 2018-01-09 MED ORDER — METHYLPREDNISOLONE 4 MG PO TBPK
ORAL_TABLET | ORAL | 0 refills | Status: DC
Start: 2018-01-09 — End: 2018-05-07

## 2018-01-09 MED ORDER — MELOXICAM 15 MG PO TABS: 15.0000 mg | ORAL_TABLET | Freq: Every day | ORAL | 0 refills | Status: DC

## 2018-01-09 NOTE — Patient Instructions (Signed)

## 2018-01-09 NOTE — Progress Notes (Signed)
Subjective: Stacie Daugherty is a 57 y.o. female patient presents to office with complaint of moderate heel pain on the left and right. Patient admits to post static dyskinesia for several month in duration with a past history of flare ups over the last 4 years. Patient has treated this problem with OTC insoles and stretching with no relief. Pain 8/10 in the AM. Denies any other pedal complaints.   Patient Active Problem List   Diagnosis Date Noted  . Unilateral primary osteoarthritis, left knee 09/13/2016  . Encounter for gynecological examination without abnormal finding 07/27/2016  . Sprain of medial collateral ligament of left knee 06/07/2016  . Acute pain of left knee 06/02/2016  . Bipolar disorder, curr episode mixed, severe, with psychotic features ([place]) 08/26/2015  . History of posttraumatic stress disorder (PTSD) 08/26/2015  . Essential hypertension 08/26/2015  . History of eczema 04/08/2015  . Pedal edema 08/06/2014  . Positive ANA (antinuclear antibody) 12/19/2012  . Biological false positive RPR test 08/18/2012  . HYPERCHOLESTEROLEMIA 04/28/2009  . HIDRADENITIS SUPPURATIVA 09/06/2007  . OBESITY 04/17/2007    Current Outpatient Medications on File Prior to Visit  Medication Sig Dispense Refill  . amLODipine (NORVASC) 10 MG tablet Take 1 tablet (10 mg total) by mouth daily. for high blood pressure 90 tablet 1  . amLODipine (NORVASC) 10 MG tablet TAKE 1 TABLET BY MOUTH DAILY FOR HIGH BLOOD PRESSURE 90 tablet 1  . ARIPiprazole 400 MG SUSR Inject 400 mg into the muscle every 28 (twenty-eight) days. (Due to be given on 10-05-15): For mood control 1 each 0  . atorvastatin (LIPITOR) 20 MG tablet Take 1 tablet (20 mg total) by mouth daily. 90 tablet 3  . carbamazepine (TEGRETOL) 200 MG tablet Take 1 tablet (200 mg total) by mouth 2 (two) times daily at 8 am and 10 pm. 60 tablet 0  . carvedilol (COREG) 3.125 MG tablet TAKE 1 TABLET BY MOUTH DAILY WITH SUPPER FOR HIGH BLOOD PRESSURE 90  tablet 1  . carvedilol (COREG) 3.125 MG tablet TAKE 1 TABLET BY MOUTH DAILY WITH SUPPER FOR HIGH BLOOD PRESSURE 90 tablet 1  . Cholecalciferol (VITAMIN D PO) Take 1 capsule by mouth daily.    . divalproex (DEPAKOTE) 250 MG DR tablet Take 1 tablet (250 mg) in AM & 500 mg at PM & 500 mg at bedtime: For mood stabilization 150 tablet 0  . gabapentin (NEURONTIN) 300 MG capsule Take 1 capsule (300 mg total) by mouth 3 (three) times daily at 8am, 2pm and bedtime. For agitation 90 capsule 0  . MELATONIN PO Take 1 tablet by mouth at bedtime.    . meloxicam (MOBIC) 7.5 MG tablet Take 1 tablet (7.5 mg total) by mouth 2 (two) times daily as needed for pain. (Patient not taking: Reported on 11/06/2017) 30 tablet 2  . naproxen (NAPROSYN) 500 MG tablet Take 1 tablet (500 mg total) by mouth 2 (two) times daily with a meal. 30 tablet 3  . NONFORMULARY OR COMPOUNDED ITEM Nitroglycerin 0.125% ointment- apply pea size amount to anal canal three times daily as needed for anal fissure therapy Dispense 30 grams, no refills (Patient not taking: Reported on 11/06/2017) 30 each 0  . triamcinolone ointment (KENALOG) 0.5 % Apply 1 application topically 2 (two) times daily. 30 g 0   Current Facility-Administered Medications on File Prior to Visit  Medication Dose Route Frequency Provider Last Rate Last Dose  . 0.9 %  sodium chloride infusion  500 mL Intravenous Continuous Armbruster, Carlota Raspberry, MD  Allergies  Allergen Reactions  . Haldol [Haloperidol Lactate] Other (See Comments)    Pt  Just doesn't like because she cant remember anything the next day.     Objective: Physical Exam General: The patient is alert and oriented x3 in no acute distress.  Dermatology: Skin is warm, dry and supple bilateral lower extremities. Nails 1-10 are normal. There is no erythema, edema, no eccymosis, no open lesions present. Integument is otherwise unremarkable.  Vascular: Dorsalis Pedis pulse and Posterior Tibial pulse are 2/4  bilateral. Capillary fill time is immediate to all digits.  Neurological: Grossly intact to light touch with an achilles reflex of +2/5 and a  negative Tinel's sign bilateral.  Musculoskeletal: Tenderness to palpation at the medial calcaneal tubercale and through the insertion of the plantar fascia on the left and right foot. No pain with compression of calcaneus bilateral. No pain with tuning fork to calcaneus bilateral. No pain with calf compression bilateral. There is decreased Ankle joint range of motion bilateral. All other joints range of motion within normal limits bilateral. + Pes planus foot type bilateral. Strength 5/5 in all groups bilateral.   Gait: Unassisted, mildly antalgic   Xray, Right/Left foot:  Normal osseous mineralization. Joint spaces preserved except at midtarsal joint where there is breach supportive of pes planus. No fracture/dislocation/boney destruction. Calcaneal spur present with mild thickening of plantar fascia. No other soft tissue abnormalities or radiopaque foreign bodies.   Assessment and Plan: Problem List Items Addressed This Visit    None    Visit Diagnoses    Heel pain, bilateral    -  Primary   Relevant Orders   DG Foot Complete Right   DG Foot Complete Left   Bilateral plantar fasciitis          -Complete examination performed.  -Xrays reviewed -Discussed with patient in detail the condition of plantar fasciitis, how this occurs and general treatment options. Explained both conservative and surgical treatments.  -After oral consent and aseptic prep, injected a mixture containing 1 ml of 2%  plain lidocaine, 1 ml 0.5% plain marcaine, 0.5 ml of kenalog 10 and 0.5 ml of dexamethasone phosphate into left and right heel. Post-injection care discussed with patient.  -Rx Meloxicam to start after Medrol dose pack is completed -Recommended good supportive shoes and advised use of inserts  -Recommend patient to ice affected area 1-2x daily. -Patient to  return to office as needed for follow up or sooner if problems or questions arise.  Landis Martins, DPM

## 2018-03-14 ENCOUNTER — Other Ambulatory Visit: Payer: Self-pay | Admitting: Family Medicine

## 2018-03-14 DIAGNOSIS — E7849 Other hyperlipidemia: Secondary | ICD-10-CM

## 2018-04-12 DIAGNOSIS — F314 Bipolar disorder, current episode depressed, severe, without psychotic features: Secondary | ICD-10-CM | POA: Diagnosis not present

## 2018-05-07 ENCOUNTER — Encounter: Payer: Self-pay | Admitting: Family Medicine

## 2018-05-07 ENCOUNTER — Ambulatory Visit (INDEPENDENT_AMBULATORY_CARE_PROVIDER_SITE_OTHER): Payer: Medicare Other | Admitting: Family Medicine

## 2018-05-07 VITALS — BP 143/86 | HR 69 | Temp 98.0°F | Resp 16 | Ht 63.0 in | Wt 263.0 lb

## 2018-05-07 DIAGNOSIS — R768 Other specified abnormal immunological findings in serum: Secondary | ICD-10-CM | POA: Diagnosis not present

## 2018-05-07 DIAGNOSIS — E559 Vitamin D deficiency, unspecified: Secondary | ICD-10-CM | POA: Diagnosis not present

## 2018-05-07 DIAGNOSIS — I10 Essential (primary) hypertension: Secondary | ICD-10-CM

## 2018-05-07 DIAGNOSIS — Z113 Encounter for screening for infections with a predominantly sexual mode of transmission: Secondary | ICD-10-CM | POA: Diagnosis not present

## 2018-05-07 LAB — POCT URINALYSIS DIPSTICK
Bilirubin, UA: NEGATIVE
Blood, UA: NEGATIVE
Glucose, UA: NEGATIVE
Ketones, UA: NEGATIVE
Leukocytes, UA: NEGATIVE
Nitrite, UA: NEGATIVE
Protein, UA: NEGATIVE
Spec Grav, UA: 1.015 (ref 1.010–1.025)
Urobilinogen, UA: 0.2 E.U./dL
pH, UA: 6 (ref 5.0–8.0)

## 2018-05-07 NOTE — Patient Instructions (Signed)
Syphilis Test Why am I having this test? Syphilis is an infectious disease caused by a type of bacteria called Treponema pallidum. It is most commonly spread through sexual contact. Syphilis may also spread to a fetus through the blood of the mother. You may have this test if you have symptoms of syphilis, such as a painless sore (chancre). Syphilis symptoms resemble the symptoms of many other conditions. Early symptoms may go away before new symptoms develop as the disease progresses. Untreated syphilis can lead to severe complications. Ultimately, the bacteria can damage the heart, brain, and nervous system. Pregnant women often have a syphilis test. You may also have this test if you are at risk of the disease because of:  Having a sexual partner with syphilis.  Engaging in high-risk sexual activity.  Having another sexually transmitted infection, such as gonorrhea.  What kind of sample is taken? A blood test is the most common way to test for syphilis. This requires taking a blood sample from a vein in your hand or arm. The blood test checks for antibodies to the bacteria that cause syphilis. Antibodies are proteins your body makes in response to germs and other things that can make you sick. There are two types of blood tests to check for syphilis. Both tests are necessary to make a definite diagnosis.  Nontreponemal test. ? This is usually the first test. ? This test can also detect antibodies to other proteins and may give a positive result for syphilis even if you do not have the condition (false positive).  Treponemal test. ? This test is done if you get a positive result to the nontreponemal test. ? It tests specifically for antibodies to syphilis. ? Other conditions are not likely to cause a positive result. ? This test will not reveal whether antibodies are from a previous syphilis infection or a recent one.  If your health care provider suspects late-stage syphilis, you may  have a spinal tap (lumbar puncture). This is a procedure in which a small amount of the fluid that surrounds the brain and spinal cord (cerebrospinal fluid or CSF) is removed and examined for syphilis. How do I prepare for this test? There is no preparation required for this test. What do the results mean? Your test results will be reported as either negative or positive. If the result of your syphilis test is negative, no antibodies were present at the time of the test. This could mean:  You do not have syphilis.  The antibodies have not formed yet. This can take several weeks. If it is possible that you were recently exposed to the bacteria, you may need to have the test again at a later time.  If you test positive for syphilis on the first nontreponemal test, you will most likely have the second treponemal test to confirm the diagnosis. If the result of the second test is also positive, it is likely that you have syphilis. If the result of the second test is negative, further testing may be needed to make sure you do not have syphilis. Talk to your health care provider to discuss your results, treatment options, and if necessary, the need for more tests. It is your responsibility to obtain your test results. Ask the lab or department performing the test when and how you will get your results. Talk with your health care provider if you have any questions about your results. Talk with your health care provider to discuss your results, treatment options, and if  necessary, the need for more tests. Talk with your health care provider if you have any questions about your results. This information is not intended to replace advice given to you by your health care provider. Make sure you discuss any questions you have with your health care provider. Document Released: 06/18/2004 Document Revised: 01/20/2016 Document Reviewed: 09/09/2013 Elsevier Interactive Patient Education  [employer].

## 2018-05-07 NOTE — Progress Notes (Signed)
  Patient [name]   Progress Note: General Provider: Lanae Boast, FNP  SUBJECTIVE:   Stacie Daugherty is a 57 y.o. female who  has a past medical history of Allergy, Anxiety, Arthritis, Bipolar 1 disorder ([place]), Depression, Hidradenitis suppurativa, and Hypertension.. Patient presents today for Hypertension and Follow-up (on std dignosis ) Patient states that she was diagnosed with syphillis due to a positive RPR. Patient given bicillin injection and instructed to return for repeat testing. Patient with a false positive RPR in 2014.  Review of Systems  Constitutional: Negative.   HENT: Negative.   Eyes: Negative.   Respiratory: Negative.   Cardiovascular: Negative.   Gastrointestinal: Negative.   Genitourinary: Negative.   Musculoskeletal: Negative.   Skin: Negative.   Neurological: Negative.   Psychiatric/Behavioral: Negative.      OBJECTIVE: BP (!) 143/86 (BP Location: Left Arm, Patient Position: Sitting, Cuff Size: Large)   Pulse 69   Temp 98 F (36.7 C) (Oral)   Resp 16   Ht 5\' 3"  (1.6 m)   Wt 263 lb (119.3 kg)   SpO2 100%   BMI 46.59 kg/m   Wt Readings from Last 3 Encounters:  05/07/18 263 lb (119.3 kg)  11/06/17 259 lb (117.5 kg)  02/27/17 258 lb (117 kg)     Physical Exam  Constitutional: She is oriented to person, place, and time. She appears well-developed and well-nourished. No distress.  HENT:  Head: Normocephalic and atraumatic.  Eyes: Pupils are equal, round, and reactive to light. Conjunctivae and EOM are normal.  Neck: Normal range of motion.  Cardiovascular: Normal rate, regular rhythm, normal heart sounds and intact distal pulses.  Pulmonary/Chest: Effort normal and breath sounds normal. No respiratory distress.  Musculoskeletal: Normal range of motion.  Neurological: She is alert and oriented to person, place, and time.  Skin: Skin is warm and dry.  Psychiatric: She has a normal mood and affect. Her  behavior is normal. Judgment and thought content normal.  Nursing note and vitals reviewed.   ASSESSMENT/PLAN:  1. Biological false positive RPR test Patient with history of false positive RPR. FTA on 11/06/2017 negative.  Patient does not have active syphilis. Discussed that certain medications and illnesses can cause false positives.  - RPR  2. Essential hypertension Continue with current medications - Urinalysis Dipstick  3. Vitamin D deficiency Hx of vitamin D deficiency - VITAMIN D 25 Hydroxy (Vit-D Deficiency, Fractures)  4. Screening for STD (sexually transmitted disease) - Chlamydia/GC NAA, Confirmation         The patient was given clear instructions to go to ER or return to medical center if symptoms do not improve, worsen or new problems develop. The patient verbalized understanding and agreed with plan of care.   [name]. Nathaneil Canary, FNP-BC [facility] [address] [place], [place] [address] [phone]     This note has been created with Dragon speech recognition software and smart phrase technology. Any transcriptional errors are unintentional.

## 2018-05-08 ENCOUNTER — Other Ambulatory Visit: Payer: Self-pay

## 2018-05-08 DIAGNOSIS — E7849 Other hyperlipidemia: Secondary | ICD-10-CM

## 2018-05-08 DIAGNOSIS — I10 Essential (primary) hypertension: Secondary | ICD-10-CM

## 2018-05-08 MED ORDER — AMLODIPINE BESYLATE 10 MG PO TABS: 10.0000 mg | ORAL_TABLET | Freq: Every day | ORAL | 1 refills | Status: DC

## 2018-05-08 MED ORDER — ATORVASTATIN CALCIUM 20 MG PO TABS: 20.0000 mg | ORAL_TABLET | Freq: Every day | ORAL | 3 refills | Status: DC

## 2018-05-08 MED ORDER — CARVEDILOL 3.125 MG PO TABS: ORAL_TABLET | ORAL | 1 refills | Status: DC

## 2018-05-09 LAB — RPR: RPR Ser Ql: REACTIVE — AB

## 2018-05-09 LAB — CHLAMYDIA/GC NAA, CONFIRMATION
Chlamydia trachomatis, NAA: NEGATIVE
Neisseria gonorrhoeae, NAA: NEGATIVE

## 2018-05-09 LAB — RPR, QUANT+TP ABS (REFLEX)
Rapid Plasma Reagin, Quant: 1:1 {titer} — ABNORMAL HIGH
T Pallidum Abs: NONREACTIVE

## 2018-05-09 LAB — VITAMIN D 25 HYDROXY (VIT D DEFICIENCY, FRACTURES): Vit D, 25-Hydroxy: 21.1 ng/mL — ABNORMAL LOW (ref 30.0–100.0)

## 2018-05-10 DIAGNOSIS — F314 Bipolar disorder, current episode depressed, severe, without psychotic features: Secondary | ICD-10-CM | POA: Diagnosis not present

## 2018-05-17 ENCOUNTER — Other Ambulatory Visit: Payer: Self-pay

## 2018-05-17 DIAGNOSIS — E559 Vitamin D deficiency, unspecified: Secondary | ICD-10-CM

## 2018-05-17 MED ORDER — VITAMIN D (ERGOCALCIFEROL) 1.25 MG (50000 UNIT) PO CAPS: 50000.0000 [IU] | ORAL_CAPSULE | ORAL | 0 refills | Status: DC

## 2018-05-17 MED ORDER — VITAMIN D (ERGOCALCIFEROL) 1.25 MG (50000 UNIT) PO CAPS: 50000.0000 [IU] | ORAL_CAPSULE | ORAL | 0 refills | Status: AC

## 2018-05-17 NOTE — Addendum Note (Signed)
Addended by: Genelle Bal on: 05/17/2018 12:59 PM   Modules accepted: Orders

## 2018-06-11 DIAGNOSIS — F314 Bipolar disorder, current episode depressed, severe, without psychotic features: Secondary | ICD-10-CM | POA: Diagnosis not present

## 2018-07-05 ENCOUNTER — Other Ambulatory Visit: Payer: Self-pay | Admitting: Family Medicine

## 2018-07-05 DIAGNOSIS — N644 Mastodynia: Secondary | ICD-10-CM

## 2018-07-09 ENCOUNTER — Other Ambulatory Visit: Payer: Self-pay | Admitting: Family Medicine

## 2018-07-09 ENCOUNTER — Encounter: Payer: Self-pay | Admitting: Family Medicine

## 2018-07-09 ENCOUNTER — Ambulatory Visit (INDEPENDENT_AMBULATORY_CARE_PROVIDER_SITE_OTHER): Payer: Medicare Other | Admitting: Family Medicine

## 2018-07-09 VITALS — BP 138/77 | HR 66 | Temp 98.2°F | Resp 16 | Ht 63.0 in | Wt 262.0 lb

## 2018-07-09 DIAGNOSIS — N644 Mastodynia: Secondary | ICD-10-CM

## 2018-07-09 DIAGNOSIS — L02411 Cutaneous abscess of right axilla: Secondary | ICD-10-CM | POA: Diagnosis not present

## 2018-07-09 MED ORDER — CEPHALEXIN 500 MG PO CAPS: 500.0000 mg | ORAL_CAPSULE | Freq: Two times a day (BID) | ORAL | 0 refills | Status: AC

## 2018-07-09 NOTE — Patient Instructions (Signed)
I sent in a prescription for antibiotics (Keflex) for the area under the right under arm. Keep the area clean and dry. Cover with topical antibiotic ointment such as neosporin.   Skin Abscess  A skin abscess is an infected area of your skin that contains pus and other material. An abscess can happen in any part of your body. Some abscesses break open (rupture) on their own. Most continue to get worse unless they are treated. The infection can spread deeper into the body and into your blood, which can make you feel sick. A skin abscess is caused by germs that enter the skin through a cut or scrape. It can also be caused by blocked oil and sweat glands or infected hair follicles. This condition is usually treated by:  Draining the pus.  Taking antibiotic medicines.  Placing a warm, wet washcloth over the abscess. Follow these instructions at home: Medicines   Take over-the-counter and prescription medicines only as told by your doctor.  If you were prescribed an antibiotic medicine, take it as told by your doctor. Do not stop taking the antibiotic even if you start to feel better. Abscess care   If you have an abscess that has not drained, place a warm, clean, wet washcloth over the abscess several times a day. Do this as told by your doctor.  Follow instructions from your doctor about how to take care of your abscess. Make sure you: ? Cover the abscess with a bandage (dressing). ? Change your bandage or gauze as told by your doctor. ? Wash your hands with soap and water before you change the bandage or gauze. If you cannot use soap and water, use hand sanitizer.  Check your abscess every day for signs that the infection is getting worse. Check for: ? More redness, swelling, or pain. ? More fluid or blood. ? Warmth. ? More pus or a bad smell. General instructions  To avoid spreading the infection: ? Do not share personal care items, towels, or hot tubs with others. ? Avoid making  skin-to-skin contact with other people.  Keep all follow-up visits as told by your doctor. This is important. Contact a doctor if:  You have more redness, swelling, or pain around your abscess.  You have more fluid or blood coming from your abscess.  Your abscess feels warm when you touch it.  You have more pus or a bad smell coming from your abscess.  You have a fever.  Your muscles ache.  You have chills.  You feel sick. Get help right away if:  You have very bad (severe) pain.  You see red streaks on your skin spreading away from the abscess. Summary  A skin abscess is an infected area of your skin that contains pus and other material.  The abscess is caused by germs that enter the skin through a cut or scrape. It can also be caused by blocked oil and sweat glands or infected hair follicles.  Follow your doctor's instructions on caring for your abscess, taking medicines, preventing infections, and keeping follow-up visits. This information is not intended to replace advice given to you by your health care provider. Make sure you discuss any questions you have with your health care provider. Document Released: 11/02/2007 Document Revised: 06/29/2017 Document Reviewed: 06/29/2017 Elsevier Interactive Patient Education  2019 [place]  A mammogram is an X-ray of the breasts that is done to check for changes that are not normal. This test can screen for and  find any changes that may suggest breast cancer. This test can also help to find other changes and variations in the breast. What happens before the procedure?  Have this test done about 1-2 weeks after your period. This is usually when your breasts are the least tender.  If you are visiting a new doctor or clinic, send any past mammogram images to your new doctor's office.  Wash your breasts and under your arms the day of the test.  Do not use deodorants, perfumes, lotions, or powders on the day of the  test.  Take off any jewelry from your neck.  Wear clothes that you can change into and out of easily. What happens during the procedure?  You will undress from the waist up. You will put on a gown.  You will stand in front of the X-ray machine.  Each breast will be placed between two plastic or glass plates. The plates will press down on your breast for a few seconds. Try to stay as relaxed as possible. This does not cause any harm to your breasts. Any discomfort you feel will be very brief.  X-rays will be taken from different angles of each breast. The procedure may vary among doctors and hospitals. What happens after the procedure?  The mammogram will be looked at by a specialist (radiologist).  You may need to do certain parts of the test again. This depends on the quality of the images.  Ask when your test results will be ready. Make sure you get your test results.  You may go back to your normal activities. This information is not intended to replace advice given to you by your health care provider. Make sure you discuss any questions you have with your health care provider. Document Released: 08/12/2008 Document Revised: 12/29/2016 Document Reviewed: 07/25/2014 Elsevier Interactive Patient Education  2019 [employer].

## 2018-07-09 NOTE — Progress Notes (Signed)
[facility]   Progress Note: Sick Visit Provider: Lanae Boast, FNP  SUBJECTIVE:   Stacie Daugherty is a 58 y.o. female who  has a past medical history of Allergy, Anxiety, Arthritis, Bipolar 1 disorder ([place]), Depression, Hidradenitis suppurativa, and Hypertension.. Patient presents today for Breast Pain (right breast pain x 1 month ) and Recurrent Skin Infections (boil under right arm ) Patient presents with an acute visit today.  She states that she is having right breast pain x1 month that occurs intermittently.  She denies any precipitating factors.  She also has a history of abscess under the right axilla.  She states that the area will not heal completely.  The area has recently started to have exudate.  She denies fevers chills or night sweats.  Patient is due for mammogram.  She called to schedule an appointment for mammogram but due to right breast pain she was advised to see her primary care provider. Review of Systems  Constitutional: Negative.   HENT: Negative.   Eyes: Negative.   Respiratory: Negative.   Cardiovascular: Negative.   Gastrointestinal: Negative.   Genitourinary: Negative.   Musculoskeletal: Negative.   Skin: Positive for rash.       Patient with abscess under the right axilla that has been present for several months.  Neurological: Negative.   Psychiatric/Behavioral: Negative.      OBJECTIVE: BP 138/77 (BP Location: Left Arm, Patient Position: Sitting, Cuff Size: Large)   Pulse 66   Temp 98.2 F (36.8 C) (Oral)   Resp 16   Ht 5\' 3"  (1.6 m)   Wt 262 lb (118.8 kg)   SpO2 98%   BMI 46.41 kg/m   Wt Readings from Last 3 Encounters:  07/09/18 262 lb (118.8 kg)  05/07/18 263 lb (119.3 kg)  11/06/17 259 lb (117.5 kg)     Physical Exam Constitutional:      General: She is not in acute distress.    Appearance: Normal appearance. She is obese.  HENT:     Head: Normocephalic and atraumatic.    Cardiovascular:     Rate and Rhythm: Normal rate.     Pulses: Normal pulses.     Heart sounds: Normal heart sounds.  Pulmonary:     Effort: Pulmonary effort is normal.     Breath sounds: Normal breath sounds.  Chest:     Breasts: Breasts are symmetrical.        Right: Skin change (Darkening of skin on the right outer upper quadrant) and tenderness (Tenderness noted to the right outer lower quadrant) present. No mass or nipple discharge.      Comments: Dense breast tissue noted bilaterally with right greater than the left. Musculoskeletal: Normal range of motion.  Skin:    Comments: There is an open area that is macerated noted to the right axilla with exudate expressed.  Exudate is clear with mild bloody tinge.  Neurological:     Mental Status: She is alert and oriented to person, place, and time.  Psychiatric:        Mood and Affect: Mood normal.        Behavior: Behavior normal.        Thought Content: Thought content normal.        Judgment: Judgment normal.     ASSESSMENT/PLAN:  1. Mastalgia Diagnostic mammogram ordered. - MM Digital Diagnostic Bilat; Future  2. Abscess of axilla, right Advised patient to cover the area with over-the-counter triple antibiotic  ointment such as Neosporin.  She is to take the Keflex as prescribed. - cephALEXin (KEFLEX) 500 MG capsule; Take 1 capsule (500 mg total) by mouth 2 (two) times daily for 7 days.  Dispense: 14 capsule; Refill: 0        The patient was given clear instructions to go to ER or return to medical center if symptoms do not improve, worsen or new problems develop. The patient verbalized understanding and agreed with plan of care.   [name]. Nathaneil Canary, FNP-BC [facility] [address] [place], [place] [address] [phone]     This note has been created with Dragon speech recognition software and smart phrase technology. Any transcriptional errors are unintentional.

## 2018-07-11 ENCOUNTER — Ambulatory Visit: Payer: Self-pay

## 2018-07-11 ENCOUNTER — Ambulatory Visit
Admission: RE | Admit: 2018-07-11 | Discharge: 2018-07-11 | Disposition: A | Discharge status: Home / Self Care | Payer: Medicare Other | Source: Ambulatory Visit | Attending: Family Medicine | Admitting: Family Medicine

## 2018-07-11 DIAGNOSIS — N644 Mastodynia: Secondary | ICD-10-CM

## 2018-07-11 DIAGNOSIS — R928 Other abnormal and inconclusive findings on diagnostic imaging of breast: Secondary | ICD-10-CM | POA: Diagnosis not present

## 2018-07-11 DIAGNOSIS — F314 Bipolar disorder, current episode depressed, severe, without psychotic features: Secondary | ICD-10-CM | POA: Diagnosis not present

## 2018-07-13 ENCOUNTER — Other Ambulatory Visit: Payer: Self-pay

## 2018-07-19 ENCOUNTER — Other Ambulatory Visit: Payer: Self-pay

## 2018-07-30 ENCOUNTER — Other Ambulatory Visit: Payer: Self-pay | Admitting: Family Medicine

## 2018-07-30 DIAGNOSIS — Z1231 Encounter for screening mammogram for malignant neoplasm of breast: Secondary | ICD-10-CM

## 2018-08-08 DIAGNOSIS — F314 Bipolar disorder, current episode depressed, severe, without psychotic features: Secondary | ICD-10-CM | POA: Diagnosis not present

## 2018-08-24 ENCOUNTER — Ambulatory Visit: Payer: Self-pay

## 2018-09-12 DIAGNOSIS — F314 Bipolar disorder, current episode depressed, severe, without psychotic features: Secondary | ICD-10-CM | POA: Diagnosis not present

## 2018-09-27 ENCOUNTER — Ambulatory Visit: Payer: Self-pay

## 2018-10-02 ENCOUNTER — Other Ambulatory Visit: Payer: Self-pay | Admitting: Family Medicine

## 2018-10-02 DIAGNOSIS — E559 Vitamin D deficiency, unspecified: Secondary | ICD-10-CM

## 2018-10-04 NOTE — Telephone Encounter (Signed)
Message sent to provider 

## 2018-10-17 DIAGNOSIS — H11132 Conjunctival pigmentations, left eye: Secondary | ICD-10-CM | POA: Diagnosis not present

## 2018-10-17 DIAGNOSIS — H35033 Hypertensive retinopathy, bilateral: Secondary | ICD-10-CM | POA: Diagnosis not present

## 2018-10-17 DIAGNOSIS — H0102B Squamous blepharitis left eye, upper and lower eyelids: Secondary | ICD-10-CM | POA: Diagnosis not present

## 2018-10-17 DIAGNOSIS — H0288B Meibomian gland dysfunction left eye, upper and lower eyelids: Secondary | ICD-10-CM | POA: Diagnosis not present

## 2018-10-17 DIAGNOSIS — H0288A Meibomian gland dysfunction right eye, upper and lower eyelids: Secondary | ICD-10-CM | POA: Diagnosis not present

## 2018-10-17 DIAGNOSIS — H0102A Squamous blepharitis right eye, upper and lower eyelids: Secondary | ICD-10-CM | POA: Diagnosis not present

## 2018-10-18 DIAGNOSIS — F314 Bipolar disorder, current episode depressed, severe, without psychotic features: Secondary | ICD-10-CM | POA: Diagnosis not present

## 2018-11-06 ENCOUNTER — Ambulatory Visit: Payer: Self-pay

## 2018-11-07 ENCOUNTER — Ambulatory Visit: Payer: Self-pay | Admitting: Family Medicine

## 2018-11-22 ENCOUNTER — Ambulatory Visit
Admission: RE | Admit: 2018-11-22 | Discharge: 2018-11-22 | Disposition: A | Discharge status: Home / Self Care | Payer: Medicare Other | Source: Ambulatory Visit | Attending: Family Medicine | Admitting: Family Medicine

## 2018-11-22 ENCOUNTER — Other Ambulatory Visit: Payer: Self-pay

## 2018-11-22 DIAGNOSIS — Z1231 Encounter for screening mammogram for malignant neoplasm of breast: Secondary | ICD-10-CM

## 2018-11-23 ENCOUNTER — Telehealth: Payer: Self-pay

## 2018-11-23 NOTE — Telephone Encounter (Signed)
Called to do COVID Screening for appointment tomorrow. No answer. Left a message to call back. Thanks! 

## 2018-11-26 ENCOUNTER — Ambulatory Visit
Admission: RE | Admit: 2018-11-26 | Discharge: 2018-11-26 | Disposition: A | Discharge status: Home / Self Care | Payer: Medicare Other | Source: Ambulatory Visit | Attending: Family Medicine | Admitting: Family Medicine

## 2018-11-26 ENCOUNTER — Other Ambulatory Visit: Payer: Self-pay

## 2018-11-26 ENCOUNTER — Encounter: Payer: Self-pay | Admitting: Family Medicine

## 2018-11-26 ENCOUNTER — Ambulatory Visit (INDEPENDENT_AMBULATORY_CARE_PROVIDER_SITE_OTHER): Payer: Medicare Other | Admitting: Family Medicine

## 2018-11-26 ENCOUNTER — Other Ambulatory Visit: Payer: Self-pay | Admitting: Family Medicine

## 2018-11-26 VITALS — BP 132/57 | HR 81 | Temp 98.6°F | Resp 14 | Ht 63.0 in | Wt 270.0 lb

## 2018-11-26 DIAGNOSIS — M25561 Pain in right knee: Secondary | ICD-10-CM

## 2018-11-26 DIAGNOSIS — G8929 Other chronic pain: Secondary | ICD-10-CM

## 2018-11-26 DIAGNOSIS — S8991XA Unspecified injury of right lower leg, initial encounter: Secondary | ICD-10-CM | POA: Diagnosis not present

## 2018-11-26 DIAGNOSIS — E559 Vitamin D deficiency, unspecified: Secondary | ICD-10-CM

## 2018-11-26 DIAGNOSIS — E7849 Other hyperlipidemia: Secondary | ICD-10-CM | POA: Diagnosis not present

## 2018-11-26 DIAGNOSIS — Z6841 Body Mass Index (BMI) 40.0 and over, adult: Secondary | ICD-10-CM

## 2018-11-26 DIAGNOSIS — I10 Essential (primary) hypertension: Secondary | ICD-10-CM

## 2018-11-26 DIAGNOSIS — R921 Mammographic calcification found on diagnostic imaging of breast: Secondary | ICD-10-CM

## 2018-11-26 LAB — POCT URINALYSIS DIPSTICK
Bilirubin, UA: NEGATIVE
Blood, UA: NEGATIVE
Glucose, UA: NEGATIVE
Nitrite, UA: NEGATIVE
Protein, UA: NEGATIVE
Spec Grav, UA: 1.02 (ref 1.010–1.025)
Urobilinogen, UA: 0.2 E.U./dL
pH, UA: 6 (ref 5.0–8.0)

## 2018-11-26 MED ORDER — CARVEDILOL 3.125 MG PO TABS: 3.1250 mg | ORAL_TABLET | Freq: Every day | ORAL | 1 refills | Status: DC

## 2018-11-26 MED ORDER — AMLODIPINE BESYLATE 10 MG PO TABS: 10.0000 mg | ORAL_TABLET | Freq: Every day | ORAL | 1 refills | Status: DC

## 2018-11-26 MED ORDER — ATORVASTATIN CALCIUM 20 MG PO TABS: 20.0000 mg | ORAL_TABLET | Freq: Every day | ORAL | 3 refills | Status: DC

## 2018-11-26 NOTE — Progress Notes (Signed)
Please let the patient know that she has advanced arthritis in the right knee. If she would like a referral to ortho, please let me know.

## 2018-11-26 NOTE — Progress Notes (Signed)
Patient [facility]   Progress Note: General Provider: Lanae Boast, FNP  SUBJECTIVE:   Stacie Daugherty is a 58 y.o. female who  has a past medical history of Allergy, Anxiety, Arthritis, Bipolar 1 disorder ([place]), Depression, Hidradenitis suppurativa, and Hypertension.. Patient presents today for Follow-up (6 month follow up), Leg Pain (right leg pain/injury x 3 weeks ago ), and Hypertension Patient reports a history of chronic knee pain. She states that she was told that she needed to have surgery. Was reluctant and has not followed up. She reports hitting the right knee and hyperextending it three weeks ago. She has not used a knee brace, ice or analgesics to help with pain.  Patient reports compliance with all medications.  Patient denies side effects of medications.   She does not currenlty exercise or eat a carb/sodium modified diet.  Review of Systems  Constitutional: Negative.   HENT: Negative.   Eyes: Negative.   Respiratory: Negative.   Cardiovascular: Negative.   Gastrointestinal: Negative.   Genitourinary: Negative.   Musculoskeletal: Positive for joint pain (right knee).  Skin: Negative.   Neurological: Negative.   Psychiatric/Behavioral: Negative.      OBJECTIVE: BP (!) 132/57 (BP Location: Left Arm, Patient Position: Sitting, Cuff Size: Large)   Pulse 81   Temp 98.6 F (37 C) (Oral)   Resp 14   Ht 5\' 3"  (1.6 m)   Wt 270 lb (122.5 kg)   SpO2 99%   BMI 47.83 kg/m   Wt Readings from Last 3 Encounters:  11/26/18 270 lb (122.5 kg)  07/09/18 262 lb (118.8 kg)  05/07/18 263 lb (119.3 kg)     Physical Exam Vitals signs and nursing note reviewed.  Constitutional:      General: She is not in acute distress.    Appearance: She is well-developed.  HENT:     Head: Normocephalic and atraumatic.  Eyes:     Conjunctiva/sclera: Conjunctivae normal.     Pupils: Pupils are equal, round, and reactive to light.  Neck:   Musculoskeletal: Normal range of motion.  Cardiovascular:     Rate and Rhythm: Normal rate and regular rhythm.     Heart sounds: Normal heart sounds.  Pulmonary:     Effort: Pulmonary effort is normal. No respiratory distress.     Breath sounds: Normal breath sounds.  Abdominal:     General: Bowel sounds are normal. There is no distension.     Palpations: Abdomen is soft.  Musculoskeletal: Normal range of motion.     Right knee: Normal. She exhibits normal range of motion. No tenderness found.  Skin:    General: Skin is warm and dry.  Neurological:     Mental Status: She is alert and oriented to person, place, and time.  Psychiatric:        Behavior: Behavior normal.        Thought Content: Thought content normal.     ASSESSMENT/PLAN:  1. Essential hypertension - Urinalysis Dipstick - amLODipine (NORVASC) 10 MG tablet; Take 1 tablet (10 mg total) by mouth daily. for high blood pressure  Dispense: 90 tablet; Refill: 1 - carvedilol (COREG) 3.125 MG tablet; Take 1 tablet (3.125 mg total) by mouth daily.  Dispense: 90 tablet; Refill: 1 - Comprehensive metabolic panel; Future  2. Other hyperlipidemia   atorvastatin (LIPITOR) 20 MG tablet; Take 1 tablet (20 mg total) by mouth daily.  Dispense: 90 tablet; Refill: 3 - Lipid panel; Future  3. Class 3  severe obesity due to excess calories with body mass index (BMI) of 45.0 to 49.9 in adult, unspecified whether serious comorbidity present [facility]) The patient is asked to make an attempt to improve diet and exercise patterns to aid in medical management of this problem.  4. Chronic pain of right knee She declines ortho referral at the present time.  - DG Knee Complete 4 Views Right; Future  5. Vitamin D deficiency Labs ordered.  - VITAMIN D 25 Hydroxy (Vit-D Deficiency, Fractures); Future   Return in 6 months (on 05/28/2019).    The patient was given clear instructions to go to ER or return to medical center if symptoms do not  improve, worsen or new problems develop. The patient verbalized understanding and agreed with plan of care.   Stacie Daugherty. Nathaneil Canary, FNP-BC [facility] [address] [place], [place] [address] [phone]

## 2018-11-26 NOTE — Patient Instructions (Addendum)
Take tylenol 650 mg three times a day is the best evidence based medicine we have for arthritis.  Glucosamine sulfate 750mg  twice a day is a supplement that has been shown to help moderate to severe arthritis. Vitamin D 2000 IU daily Fish oil 2 grams daily.  Tumeric 500mg  twice daily.  Capsaicin topically up to four times a day may also help with pain. Make sure you apply ice to the area.    Hypertension, Adult Hypertension is another name for high blood pressure. High blood pressure forces your heart to work harder to pump blood. This can cause problems over time. There are two numbers in a blood pressure reading. There is a top number (systolic) over a bottom number (diastolic). It is best to have a blood pressure that is below 120/80. Healthy choices can help lower your blood pressure, or you may need medicine to help lower it. What are the causes? The cause of this condition is not known. Some conditions may be related to high blood pressure. What increases the risk?  Smoking.  Having type 2 diabetes mellitus, high cholesterol, or both.  Not getting enough exercise or physical activity.  Being overweight.  Having too much fat, sugar, calories, or salt (sodium) in your diet.  Drinking too much alcohol.  Having long-term (chronic) kidney disease.  Having a family history of high blood pressure.  Age. Risk increases with age.  Race. You may be at higher risk if you are African American.  Gender. Men are at higher risk than women before age 15. After age 39, women are at higher risk than men.  Having obstructive sleep apnea.  Stress. What are the signs or symptoms?  High blood pressure may not cause symptoms. Very high blood pressure (hypertensive crisis) may cause: ? Headache. ? Feelings of worry or nervousness (anxiety). ? Shortness of breath. ? Nosebleed. ? A feeling of being sick to your stomach (nausea). ? Throwing up (vomiting). ? Changes in how you  see. ? Very bad chest pain. ? Seizures. How is this treated?  This condition is treated by making healthy lifestyle changes, such as: ? Eating healthy foods. ? Exercising more. ? Drinking less alcohol.  Your health care provider may prescribe medicine if lifestyle changes are not enough to get your blood pressure under control, and if: ? Your top number is above 130. ? Your bottom number is above 80.  Your personal target blood pressure may vary. Follow these instructions at home: Eating and drinking   If told, follow the DASH eating plan. To follow this plan: ? Fill one half of your plate at each meal with fruits and vegetables. ? Fill one fourth of your plate at each meal with whole grains. Whole grains include whole-wheat pasta, brown rice, and whole-grain bread. ? Eat or drink low-fat dairy products, such as skim milk or low-fat yogurt. ? Fill one fourth of your plate at each meal with low-fat (lean) proteins. Low-fat proteins include fish, chicken without skin, eggs, beans, and tofu. ? Avoid fatty meat, cured and processed meat, or chicken with skin. ? Avoid pre-made or processed food.  Eat less than 1,500 mg of salt each day.  Do not drink alcohol if: ? Your doctor tells you not to drink. ? You are pregnant, may be pregnant, or are planning to become pregnant.  If you drink alcohol: ? Limit how much you use to:  0-1 drink a day for women.  0-2 drinks a day for men. ?  Be aware of how much alcohol is in your drink. In the [place] one drink equals one 12 oz bottle of beer (355 mL), one 5 oz glass of wine (148 mL), or one 1 oz glass of hard liquor (44 mL). Lifestyle   Work with your doctor to stay at a healthy weight or to lose weight. Ask your doctor what the best weight is for you.  Get at least 30 minutes of exercise most days of the week. This may include walking, swimming, or biking.  Get at least 30 minutes of exercise that strengthens your muscles (resistance  exercise) at least 3 days a week. This may include lifting weights or doing Pilates.  Do not use any products that contain nicotine or tobacco, such as cigarettes, e-cigarettes, and chewing tobacco. If you need help quitting, ask your doctor.  Check your blood pressure at home as told by your doctor.  Keep all follow-up visits as told by your doctor. This is important. Medicines  Take over-the-counter and prescription medicines only as told by your doctor. Follow directions carefully.  Do not skip doses of blood pressure medicine. The medicine does not work as well if you skip doses. Skipping doses also puts you at risk for problems.  Ask your doctor about side effects or reactions to medicines that you should watch for. Contact a doctor if you:  Think you are having a reaction to the medicine you are taking.  Have headaches that keep coming back (recurring).  Feel dizzy.  Have swelling in your ankles.  Have trouble with your vision. Get help right away if you:  Get a very bad headache.  Start to feel mixed up (confused).  Feel weak or numb.  Feel faint.  Have very bad pain in your: ? Chest. ? Belly (abdomen).  Throw up more than once.  Have trouble breathing. Summary  Hypertension is another name for high blood pressure.  High blood pressure forces your heart to work harder to pump blood.  For most people, a normal blood pressure is less than 120/80.  Making healthy choices can help lower blood pressure. If your blood pressure does not get lower with healthy choices, you may need to take medicine. This information is not intended to replace advice given to you by your health care provider. Make sure you discuss any questions you have with your health care provider. Document Released: 11/02/2007 Document Revised: 01/24/2018 Document Reviewed: 01/24/2018 Elsevier Patient Education  2020 [employer].

## 2018-11-27 ENCOUNTER — Telehealth: Payer: Self-pay

## 2018-11-27 NOTE — Telephone Encounter (Signed)
-----   Message from Lanae Boast, [place] sent at 11/26/2018  5:55 PM EDT ----- Please let the patient know that she has advanced arthritis in the right knee. If she would like a referral to ortho, please let me know.

## 2018-11-27 NOTE — Telephone Encounter (Signed)
Patient returned call and I advised of advanced arthritis in right knee. She does want to be referred to Ortho. Please put in referral. Thanks!

## 2018-11-27 NOTE — Telephone Encounter (Signed)
Called, no answer. Left a message to call back. Thanks!  

## 2018-11-28 ENCOUNTER — Other Ambulatory Visit: Payer: Self-pay | Admitting: Family Medicine

## 2018-11-28 ENCOUNTER — Other Ambulatory Visit: Payer: Self-pay | Admitting: Pediatric Intensive Care

## 2018-11-28 DIAGNOSIS — G8929 Other chronic pain: Secondary | ICD-10-CM

## 2018-11-28 DIAGNOSIS — Z20822 Contact with and (suspected) exposure to covid-19: Secondary | ICD-10-CM

## 2018-11-28 NOTE — Progress Notes (Signed)
Ortho referral placed

## 2018-11-28 NOTE — Telephone Encounter (Signed)
Referral placed. Has seen Dr. Erlinda Hong in the past.

## 2018-11-29 ENCOUNTER — Ambulatory Visit
Admission: RE | Admit: 2018-11-29 | Discharge: 2018-11-29 | Disposition: A | Discharge status: Home / Self Care | Payer: Medicare Other | Source: Ambulatory Visit | Attending: Family Medicine | Admitting: Family Medicine

## 2018-11-29 ENCOUNTER — Other Ambulatory Visit: Payer: Self-pay | Admitting: Family Medicine

## 2018-11-29 ENCOUNTER — Other Ambulatory Visit: Payer: Self-pay

## 2018-11-29 DIAGNOSIS — R921 Mammographic calcification found on diagnostic imaging of breast: Secondary | ICD-10-CM

## 2018-12-04 ENCOUNTER — Other Ambulatory Visit: Payer: Self-pay

## 2018-12-04 ENCOUNTER — Other Ambulatory Visit: Payer: Medicare Other

## 2018-12-04 ENCOUNTER — Encounter: Payer: Self-pay | Admitting: Orthopaedic Surgery

## 2018-12-04 ENCOUNTER — Ambulatory Visit (INDEPENDENT_AMBULATORY_CARE_PROVIDER_SITE_OTHER): Payer: Medicare Other | Admitting: Orthopaedic Surgery

## 2018-12-04 DIAGNOSIS — I10 Essential (primary) hypertension: Secondary | ICD-10-CM

## 2018-12-04 DIAGNOSIS — E7849 Other hyperlipidemia: Secondary | ICD-10-CM | POA: Diagnosis not present

## 2018-12-04 DIAGNOSIS — S76311A Strain of muscle, fascia and tendon of the posterior muscle group at thigh level, right thigh, initial encounter: Secondary | ICD-10-CM | POA: Diagnosis not present

## 2018-12-04 DIAGNOSIS — M25561 Pain in right knee: Secondary | ICD-10-CM

## 2018-12-04 DIAGNOSIS — E559 Vitamin D deficiency, unspecified: Secondary | ICD-10-CM

## 2018-12-04 DIAGNOSIS — G8929 Other chronic pain: Secondary | ICD-10-CM

## 2018-12-04 NOTE — Progress Notes (Signed)
Office Visit Note   Patient: Stacie Daugherty           Date of Birth: [birth year]           MRN: [id number] Visit Date: 12/04/2018              Requested by: Lanae Boast, [place],  [place] [address] PCP: Lanae Boast, FNP   Assessment & Plan: Visit Diagnoses:  1. Hamstring strain, right, initial encounter     Plan:  We will send patient to physical therapy for modalities to the knee particularly to the hamstring insertion.  Also for quad strengthening.  Discussed weight loss with patient.  May consider a cortisone injection in the knee if pain persists.  Questions were encouraged and answered on today.  She will ice the knee particularly the popliteal region of the knee 2-3 times a day for 15 to 20 minutes.  She avoid NSAIDs as she is on so many other medications at this time. Follow-Up Instructions: Return in about 4 weeks (around 01/01/2019).   Orders:  No orders of the defined types were placed in this encounter.  No orders of the defined types were placed in this encounter.     Procedures: No procedures performed   Clinical Data: No additional findings.   Subjective: Chief Complaint  Patient presents with  . Right Knee - Pain    HPI [name] is a 58 year old female comes in today due to right knee pain.  She reports 4 to 6 weeks ago she backed her leg on a piece of exercise equipment causing her to hyperextend her right knee.  She states she has had knee pain in the anterior aspect of the knee in the past but this is different pain in the back of her knee.  She had a treatment with rest denies any NSAIDs or ice.  She denies any mechanical symptoms in the knee.  Notes no bruising she feels that she may have had some swelling in the leg.  States her symptoms are unchanged since the incident continues to have daily knee pain.  Pain is worse at night when lying in bed.  Also getting up from a seated position she has  increased pain in the back of her knee .  Radiographs dated 11/26/2018 are reviewed and showed no acute fracture.  She has tricompartmental arthritis of right knee.  No evidence of dislocation.  Review of Systems Denies any fever chills.  Please see HPI otherwise negative noncontributory.  Objective: Vital Signs: There were no vitals taken for this visit.  Physical Exam General: Well-developed well-nourished female distress.  Psych alert and oriented x3 Ortho Exam Bilateral knees good range of motion.  She is slight tenderness along medial lateral joint line of both knees.  Maximal tenderness right knee in the insertion region of the semimembranosus .there is no bruising erythema skin laceration.  Bilateral knees without effusion.  Calves are supple nontender bilaterally.  Negative Thompson test bilaterally.  Specialty Comments:  No specialty comments available.  Imaging: No results found.   PMFS History: Patient Active Problem List   Diagnosis Date Noted  . Unilateral primary osteoarthritis, left knee 09/13/2016  . Encounter for gynecological examination without abnormal finding 07/27/2016  . Sprain of medial collateral ligament of left knee 06/07/2016  . Acute pain of left knee 06/02/2016  . Bipolar disorder, curr episode mixed, severe, with psychotic features ([place]) 08/26/2015  . History of posttraumatic stress  disorder (PTSD) 08/26/2015  . Essential hypertension 08/26/2015  . History of eczema 04/08/2015  . Pedal edema 08/06/2014  . Positive ANA (antinuclear antibody) 12/19/2012  . Biological false positive RPR test 08/18/2012  . HYPERCHOLESTEROLEMIA 04/28/2009  . HIDRADENITIS SUPPURATIVA 09/06/2007  . OBESITY 04/17/2007   Past Medical History:  Diagnosis Date  . Allergy    mild  . Anxiety    bipolar   . Arthritis    both knees  . Bipolar 1 disorder ([place])    Hospitalized multiple times for bipolar disorder ([facility] - [facility], [facility], and [facility], most recently in [place])  . Depression    bipolar  . Hidradenitis suppurativa   . Hypertension     Family History  Problem Relation Age of Onset  . Depression Mother   . Pulmonary embolism Mother   . Bipolar disorder Mother   . Cancer Father        ?prostate cancer  . Hypertension Maternal Grandfather        had colostomy bag- unsure reason   . Hypertension Paternal Grandmother   . Colon cancer Neg Hx   . Colon polyps Neg Hx   . Stomach cancer Neg Hx   . Rectal cancer Neg Hx   . Breast cancer Neg Hx   . Esophageal cancer Neg Hx     Past Surgical History:  Procedure Laterality Date  . BREAST BIOPSY Left 2012   benign  . INDUCED ABORTION     in patient's 22s in [place], [place].  . WISDOM TOOTH EXTRACTION     Social History   Occupational History  . Not on file  Tobacco Use  . Smoking status: Former Smoker    Types: Cigarettes  . Smokeless tobacco: Never Used  . Tobacco comment: Smoked few cigarettes, socially, "did not inhale"  Substance and Sexual Activity  . Alcohol use: No    Comment: Social alcohol use when younger  . Drug use: No    Comment: Social marijuana use when younger  . Sexual activity: Not on file

## 2018-12-05 ENCOUNTER — Ambulatory Visit
Admission: RE | Admit: 2018-12-05 | Discharge: 2018-12-05 | Disposition: A | Discharge status: Home / Self Care | Payer: Medicare Other | Source: Ambulatory Visit | Attending: Family Medicine | Admitting: Family Medicine

## 2018-12-05 DIAGNOSIS — N6012 Diffuse cystic mastopathy of left breast: Secondary | ICD-10-CM | POA: Diagnosis not present

## 2018-12-05 DIAGNOSIS — R921 Mammographic calcification found on diagnostic imaging of breast: Secondary | ICD-10-CM | POA: Diagnosis not present

## 2018-12-05 LAB — COMPREHENSIVE METABOLIC PANEL
ALT: 15 IU/L (ref 0–32)
AST: 21 IU/L (ref 0–40)
Albumin/Globulin Ratio: 1.4 (ref 1.2–2.2)
Albumin: 4.2 g/dL (ref 3.8–4.9)
Alkaline Phosphatase: 56 IU/L (ref 39–117)
BUN/Creatinine Ratio: 14 (ref 9–23)
BUN: 11 mg/dL (ref 6–24)
Bilirubin Total: <0.2 mg/dL (ref 0.0–1.2)
CO2: 22 mmol/L (ref 20–29)
Calcium: 9.6 mg/dL (ref 8.7–10.2)
Chloride: 104 mmol/L (ref 96–106)
Creatinine, Ser: 0.8 mg/dL (ref 0.57–1.00)
GFR calc Af Amer: 95 mL/min/{1.73_m2} (ref >59)
GFR calc non Af Amer: 83 mL/min/{1.73_m2} (ref >59)
Globulin, Total: 3.1 g/dL (ref 1.5–4.5)
Glucose: 88 mg/dL (ref 65–99)
Potassium: 4.2 mmol/L (ref 3.5–5.2)
Sodium: 140 mmol/L (ref 134–144)
Total Protein: 7.3 g/dL (ref 6.0–8.5)

## 2018-12-05 LAB — LIPID PANEL
Chol/HDL Ratio: 2.1 ratio (ref 0.0–4.4)
Cholesterol, Total: 176 mg/dL (ref 100–199)
HDL: 83 mg/dL (ref >39)
LDL Calculated: 81 mg/dL (ref 0–99)
Triglycerides: 60 mg/dL (ref 0–149)
VLDL Cholesterol Cal: 12 mg/dL (ref 5–40)

## 2018-12-05 LAB — NOVEL CORONAVIRUS, NAA: SARS-CoV-2, NAA: NOT DETECTED

## 2018-12-05 LAB — VITAMIN D 25 HYDROXY (VIT D DEFICIENCY, FRACTURES): Vit D, 25-Hydroxy: 38.2 ng/mL (ref 30.0–100.0)

## 2018-12-06 ENCOUNTER — Telehealth: Payer: Self-pay

## 2018-12-06 DIAGNOSIS — F314 Bipolar disorder, current episode depressed, severe, without psychotic features: Secondary | ICD-10-CM | POA: Diagnosis not present

## 2018-12-06 NOTE — Telephone Encounter (Signed)
-----   Message from Lanae Boast, [place] sent at 12/06/2018  8:17 AM EDT ----- The  labs are stable without significant clinical change.  All other results are normal or within acceptable limits. No Medication changes

## 2018-12-06 NOTE — Telephone Encounter (Signed)
Called, no answer. Left a message that all labs were stable with no change. Advised no medication changes and to call if any questions. Thanks !

## 2018-12-12 ENCOUNTER — Other Ambulatory Visit: Payer: Self-pay

## 2018-12-12 ENCOUNTER — Encounter: Payer: Self-pay | Admitting: Physical Therapy

## 2018-12-12 ENCOUNTER — Ambulatory Visit: Payer: Medicare Other | Attending: Orthopaedic Surgery | Admitting: Physical Therapy

## 2018-12-12 DIAGNOSIS — G8929 Other chronic pain: Secondary | ICD-10-CM | POA: Diagnosis not present

## 2018-12-12 DIAGNOSIS — M25561 Pain in right knee: Secondary | ICD-10-CM | POA: Insufficient documentation

## 2018-12-12 DIAGNOSIS — R2689 Other abnormalities of gait and mobility: Secondary | ICD-10-CM | POA: Diagnosis not present

## 2018-12-12 NOTE — Patient Instructions (Addendum)
  Access Code: [email or web address]  URL: [email or web address]  Date: 12/12/2018  Prepared by: [name]   Exercises  Seated Hamstring Stretch - 3 sets - 30 hold - 2x daily - 6x weekly  Supine Active Straight Leg Raise - 10 reps - 1-3 sets - 2x daily - 6x weekly  Seated Long Arc Quad - 10 reps - 2-3 sets - 2x daily - 6x weekly  Standing Alternating Knee Flexion - 10 reps - 1-2 sets - 2x daily - 6x weekly  Standing Hip Abduction - 10 reps - 1-2 sets - 2x daily - 6x weekly   TENS UNIT: This is helpful for muscle pain and spasm.   Search and Purchase a TENS 7000 2nd edition at www.tenspros.com. It should be less than $30.     TENS unit instructions: Do not shower or bathe with the unit on Turn the unit off before removing electrodes or batteries If the electrodes lose stickiness add a drop of water to the electrodes after they are disconnected from the unit and place on plastic sheet. If you continued to have difficulty, call the TENS unit company to purchase more electrodes. Do not apply lotion on the skin area prior to use. Make sure the skin is clean and dry as this will help prolong the life of the electrodes. After use, always check skin for unusual red areas, rash or other skin difficulties. If there are any skin problems, does not apply electrodes to the same area. Never remove the electrodes from the unit by pulling the wires. Do not use the TENS unit or electrodes other than as directed. Do not change electrode placement without consultating your therapist or physician. Keep 2 fingers with between each electrode. Wear time ratio is 2:1, on to off times.    For example on for 30 minutes off for 15 minutes and then on for 30 minutes off for 15 minutes

## 2018-12-12 NOTE — Therapy (Signed)
[place], [place], [address] Phone: [phone]   Fax:  [phone]  Physical Therapy Evaluation  Patient Details  Name: Stacie Daugherty MRN: [id number] Date of Birth: 1961-05-27 Referring Provider (PT): Mcarthur Rossetti, MD   Encounter Date: 12/12/2018  PT End of Session - 12/12/18 1424    Visit Number  1    Number of Visits  8    Date for PT Re-Evaluation  01/23/19    Authorization Type  MCR/MCD    PT Start Time  1145    PT Stop Time  1230    PT Time Calculation (min)  45 min    Activity Tolerance  Patient tolerated treatment well    Behavior During Therapy  [facility] for tasks assessed/performed       Past Medical History:  Diagnosis Date  . Allergy    mild  . Anxiety    bipolar   . Arthritis    both knees  . Bipolar 1 disorder ([place])    Hospitalized multiple times for bipolar disorder ([facility] - [facility], [facility], and [facility], most recently in [place])  . Depression    bipolar  . Hidradenitis suppurativa   . Hypertension     Past Surgical History:  Procedure Laterality Date  . BREAST BIOPSY Left 2012   benign  . INDUCED ABORTION     in patient's 46s in [place], [place].  . WISDOM TOOTH EXTRACTION      There were no vitals filed for this visit.   Subjective Assessment - 12/12/18 1152    Subjective  Chronic pain of right knee, hamstring strain. She reports 6 weeks ago she backed her leg on a piece of exercise equipment causing her to hyperextend her right knee. She has pain when she stands up of first starts walking but then it eases off. Pain has eased off some from initial injury.    Pertinent History  PMH: knee OA,bipolar, PTSD,HTN,obesity    How long can you sit comfortably?  not limited    How long can you stand comfortably?  30-40 minutes    How long can you walk comfortably?  30 min    Diagnostic tests  knee XR 10/2018 "Age  advanced tricompartmental osteoarthritis, greatest in the patellofemoral compartment. No acute osseous findings"    Patient Stated Goals  decrease pain    Currently in Pain?  Yes    Pain Score  5     Pain Location  Knee    Pain Orientation  Posterior    Pain Descriptors / Indicators  Aching;Throbbing    Pain Type  Acute pain    Pain Radiating Towards  denies    Pain Onset  More than a month ago    Pain Frequency  Intermittent    Aggravating Factors   sit to stand and when she first starts walking    Pain Relieving Factors  rest, has not tried heat or ice    Multiple Pain Sites  No         [facility] PT Assessment - 12/12/18 0001      Assessment   Medical Diagnosis  Chronic pain of right knee, hamstring strain    Referring Provider (PT)  Mcarthur Rossetti, MD    Onset Date/Surgical Date  --   5 week onset of pain   Next MD Visit  one month    Prior Therapy  none  Restrictions   Weight Bearing Restrictions  No      Balance Screen   Has the patient fallen in the past 6 months  No      [place] residence    Additional Comments  no steps or stairs at home but if she needs to do these it hurts      Prior Function   Level of Independence  Independent    Vocation  Unemployed      Cognition   Overall Cognitive Status  Within Functional Limits for tasks assessed      Observation/Other Assessments   Focus on Therapeutic Outcomes (FOTO)   59% limited      Sensation   Light Touch  Appears Intact      Coordination   Gross Motor Movements are Fluid and Coordinated  Yes      ROM / Strength   AROM / PROM / Strength  AROM;Strength      AROM   AROM Assessment Site  Knee    Right/Left Knee  Right    Right Knee Extension  0    Right Knee Flexion  100      Strength   Overall Strength Comments  4+/5 MMT hip and knee bilat, tested in sitting      Flexibility   Soft Tissue Assessment /Muscle Length  yes    Hamstrings  60 deg on Rt       Palpation   Palpation comment  TTP in posterior knee at distal hamstring      Special Tests   Other special tests  only mild pain with resistance and stretching to hamsting      Transfers   Transfers  Independent with all Transfers    Comments  increased time needed with pain from sit to stand, increased time needed for bed transfers      Ambulation/Gait   Gait Comments  slower speed, mild antalgic gait first few steps after standing up then can ambulate with normal pattern                Objective measurements completed on examination: See above findings.      [facility] Adult PT Treatment/Exercise - 12/12/18 0001      Modalities   Modalities  Electrical Stimulation;Moist Heat      Moist Heat Therapy   Number Minutes Moist Heat  10 Minutes    Moist Heat Location  Knee      Electrical Stimulation   Electrical Stimulation Location  Rt knee with heat    Electrical Stimulation Action  IFC    Electrical Stimulation Parameters  tolerance    Electrical Stimulation Goals  Pain             PT Education - 12/12/18 1424    Education Details  HEP, POC, TENS    Person(s) Educated  Patient    Methods  Explanation;Verbal cues;Handout;Demonstration    Comprehension  Verbalized understanding;Need further instruction          PT Long Term Goals - 12/12/18 1430      PT LONG TERM GOAL #1   Title  She will be I and compliant with HEP. (Target goal for all goals 6 weeks 01/23/19)    Status  New      PT LONG TERM GOAL #2   Title  Pt will improve FOTO to less than 47% limited to show improved function.    Status  New  PT LONG TERM GOAL #3   Title  Pt will improve strength to at least 5-/5 MMT to improve function    Status  New      PT LONG TERM GOAL #4   Title  Pt will reduce pain to overall less than 3/10 with sit to stand and ususal activity.    Status  New             Plan - 12/12/18 1425    Clinical Impression Statement  She presents with  subacute Rt posterior knee pain, likely hamstring strain but she does have advanced knee OA confirmed on XR. Her pain has improved slowly over the last 5 weeks but she still has pain with sit to stands, taking her first few steps or if she stands or walks more than 30 min. She has overall decreased hip and knee strength, decreased knee ROM, increased tenderness and tighntess in her hamstring, and increased pain. She will benefit from skilled PT to address her deficits.    Personal Factors and Comorbidities  Comorbidity 1;Comorbidity 2;Comorbidity 3+;Fitness    Comorbidities  PMH: knee OA,bipolar, PTSD,HTN,obesity    Examination-Activity Limitations  Transfers;Locomotion Level;Carry;Lift;Stand;Stairs;Squat    Examination-Participation Restrictions  Meal Prep;Community Activity;Driving;Laundry;Shop    Stability/Clinical Decision Making  Evolving/Moderate complexity    Clinical Decision Making  Moderate    Rehab Potential  Good    PT Frequency  Other (comment)   1-2   PT Duration  6 weeks    PT Treatment/Interventions  ADLs/Self Care Home Management;Aquatic Therapy;Cryotherapy;Electrical Stimulation;Iontophoresis 4mg /ml Dexamethasone;Moist Heat;Ultrasound;Gait training;Stair training;Therapeutic activities;Therapeutic exercise;Neuromuscular re-education;Manual techniques;Passive range of motion;Dry needling;Joint Manipulations;Taping    PT Next Visit Plan  review HEP, consider MT and modalaties to hamstring for pain, needs stretching and hip/knee strength    PT Home Exercise Plan  SLR, hamstring stretch, standing H.S curls,stand  hip abd, LAQ    Consulted and Agree with Plan of Care  Patient       Patient will benefit from skilled therapeutic intervention in order to improve the following deficits and impairments:  Abnormal gait, Decreased activity tolerance, Decreased endurance, Decreased range of motion, Decreased strength, Difficulty walking, Increased muscle spasms, Improper body mechanics,  Postural dysfunction, Obesity, Pain  Visit Diagnosis: 1. Chronic pain of right knee   2. Other abnormalities of gait and mobility        Problem List Patient Active Problem List   Diagnosis Date Noted  . Unilateral primary osteoarthritis, left knee 09/13/2016  . Encounter for gynecological examination without abnormal finding 07/27/2016  . Sprain of medial collateral ligament of left knee 06/07/2016  . Acute pain of left knee 06/02/2016  . Bipolar disorder, curr episode mixed, severe, with psychotic features ([place]) 08/26/2015  . History of posttraumatic stress disorder (PTSD) 08/26/2015  . Essential hypertension 08/26/2015  . History of eczema 04/08/2015  . Pedal edema 08/06/2014  . Positive ANA (antinuclear antibody) 12/19/2012  . Biological false positive RPR test 08/18/2012  . HYPERCHOLESTEROLEMIA 04/28/2009  . HIDRADENITIS SUPPURATIVA 09/06/2007  . OBESITY 04/17/2007    Silvestre Mesi 12/12/2018, 2:34 PM  [facility] [address] [place], [place], [address] Phone: [phone]   Fax:  [phone]  Name: Mouna Yager MRN: [id number] Date of Birth: Oct 30, 1960

## 2018-12-17 ENCOUNTER — Telehealth: Payer: Self-pay | Admitting: Hematology

## 2018-12-17 NOTE — Telephone Encounter (Signed)
Pt is aware covid 19  Test is negative

## 2018-12-25 ENCOUNTER — Other Ambulatory Visit: Payer: Self-pay

## 2018-12-25 ENCOUNTER — Ambulatory Visit: Payer: Medicare Other | Admitting: Physical Therapy

## 2018-12-25 DIAGNOSIS — G8929 Other chronic pain: Secondary | ICD-10-CM

## 2018-12-25 DIAGNOSIS — R2689 Other abnormalities of gait and mobility: Secondary | ICD-10-CM

## 2018-12-25 DIAGNOSIS — M25561 Pain in right knee: Secondary | ICD-10-CM | POA: Diagnosis not present

## 2018-12-25 NOTE — Therapy (Signed)
[place], [place], [address] Phone: [phone]   Fax:  [phone]  Physical Therapy Treatment  Patient Details  Name: Stacie Daugherty MRN: [id number] Date of Birth: 1961-01-10 Referring Provider (PT): Mcarthur Rossetti, MD   Encounter Date: 12/25/2018  PT End of Session - 12/25/18 1035    Visit Number  2    Number of Visits  8    Date for PT Re-Evaluation  01/23/19    Authorization Type  MCR/MCD    PT Start Time  1003    PT Stop Time  1046    PT Time Calculation (min)  43 min    Activity Tolerance  Patient tolerated treatment well    Behavior During Therapy  [facility] for tasks assessed/performed       Past Medical History:  Diagnosis Date  . Allergy    mild  . Anxiety    bipolar   . Arthritis    both knees  . Bipolar 1 disorder ([place])    Hospitalized multiple times for bipolar disorder (DC - [facility], [facility], and [facility], most recently in [place])  . Depression    bipolar  . Hidradenitis suppurativa   . Hypertension     Past Surgical History:  Procedure Laterality Date  . BREAST BIOPSY Left 2012   benign  . INDUCED ABORTION     in patient's 77s in [place], [place].  . WISDOM TOOTH EXTRACTION      There were no vitals filed for this visit.  Subjective Assessment - 12/25/18 1009    Subjective  She relays the knees are doing a little better, I think the exercises are helping    Pertinent History  PMH: knee OA,bipolar, PTSD,HTN,obesity    How long can you sit comfortably?  not limited    How long can you stand comfortably?  30-40 minutes    How long can you walk comfortably?  30 min    Diagnostic tests  knee XR 10/2018 "Age advanced tricompartmental osteoarthritis, greatest in the patellofemoral compartment. No acute osseous findings"    Patient Stated Goals  decrease pain    Currently in Pain?  Yes    Pain Score  5     Pain  Location  Knee    Pain Orientation  Right    Pain Descriptors / Indicators  Aching;Tightness    Pain Type  Chronic pain    Pain Onset  More than a month ago                       [facility] Adult PT Treatment/Exercise - 12/25/18 0001      Exercises   Exercises  Knee/Hip      Knee/Hip Exercises: Stretches   Passive Hamstring Stretch  Right;Left;2 reps;30 seconds    Passive Hamstring Stretch Limitations  seated    Quad Stretch Limitations  supine with strap, leg off table 30 sec X 2      Knee/Hip Exercises: Aerobic   Nustep  L4 X 5 min LE/UE      Knee/Hip Exercises: Standing   Hip Abduction  Both;15 reps    Abduction Limitations  2 lbs      Knee/Hip Exercises: Seated   Long Arc Quad  Both;15 reps    Long Arc Quad Weight  2 lbs.      Knee/Hip Exercises: Supine   Short Arc Quad Sets  Both;15 reps  Heel Slides  Both;15 reps    Hip Adduction Isometric  15 reps    Bridges  15 reps    Straight Leg Raises  Both;10 reps      Modalities   Modalities  Electrical Stimulation;Moist Heat      Moist Heat Therapy   Number Minutes Moist Heat  10 Minutes    Moist Heat Location  Knee      Electrical Stimulation   Electrical Stimulation Location  Rt knee with heat    Electrical Stimulation Action  IFC    Electrical Stimulation Parameters  tolreance    Electrical Stimulation Goals  Pain             PT Education - 12/25/18 1035    Education Details  HEP review    Person(s) Educated  Patient    Methods  Explanation;Demonstration;Verbal cues    Comprehension  Verbalized understanding;Returned demonstration          PT Long Term Goals - 12/12/18 1430      PT LONG TERM GOAL #1   Title  She will be I and compliant with HEP. (Target goal for all goals 6 weeks 01/23/19)    Status  New      PT LONG TERM GOAL #2   Title  Pt will improve FOTO to less than 47% limited to show improved function.    Status  New      PT LONG TERM GOAL #3   Title  Pt will improve  strength to at least 5-/5 MMT to improve function    Status  New      PT LONG TERM GOAL #4   Title  Pt will reduce pain to overall less than 3/10 with sit to stand and ususal activity.    Status  New            Plan - 12/25/18 1036    Clinical Impression Statement  Pain is improving and was not posteriorly today in her hamstring, appears to be more OA pain as she says it is mostly first thing in the morning and is more deep in the center. HEP reviewed with her and she showed good understanding and had good tolerance with these today. PT will  progress as able.    Personal Factors and Comorbidities  Comorbidity 1;Comorbidity 2;Comorbidity 3+;Fitness    Comorbidities  PMH: knee OA,bipolar, PTSD,HTN,obesity    Examination-Activity Limitations  Transfers;Locomotion Level;Carry;Lift;Stand;Stairs;Squat    Examination-Participation Restrictions  Meal Prep;Community Activity;Driving;Laundry;Shop    Stability/Clinical Decision Making  Evolving/Moderate complexity    Rehab Potential  Good    PT Frequency  Other (comment)   1-2   PT Duration  6 weeks    PT Treatment/Interventions  ADLs/Self Care Home Management;Aquatic Therapy;Cryotherapy;Electrical Stimulation;Iontophoresis 4mg /ml Dexamethasone;Moist Heat;Ultrasound;Gait training;Stair training;Therapeutic activities;Therapeutic exercise;Neuromuscular re-education;Manual techniques;Passive range of motion;Dry needling;Joint Manipulations;Taping    PT Next Visit Plan  consider MT and modalaties for pain, needs stretching and hip/knee strength    PT Home Exercise Plan  SLR, hamstring stretch, standing H.S curls,stand  hip abd, LAQ    Consulted and Agree with Plan of Care  Patient       Patient will benefit from skilled therapeutic intervention in order to improve the following deficits and impairments:  Abnormal gait, Decreased activity tolerance, Decreased endurance, Decreased range of motion, Decreased strength, Difficulty walking, Increased  muscle spasms, Improper body mechanics, Postural dysfunction, Obesity, Pain  Visit Diagnosis: 1. Chronic pain of right knee   2. Other abnormalities of  gait and mobility        Problem List Patient Active Problem List   Diagnosis Date Noted  . Unilateral primary osteoarthritis, left knee 09/13/2016  . Encounter for gynecological examination without abnormal finding 07/27/2016  . Sprain of medial collateral ligament of left knee 06/07/2016  . Acute pain of left knee 06/02/2016  . Bipolar disorder, curr episode mixed, severe, with psychotic features ([place]) 08/26/2015  . History of posttraumatic stress disorder (PTSD) 08/26/2015  . Essential hypertension 08/26/2015  . History of eczema 04/08/2015  . Pedal edema 08/06/2014  . Positive ANA (antinuclear antibody) 12/19/2012  . Biological false positive RPR test 08/18/2012  . HYPERCHOLESTEROLEMIA 04/28/2009  . HIDRADENITIS SUPPURATIVA 09/06/2007  . OBESITY 04/17/2007    Silvestre Mesi 12/25/2018, 10:38 AM  [facility] [address] [place], [place], [address] Phone: [phone]   Fax:  [phone]  Name: Stacie Daugherty MRN: [id number] Date of Birth: [birth year]

## 2019-01-01 ENCOUNTER — Ambulatory Visit: Payer: Medicare Other | Attending: Orthopaedic Surgery | Admitting: Physical Therapy

## 2019-01-01 ENCOUNTER — Other Ambulatory Visit: Payer: Self-pay

## 2019-01-01 DIAGNOSIS — G8929 Other chronic pain: Secondary | ICD-10-CM | POA: Insufficient documentation

## 2019-01-01 DIAGNOSIS — M25561 Pain in right knee: Secondary | ICD-10-CM | POA: Diagnosis not present

## 2019-01-01 DIAGNOSIS — R2689 Other abnormalities of gait and mobility: Secondary | ICD-10-CM | POA: Insufficient documentation

## 2019-01-01 NOTE — Therapy (Addendum)
[place], [place], [address] Phone: [phone]   Fax:  [phone]  Physical Therapy Treatment/Discharge addendum PHYSICAL THERAPY DISCHARGE SUMMARY  Visits from Start of Care: 3  Current functional level related to goals / functional outcomes: See below   Remaining deficits: See below   Education / Equipment: HEP Plan: Patient agrees to discharge.  Patient goals were not met. Patient is being discharged due to not returning since the last visit.  ?????   Stacie Daugherty, PT, DPT 04/09/19 11:30 AM     Patient Details  Name: Stacie Daugherty MRN: [id number] Date of Birth: [birth year] Referring Provider (PT): Mcarthur Rossetti, MD   Encounter Date: 01/01/2019  PT End of Session - 01/01/19 1045    Visit Number  3    Number of Visits  8    Date for PT Re-Evaluation  01/23/19    Authorization Type  MCR/MCD    PT Start Time  1000    PT Stop Time  1050    PT Time Calculation (min)  50 min    Activity Tolerance  Patient tolerated treatment well    Behavior During Therapy  [facility] for tasks assessed/performed       Past Medical History:  Diagnosis Date  . Allergy    mild  . Anxiety    bipolar   . Arthritis    both knees  . Bipolar 1 disorder ([place])    Hospitalized multiple times for bipolar disorder (DC - [facility], [facility], and [facility], most recently in [place])  . Depression    bipolar  . Hidradenitis suppurativa   . Hypertension     Past Surgical History:  Procedure Laterality Date  . BREAST BIOPSY Left 2012   benign  . INDUCED ABORTION     in patient's 35s in [place], [place].  . WISDOM TOOTH EXTRACTION      There were no vitals filed for this visit.  Subjective Assessment - 01/01/19 1010    Subjective  Relays her knees are a little better, pain is down to a 5/10 in her knee    Pertinent History  PMH: knee OA,bipolar,  PTSD,HTN,obesity    How long can you sit comfortably?  not limited    How long can you stand comfortably?  30-40 minutes    How long can you walk comfortably?  30 min    Diagnostic tests  knee XR 10/2018 "Age advanced tricompartmental osteoarthritis, greatest in the patellofemoral compartment. No acute osseous findings"    Patient Stated Goals  decrease pain    Pain Onset  More than a month ago                       [facility] Adult PT Treatment/Exercise - 01/01/19 0001      Knee/Hip Exercises: Stretches   Passive Hamstring Stretch  Right;Left;2 reps;30 seconds    Passive Hamstring Stretch Limitations  seated    Quad Stretch Limitations  supine with strap, leg off table 30 sec X 2      Knee/Hip Exercises: Aerobic   Nustep  L4 X 6 min LE/UE      Knee/Hip Exercises: Standing   Knee Flexion  Both;15 reps    Knee Flexion Limitations  2 lbs    Hip Abduction  Both;15 reps    Abduction Limitations  2 lbs      Knee/Hip Exercises: Seated   Long [employer]  Both;15 reps    Long Arc Quad Weight  2 lbs.    Sit to [employer]  2 sets;5 reps      Knee/Hip Exercises: Supine   Short Arc [employer]  Both;15 reps    Short Arc Quad Sets Limitations  2 lbs    Heel Slides  Right;AAROM;15 reps    Hip Adduction Isometric  15 reps    Bridges  15 reps    Bridges Limitations  with ball sq    Straight Leg Raises  Both;15 reps      Modalities   Modalities  Electrical Stimulation;Moist Heat      Moist Heat Therapy   Number Minutes Moist Heat  10 Minutes    Moist Heat Location  Knee      Electrical Stimulation   Electrical Stimulation Location  bilat knees    Electrical Stimulation Action  IFC    Electrical Stimulation Parameters  tolerance    Electrical Stimulation Goals  Pain                  PT Long Term Goals - 01/01/19 1046      PT LONG TERM GOAL #1   Title  She will be I and compliant with HEP. (Target goal for all goals 6 weeks 01/23/19)    Status  On-going      PT LONG  TERM GOAL #2   Title  Pt will improve FOTO to less than 47% limited to show improved function.    Status  On-going      PT LONG TERM GOAL #3   Title  Pt will improve strength to at least 5-/5 MMT to improve function    Status  On-going      PT LONG TERM GOAL #4   Title  Pt will reduce pain to overall less than 3/10 with sit to stand and ususal activity.    Status  On-going            Plan - 01/01/19 1045    Clinical Impression Statement  She was able to progress strength program without complaints. She is progressing with PT and having overall less pain, continue POC       Patient will benefit from skilled therapeutic intervention in order to improve the following deficits and impairments:     Visit Diagnosis: 1. Chronic pain of right knee   2. Other abnormalities of gait and mobility        Problem List Patient Active Problem List   Diagnosis Date Noted  . Unilateral primary osteoarthritis, left knee 09/13/2016  . Encounter for gynecological examination without abnormal finding 07/27/2016  . Sprain of medial collateral ligament of left knee 06/07/2016  . Acute pain of left knee 06/02/2016  . Bipolar disorder, curr episode mixed, severe, with psychotic features ([place]) 08/26/2015  . History of posttraumatic stress disorder (PTSD) 08/26/2015  . Essential hypertension 08/26/2015  . History of eczema 04/08/2015  . Pedal edema 08/06/2014  . Positive ANA (antinuclear antibody) 12/19/2012  . Biological false positive RPR test 08/18/2012  . HYPERCHOLESTEROLEMIA 04/28/2009  . HIDRADENITIS SUPPURATIVA 09/06/2007  . OBESITY 04/17/2007    Stacie Daugherty 01/01/2019, 10:47 AM  [facility] [address] [place], [place], [address] Phone: [phone]   Fax:  [phone]  Name: Stacie Daugherty MRN: [id number] Date of Birth: 06-Jun-1960

## 2019-01-08 ENCOUNTER — Ambulatory Visit: Payer: Medicare Other | Admitting: Orthopaedic Surgery

## 2019-01-08 ENCOUNTER — Ambulatory Visit: Payer: Medicare Other | Admitting: Physical Therapy

## 2019-01-08 DIAGNOSIS — F314 Bipolar disorder, current episode depressed, severe, without psychotic features: Secondary | ICD-10-CM | POA: Diagnosis not present

## 2019-01-10 DIAGNOSIS — F314 Bipolar disorder, current episode depressed, severe, without psychotic features: Secondary | ICD-10-CM | POA: Diagnosis not present

## 2019-01-15 ENCOUNTER — Ambulatory Visit: Payer: Medicare Other | Admitting: Physical Therapy

## 2019-01-17 ENCOUNTER — Other Ambulatory Visit: Payer: Self-pay | Admitting: Family Medicine

## 2019-01-17 ENCOUNTER — Ambulatory Visit: Payer: Medicare Other | Admitting: Physical Therapy

## 2019-01-17 ENCOUNTER — Telehealth: Payer: Self-pay

## 2019-01-17 DIAGNOSIS — I10 Essential (primary) hypertension: Secondary | ICD-10-CM

## 2019-01-17 NOTE — Telephone Encounter (Signed)
Referral placed.

## 2019-01-17 NOTE — Telephone Encounter (Signed)
Patient called, she is wanting to start a work out program with a Physiological scientist and they will not let her start until she has a stress test. Can you refer her for this? The workout routine will be 3 days a week for 30 minutes. Please advise.

## 2019-01-17 NOTE — Progress Notes (Signed)
Referral placed to cardiology for stress test.

## 2019-01-31 DIAGNOSIS — F314 Bipolar disorder, current episode depressed, severe, without psychotic features: Secondary | ICD-10-CM | POA: Diagnosis not present

## 2019-02-06 ENCOUNTER — Encounter (HOSPITAL_COMMUNITY): Payer: Self-pay

## 2019-02-06 ENCOUNTER — Encounter (HOSPITAL_COMMUNITY): Payer: Self-pay | Admitting: *Deleted

## 2019-02-07 DIAGNOSIS — F314 Bipolar disorder, current episode depressed, severe, without psychotic features: Secondary | ICD-10-CM | POA: Diagnosis not present

## 2019-02-19 ENCOUNTER — Ambulatory Visit: Payer: Medicare Other | Admitting: Cardiology

## 2019-02-20 DIAGNOSIS — Z20828 Contact with and (suspected) exposure to other viral communicable diseases: Secondary | ICD-10-CM | POA: Diagnosis not present

## 2019-02-27 ENCOUNTER — Other Ambulatory Visit: Payer: Self-pay | Admitting: Family Medicine

## 2019-02-27 DIAGNOSIS — E559 Vitamin D deficiency, unspecified: Secondary | ICD-10-CM

## 2019-03-25 ENCOUNTER — Ambulatory Visit (INDEPENDENT_AMBULATORY_CARE_PROVIDER_SITE_OTHER): Payer: Medicare Other | Admitting: Cardiology

## 2019-03-25 ENCOUNTER — Other Ambulatory Visit: Payer: Self-pay

## 2019-03-25 ENCOUNTER — Encounter: Payer: Self-pay | Admitting: Cardiology

## 2019-03-25 VITALS — BP 138/90 | HR 72 | Temp 97.5°F | Ht 63.0 in | Wt 274.0 lb

## 2019-03-25 DIAGNOSIS — E7800 Pure hypercholesterolemia, unspecified: Secondary | ICD-10-CM

## 2019-03-25 DIAGNOSIS — Z7182 Exercise counseling: Secondary | ICD-10-CM | POA: Diagnosis not present

## 2019-03-25 DIAGNOSIS — Z7189 Other specified counseling: Secondary | ICD-10-CM

## 2019-03-25 DIAGNOSIS — R0789 Other chest pain: Secondary | ICD-10-CM | POA: Diagnosis not present

## 2019-03-25 DIAGNOSIS — Z713 Dietary counseling and surveillance: Secondary | ICD-10-CM | POA: Diagnosis not present

## 2019-03-25 DIAGNOSIS — I10 Essential (primary) hypertension: Secondary | ICD-10-CM

## 2019-03-25 DIAGNOSIS — Z6841 Body Mass Index (BMI) 40.0 and over, adult: Secondary | ICD-10-CM | POA: Diagnosis not present

## 2019-03-25 DIAGNOSIS — R079 Chest pain, unspecified: Secondary | ICD-10-CM | POA: Diagnosis not present

## 2019-03-25 NOTE — Patient Instructions (Addendum)
Medication Instructions:  Your Physician recommend you continue on your current medication as directed.    *If you need a refill on your cardiac medications before your next appointment, please call your pharmacy*  Lab Work: None  Testing/Procedures: Your physician has requested that you have an exercise tolerance test. For further information please visit [email or web address]. Please also follow instruction sheet, as given. [place] [place]   Follow-Up: At [facility], you and your health needs are our priority.  As part of our continuing mission to provide you with exceptional heart care, we have created designated Provider Care Teams.  These Care Teams include your primary Cardiologist (physician) and Advanced Practice Providers (APPs -  Physician Assistants and Nurse Practitioners) who all work together to provide you with the care you need, when you need it.  Your next appointment:   6 months  The format for your next appointment:   In Person  Provider:   Buford Dresser, MD    [facility] at [facility] [address] [place], [place] [address] Phone:  [phone]        You are scheduled for an Exercise Stress Test  Please arrive 15 minutes prior to your appointment time for registration and insurance purposes.  The test will take approximately 45 minutes to complete.  How to prepare for your Exercise Stress Test: Do bring a list of your current medications with you.  If not listed below, you may take your medications as normal. . Do not take carvedilol (Coreg) for 48 hours prior to the test.  Bring the medication to your appointment as you may be required to take it once the test is complete. . Do wear comfortable clothes (no dresses or overalls) and walking shoes, tennis shoes preferred (no heels or open toed shoes are allowed) . Do Not wear cologne,  perfume, aftershave or lotions (deodorant is allowed). . Please report to [place], [address] for your test.  If these instructions are not followed, your test will have to be rescheduled.  If you have questions or concerns about your appointment, you can call the Stress Lab at [phone].  If you cannot keep your appointment, please provide 24 hours notification to the Stress Lab, to avoid a possible $50 charge to your account

## 2019-03-25 NOTE — Progress Notes (Signed)
Cardiology Office Note:    Date:  03/25/2019   ID:  Stacie Daugherty, DOB [birth year], MRN [id number]  PCP:  Stacie Daugherty, [place]  Cardiologist:  Stacie Dresser, MD  Referring MD: Stacie Daugherty, [place]   Chief Complaint  Patient presents with   New Patient (Initial Visit)   Shortness of Breath    History of Present Illness:    Stacie Daugherty is a 58 y.o. female with a hx of hypertension, hyperlipidemia, obesity, osteoarthritis, bipolar disorder, depression, anxiety who is seen as a new consult at the request of Stacie Daugherty, [place] for the evaluation and management of hypertension, indication for stress test.  Per phone notes of 01/17/19, she plans to start workout with personal trainer, and they will not let her being until she has had a stress test.  Patient concerns today: -wants to discuss stress test, wants to start working out  Cardiovascular risk factors: Prior clinical ASCVD: none Comorbid conditions: Yes--hypertension, hyperlipidemia. Denies: diabetes, chronic kidney disease:  Metabolic syndrome/Obesity: yes, BMI 48. Chronic inflammatory conditions: none Tobacco use history: never Family history:  mom had chronic heart failure, had pulmonary issues/PE. Grandmother died age 75, unclear if heart disease or old age. No early cardiac death, no MI that she knows of. Brother (who is physician) had mild stroke a few months ago.  Prior cardiac testing and/or incidental findings on other testing (ie coronary calcium): none Exercise level: not at all, sedentary. Has knee pain. Can climb stairs, though this is limiting for her due to knee pain Current diet: trying to improve diet, eat more vegetables. Eats a lot of junk food, chips, etc. Doesn't cook much, eats out a lot. Does try to drink water.  One week ago, had tightness in her chest. Was driving in the car. Nonexertional. Lasted a few minutes, felt like a tightness in her left breast. No  shortness of breath, nausea, diaphoresis. Went away on its own. She thought it might be heart.  Had done physical therapy in the past, feels she can treadmill.  Denies shortness of breath at rest or with normal exertion. No PND, orthopnea, severe LE edema (has chronic mild LE edema) or unexpected weight gain. No syncope or palpitations. Does snore a little bit, denies other sleep apnea symptoms.   Past Medical History:  Diagnosis Date   Allergy    mild   Anxiety    bipolar    Arthritis    both knees   Bipolar 1 disorder ([place])    Hospitalized multiple times for bipolar disorder (DC - [facility], [facility], and [facility], most recently in [place])   Depression    bipolar   Hidradenitis suppurativa    Hypertension     Past Surgical History:  Procedure Laterality Date   BREAST BIOPSY Left 2012   benign   INDUCED ABORTION     in patient's 79s in [place], [place].   WISDOM TOOTH EXTRACTION      Current Medications: Current Outpatient Medications on File Prior to Visit  Medication Sig   amLODipine (NORVASC) 10 MG tablet Take 1 tablet (10 mg total) by mouth daily. for high blood pressure   amoxicillin (AMOXIL) 500 MG tablet Take 500 mg by mouth 3 (three) times daily.   atorvastatin (LIPITOR) 20 MG tablet Take 1 tablet (20 mg total) by mouth daily.   carbamazepine (TEGRETOL) 200 MG tablet Take 1 tablet (200 mg total) by mouth 2 (two) times daily at 8 am and  10 pm.   carvedilol (COREG) 3.125 MG tablet Take 1 tablet (3.125 mg total) by mouth daily.   Cholecalciferol (VITAMIN D3) 25 MCG (1000 UT) CAPS Take 1,000 Units by mouth daily.   divalproex (DEPAKOTE) 500 MG DR tablet Take 500 mg by mouth 2 (two) times daily.   ergocalciferol (VITAMIN D2) 1.25 MG (50000 UT) capsule Take 50,000 Units by mouth once a week.   gabapentin (NEURONTIN) 300 MG capsule Take 1 capsule (300 mg total) by mouth 3 (three) times daily at 8am, 2pm and bedtime. For  agitation   MELATONIN PO Take 1 tablet by mouth at bedtime.   triamcinolone ointment (KENALOG) 0.5 % Apply 1 application topically 2 (two) times daily.   vitamin C (ASCORBIC ACID) 500 MG tablet Take 500 mg by mouth daily.   Current Facility-Administered Medications on File Prior to Visit  Medication   0.9 %  sodium chloride infusion     Allergies:   Haldol [haloperidol lactate]   Social History   Tobacco Use   Smoking status: Former Smoker    Types: Cigarettes   Smokeless tobacco: Never Used   Tobacco comment: Smoked few cigarettes, socially, "did not inhale"  Substance Use Topics   Alcohol use: No    Comment: Social alcohol use when younger   Drug use: No    Comment: Social marijuana use when younger    Family History: family history includes Bipolar disorder in her mother; Cancer in her father; Depression in her mother; Hypertension in her maternal grandfather and paternal grandmother; Pulmonary embolism in her mother. There is no history of Colon cancer, Colon polyps, Stomach cancer, Rectal cancer, Breast cancer, or Esophageal cancer.  ROS:   Please see the history of present illness.  Additional pertinent ROS: Constitutional: Negative for chills, fever, night sweats, unintentional weight loss  HENT: Negative for ear pain and hearing loss.   Eyes: Negative for loss of vision and eye pain.  Respiratory: Negative for cough, sputum, wheezing.   Cardiovascular: See HPI. Gastrointestinal: Negative for abdominal pain, melena, and hematochezia.  Genitourinary: Negative for dysuria and hematuria.  Musculoskeletal: Negative for falls and myalgias.  Skin: Negative for itching and rash.  Neurological: Negative for focal weakness, focal sensory changes and loss of consciousness.  Endo/Heme/Allergies: Does not bruise/bleed easily.     EKGs/Labs/Other Studies Reviewed:    The following studies were reviewed today: Echo 2016 - Left ventricle: The cavity size was normal.  There was mild  concentric hypertrophy. Systolic function was vigorous. The  estimated ejection fraction was in the range of 65% to 70%. There  was dynamic obstruction in the outflow tract, with a peak  velocity of 148 cm/sec and a peak gradient of 9 mm Hg. Wall  motion was normal; there were no regional wall motion  abnormalities.  - Mitral valve: There was mild regurgitation.  - Left atrium: The atrium was mildly dilated.  - Pulmonary arteries: Systolic pressure was moderately increased.  PA peak pressure: 47 mm Hg (S).   EKG:  EKG is personally reviewed.  The ekg ordered today demonstrates NSR with HR 72 bpm.  Recent Labs: 12/04/2018: ALT 15; BUN 11; Creatinine, Ser 0.80; Potassium 4.2; Sodium 140  Recent Lipid Panel    Component Value Date/Time   CHOL 176 12/04/2018 0933   TRIG 60 12/04/2018 0933   HDL 83 12/04/2018 0933   CHOLHDL 2.1 12/04/2018 0933   CHOLHDL 2.0 03/31/2017 0904   VLDL 26 07/12/2016 0855   LDLCALC 81 12/04/2018 0933  [place] 64 03/31/2017 0904    Physical Exam:    VS:  BP 138/90 (BP Location: Right Arm, Patient Position: Sitting, Cuff Size: Large)    Pulse 72    Temp (!) 97.5 F (36.4 C)    Ht 5\' 3"  (1.6 m)    Wt 274 lb (124.3 kg)    BMI 48.54 kg/m     Wt Readings from Last 3 Encounters:  03/25/19 274 lb (124.3 kg)  11/26/18 270 lb (122.5 kg)  07/09/18 262 lb (118.8 kg)    GEN: Well nourished, well developed in no acute distress HEENT: Normal, moist mucous membranes NECK: No JVD CARDIAC: regular rhythm, normal S1 and S2, no murmurs, rubs, gallops.  VASCULAR: Radial and DP pulses 2+ bilaterally. No carotid bruits RESPIRATORY:  Clear to auscultation without rales, wheezing or rhonchi  ABDOMEN: Soft, non-tender, non-distended MUSCULOSKELETAL:  Ambulates independently SKIN: Warm and dry, no edema NEUROLOGIC:  Alert and oriented x 3. No focal neuro deficits noted. PSYCHIATRIC:  Normal affect    ASSESSMENT:    1. Chest pain,  unspecified type   2. Essential hypertension   3. HYPERCHOLESTEROLEMIA   4. Nutritional counseling   5. Exercise counseling   6. Cardiac risk counseling   7. Counseling on health promotion and disease prevention   8. Class 3 severe obesity due to excess calories with serious comorbidity and body mass index (BMI) of 45.0 to 49.9 in adult [facility])    PLAN:    Chest discomfort: single nonexertional episode, but has significant risk factors. With plans to begin exercise regimen, need to exclude ischemia as cause. -discussed treadmill stress, nuclear stress/lexiscan, and CT coronary angiography. Discussed pros and cons of each, including but not limited to false positive/false negative risk, radiation risk, and risk of IV contrast dye. Based on shared decision making, decision was made to pursue treadmill stress test -hold carvedilol prior to test  Cardiac risk counseling and prevention recommendations: -recommend heart healthy/Mediterranean diet, with whole grains, fruits, vegetable, fish, lean meats, nuts, and olive oil. Limit salt. -recommend moderate walking, 3-5 times/week for 30-50 minutes each session. Aim for at least 150 minutes.week. Goal should be pace of 3 miles/hours, or walking 1.5 miles in 30 minutes -recommend avoidance of tobacco products. Avoid excess alcohol. -Additional risk factor control:  -Diabetes risk: A1c is 5.3, monitor  -Hypercholesterolemia: from 11/2018 reviewed. Very well controlled, continue atorvastatin  -Hypertension: Goal <130/80. Not at goal today, but was much better at last PCP office visit. Continue to monitor, ok to continue amlodipine 10 mg daily and carvedilol 3.125 mg BID (holding temporarily for stress test)  -Weight: BMI 48, class 3 severe obesity with serious comorbidity. Counseled on importance of weight loss. Discussed diet and exercise goals. She is motivated to make change. -ASCVD risk score: The 10-year ASCVD risk score Mikey Bussing DC Brooke Bonito., et al., 2013)  is: 4.6%   Values used to calculate the score:     Age: 17 years     Sex: Female     Is Non-Hispanic African American: Yes     Diabetic: No     Tobacco smoker: No     Systolic Blood Pressure: 0000000 mmHg     Is BP treated: Yes     HDL Cholesterol: 83 mg/dL     Total Cholesterol: 176 mg/dL    Plan for follow up: 6 mos or sooner PRN  Medication Adjustments/Labs and Tests Ordered: Current medicines are reviewed at length with the patient today.  Concerns regarding medicines  are outlined above.  Orders Placed This Encounter  Procedures   EXERCISE TOLERANCE TEST (ETT)   EKG 12-Lead   No orders of the defined types were placed in this encounter.   Patient Instructions  Medication Instructions:  Your Physician recommend you continue on your current medication as directed.    *If you need a refill on your cardiac medications before your next appointment, please call your pharmacy*  Lab Work: None  Testing/Procedures: Your physician has requested that you have an exercise tolerance test. For further information please visit [email or web address]. Please also follow instruction sheet, as given. [place]   Follow-Up: At [facility], you and your health needs are our priority.  As part of our continuing mission to provide you with exceptional heart care, we have created designated Provider Care Teams.  These Care Teams include your primary Cardiologist (physician) and Advanced Practice Providers (APPs -  Physician Assistants and Nurse Practitioners) who all work together to provide you with the care you need, when you need it.  Your next appointment:   6 months  The format for your next appointment:   In Person  Provider:   Buford Dresser, MD    [facility] [address], [place], [place] [address] Phone:  [phone]        You are  scheduled for an Exercise Stress Test  Please arrive 15 minutes prior to your appointment time for registration and insurance purposes.  The test will take approximately 45 minutes to complete.  How to prepare for your Exercise Stress Test: Do bring a list of your current medications with you.  If not listed below, you may take your medications as normal.  Do not take carvedilol (Coreg) for 48 hours prior to the test.  Bring the medication to your appointment as you may be required to take it once the test is complete.  Do wear comfortable clothes (no dresses or overalls) and walking shoes, tennis shoes preferred (no heels or open toed shoes are allowed)  Do Not wear cologne, perfume, aftershave or lotions (deodorant is allowed).  Please report to [place], [address] for your test.  If these instructions are not followed, your test will have to be rescheduled.  If you have questions or concerns about your appointment, you can call the Stress Lab at [phone].  If you cannot keep your appointment, please provide 24 hours notification to the Stress Lab, to avoid a possible $50 charge to your account      Signed, Stacie Dresser, MD PhD 03/25/2019 3:23 PM    [place]

## 2019-04-02 DIAGNOSIS — F314 Bipolar disorder, current episode depressed, severe, without psychotic features: Secondary | ICD-10-CM | POA: Diagnosis not present

## 2019-04-04 DIAGNOSIS — F314 Bipolar disorder, current episode depressed, severe, without psychotic features: Secondary | ICD-10-CM | POA: Diagnosis not present

## 2019-04-05 ENCOUNTER — Telehealth (HOSPITAL_COMMUNITY): Payer: Self-pay

## 2019-04-05 NOTE — Telephone Encounter (Signed)
Encounter complete. 

## 2019-04-06 ENCOUNTER — Inpatient Hospital Stay (HOSPITAL_COMMUNITY): Admission: RE | Admit: 2019-04-06 | Payer: Medicare Other | Source: Ambulatory Visit

## 2019-04-08 ENCOUNTER — Telehealth: Payer: Self-pay | Admitting: Cardiology

## 2019-04-08 NOTE — Telephone Encounter (Signed)
Will route to MD- not sure how to proceed, I know they will need the COVID testing before the test she is having done, but it may not come back in time.

## 2019-04-08 NOTE — Telephone Encounter (Signed)
I would reschedule to make sure we get the covid results in time. Thanks.

## 2019-04-08 NOTE — Telephone Encounter (Signed)
New Message   Received an afterhours message 11/07 at 3:10pm. Patient states that she forgot to get her Covid19 test for her test on 11/11. Please advise.

## 2019-04-08 NOTE — Telephone Encounter (Signed)
Spoke to pt and rescheduled COVID test for 11/14.

## 2019-04-08 NOTE — Telephone Encounter (Signed)
Follow up   Patient is calling because she has questions about setting up her covid test. Please advise.

## 2019-04-08 NOTE — Telephone Encounter (Signed)
Called patient- advised that we would reschedule test on 11/11-  Will route to scheduling- patient will need to be reset up for COVID testing, primary nurse will do this.

## 2019-04-10 ENCOUNTER — Encounter (HOSPITAL_COMMUNITY): Payer: Medicare Other

## 2019-04-12 ENCOUNTER — Telehealth (HOSPITAL_COMMUNITY): Payer: Self-pay

## 2019-04-12 NOTE — Telephone Encounter (Signed)
Encounter complete. 

## 2019-04-13 ENCOUNTER — Other Ambulatory Visit (HOSPITAL_COMMUNITY)
Admission: RE | Admit: 2019-04-13 | Discharge: 2019-04-13 | Disposition: A | Discharge status: Home / Self Care | Payer: Medicare Other | Source: Ambulatory Visit | Attending: Cardiology | Admitting: Cardiology

## 2019-04-13 DIAGNOSIS — Z20828 Contact with and (suspected) exposure to other viral communicable diseases: Secondary | ICD-10-CM | POA: Insufficient documentation

## 2019-04-13 DIAGNOSIS — Z01812 Encounter for preprocedural laboratory examination: Secondary | ICD-10-CM | POA: Insufficient documentation

## 2019-04-14 LAB — NOVEL CORONAVIRUS, NAA (HOSP ORDER, SEND-OUT TO REF LAB; TAT 18-24 HRS): SARS-CoV-2, NAA: NOT DETECTED

## 2019-04-17 ENCOUNTER — Other Ambulatory Visit: Payer: Self-pay

## 2019-04-17 ENCOUNTER — Ambulatory Visit (HOSPITAL_COMMUNITY)
Admission: RE | Admit: 2019-04-17 | Discharge: 2019-04-17 | Disposition: A | Discharge status: Home / Self Care | Payer: Medicare Other | Source: Ambulatory Visit | Attending: Cardiology | Admitting: Cardiology

## 2019-04-17 DIAGNOSIS — R079 Chest pain, unspecified: Secondary | ICD-10-CM | POA: Insufficient documentation

## 2019-04-17 LAB — EXERCISE TOLERANCE TEST
Estimated workload: 5.2 METS
Exercise duration (min): 3 min
Exercise duration (sec): 31 s
MPHR: 163 {beats}/min
Peak HR: 148 {beats}/min
Percent HR: 90 %
Rest HR: 75 {beats}/min

## 2019-04-27 DIAGNOSIS — Z23 Encounter for immunization: Secondary | ICD-10-CM | POA: Diagnosis not present

## 2019-04-29 ENCOUNTER — Encounter: Payer: Self-pay | Admitting: Gastroenterology

## 2019-05-03 ENCOUNTER — Encounter: Payer: Self-pay | Admitting: Gastroenterology

## 2019-05-29 ENCOUNTER — Ambulatory Visit: Payer: Self-pay | Admitting: Family Medicine

## 2019-06-06 ENCOUNTER — Encounter: Payer: Medicare Other | Admitting: Gastroenterology

## 2019-07-03 ENCOUNTER — Encounter: Payer: Self-pay | Admitting: Gastroenterology

## 2019-07-10 ENCOUNTER — Encounter: Payer: Self-pay | Admitting: Nurse Practitioner

## 2019-07-10 ENCOUNTER — Ambulatory Visit (INDEPENDENT_AMBULATORY_CARE_PROVIDER_SITE_OTHER): Payer: Medicare Other | Admitting: Nurse Practitioner

## 2019-07-10 ENCOUNTER — Other Ambulatory Visit: Payer: Self-pay

## 2019-07-10 ENCOUNTER — Ambulatory Visit (AMBULATORY_SURGERY_CENTER): Payer: Self-pay

## 2019-07-10 ENCOUNTER — Ambulatory Visit: Payer: Medicare Other | Admitting: Nurse Practitioner

## 2019-07-10 VITALS — BP 123/63 | HR 74 | Temp 98.2°F | Resp 16 | Ht 63.0 in | Wt 274.0 lb

## 2019-07-10 VITALS — Temp 96.8°F | Ht 63.0 in | Wt 275.0 lb

## 2019-07-10 DIAGNOSIS — E7849 Other hyperlipidemia: Secondary | ICD-10-CM

## 2019-07-10 DIAGNOSIS — M79644 Pain in right finger(s): Secondary | ICD-10-CM | POA: Diagnosis not present

## 2019-07-10 DIAGNOSIS — Z0000 Encounter for general adult medical examination without abnormal findings: Secondary | ICD-10-CM | POA: Diagnosis not present

## 2019-07-10 DIAGNOSIS — I10 Essential (primary) hypertension: Secondary | ICD-10-CM

## 2019-07-10 DIAGNOSIS — M79604 Pain in right leg: Secondary | ICD-10-CM | POA: Diagnosis not present

## 2019-07-10 DIAGNOSIS — Z86010 Personal history of colonic polyps: Secondary | ICD-10-CM

## 2019-07-10 DIAGNOSIS — Z01818 Encounter for other preprocedural examination: Secondary | ICD-10-CM

## 2019-07-10 DIAGNOSIS — R809 Proteinuria, unspecified: Secondary | ICD-10-CM | POA: Insufficient documentation

## 2019-07-10 DIAGNOSIS — E7800 Pure hypercholesterolemia, unspecified: Secondary | ICD-10-CM

## 2019-07-10 DIAGNOSIS — F3164 Bipolar disorder, current episode mixed, severe, with psychotic features: Secondary | ICD-10-CM

## 2019-07-10 LAB — POCT URINALYSIS DIPSTICK
Blood, UA: NEGATIVE
Glucose, UA: NEGATIVE
Leukocytes, UA: NEGATIVE
Nitrite, UA: NEGATIVE
Protein, UA: POSITIVE — AB
Spec Grav, UA: >=1.03 — AB (ref 1.010–1.025)
Urobilinogen, UA: 0.2 E.U./dL
pH, UA: 6 (ref 5.0–8.0)

## 2019-07-10 MED ORDER — NA SULFATE-K SULFATE-MG SULF 17.5-3.13-1.6 GM/177ML PO SOLN: 1.0000 | Freq: Once | ORAL | 0 refills | Status: AC

## 2019-07-10 MED ORDER — ATORVASTATIN CALCIUM 20 MG PO TABS: 20.0000 mg | ORAL_TABLET | Freq: Every day | ORAL | 3 refills | Status: DC

## 2019-07-10 MED ORDER — AMLODIPINE BESYLATE 10 MG PO TABS: 10.0000 mg | ORAL_TABLET | Freq: Every day | ORAL | 1 refills | Status: DC

## 2019-07-10 NOTE — Progress Notes (Signed)

## 2019-07-10 NOTE — Patient Instructions (Signed)
Healthy Eating Following a healthy eating pattern may help you to achieve and maintain a healthy body weight, reduce the risk of chronic disease, and live a long and productive life. It is important to follow a healthy eating pattern at an appropriate calorie level for your body. Your nutritional needs should be met primarily through food by choosing a variety of nutrient-rich foods. What are tips for following this plan? Reading food labels  Read labels and choose the following: ? Reduced or low sodium. ? Juices with 100% fruit juice. ? Foods with low saturated fats and high polyunsaturated and monounsaturated fats. ? Foods with whole grains, such as whole wheat, cracked wheat, brown rice, and wild rice. ? Whole grains that are fortified with folic acid. This is recommended for women who are pregnant or who want to become pregnant.  Read labels and avoid the following: ? Foods with a lot of added sugars. These include foods that contain brown sugar, corn sweetener, corn syrup, dextrose, fructose, glucose, high-fructose corn syrup, honey, invert sugar, lactose, malt syrup, maltose, molasses, raw sugar, sucrose, trehalose, or turbinado sugar.  Do not eat more than the following amounts of added sugar per day:  6 teaspoons (25 g) for women.  9 teaspoons (38 g) for men. ? Foods that contain processed or refined starches and grains. ? Refined grain products, such as white flour, degermed cornmeal, white bread, and white rice. Shopping  Choose nutrient-rich snacks, such as vegetables, whole fruits, and nuts. Avoid high-calorie and high-sugar snacks, such as potato chips, fruit snacks, and candy.  Use oil-based dressings and spreads on foods instead of solid fats such as butter, stick margarine, or cream cheese.  Limit pre-made sauces, mixes, and "instant" products such as flavored rice, instant noodles, and ready-made pasta.  Try more plant-protein sources, such as tofu, tempeh, black beans,  edamame, lentils, nuts, and seeds.  Explore eating plans such as the Mediterranean diet or vegetarian diet. Cooking  Use oil to saut or stir-fry foods instead of solid fats such as butter, stick margarine, or lard.  Try baking, boiling, grilling, or broiling instead of frying.  Remove the fatty part of meats before cooking.  Steam vegetables in water or broth. Meal planning   At meals, imagine dividing your plate into fourths: ? One-half of your plate is fruits and vegetables. ? One-fourth of your plate is whole grains. ? One-fourth of your plate is protein, especially lean meats, poultry, eggs, tofu, beans, or nuts.  Include low-fat dairy as part of your daily diet. Lifestyle  Choose healthy options in all settings, including home, work, school, restaurants, or stores.  Prepare your food safely: ? Wash your hands after handling raw meats. ? Keep food preparation surfaces clean by regularly washing with hot, soapy water. ? Keep raw meats separate from ready-to-eat foods, such as fruits and vegetables. ? Cook seafood, meat, poultry, and eggs to the recommended internal temperature. ? Store foods at safe temperatures. In general:  Keep cold foods at 59F (4.4C) or below.  Keep hot foods at 159F (60C) or above.  Keep your freezer at [facility] (-17.8C) or below.  Foods are no longer safe to eat when they have been between the temperatures of 40-159F (4.4-60C) for more than 2 hours. What foods should I eat? Fruits Aim to eat 2 cup-equivalents of fresh, canned (in natural juice), or frozen fruits each day. Examples of 1 cup-equivalent of fruit include 1 small apple, 8 large strawberries, 1 cup canned fruit,  cup  dried fruit, or 1 cup 100% juice. Vegetables Aim to eat 2-3 cup-equivalents of fresh and frozen vegetables each day, including different varieties and colors. Examples of 1 cup-equivalent of vegetables include 2 medium carrots, 2 cups raw, leafy greens, 1 cup chopped  vegetable (raw or cooked), or 1 medium baked potato. Grains Aim to eat 6 ounce-equivalents of whole grains each day. Examples of 1 ounce-equivalent of grains include 1 slice of bread, 1 cup ready-to-eat cereal, 3 cups popcorn, or  cup cooked rice, pasta, or cereal. Meats and other proteins Aim to eat 5-6 ounce-equivalents of protein each day. Examples of 1 ounce-equivalent of protein include 1 egg, 1/2 cup nuts or seeds, or 1 tablespoon (16 g) peanut butter. A cut of meat or fish that is the size of a deck of cards is about 3-4 ounce-equivalents.  Of the protein you eat each week, try to have at least 8 ounces come from seafood. This includes salmon, trout, herring, and anchovies. Dairy Aim to eat 3 cup-equivalents of fat-free or low-fat dairy each day. Examples of 1 cup-equivalent of dairy include 1 cup (240 mL) milk, 8 ounces (250 g) yogurt, 1 ounces (44 g) natural cheese, or 1 cup (240 mL) fortified soy milk. Fats and oils  Aim for about 5 teaspoons (21 g) per day. Choose monounsaturated fats, such as canola and olive oils, avocados, peanut butter, and most nuts, or polyunsaturated fats, such as sunflower, corn, and soybean oils, walnuts, pine nuts, sesame seeds, sunflower seeds, and flaxseed. Beverages  Aim for six 8-oz glasses of water per day. Limit coffee to three to five 8-oz cups per day.  Limit caffeinated beverages that have added calories, such as soda and energy drinks.  Limit alcohol intake to no more than 1 drink a day for nonpregnant women and 2 drinks a day for men. One drink equals 12 oz of beer (355 mL), 5 oz of wine (148 mL), or 1 oz of hard liquor (44 mL). Seasoning and other foods  Avoid adding excess amounts of salt to your foods. Try flavoring foods with herbs and spices instead of salt.  Avoid adding sugar to foods.  Try using oil-based dressings, sauces, and spreads instead of solid fats. This information is based on general [place] nutrition guidelines. For more  information, visit BuildDNA.es. Exact amounts may vary based on your nutrition needs. Summary  A healthy eating plan may help you to maintain a healthy weight, reduce the risk of chronic diseases, and stay active throughout your life.  Plan your meals. Make sure you eat the right portions of a variety of nutrient-rich foods.  Try baking, boiling, grilling, or broiling instead of frying.  Choose healthy options in all settings, including home, work, school, restaurants, or stores. This information is not intended to replace advice given to you by your health care provider. Make sure you discuss any questions you have with your health care provider. Document Revised: 08/28/2017 Document Reviewed: 08/28/2017 Elsevier Patient Education  [place]. Acute Knee Pain, Adult Many things can cause knee pain. Sometimes, knee pain is sudden (acute) and may be caused by damage, swelling, or irritation of the muscles and tissues that support your knee. The pain often goes away on its own with time and rest. If the pain does not go away, tests may be done to find out what is causing the pain. Follow these instructions at home: Pay attention to any changes in your symptoms. Take these actions to relieve your pain. If  you have a knee sleeve or brace:   Wear the sleeve or brace as told by your doctor. Remove it only as told by your doctor.  Loosen the sleeve or brace if your toes: ? Tingle. ? Become numb. ? Turn cold and blue.  Keep the sleeve or brace clean.  If the sleeve or brace is not waterproof: ? Do not let it get wet. ? Cover it with a watertight covering when you take a bath or shower. Activity  Rest your knee.  Do not do things that cause pain.  Avoid activities where both feet leave the ground at the same time (high-impact activities). Examples are running, jumping rope, and doing jumping jacks.  Work with a physical therapist to make a safe exercise program, as told  by your doctor. Managing pain, stiffness, and swelling   If told, put ice on the knee: ? Put ice in a plastic bag. ? Place a towel between your skin and the bag. ? Leave the ice on for 20 minutes, 2-3 times a day.  If told, put pressure (compression) on your injured knee to control swelling, give support, and help with discomfort. Compression may be done with an elastic bandage. General instructions  Take all medicines only as told by your doctor.  Raise (elevate) your knee while you are sitting or lying down. Make sure your knee is higher than your heart.  Sleep with a pillow under your knee.  Do not use any products that contain nicotine or tobacco. These include cigarettes, e-cigarettes, and chewing tobacco. These products may slow down healing. If you need help quitting, ask your doctor.  If you are overweight, work with your doctor and a food expert (dietitian) to set goals to lose weight. Being overweight can make your knee hurt more.  Keep all follow-up visits as told by your doctor. This is important. Contact a doctor if:  The knee pain does not stop.  The knee pain changes or gets worse.  You have a fever along with knee pain.  Your knee feels warm when you touch it.  Your knee gives out or locks up. Get help right away if:  Your knee swells, and the swelling gets worse.  You cannot move your knee.  You have very bad knee pain. Summary  Many things can cause knee pain. The pain often goes away on its own with time and rest.  Your doctor may do tests to find out the cause of the pain.  Pay attention to any changes in your symptoms. Relieve your pain with rest, medicines, light activity, and use of ice.  Get help right away if you cannot move your knee or your knee pain is very bad. This information is not intended to replace advice given to you by your health care provider. Make sure you discuss any questions you have with your health care provider. Document  Revised: 10/26/2017 Document Reviewed: 10/26/2017 Elsevier Patient Education  [place].
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
## 2019-07-11 LAB — COMP. METABOLIC PANEL (12)
AST: 18 IU/L (ref 0–40)
Albumin/Globulin Ratio: 1.3 (ref 1.2–2.2)
Albumin: 4.3 g/dL (ref 3.8–4.9)
Alkaline Phosphatase: 62 IU/L (ref 39–117)
BUN/Creatinine Ratio: 13 (ref 9–23)
BUN: 11 mg/dL (ref 6–24)
Bilirubin Total: <0.2 mg/dL (ref 0.0–1.2)
Calcium: 9.6 mg/dL (ref 8.7–10.2)
Chloride: 105 mmol/L (ref 96–106)
Creatinine, Ser: 0.87 mg/dL (ref 0.57–1.00)
GFR calc Af Amer: 86 mL/min/{1.73_m2} (ref >59)
GFR calc non Af Amer: 74 mL/min/{1.73_m2} (ref >59)
Globulin, Total: 3.4 g/dL (ref 1.5–4.5)
Glucose: 89 mg/dL (ref 65–99)
Potassium: 4.5 mmol/L (ref 3.5–5.2)
Sodium: 142 mmol/L (ref 134–144)
Total Protein: 7.7 g/dL (ref 6.0–8.5)

## 2019-07-11 LAB — LIPID PANEL
Chol/HDL Ratio: 2.1 ratio (ref 0.0–4.4)
Cholesterol, Total: 188 mg/dL (ref 100–199)
HDL: 91 mg/dL (ref >39)
LDL Chol Calc (NIH): 83 mg/dL (ref 0–99)
Triglycerides: 74 mg/dL (ref 0–149)
VLDL Cholesterol Cal: 14 mg/dL (ref 5–40)

## 2019-07-11 LAB — MAGNESIUM: Magnesium: 2.1 mg/dL (ref 1.6–2.3)

## 2019-07-11 LAB — VITAMIN B12: Vitamin B-12: 753 pg/mL (ref 232–1245)

## 2019-07-11 LAB — C-REACTIVE PROTEIN: CRP: <1 mg/L (ref 0–10)

## 2019-07-11 LAB — VITAMIN D 25 HYDROXY (VIT D DEFICIENCY, FRACTURES): Vit D, 25-Hydroxy: 52.7 ng/mL (ref 30.0–100.0)

## 2019-07-11 LAB — SEDIMENTATION RATE: Sed Rate: 23 mm/hr (ref 0–40)

## 2019-07-19 ENCOUNTER — Other Ambulatory Visit: Payer: Self-pay | Admitting: Gastroenterology

## 2019-07-19 ENCOUNTER — Other Ambulatory Visit: Payer: Self-pay

## 2019-07-19 ENCOUNTER — Ambulatory Visit (INDEPENDENT_AMBULATORY_CARE_PROVIDER_SITE_OTHER): Payer: Medicare Other

## 2019-07-19 DIAGNOSIS — Z1159 Encounter for screening for other viral diseases: Secondary | ICD-10-CM

## 2019-07-20 LAB — SARS CORONAVIRUS 2 (TAT 6-24 HRS): SARS Coronavirus 2: NEGATIVE

## 2019-07-24 ENCOUNTER — Other Ambulatory Visit: Payer: Self-pay

## 2019-07-24 ENCOUNTER — Encounter: Payer: Self-pay | Admitting: Gastroenterology

## 2019-07-24 ENCOUNTER — Ambulatory Visit (AMBULATORY_SURGERY_CENTER): Payer: Medicare Other | Admitting: Gastroenterology

## 2019-07-24 VITALS — BP 116/66 | HR 68 | Temp 96.9°F | Resp 17 | Ht 63.0 in | Wt 275.0 lb

## 2019-07-24 DIAGNOSIS — Z86010 Personal history of colonic polyps: Secondary | ICD-10-CM | POA: Diagnosis not present

## 2019-07-24 DIAGNOSIS — D123 Benign neoplasm of transverse colon: Secondary | ICD-10-CM

## 2019-07-24 MED ORDER — SODIUM CHLORIDE 0.9 % IV SOLN: 500.0000 mL | Freq: Once | INTRAVENOUS | Status: DC

## 2019-07-24 NOTE — Op Note (Signed)
[facility] Patient Name: Analeyah Aispuro Procedure Date: 07/24/2019 11:32 AM MRN: [id number] Endoscopist: Remo Lipps P. Havery Moros , MD Age: 59 Referring MD:  Date of Birth: 1960/09/14 Gender: Female Account #: [id number] Procedure:                Colonoscopy Indications:              Surveillance: Personal history of 6 adenomatous                            polyps on last colonoscopy 3 years ago Medicines:                Monitored Anesthesia Care Procedure:                Pre-Anesthesia Assessment:                           - Prior to the procedure, a History and Physical                            was performed, and patient medications and                            allergies were reviewed. The patient's tolerance of                            previous anesthesia was also reviewed. The risks                            and benefits of the procedure and the sedation                            options and risks were discussed with the patient.                            All questions were answered, and informed consent                            was obtained. Prior Anticoagulants: The patient has                            taken no previous anticoagulant or antiplatelet                            agents. ASA Grade Assessment: III - A patient with                            severe systemic disease. After reviewing the risks                            and benefits, the patient was deemed in                            satisfactory condition to undergo the procedure.  After obtaining informed consent, the colonoscope                            was passed under direct vision. Throughout the                            procedure, the patient's blood pressure, pulse, and                            oxygen saturations were monitored continuously. The                            Colonoscope was introduced through the anus and                             advanced to the the cecum, identified by                            appendiceal orifice and ileocecal valve. The                            colonoscopy was performed without difficulty. The                            patient tolerated the procedure well. The quality                            of the bowel preparation was good. The ileocecal                            valve, appendiceal orifice, and rectum were                            photographed. Scope In: 11:36:28 AM Scope Out: 11:53:13 AM Scope Withdrawal Time: 0 hours 12 minutes 45 seconds  Total Procedure Duration: 0 hours 16 minutes 45 seconds  Findings:                 The perianal and digital rectal examinations were                            normal.                           Multiple medium-mouthed diverticula were found in                            the entire colon.                           A single small angiodysplastic lesion was found in                            the ascending colon.  A 3 mm polyp was found in the hepatic flexure. The                            polyp was sessile. The polyp was removed with a                            cold snare. Resection and retrieval were complete.                           The exam was otherwise without abnormality. There                            was benign appearing extrinsic compression of the                            sigmoid colon Complications:            No immediate complications. Estimated blood loss:                            Minimal. Estimated Blood Loss:     Estimated blood loss was minimal. Impression:               - Diverticulosis in the entire examined colon.                           - A single colonic angiodysplastic lesion.                           - One 3 mm polyp at the hepatic flexure, removed                            with a cold snare. Resected and retrieved.                           - The examination was otherwise  normal. Recommendation:           - Patient has a contact number available for                            emergencies. The signs and symptoms of potential                            delayed complications were discussed with the                            patient. Return to normal activities tomorrow.                            Written discharge instructions were provided to the                            patient.                           -  Resume previous diet.                           - Continue present medications.                           - Await pathology results. Anticipate repeat                            surveillance colonoscopy in 5 years Carlota Raspberry. Connar Keating, MD 07/24/2019 11:59:05 AM This report has been signed electronically.

## 2019-07-24 NOTE — Patient Instructions (Signed)
Read all of the handouts given to you by your recovery room nurse.  Thank-you for choosing [place] for your healthcare needs today.  YOU HAD AN ENDOSCOPIC PROCEDURE TODAY AT [facility]:   Refer to the procedure report that was given to you for any specific questions about what was found during the examination.  If the procedure report does not answer your questions, please call your gastroenterologist to clarify.  If you requested that your care partner not be given the details of your procedure findings, then the procedure report has been included in a sealed envelope for you to review at your convenience later.  YOU SHOULD EXPECT: Some feelings of bloating in the abdomen. Passage of more gas than usual.  Walking can help get rid of the air that was put into your GI tract during the procedure and reduce the bloating. If you had a lower endoscopy (such as a colonoscopy or flexible sigmoidoscopy) you may notice spotting of blood in your stool or on the toilet paper. If you underwent a bowel prep for your procedure, you may not have a normal bowel movement for a few days.  Please Note:  You might notice some irritation and congestion in your nose or some drainage.  This is from the oxygen used during your procedure.  There is no need for concern and it should clear up in a day or so.  SYMPTOMS TO REPORT IMMEDIATELY:   Following lower endoscopy (colonoscopy or flexible sigmoidoscopy):  Excessive amounts of blood in the stool  Significant tenderness or worsening of abdominal pains  Swelling of the abdomen that is new, acute  Fever of 100F or higher  For urgent or emergent issues, a gastroenterologist can be reached at any hour by calling [phone].   DIET:  We do recommend a small meal at first, but then you may proceed to your regular diet.  Drink plenty of fluids but you should avoid alcoholic beverages for 24 hours.  Try to increase the fiber in your diet, and drink plenty of  fiber.  ACTIVITY:  You should plan to take it easy for the rest of today and you should NOT DRIVE or use heavy machinery until tomorrow (because of the sedation medicines used during the test).    FOLLOW UP: Our staff will call the number listed on your records 48-72 hours following your procedure to check on you and address any questions or concerns that you may have regarding the information given to you following your procedure. If we do not reach you, we will leave a message.  We will attempt to reach you two times.  During this call, we will ask if you have developed any symptoms of COVID 19. If you develop any symptoms (ie: fever, flu-like symptoms, shortness of breath, cough etc.) before then, please call [phone].  If you test positive for Covid 19 in the 2 weeks post procedure, please call and report this information to [place].    If any biopsies were taken you will be contacted by phone or by letter within the next 1-3 weeks.  Please call us at [phone] if you have not heard about the biopsies in 3 weeks.    SIGNATURES/CONFIDENTIALITY: You and/or your care partner have signed paperwork which will be entered into your electronic medical record.  These signatures attest to the fact that that the information above on your After Visit Summary has been reviewed and is understood.  Full responsibility of the confidentiality  of this discharge information lies with you and/or your care-partner.

## 2019-07-24 NOTE — Progress Notes (Signed)
Called to room to assist during endoscopic procedure.  Patient ID and intended procedure confirmed with present staff. Received instructions for my participation in the procedure from the performing physician.  

## 2019-07-24 NOTE — Progress Notes (Signed)
Pt Drowsy. VSS. To PACU, report to RN. No anesthetic complications noted.  

## 2019-07-24 NOTE — Progress Notes (Signed)
Pt's states no medical or surgical changes since previsit or office visit.  Temp- [place]

## 2019-07-26 ENCOUNTER — Telehealth: Payer: Self-pay | Admitting: *Deleted

## 2019-07-26 NOTE — Telephone Encounter (Signed)
Attempted f/u phone call. No answer. Left message. °

## 2019-07-26 NOTE — Telephone Encounter (Signed)
Attempted second f/u phone call. No answer. Left message.  

## 2019-07-30 ENCOUNTER — Encounter: Payer: Self-pay | Admitting: Gastroenterology

## 2019-08-12 ENCOUNTER — Telehealth: Payer: Self-pay | Admitting: Nurse Practitioner

## 2019-08-12 DIAGNOSIS — I10 Essential (primary) hypertension: Secondary | ICD-10-CM

## 2019-08-12 MED ORDER — CARVEDILOL 3.125 MG PO TABS: 3.1250 mg | ORAL_TABLET | Freq: Every day | ORAL | 1 refills | Status: DC

## 2019-08-12 NOTE — Telephone Encounter (Signed)
Refill sent to pharmacy. Thanks.

## 2019-08-12 NOTE — Telephone Encounter (Signed)
Pt has no refills on carvedidol, but has changed pharmacies and new pharmacy says they cant request a refill on something they don't have record of. Therefore, they need [place] to send it in. Her new pharmacy is [facility] on [address].

## 2019-08-15 ENCOUNTER — Telehealth: Payer: Self-pay | Admitting: Nurse Practitioner

## 2019-08-15 NOTE — Telephone Encounter (Signed)
error 

## 2019-08-27 ENCOUNTER — Ambulatory Visit: Payer: Medicare Other | Admitting: Family Medicine

## 2019-09-23 ENCOUNTER — Ambulatory Visit (INDEPENDENT_AMBULATORY_CARE_PROVIDER_SITE_OTHER): Payer: Medicare Other | Admitting: Cardiology

## 2019-09-23 ENCOUNTER — Encounter: Payer: Self-pay | Admitting: Cardiology

## 2019-09-23 ENCOUNTER — Other Ambulatory Visit: Payer: Self-pay

## 2019-09-23 VITALS — BP 130/82 | HR 68 | Temp 96.4°F | Ht 64.0 in | Wt 275.0 lb

## 2019-09-23 DIAGNOSIS — E66813 Obesity, class 3: Secondary | ICD-10-CM

## 2019-09-23 DIAGNOSIS — Z7189 Other specified counseling: Secondary | ICD-10-CM | POA: Diagnosis not present

## 2019-09-23 DIAGNOSIS — I10 Essential (primary) hypertension: Secondary | ICD-10-CM | POA: Diagnosis not present

## 2019-09-23 DIAGNOSIS — Z7182 Exercise counseling: Secondary | ICD-10-CM

## 2019-09-23 DIAGNOSIS — E7800 Pure hypercholesterolemia, unspecified: Secondary | ICD-10-CM | POA: Diagnosis not present

## 2019-09-23 DIAGNOSIS — Z6841 Body Mass Index (BMI) 40.0 and over, adult: Secondary | ICD-10-CM

## 2019-09-23 DIAGNOSIS — Z713 Dietary counseling and surveillance: Secondary | ICD-10-CM | POA: Diagnosis not present

## 2019-09-23 NOTE — Patient Instructions (Signed)
Medication Instructions:  Your Physician recommend you continue on your current medication as directed.    *If you need a refill on your cardiac medications before your next appointment, please call your pharmacy*   Lab Work: None   Testing/Procedures: None   Follow-Up: At [facility], you and your health needs are our priority.  As part of our continuing mission to provide you with exceptional heart care, we have created designated Provider Care Teams.  These Care Teams include your primary Cardiologist (physician) and Advanced Practice Providers (APPs -  Physician Assistants and Nurse Practitioners) who all work together to provide you with the care you need, when you need it.  We recommend signing up for the patient portal called "MyChart".  Sign up information is provided on this After Visit Summary.  MyChart is used to connect with patients for Virtual Visits (Telemedicine).  Patients are able to view lab/test results, encounter notes, upcoming appointments, etc.  Non-urgent messages can be sent to your provider as well.   To learn more about what you can do with MyChart, go to [email or web address].    Your next appointment:   2 year(s)  The format for your next appointment:   In Person  Provider:   Bridgette Christopher, MD     

## 2019-09-23 NOTE — Progress Notes (Signed)
Cardiology Office Note:    Date:  09/23/2019   ID:  Stacie Daugherty, DOB 07-13-1960, MRN [id number]  PCP:  Vevelyn Francois, NP  Cardiologist:  Buford Dresser, MD  Referring MD: Vevelyn Francois, NP   CC: follow up  History of Present Illness:    Stacie Daugherty is a 59 y.o. female with a hx of hypertension, hyperlipidemia, obesity, osteoarthritis, bipolar disorder, depression, anxiety who is seen for follow up. I initially met her 03/25/19  as a new consult at the request of Vevelyn Francois, NP for the evaluation and management of hypertension, indication for stress test.  Today: Had been working out with a trainer, but this has been on hold the last 4 weeks due to an illness in the trainer's family. Had been going well prior to that. Was doing elliptical and treadmill for 15-20 minutes and then did weights. Working on improving diet, eating salads but still gets takeout occasionally.   Chronic mild leg edema is unchanged. Has occasional pain in her upper thighs at night or crossing her legs. Not with activity.   Denies chest pain, shortness of breath at rest or with normal exertion. Is hard to exercise with mask on but  She copes ok with this.  No PND, orthopnea, new LE edema or unexpected weight gain. No syncope or palpitations.  Past Medical History:  Diagnosis Date  . Allergy    mild  . Anxiety    bipolar   . Arthritis    both knees  . Bipolar 1 disorder ([place])    Hospitalized multiple times for bipolar disorder ([facility] - [facility], [facility], and [facility], most recently in [place])  . Depression    bipolar  . Hidradenitis suppurativa   . Hypertension     Past Surgical History:  Procedure Laterality Date  . BREAST BIOPSY Left 2012   benign  . COLOSTOMY  07/06/2015  . INDUCED ABORTION     in patient's 55s in [place], [place].  . WISDOM TOOTH EXTRACTION      Current Medications: Current Outpatient  Medications on File Prior to Visit  Medication Sig  . ABILIFY MAINTENA 400 MG PRSY prefilled syringe SMARTSIG:100 Milligram(s) IM Every 4 Weeks  . amLODipine (NORVASC) 10 MG tablet Take 1 tablet (10 mg total) by mouth daily. for high blood pressure  . atorvastatin (LIPITOR) 20 MG tablet Take 1 tablet (20 mg total) by mouth daily.  . carbamazepine (TEGRETOL) 200 MG tablet Take 1 tablet (200 mg total) by mouth 2 (two) times daily at 8 am and 10 pm.  . carvedilol (COREG) 3.125 MG tablet Take 1 tablet (3.125 mg total) by mouth daily.  . Cholecalciferol (VITAMIN D3) 25 MCG (1000 UT) CAPS Take 1,000 Units by mouth daily.  . divalproex (DEPAKOTE) 500 MG DR tablet Take 500 mg by mouth 2 (two) times daily.  . ergocalciferol (VITAMIN D2) 1.25 MG (50000 UT) capsule Take 50,000 Units by mouth once a week.  . gabapentin (NEURONTIN) 300 MG capsule Take 1 capsule (300 mg total) by mouth 3 (three) times daily at 8am, 2pm and bedtime. For agitation  . MELATONIN PO Take 1 tablet by mouth at bedtime.  . Multiple Vitamin (MULTIVITAMIN PO) Take by mouth.  . traZODone (DESYREL) 50 MG tablet TAKE 1 TABLET BY MOUTH EVERY DAY AT BEDTIME AS NEEDED  . vitamin C (ASCORBIC ACID) 500 MG tablet Take 500 mg by mouth daily.   No current facility-administered medications  on file prior to visit.     Allergies:   Haldol [haloperidol lactate]   Social History   Tobacco Use  . Smoking status: Former Smoker    Types: Cigarettes  . Smokeless tobacco: Never Used  . Tobacco comment: Smoked few cigarettes, socially, "did not inhale"  Substance Use Topics  . Alcohol use: No    Comment: Social alcohol use when younger  . Drug use: No    Comment: Social marijuana use when younger    Family History: family history includes Bipolar disorder in her mother; Cancer in her father; Colon cancer in her maternal grandfather; Depression in her mother; Hypertension in her maternal grandfather and paternal grandmother; Pulmonary embolism  in her mother. There is no history of Colon polyps, Stomach cancer, Rectal cancer, Breast cancer, or Esophageal cancer. mom had chronic heart failure, had pulmonary issues/PE. Grandmother died age 75, unclear if heart disease or old age. No early cardiac death, no MI that she knows of. Brother (who is physician) had mild stroke.   ROS:   Please see the history of present illness.  Additional pertinent ROS otherwise unremarkable.   EKGs/Labs/Other Studies Reviewed:    The following studies were reviewed today: ETT 05/02/19  Blood pressure demonstrated a hypertensive response to exercise.  There was no ST segment deviation noted during stress.  No T wave inversion was noted during stress.   Hypertensive response to exercise. There is 1 mm ST depression in V3-V6 at baseline that "pseudonormalizes" with exercise. Suspect changes may be related to left ventricular hypertrophy, but due to baseline abnormalities cannot exclude coronary insufficiency. Further evaluation with a more specific (imaging) method may be indicated.  Echo 2016 - Left ventricle: The cavity size was normal. There was mild  concentric hypertrophy. Systolic function was vigorous. The  estimated ejection fraction was in the range of 65% to 70%. There  was dynamic obstruction in the outflow tract, with a peak  velocity of 148 cm/sec and a peak gradient of 9 mm Hg. Wall  motion was normal; there were no regional wall motion  abnormalities.  - Mitral valve: There was mild regurgitation.  - Left atrium: The atrium was mildly dilated.  - Pulmonary arteries: Systolic pressure was moderately increased.  PA peak pressure: 47 mm Hg (S).   EKG:  EKG is personally reviewed.  The ekg ordered today demonstrates NSR with HR 72 bpm.  Recent Labs: 12/04/2018: ALT 15 07/10/2019: BUN 11; Creatinine, Ser 0.87; Magnesium 2.1; Potassium 4.5; Sodium 142  Recent Lipid Panel    Component Value Date/Time   CHOL 188  07/10/2019 1237   TRIG 74 07/10/2019 1237   HDL 91 07/10/2019 1237   CHOLHDL 2.1 07/10/2019 1237   CHOLHDL 2.0 03/31/2017 0904   VLDL 26 07/12/2016 0855   LDLCALC 83 07/10/2019 1237   LDLCALC 64 03/31/2017 0904    Physical Exam:    VS:  BP 130/82   Pulse 68   Temp (!) 96.4 F (35.8 C)   Ht _0  (1.626 m)   Wt 275 lb (124.7 kg)   SpO2 95%   BMI 47.20 kg/m     Wt Readings from Last 3 Encounters:  09/23/19 275 lb (124.7 kg)  07/24/19 275 lb (124.7 kg)  07/10/19 274 lb (124.3 kg)    GEN: Well nourished, well developed in no acute distress HEENT: Normal, moist mucous membranes NECK: No JVD CARDIAC: regular rhythm, normal S1 and S2, no rubs or gallops. No murmur. VASCULAR: Radial and  DP pulses 2+ bilaterally. No carotid bruits RESPIRATORY:  Clear to auscultation without rales, wheezing or rhonchi  ABDOMEN: Soft, non-tender, non-distended MUSCULOSKELETAL:  Ambulates independently SKIN: Warm and dry, no pitting edema but mild nonpitting edema at ankles NEUROLOGIC:  Alert and oriented x 3. No focal neuro deficits noted. PSYCHIATRIC:  Normal affect   ASSESSMENT:    1. HYPERCHOLESTEROLEMIA   2. Essential hypertension   3. Exercise counseling   4. Nutritional counseling   5. Cardiac risk counseling   6. Class 3 severe obesity due to excess calories with serious comorbidity and body mass index (BMI) of 45.0 to 49.9 in adult [facility])    PLAN:    Poor exercise tolerance: working on this with a trainer, had been much improved. No chest pain.  Hypercholesterolemia:  -reviewed lipids from 07/10/19. HDL 91, LDL 116, TG 74 -continue atorvastatin  Hypertension: -continue amlodipine 10 mg daily, carvedilol 3.125 mg BID.  Obesity, class 3: BMI 47. Working on weight loss with diet and exercise  Cardiac risk counseling and prevention recommendations: -recommend heart healthy/Mediterranean diet, with whole grains, fruits, vegetable, fish, lean meats, nuts, and olive oil. Limit  salt. -recommend moderate walking, 3-5 times/week for 30-50 minutes each session. Aim for at least 150 minutes.week. Goal should be pace of 3 miles/hours, or walking 1.5 miles in 30 minutes -recommend avoidance of tobacco products. Avoid excess alcohol. -Additional risk factor control:  -Diabetes risk: A1c is 5.3, monitor -ASCVD risk score: The 10-year ASCVD risk score Mikey Bussing DC Brooke Bonito., et al., 2013) is: 3.7%   Values used to calculate the score:     Age: 62 years     Sex: Female     Is Non-Hispanic African American: Yes     Diabetic: No     Tobacco smoker: No     Systolic Blood Pressure: 867 mmHg     Is BP treated: Yes     HDL Cholesterol: 91 mg/dL     Total Cholesterol: 188 mg/dL    Plan for follow up: every 2 years or sooner as needed.  Medication Adjustments/Labs and Tests Ordered: Current medicines are reviewed at length with the patient today.  Concerns regarding medicines are outlined above.  No orders of the defined types were placed in this encounter.  No orders of the defined types were placed in this encounter.   Patient Instructions  Medication Instructions:  Your Physician recommend you continue on your current medication as directed.    *If you need a refill on your cardiac medications before your next appointment, please call your pharmacy*   Lab Work: None   Testing/Procedures: None   Follow-Up: At [facility], you and your health needs are our priority.  As part of our continuing mission to provide you with exceptional heart care, we have created designated Provider Care Teams.  These Care Teams include your primary Cardiologist (physician) and Advanced Practice Providers (APPs -  Physician Assistants and Nurse Practitioners) who all work together to provide you with the care you need, when you need it.  We recommend signing up for the patient portal called "MyChart".  Sign up information is provided on this After Visit Summary.  MyChart is used to connect  with patients for Virtual Visits (Telemedicine).  Patients are able to view lab/test results, encounter notes, upcoming appointments, etc.  Non-urgent messages can be sent to your provider as well.   To learn more about what you can do with MyChart, go to [email or web address].    Your next  appointment:   2 year(s)  The format for your next appointment:   In Person  Provider:   Buford Dresser, MD      Signed, Buford Dresser, MD PhD 09/23/2019 4:40 PM    [place]

## 2019-10-09 ENCOUNTER — Encounter: Payer: Self-pay | Admitting: Nurse Practitioner

## 2019-10-09 ENCOUNTER — Ambulatory Visit (INDEPENDENT_AMBULATORY_CARE_PROVIDER_SITE_OTHER): Payer: Medicare Other | Admitting: Nurse Practitioner

## 2019-10-09 ENCOUNTER — Other Ambulatory Visit: Payer: Self-pay

## 2019-10-09 VITALS — BP 145/60 | HR 77 | Temp 98.4°F | Ht 64.0 in | Wt 277.0 lb

## 2019-10-09 DIAGNOSIS — I10 Essential (primary) hypertension: Secondary | ICD-10-CM

## 2019-10-09 DIAGNOSIS — Z6841 Body Mass Index (BMI) 40.0 and over, adult: Secondary | ICD-10-CM | POA: Diagnosis not present

## 2019-10-09 LAB — POCT URINALYSIS DIPSTICK
Bilirubin, UA: NEGATIVE
Blood, UA: NEGATIVE
Glucose, UA: NEGATIVE
Ketones, UA: NEGATIVE
Leukocytes, UA: NEGATIVE
Nitrite, UA: NEGATIVE
Protein, UA: NEGATIVE
Spec Grav, UA: 1.02 (ref 1.010–1.025)
Urobilinogen, UA: 0.2 E.U./dL
pH, UA: 6 (ref 5.0–8.0)
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
## 2019-10-09 NOTE — Patient Instructions (Signed)

## 2019-10-17 ENCOUNTER — Encounter: Payer: Self-pay | Admitting: Nurse Practitioner

## 2019-10-17 ENCOUNTER — Other Ambulatory Visit: Payer: Self-pay

## 2019-10-17 ENCOUNTER — Ambulatory Visit (INDEPENDENT_AMBULATORY_CARE_PROVIDER_SITE_OTHER): Payer: Medicare Other | Admitting: Nurse Practitioner

## 2019-10-17 VITALS — BP 132/82 | HR 73 | Temp 98.3°F | Ht 64.0 in | Wt 275.4 lb

## 2019-10-17 DIAGNOSIS — T300 Burn of unspecified body region, unspecified degree: Secondary | ICD-10-CM

## 2019-10-17 DIAGNOSIS — Z1231 Encounter for screening mammogram for malignant neoplasm of breast: Secondary | ICD-10-CM

## 2019-10-17 DIAGNOSIS — Z0000 Encounter for general adult medical examination without abnormal findings: Secondary | ICD-10-CM

## 2019-10-17 DIAGNOSIS — Z124 Encounter for screening for malignant neoplasm of cervix: Secondary | ICD-10-CM | POA: Diagnosis not present

## 2019-10-17 NOTE — Patient Instructions (Signed)

## 2019-10-17 NOTE — Progress Notes (Signed)
Subjective:     Stacie Daugherty is a 59 y.o. woman who comes in today for a  pap smear only. Her most recent annual exam was on 2018. Her most recent Pap smear was on normal and showed no abnormalities. Previous abnormal Pap smears: no. Contraception: none  The following portions of the patient's history were reviewed and updated as appropriate: allergies, current medications, past family history, past medical history, past social history, past surgical history and problem list.  Review of Systems Pertinent items are noted in HPI. SKIN: left forearm blister from grease pop while searing chicken   Objective:    BP 132/82   Pulse 73   Temp 98.3 F (36.8 C)   Ht 5\' 4"  (1.626 m)   Wt 275 lb 6.4 oz (124.9 kg)   SpO2 97%   BMI 47.27 kg/m    GEN:AAO In NAD Breasts: breasts appear normal, no suspicious masses, no skin or nipple changes or axillary nodes. Pelvic Exam: cervix normal in appearance, exam obscured by obesity, external genitalia normal, no cervical motion tenderness and vagina normal without discharge. Pap smear obtained.   Skin:. left forearm skin tone blister No s/s of infection  Assessment:    Screening pap smear.   Plan:    Follow up in 1 year, or as indicated by Pap results.

## 2019-10-18 DIAGNOSIS — H35033 Hypertensive retinopathy, bilateral: Secondary | ICD-10-CM | POA: Diagnosis not present

## 2019-10-18 DIAGNOSIS — H0102B Squamous blepharitis left eye, upper and lower eyelids: Secondary | ICD-10-CM | POA: Diagnosis not present

## 2019-10-18 DIAGNOSIS — H11132 Conjunctival pigmentations, left eye: Secondary | ICD-10-CM | POA: Diagnosis not present

## 2019-10-18 DIAGNOSIS — H0288A Meibomian gland dysfunction right eye, upper and lower eyelids: Secondary | ICD-10-CM | POA: Diagnosis not present

## 2019-10-18 DIAGNOSIS — H0102A Squamous blepharitis right eye, upper and lower eyelids: Secondary | ICD-10-CM | POA: Diagnosis not present

## 2019-10-18 LAB — IGP, RFX APTIMA HPV ASCU

## 2019-10-29 ENCOUNTER — Encounter: Payer: Self-pay | Admitting: Sports Medicine

## 2019-10-29 ENCOUNTER — Other Ambulatory Visit: Payer: Self-pay

## 2019-10-29 ENCOUNTER — Ambulatory Visit (INDEPENDENT_AMBULATORY_CARE_PROVIDER_SITE_OTHER): Payer: Medicare Other | Admitting: Sports Medicine

## 2019-10-29 DIAGNOSIS — M2141 Flat foot [pes planus] (acquired), right foot: Secondary | ICD-10-CM

## 2019-10-29 DIAGNOSIS — M25471 Effusion, right ankle: Secondary | ICD-10-CM

## 2019-10-29 DIAGNOSIS — M25472 Effusion, left ankle: Secondary | ICD-10-CM

## 2019-10-29 DIAGNOSIS — L853 Xerosis cutis: Secondary | ICD-10-CM

## 2019-10-29 DIAGNOSIS — I739 Peripheral vascular disease, unspecified: Secondary | ICD-10-CM | POA: Diagnosis not present

## 2019-10-29 DIAGNOSIS — M2142 Flat foot [pes planus] (acquired), left foot: Secondary | ICD-10-CM

## 2019-10-29 NOTE — Patient Instructions (Signed)
Recommend New balance, Vionic, or Sketchers for shoes

## 2019-10-29 NOTE — Progress Notes (Signed)
Subjective: Stacie Daugherty is a 59 y.o. female patient who presents to office for foot exam and shoe recommendations and advice on swelling in both feet and ankles. Denies any pain bedsides swelling. No other issues.  Patient Active Problem List   Diagnosis Date Noted  . Proteinuria 07/10/2019  . Unilateral primary osteoarthritis, left knee 09/13/2016  . Encounter for gynecological examination without abnormal finding 07/27/2016  . Sprain of medial collateral ligament of left knee 06/07/2016  . Acute pain of left knee 06/02/2016  . Bipolar disorder, curr episode mixed, severe, with psychotic features ([place]) 08/26/2015  . History of posttraumatic stress disorder (PTSD) 08/26/2015  . Essential hypertension 08/26/2015  . History of eczema 04/08/2015  . Pedal edema 08/06/2014  . Positive ANA (antinuclear antibody) 12/19/2012  . Biological false positive RPR test 08/18/2012  . HYPERCHOLESTEROLEMIA 04/28/2009  . HIDRADENITIS SUPPURATIVA 09/06/2007  . OBESITY 04/17/2007    Current Outpatient Medications on File Prior to Visit  Medication Sig Dispense Refill  . ABILIFY MAINTENA 400 MG PRSY prefilled syringe SMARTSIG:100 Milligram(s) IM Every 4 Weeks    . amLODipine (NORVASC) 10 MG tablet Take 1 tablet (10 mg total) by mouth daily. for high blood pressure 90 tablet 1  . atorvastatin (LIPITOR) 20 MG tablet Take 1 tablet (20 mg total) by mouth daily. 90 tablet 3  . carbamazepine (TEGRETOL) 200 MG tablet Take 1 tablet (200 mg total) by mouth 2 (two) times daily at 8 am and 10 pm. 60 tablet 0  . carvedilol (COREG) 3.125 MG tablet Take 1 tablet (3.125 mg total) by mouth daily. 90 tablet 1  . Cholecalciferol (VITAMIN D3) 25 MCG (1000 UT) CAPS Take 1,000 Units by mouth daily.    . divalproex (DEPAKOTE) 500 MG DR tablet Take 500 mg by mouth 2 (two) times daily.    Marland Kitchen gabapentin (NEURONTIN) 300 MG capsule Take 1 capsule (300 mg total) by mouth 3 (three) times daily at 8am, 2pm and  bedtime. For agitation 90 capsule 0  . MELATONIN PO Take 1 tablet by mouth at bedtime.    . Multiple Vitamin (MULTIVITAMIN PO) Take by mouth.    . SODIUM FLUORIDE 5000 PPM 1.1 % PSTE     . vitamin C (ASCORBIC ACID) 500 MG tablet Take 500 mg by mouth daily.     No current facility-administered medications on file prior to visit.    Allergies  Allergen Reactions  . Haldol [Haloperidol Lactate] Other (See Comments)    Pt  Just doesn't like because she cant remember anything the next day.     Objective:  General: Alert and oriented x3 in no acute distress  Dermatology: No open lesions bilateral lower extremities, no webspace macerations, no ecchymosis bilateral, all nails x 10 are well manicured. Dry skin plantar surfaces bilateral.   Vascular: Dorsalis Pedis and Posterior Tibial pedal pulses palpable, Capillary Fill Time 3 seconds,(+) pedal hair growth bilateral, Trace edema bilateral lower extremities, Temperature gradient within normal limits.  Neurology: Gross sensation intact via light touch bilateral.   Musculoskeletal: No tenderness to palpation bilateral. + Pes planus bilateral. Strength within normal limits in all groups bilateral.   Assessment and Plan: Problem List Items Addressed This Visit    None    Visit Diagnoses    PVD (peripheral vascular disease) ([place])    -  Primary   Ankle edema, bilateral       Pes planus of both feet       Dry skin           -  Complete examination performed -Discussed recommendations for swelling and shoes -Recommend patient to wear compression stocking that she already has at home and advised patient that if swelling continues to discuss with PCP adding on Lasix or diuretic -Recommend daily skin emollients for dry skin -Recommend new balance, vionic, and sketchers shoes  -Patient to return to office as needed or sooner if condition worsens.  Landis Martins, DPM

## 2019-11-26 ENCOUNTER — Encounter: Payer: Medicare Other | Attending: Nurse Practitioner | Admitting: Registered"

## 2019-11-26 ENCOUNTER — Encounter: Payer: Self-pay | Admitting: Registered"

## 2019-11-26 ENCOUNTER — Other Ambulatory Visit: Payer: Self-pay

## 2019-11-26 DIAGNOSIS — Z713 Dietary counseling and surveillance: Secondary | ICD-10-CM | POA: Diagnosis not present

## 2019-11-26 NOTE — Progress Notes (Signed)
  Medical Nutrition Therapy:  Appt start time: 10:13 end time:  11:17.   Assessment:  Primary concerns today: States she is having weight issues. States she is trying to lose some weight. Has a psychiatrist: Maretta Los (NP at [facility]).  Pt expectations: none stated  States she was supposed to working out with trainer but hasn't in a while due to trainer having death in her family. Reports training was exhausting for her, yet good and enjoyed working with trainer. Reports she has membership at [employer] and [employer]. States she wants partner to workout with.   States lately she has been eating too much junk food. States she is not a good cook. Eats a lot of salads. Also like fish, popcorn shrimp, [place] food, and [place] food.  Likes going to park downtown, going to plays/movies, and going to museums. States she is on disability. Watches tv as a past time. Son will be leaving to attend college in this fall. Reports she is already empty nesting.    Preferred Learning Style:   No preference indicated   Learning Readiness:   Ready  Change in progress   MEDICATIONS: See list   DIETARY INTAKE:  Usual eating pattern includes 3 meals and 1 snacks per day.  Everyday foods include cereal, salad, food from [employer].  Avoided foods include eggs, whole milk (possibly lactose intolerant), coconut, and beets.    24-hr recall:  B (10 AM): cereal (Cinnamom Toast Crunch) + almond milk  Snk ( AM):  L (4 PM): Stephanie's - fried shrimp and fish box with fries Snk (5 PM): Rice Krispies treat D (9 PM): tortilla chips + salad (vegetables, raisins) + catalina dressing Snk ( PM):  Beverages: almond milk, water (60 oz), apple juice   Usual physical activity: none reported  Estimated energy needs: 1600 calories 180 g carbohydrates 120 g protein 44 g fat  Progress Towards Goal(s):  In progress.   Nutritional Diagnosis:  NB-1.1 Food and nutrition-related knowledge deficit As  related to balanced meals.  As evidenced by dietary recall.    Intervention: Nutrition education and counseling. Pt was educated on the benefits of eating a variety of food groups at each meal. Discussed the purpose of each food group and ways to create balance with already established regimen. Discussed eating every 3-5 hours to help adequately nourish body and ways to increase water intake. Discussed importance of physical activity and having a mental health provider. Pt was in agreement with goals listed.  Goals: - Contact mental health professional for counseling.  - Increase vegetable intake. Have with each meal.  - Snack options can be fruit + peanut butter or fruit + nuts or peanut butter crackers or trail mix, etc.  - Continue to have 3 meals a day.  - Increase movement. Find a class at the local [employer] to participate in at least once a week or walk around park downtown [place].   Teaching Method Utilized:  Visual Auditory Hands on  Handouts given during visit include:  Local Mental Health Resources  Barriers to learning/adherence to lifestyle change: contemplative stage of change  Demonstrated degree of understanding via:  Teach Back   Monitoring/Evaluation:  Dietary intake, exercise, and body weight in 2 month(s).

## 2019-11-26 NOTE — Patient Instructions (Addendum)
-   Contact mental health professional for counseling.   - Increase vegetable intake. Have with each meal.   - Snack options can be fruit + peanut butter or fruit + nuts or peanut butter crackers or trail mix, etc.   - Continue to have 3 meals a day.   - Increase movement. Find a class at the local [employer] to participate in at least once a week or walk around park downtown [place].

## 2019-11-27 ENCOUNTER — Ambulatory Visit
Admission: RE | Admit: 2019-11-27 | Discharge: 2019-11-27 | Disposition: A | Discharge status: Home / Self Care | Payer: Medicare Other | Source: Ambulatory Visit | Attending: Nurse Practitioner | Admitting: Nurse Practitioner

## 2019-11-27 DIAGNOSIS — Z1231 Encounter for screening mammogram for malignant neoplasm of breast: Secondary | ICD-10-CM | POA: Diagnosis not present

## 2020-01-09 ENCOUNTER — Encounter: Payer: Medicare Other | Admitting: Nurse Practitioner

## 2020-01-13 ENCOUNTER — Other Ambulatory Visit: Payer: Self-pay

## 2020-01-13 ENCOUNTER — Ambulatory Visit (INDEPENDENT_AMBULATORY_CARE_PROVIDER_SITE_OTHER): Payer: Medicare Other | Admitting: Nurse Practitioner

## 2020-01-13 VITALS — HR 76 | Temp 97.7°F | Ht 64.0 in | Wt 282.0 lb

## 2020-01-13 DIAGNOSIS — R600 Localized edema: Secondary | ICD-10-CM

## 2020-01-13 DIAGNOSIS — Z0000 Encounter for general adult medical examination without abnormal findings: Secondary | ICD-10-CM

## 2020-01-13 DIAGNOSIS — F3164 Bipolar disorder, current episode mixed, severe, with psychotic features: Secondary | ICD-10-CM | POA: Diagnosis not present

## 2020-01-13 DIAGNOSIS — I10 Essential (primary) hypertension: Secondary | ICD-10-CM | POA: Diagnosis not present

## 2020-01-13 DIAGNOSIS — Z6841 Body Mass Index (BMI) 40.0 and over, adult: Secondary | ICD-10-CM

## 2020-01-13 LAB — POCT URINALYSIS DIPSTICK OB
Bilirubin, UA: NEGATIVE
Blood, UA: NEGATIVE
Glucose, UA: NEGATIVE
Leukocytes, UA: NEGATIVE
Spec Grav, UA: 1.025 (ref 1.010–1.025)
Urobilinogen, UA: 0.2 E.U./dL
pH, UA: 6.5 (ref 5.0–8.0)

## 2020-01-13 LAB — POCT GLYCOSYLATED HEMOGLOBIN (HGB A1C)
HbA1c POC (<> result, manual entry): 5.5 % (ref 4.0–5.6)
HbA1c, POC (prediabetic range): 5.5 % — AB (ref 5.7–6.4)
Hemoglobin A1C: 5.5 % (ref 4.0–5.6)

## 2020-01-13 MED ORDER — LOSARTAN POTASSIUM-HCTZ 50-12.5 MG PO TABS
1.0000 | ORAL_TABLET | Freq: Every day | ORAL | 3 refills | Status: DC
Start: 2020-01-13 — End: 2020-10-14
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
## 2020-01-14 ENCOUNTER — Encounter: Payer: Self-pay | Admitting: Nurse Practitioner

## 2020-01-14 LAB — COMP. METABOLIC PANEL (12)
AST: 22 IU/L (ref 0–40)
Albumin/Globulin Ratio: 1.4 (ref 1.2–2.2)
Albumin: 4.2 g/dL (ref 3.8–4.9)
Alkaline Phosphatase: 63 IU/L (ref 48–121)
BUN/Creatinine Ratio: 15 (ref 9–23)
BUN: 13 mg/dL (ref 6–24)
Bilirubin Total: <0.2 mg/dL (ref 0.0–1.2)
Calcium: 9.6 mg/dL (ref 8.7–10.2)
Chloride: 103 mmol/L (ref 96–106)
Creatinine, Ser: 0.84 mg/dL (ref 0.57–1.00)
GFR calc Af Amer: 89 mL/min/{1.73_m2} (ref >59)
GFR calc non Af Amer: 77 mL/min/{1.73_m2} (ref >59)
Globulin, Total: 3 g/dL (ref 1.5–4.5)
Glucose: 90 mg/dL (ref 65–99)
Potassium: 4.4 mmol/L (ref 3.5–5.2)
Sodium: 141 mmol/L (ref 134–144)
Total Protein: 7.2 g/dL (ref 6.0–8.5)

## 2020-01-14 LAB — MICROALBUMIN, URINE: Microalbumin, Urine: 16 ug/mL

## 2020-01-14 LAB — BRAIN NATRIURETIC PEPTIDE: BNP: 70.8 pg/mL (ref 0.0–100.0)

## 2020-01-15 ENCOUNTER — Telehealth: Payer: Self-pay | Admitting: Nurse Practitioner

## 2020-01-15 NOTE — Telephone Encounter (Signed)
Amlodipine is discontinued ; Start Losartan/HCT

## 2020-01-16 NOTE — Telephone Encounter (Signed)
Called informed patient of message

## 2020-02-04 ENCOUNTER — Ambulatory Visit: Payer: Medicare Other | Admitting: Registered"

## 2020-02-06 ENCOUNTER — Ambulatory Visit (INDEPENDENT_AMBULATORY_CARE_PROVIDER_SITE_OTHER): Payer: Medicare Other | Admitting: Sports Medicine

## 2020-02-06 ENCOUNTER — Encounter: Payer: Self-pay | Admitting: Sports Medicine

## 2020-02-06 ENCOUNTER — Other Ambulatory Visit: Payer: Self-pay

## 2020-02-06 DIAGNOSIS — I739 Peripheral vascular disease, unspecified: Secondary | ICD-10-CM | POA: Diagnosis not present

## 2020-02-06 DIAGNOSIS — M2142 Flat foot [pes planus] (acquired), left foot: Secondary | ICD-10-CM | POA: Diagnosis not present

## 2020-02-06 DIAGNOSIS — M2141 Flat foot [pes planus] (acquired), right foot: Secondary | ICD-10-CM

## 2020-02-06 DIAGNOSIS — M722 Plantar fascial fibromatosis: Secondary | ICD-10-CM

## 2020-02-06 DIAGNOSIS — L603 Nail dystrophy: Secondary | ICD-10-CM | POA: Diagnosis not present

## 2020-02-06 DIAGNOSIS — B351 Tinea unguium: Secondary | ICD-10-CM | POA: Diagnosis not present

## 2020-02-06 NOTE — Progress Notes (Signed)
Subjective: Stacie Daugherty is a 59 y.o. female patient who returns to office for pain in heels reports that she is dealing with a flare up and reports that she is concerned wit discoloration at all nails and debris. No other issues.  Patient Active Problem List   Diagnosis Date Noted  . Proteinuria 07/10/2019  . Unilateral primary osteoarthritis, left knee 09/13/2016  . Encounter for gynecological examination without abnormal finding 07/27/2016  . Sprain of medial collateral ligament of left knee 06/07/2016  . Acute pain of left knee 06/02/2016  . Bipolar disorder, curr episode mixed, severe, with psychotic features ([place]) 08/26/2015  . History of posttraumatic stress disorder (PTSD) 08/26/2015  . Essential hypertension 08/26/2015  . History of eczema 04/08/2015  . Pedal edema 08/06/2014  . Positive ANA (antinuclear antibody) 12/19/2012  . Biological false positive RPR test 08/18/2012  . HYPERCHOLESTEROLEMIA 04/28/2009  . HIDRADENITIS SUPPURATIVA 09/06/2007  . OBESITY 04/17/2007    Current Outpatient Medications on File Prior to Visit  Medication Sig Dispense Refill  . ABILIFY MAINTENA 400 MG PRSY prefilled syringe SMARTSIG:100 Milligram(s) IM Every 4 Weeks    . atorvastatin (LIPITOR) 20 MG tablet Take 1 tablet (20 mg total) by mouth daily. 90 tablet 3  . carbamazepine (TEGRETOL) 200 MG tablet Take 1 tablet (200 mg total) by mouth 2 (two) times daily at 8 am and 10 pm. 60 tablet 0  . carvedilol (COREG) 3.125 MG tablet Take 1 tablet (3.125 mg total) by mouth daily. 90 tablet 1  . chlorhexidine (PERIDEX) 0.12 % solution 15 mLs 2 (two) times daily.    . Cholecalciferol (VITAMIN D3) 25 MCG (1000 UT) CAPS Take 1,000 Units by mouth daily.    . divalproex (DEPAKOTE) 500 MG DR tablet Take 500 mg by mouth 2 (two) times daily.    Marland Kitchen gabapentin (NEURONTIN) 300 MG capsule Take 1 capsule (300 mg total) by mouth 3 (three) times daily at 8am, 2pm and bedtime. For agitation 90 capsule  0  . losartan-hydrochlorothiazide (HYZAAR) 50-12.5 MG tablet Take 1 tablet by mouth daily. 90 tablet 3  . MELATONIN PO Take 1 tablet by mouth at bedtime.    . Multiple Vitamin (MULTIVITAMIN PO) Take by mouth.    . SODIUM FLUORIDE 5000 PPM 1.1 % PSTE     . vitamin C (ASCORBIC ACID) 500 MG tablet Take 500 mg by mouth daily.     No current facility-administered medications on file prior to visit.    Allergies  Allergen Reactions  . Haldol [Haloperidol Lactate] Other (See Comments)    Pt  Just doesn't like because she cant remember anything the next day.     Objective:  General: Alert and oriented x3 in no acute distress  Dermatology: No open lesions bilateral lower extremities, no webspace macerations, no ecchymosis bilateral, all nails x 10 are elongated and thick with subungal debris consistent with onychomycosis. Dry skin plantar surfaces bilateral.   Vascular: Dorsalis Pedis and Posterior Tibial pedal pulses palpable, Capillary Fill Time 3 seconds,(+) pedal hair growth bilateral, Trace edema bilateral lower extremities, Temperature gradient within normal limits.  Neurology: Johney Maine sensation intact via light touch bilateral.   Musculoskeletal:+  tenderness to palpation bilateral heels at plantar fascia insertion. + Pes planus bilateral. Strength within normal limits in all groups bilateral.   Assessment and Plan: Problem List Items Addressed This Visit    None    Visit Diagnoses    Onychodystrophy    -  Primary   Relevant Orders  Culture, fungus without smear   PVD (peripheral vascular disease) (HCC)       Pes planus of both feet       Plantar fasciitis, bilateral           -Complete examination performed -Discussed recommendations for flare of plantar fasciitis -After oral consent and aseptic prep, injected a mixture containing 1 ml of 2%  plain lidocaine, 1 ml 0.5% plain marcaine, 0.5 ml of kenalog 10 and 0.5 ml of dexamethasone phosphate into right and left foot  bilateral at plantar fascia insertion without complication. Post-injection care discussed with patient.  -Recommend gentle stretching and icing as instructed -Fungal culture was obtained by removing a portion of the hard nail itself from each of the involved toenails using a sterile nail nipper and sent to [facility] lab. Patient tolerated the biopsy procedure well without discomfort or need for anesthesia. -Patient to return to office in 4 weeks for fungal culture results or sooner if condition worsens.  Landis Martins, DPM

## 2020-02-20 ENCOUNTER — Telehealth: Payer: Self-pay | Admitting: Nurse Practitioner

## 2020-02-20 ENCOUNTER — Other Ambulatory Visit: Payer: Self-pay | Admitting: Nurse Practitioner

## 2020-02-20 DIAGNOSIS — I10 Essential (primary) hypertension: Secondary | ICD-10-CM

## 2020-02-20 MED ORDER — CARVEDILOL 3.125 MG PO TABS: 3.1250 mg | ORAL_TABLET | Freq: Every day | ORAL | 3 refills | Status: DC

## 2020-02-20 NOTE — Telephone Encounter (Signed)
Sent!

## 2020-02-26 ENCOUNTER — Other Ambulatory Visit: Payer: Self-pay

## 2020-02-26 ENCOUNTER — Encounter: Payer: Self-pay | Admitting: Nurse Practitioner

## 2020-02-26 ENCOUNTER — Ambulatory Visit (INDEPENDENT_AMBULATORY_CARE_PROVIDER_SITE_OTHER): Payer: Medicare Other | Admitting: Nurse Practitioner

## 2020-02-26 VITALS — BP 149/99 | HR 70 | Temp 97.2°F | Resp 20 | Ht 64.0 in | Wt 282.0 lb

## 2020-02-26 DIAGNOSIS — R109 Unspecified abdominal pain: Secondary | ICD-10-CM | POA: Diagnosis not present

## 2020-02-26 DIAGNOSIS — I10 Essential (primary) hypertension: Secondary | ICD-10-CM | POA: Diagnosis not present

## 2020-02-26 NOTE — Patient Instructions (Signed)
   Managing Your Hypertension Hypertension is commonly called high blood pressure. This is when the force of your blood pressing against the walls of your arteries is too strong. Arteries are blood vessels that carry blood from your heart throughout your body. Hypertension forces the heart to work harder to pump blood, and may cause the arteries to become narrow or stiff. Having untreated or uncontrolled hypertension can cause heart attack, stroke, kidney disease, and other problems. What are blood pressure readings? A blood pressure reading consists of a higher number over a lower number. Ideally, your blood pressure should be below 120/80. The first ("top") number is called the systolic pressure. It is a measure of the pressure in your arteries as your heart beats. The second ("bottom") number is called the diastolic pressure. It is a measure of the pressure in your arteries as the heart relaxes. What does my blood pressure reading mean? Blood pressure is classified into four stages. Based on your blood pressure reading, your health care provider may use the following stages to determine what type of treatment you need, if any. Systolic pressure and diastolic pressure are measured in a unit called mm Hg. Normal  Systolic pressure: below 120.  Diastolic pressure: below 80. Elevated  Systolic pressure: 120-129.  Diastolic pressure: below 80. Hypertension stage 1  Systolic pressure: 130-139.  Diastolic pressure: 80-89. Hypertension stage 2  Systolic pressure: 140 or above.  Diastolic pressure: 90 or above. What health risks are associated with hypertension? Managing your hypertension is an important responsibility. Uncontrolled hypertension can lead to:  A heart attack.  A stroke.  A weakened blood vessel (aneurysm).  Heart failure.  Kidney damage.  Eye damage.  Metabolic syndrome.  Memory and concentration problems. What changes can I make to manage my  hypertension? Hypertension can be managed by making lifestyle changes and possibly by taking medicines. Your health care provider will help you make a plan to bring your blood pressure within a normal range. Eating and drinking   Eat a diet that is high in fiber and potassium, and low in salt (sodium), added sugar, and fat. An example eating plan is called the DASH (Dietary Approaches to Stop Hypertension) diet. To eat this way: ? Eat plenty of fresh fruits and vegetables. Try to fill half of your plate at each meal with fruits and vegetables. ? Eat whole grains, such as whole wheat pasta, brown rice, or whole grain bread. Fill about one quarter of your plate with whole grains. ? Eat low-fat diary products. ? Avoid fatty cuts of meat, processed or cured meats, and poultry with skin. Fill about one quarter of your plate with lean proteins such as fish, chicken without skin, beans, eggs, and tofu. ? Avoid premade and processed foods. These tend to be higher in sodium, added sugar, and fat.  Reduce your daily sodium intake. Most people with hypertension should eat less than 1,500 mg of sodium a day.  Limit alcohol intake to no more than 1 drink a day for nonpregnant women and 2 drinks a day for men. One drink equals 12 oz of beer, 5 oz of wine, or 1 oz of hard liquor. Lifestyle  Work with your health care provider to maintain a healthy body weight, or to lose weight. Ask what an ideal weight is for you.  Get at least 30 minutes of exercise that causes your heart to beat faster (aerobic exercise) most days of the week. Activities may include walking, swimming, or biking.    Include exercise to strengthen your muscles (resistance exercise), such as weight lifting, as part of your weekly exercise routine. Try to do these types of exercises for 30 minutes at least 3 days a week.  Do not use any products that contain nicotine or tobacco, such as cigarettes and e-cigarettes. If you need help quitting,  ask your health care provider.  Control any long-term (chronic) conditions you have, such as high cholesterol or diabetes. Monitoring  Monitor your blood pressure at home as told by your health care provider. Your personal target blood pressure may vary depending on your medical conditions, your age, and other factors.  Have your blood pressure checked regularly, as often as told by your health care provider. Working with your health care provider  Review all the medicines you take with your health care provider because there may be side effects or interactions.  Talk with your health care provider about your diet, exercise habits, and other lifestyle factors that may be contributing to hypertension.  Visit your health care provider regularly. Your health care provider can help you create and adjust your plan for managing hypertension. Will I need medicine to control my blood pressure? Your health care provider may prescribe medicine if lifestyle changes are not enough to get your blood pressure under control, and if:  Your systolic blood pressure is 130 or higher.  Your diastolic blood pressure is 80 or higher. Take medicines only as told by your health care provider. Follow the directions carefully. Blood pressure medicines must be taken as prescribed. The medicine does not work as well when you skip doses. Skipping doses also puts you at risk for problems. Contact a health care provider if:  You think you are having a reaction to medicines you have taken.  You have repeated (recurrent) headaches.  You feel dizzy.  You have swelling in your ankles.  You have trouble with your vision. Get help right away if:  You develop a severe headache or confusion.  You have unusual weakness or numbness, or you feel faint.  You have severe pain in your chest or abdomen.  You vomit repeatedly.  You have trouble breathing. Summary  Hypertension is when the force of blood pumping  through your arteries is too strong. If this condition is not controlled, it may put you at risk for serious complications.  Your personal target blood pressure may vary depending on your medical conditions, your age, and other factors. For most people, a normal blood pressure is less than 120/80.  Hypertension is managed by lifestyle changes, medicines, or both. Lifestyle changes include weight loss, eating a healthy, low-sodium diet, exercising more, and limiting alcohol. This information is not intended to replace advice given to you by your health care provider. Make sure you discuss any questions you have with your health care provider. Document Revised: 09/07/2018 Document Reviewed: 04/13/2016 Elsevier Patient Education  2020 Elsevier Inc.  
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
## 2020-03-12 ENCOUNTER — Other Ambulatory Visit: Payer: Self-pay

## 2020-03-12 ENCOUNTER — Ambulatory Visit (INDEPENDENT_AMBULATORY_CARE_PROVIDER_SITE_OTHER): Payer: Medicare Other | Admitting: Sports Medicine

## 2020-03-12 ENCOUNTER — Encounter: Payer: Self-pay | Admitting: Sports Medicine

## 2020-03-12 DIAGNOSIS — Z79899 Other long term (current) drug therapy: Secondary | ICD-10-CM | POA: Diagnosis not present

## 2020-03-12 DIAGNOSIS — I739 Peripheral vascular disease, unspecified: Secondary | ICD-10-CM

## 2020-03-12 DIAGNOSIS — B351 Tinea unguium: Secondary | ICD-10-CM | POA: Diagnosis not present

## 2020-03-12 DIAGNOSIS — L853 Xerosis cutis: Secondary | ICD-10-CM

## 2020-03-12 DIAGNOSIS — M722 Plantar fascial fibromatosis: Secondary | ICD-10-CM

## 2020-03-12 DIAGNOSIS — M2141 Flat foot [pes planus] (acquired), right foot: Secondary | ICD-10-CM | POA: Diagnosis not present

## 2020-03-12 DIAGNOSIS — M2142 Flat foot [pes planus] (acquired), left foot: Secondary | ICD-10-CM

## 2020-03-12 NOTE — Progress Notes (Signed)
Subjective: Stacie Daugherty is a 59 y.o. female patient seen today in office for fungal culture results and follow-up evaluation of bilateral heel pain reports that there is no pain in the left and slight pain on the right previous injections were very helpful. Patient has no other pedal complaints at this time.   Patient Active Problem List   Diagnosis Date Noted  . Proteinuria 07/10/2019  . Unilateral primary osteoarthritis, left knee 09/13/2016  . Encounter for gynecological examination without abnormal finding 07/27/2016  . Sprain of medial collateral ligament of left knee 06/07/2016  . Acute pain of left knee 06/02/2016  . Bipolar disorder, curr episode mixed, severe, with psychotic features ([place]) 08/26/2015  . History of posttraumatic stress disorder (PTSD) 08/26/2015  . Essential hypertension 08/26/2015  . History of eczema 04/08/2015  . Pedal edema 08/06/2014  . Positive ANA (antinuclear antibody) 12/19/2012  . Biological false positive RPR test 08/18/2012  . HYPERCHOLESTEROLEMIA 04/28/2009  . HIDRADENITIS SUPPURATIVA 09/06/2007  . OBESITY 04/17/2007    Current Outpatient Medications on File Prior to Visit  Medication Sig Dispense Refill  . ABILIFY MAINTENA 400 MG PRSY prefilled syringe SMARTSIG:100 Milligram(s) IM Every 4 Weeks    . atorvastatin (LIPITOR) 20 MG tablet Take 1 tablet (20 mg total) by mouth daily. 90 tablet 3  . carbamazepine (TEGRETOL) 200 MG tablet Take 1 tablet (200 mg total) by mouth 2 (two) times daily at 8 am and 10 pm. 60 tablet 0  . carvedilol (COREG) 3.125 MG tablet Take 1 tablet (3.125 mg total) by mouth daily. 90 tablet 3  . chlorhexidine (PERIDEX) 0.12 % solution 15 mLs 2 (two) times daily.    . Cholecalciferol (VITAMIN D3) 25 MCG (1000 UT) CAPS Take 1,000 Units by mouth daily.    . divalproex (DEPAKOTE) 500 MG DR tablet Take 500 mg by mouth 2 (two) times daily.    Marland Kitchen gabapentin (NEURONTIN) 300 MG capsule Take 1 capsule (300 mg  total) by mouth 3 (three) times daily at 8am, 2pm and bedtime. For agitation 90 capsule 0  . losartan-hydrochlorothiazide (HYZAAR) 50-12.5 MG tablet Take 1 tablet by mouth daily. 90 tablet 3  . MELATONIN PO Take 1 tablet by mouth at bedtime.    . Multiple Vitamin (MULTIVITAMIN PO) Take by mouth.    . SODIUM FLUORIDE 5000 PPM 1.1 % PSTE     . vitamin C (ASCORBIC ACID) 500 MG tablet Take 500 mg by mouth daily.     No current facility-administered medications on file prior to visit.    Allergies  Allergen Reactions  . Haldol [Haloperidol Lactate] Other (See Comments)    Pt  Just doesn't like because she cant remember anything the next day.     Objective: Physical Exam  General: Well developed, nourished, no acute distress, awake, alert and oriented x 3  Dermatology: No open lesions bilateral lower extremities, no webspace macerations, no ecchymosis bilateral, all nails x 10 are elongated and thick with subungal debris consistent with onychomycosis. Dry skin plantar surfaces bilateral.   Vascular: Dorsalis Pedis and Posterior Tibial pedal pulses palpable, Capillary Fill Time 3 seconds,(+) pedal hair growth bilateral, Trace edema bilateral lower extremities, Temperature gradient within normal limits.  Neurology: Johney Maine sensation intact via light touch bilateral.   Musculoskeletal:+  Decreased tenderness to palpation bilateral heels at plantar fascia insertion. + Pes planus bilateral. Strength within normal limits in all groups bilateral.  al.  Fungal culture suggestive of T rubrum  Assessment and Plan:  Problem List  Items Addressed This Visit    None    Visit Diagnoses    Long-term use of high-risk medication    -  Primary   Relevant Orders   Hepatic Function Panel   Onychomycosis       Relevant Orders   Hepatic Function Panel   PVD (peripheral vascular disease) (HCC)       Pes planus of both feet       Plantar fasciitis, bilateral       Dry skin          -Examined  patient -Discussed treatment options for painful mycotic nails -At no additional charge mechanically debrided nails x10 -Patient opt for oral ltracoanzole with full understanding of medication risks; ordered LFTs for review if within normal limits will proceed with sending Rx to pharmacy  -Advised good hygiene habits -Advised continue with daily skin emollients for dry skin -Advised patient to continue with daily stretching for plantar fasciitis -Patient to return in 6 weeks for follow up evaluation or sooner if symptoms worsen and as scheduled for nail trims with Dr. Elisha Ponder.  Landis Martins, DPM

## 2020-03-13 ENCOUNTER — Other Ambulatory Visit: Payer: Self-pay | Admitting: Sports Medicine

## 2020-03-13 LAB — HEPATIC FUNCTION PANEL
AG Ratio: 1.2 (calc) (ref 1.0–2.5)
ALT: 14 U/L (ref 6–29)
AST: 16 U/L (ref 10–35)
Albumin: 4 g/dL (ref 3.6–5.1)
Alkaline phosphatase (APISO): 52 U/L (ref 37–153)
Bilirubin, Direct: 0.1 mg/dL (ref 0.0–0.2)
Globulin: 3.4 g/dL (calc) (ref 1.9–3.7)
Indirect Bilirubin: 0.3 mg/dL (calc) (ref 0.2–1.2)
Total Bilirubin: 0.4 mg/dL (ref 0.2–1.2)
Total Protein: 7.4 g/dL (ref 6.1–8.1)

## 2020-03-13 MED ORDER — ITRACONAZOLE 200 MG PO TABS: ORAL_TABLET | ORAL | 0 refills | Status: DC

## 2020-03-13 NOTE — Progress Notes (Signed)
LFTs normal. Sent itraconazole to pharmacy. -Dr. Cannon Kettle

## 2020-03-17 ENCOUNTER — Ambulatory Visit: Payer: Medicare Other | Admitting: Orthopaedic Surgery

## 2020-03-17 DIAGNOSIS — M1812 Unilateral primary osteoarthritis of first carpometacarpal joint, left hand: Secondary | ICD-10-CM | POA: Diagnosis not present

## 2020-03-17 DIAGNOSIS — M79644 Pain in right finger(s): Secondary | ICD-10-CM | POA: Diagnosis not present

## 2020-03-17 DIAGNOSIS — S63682A Other sprain of left thumb, initial encounter: Secondary | ICD-10-CM | POA: Diagnosis not present

## 2020-03-17 DIAGNOSIS — M79642 Pain in left hand: Secondary | ICD-10-CM | POA: Diagnosis not present

## 2020-03-24 ENCOUNTER — Telehealth: Payer: Self-pay | Admitting: Sports Medicine

## 2020-03-24 NOTE — Telephone Encounter (Signed)
I completed the PA online via cover my meds on 10/20. She needs to follow up with pharmacy or her drug prescription plan with determination on if they will cover this medication. I have submitted all paperwork and explanation and the status on my end says: Received meaning that they have received my documentation Thanks Dr. Chauncey Cruel

## 2020-03-24 NOTE — Telephone Encounter (Signed)
Patient called stating the pharmacy will not fill the prescription until the paperwork has been submitted for prior authorization, per patient the insurance company Is in need of explanation as to why patient needs medicine and will not approve until they receive all required documentation

## 2020-03-30 NOTE — Telephone Encounter (Signed)
The patient called [employer] and they said they did not receive the PA. They have faxed it over again. She also said that [facility] would take the PA over the phone as well.

## 2020-03-30 NOTE — Telephone Encounter (Signed)
Ok I will look for the fax again. I already completed it online but I will fill out the paper version as well when they send the fax Thanks Dr. Chauncey Cruel

## 2020-03-31 ENCOUNTER — Telehealth: Payer: Self-pay | Admitting: Sports Medicine

## 2020-03-31 NOTE — Telephone Encounter (Signed)
Patient called in stating she has prior authorization number and would like for you to contact so patient can get medicine that was prescribed back from September visit, per patient  Prior Authorization [id number]  Please Advise

## 2020-03-31 NOTE — Telephone Encounter (Signed)
FYI I called to follow up. Her insurance is still processing the Prior Auth that was submitted. It can take 3-7 days before we can hear back anything, Below is the call reference # [id number] Thanks Dr. Chauncey Cruel

## 2020-03-31 NOTE — Telephone Encounter (Signed)
Patient called in stating she has prior authorization number and would like for you to contact so patient can get medicine that was prescribed back from September visit, per patient  Prior Authorization [id number]  Please Advise...   I have already sent message to Cannon Kettle but this patient has been waiting on prescription for over a month

## 2020-04-07 DIAGNOSIS — M79642 Pain in left hand: Secondary | ICD-10-CM | POA: Diagnosis not present

## 2020-04-07 DIAGNOSIS — M79644 Pain in right finger(s): Secondary | ICD-10-CM | POA: Diagnosis not present

## 2020-04-10 ENCOUNTER — Telehealth: Payer: Self-pay | Admitting: Sports Medicine

## 2020-04-10 NOTE — Telephone Encounter (Signed)
Yes please let pharmacy know that they can fill the Rx Thanks Dr. Chauncey Cruel

## 2020-04-10 NOTE — Telephone Encounter (Signed)
Pharmacy called stating pt takes atorvastatin (TEGRETOL) 20 mg  from another physician. This medication interacts with the itraconazole 200 MG by causing possible muscle pain. They would like to know if you still want them to fill this Rx. Call back number: [phone]. Please advise.

## 2020-04-15 ENCOUNTER — Encounter: Payer: Self-pay | Admitting: Nurse Practitioner

## 2020-04-15 ENCOUNTER — Other Ambulatory Visit: Payer: Self-pay

## 2020-04-15 ENCOUNTER — Ambulatory Visit (INDEPENDENT_AMBULATORY_CARE_PROVIDER_SITE_OTHER): Payer: Medicare Other | Admitting: Nurse Practitioner

## 2020-04-15 VITALS — BP 138/83 | HR 75 | Temp 98.2°F | Resp 20 | Ht 64.0 in | Wt 288.0 lb

## 2020-04-15 DIAGNOSIS — R5383 Other fatigue: Secondary | ICD-10-CM

## 2020-04-15 DIAGNOSIS — I10 Essential (primary) hypertension: Secondary | ICD-10-CM | POA: Diagnosis not present

## 2020-04-15 DIAGNOSIS — F3164 Bipolar disorder, current episode mixed, severe, with psychotic features: Secondary | ICD-10-CM

## 2020-04-15 DIAGNOSIS — Z6841 Body Mass Index (BMI) 40.0 and over, adult: Secondary | ICD-10-CM
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
## 2020-04-15 NOTE — Patient Instructions (Signed)

## 2020-04-16 LAB — COMP. METABOLIC PANEL (12)
AST: 14 IU/L (ref 0–40)
Albumin/Globulin Ratio: 1.3 (ref 1.2–2.2)
Albumin: 4.1 g/dL (ref 3.8–4.9)
Alkaline Phosphatase: 64 IU/L (ref 44–121)
BUN/Creatinine Ratio: 15 (ref 9–23)
BUN: 13 mg/dL (ref 6–24)
Bilirubin Total: <0.2 mg/dL (ref 0.0–1.2)
Calcium: 9.6 mg/dL (ref 8.7–10.2)
Chloride: 102 mmol/L (ref 96–106)
Creatinine, Ser: 0.88 mg/dL (ref 0.57–1.00)
GFR calc Af Amer: 84 mL/min/{1.73_m2} (ref >59)
GFR calc non Af Amer: 73 mL/min/{1.73_m2} (ref >59)
Globulin, Total: 3.2 g/dL (ref 1.5–4.5)
Glucose: 96 mg/dL (ref 65–99)
Potassium: 4.4 mmol/L (ref 3.5–5.2)
Sodium: 143 mmol/L (ref 134–144)
Total Protein: 7.3 g/dL (ref 6.0–8.5)

## 2020-04-16 LAB — CBC WITH DIFFERENTIAL/PLATELET
Basophils Absolute: 0 10*3/uL (ref 0.0–0.2)
Basos: 0 %
EOS (ABSOLUTE): 0.2 10*3/uL (ref 0.0–0.4)
Eos: 3 %
Hematocrit: 39.2 % (ref 34.0–46.6)
Hemoglobin: 13.6 g/dL (ref 11.1–15.9)
Immature Grans (Abs): 0 10*3/uL (ref 0.0–0.1)
Immature Granulocytes: 0 %
Lymphocytes Absolute: 2.1 10*3/uL (ref 0.7–3.1)
Lymphs: 38 %
MCH: 32.4 pg (ref 26.6–33.0)
MCHC: 34.7 g/dL (ref 31.5–35.7)
MCV: 93 fL (ref 79–97)
Monocytes Absolute: 0.6 10*3/uL (ref 0.1–0.9)
Monocytes: 10 %
Neutrophils Absolute: 2.8 10*3/uL (ref 1.4–7.0)
Neutrophils: 49 %
Platelets: 241 10*3/uL (ref 150–450)
RBC: 4.2 x10E6/uL (ref 3.77–5.28)
RDW: 12.4 % (ref 11.7–15.4)
WBC: 5.7 10*3/uL (ref 3.4–10.8)

## 2020-04-16 LAB — TSH: TSH: 0.774 u[IU]/mL (ref 0.450–4.500)

## 2020-04-27 ENCOUNTER — Other Ambulatory Visit: Payer: Self-pay | Admitting: Nurse Practitioner

## 2020-04-27 DIAGNOSIS — R29898 Other symptoms and signs involving the musculoskeletal system: Secondary | ICD-10-CM

## 2020-04-27 DIAGNOSIS — M79604 Pain in right leg: Secondary | ICD-10-CM

## 2020-05-21 ENCOUNTER — Other Ambulatory Visit: Payer: Self-pay | Admitting: Nurse Practitioner

## 2020-05-21 ENCOUNTER — Ambulatory Visit: Payer: Medicare Other | Attending: Nurse Practitioner | Admitting: Physical Therapy

## 2020-05-21 ENCOUNTER — Encounter: Payer: Self-pay | Admitting: Physical Therapy

## 2020-05-21 ENCOUNTER — Encounter: Payer: Self-pay | Admitting: Nurse Practitioner

## 2020-05-21 ENCOUNTER — Other Ambulatory Visit: Payer: Self-pay

## 2020-05-21 DIAGNOSIS — M79662 Pain in left lower leg: Secondary | ICD-10-CM | POA: Insufficient documentation

## 2020-05-21 DIAGNOSIS — M6281 Muscle weakness (generalized): Secondary | ICD-10-CM | POA: Diagnosis not present

## 2020-05-21 DIAGNOSIS — G8929 Other chronic pain: Secondary | ICD-10-CM

## 2020-05-21 DIAGNOSIS — M25562 Pain in left knee: Secondary | ICD-10-CM | POA: Diagnosis not present

## 2020-05-21 NOTE — Therapy (Signed)
[place]ac[place], [place], [address] Phone: [phone]   Fax:  [phone]  Physical Therapy Evaluation  Patient Details  Name: Stacie Daugherty MRN: [id number] Date of Birth: [birth year] Referring Provider (PT): Dionisio David, NP   Encounter Date: 05/21/2020   PT End of Session - 05/21/20 1205    Visit Number 1    Number of Visits 12    Date for PT Re-Evaluation 07/02/20    Authorization Type [employer] MCR/MCD secondary, progress note by visit 10, recheck FOTO at visit 6 and 10    PT Start Time 1102    PT Stop Time 1146    PT Time Calculation (min) 44 min    Activity Tolerance Patient tolerated treatment well    Behavior During Therapy [facility] for tasks assessed/performed           Past Medical History:  Diagnosis Date  . Allergy    mild  . Anxiety    bipolar   . Arthritis    both knees  . Bipolar 1 disorder ([place])    Hospitalized multiple times for bipolar disorder ([facility] - [facility], [facility], and [facility], most recently in [place])  . Depression    bipolar  . Hidradenitis suppurativa   . Hypertension     Past Surgical History:  Procedure Laterality Date  . BREAST BIOPSY Left 2012   benign  . COLOSTOMY  07/06/2015  . INDUCED ABORTION     in patient's 40s in [place], [place].  . WISDOM TOOTH EXTRACTION      There were no vitals filed for this visit.    Subjective Assessment - 05/21/20 1108    Subjective Pt. is a 59 y/o female referred to PT for right leg pain and weakness (referral for right leg but pt. reports she told MD the wrong side and symptoms are on left). Symptoms of insidious onset beginning around early August of this year. She reports difficulty getting up from low seats and states it feels like her leg is "twisting" when she gets up and also notes weakness in her leg. She also notes difficulty with stairs and car transfers, pain is less when  walking after initial steps. She has history of knee pain with OA with past PT for a few visits in 2020. She chart copy of X-rays from 1/18 which showed 5 mm subluxation of left femur on tibia. Pain extends from knee into upper anterior shin region. No LE parasthesias or proximal leg pain noted.    Pertinent History knee OA, obesity, history bipolar/depression    Limitations Standing;Lifting;Walking;House hold activities    Diagnostic tests X-rays for knees 2018    Patient Stated Goals Get leg/knee better and improve ability for transfers and navigating stairs to home entrance    Currently in Pain? No/denies              [facility] PT Assessment - 05/21/20 0001      Assessment   Medical Diagnosis LLE pain and weakness   referral for right leg but pt. reports symptoms on left   Referring Provider (PT) Dionisio David, NP    Onset Date/Surgical Date 01/02/20    Hand Dominance Right    Prior Therapy past PT for knee x 3-4 visits in 2020      Precautions   Precautions None      Restrictions   Weight Bearing Restrictions No      Balance Screen  Has the patient fallen in the past 6 months Yes    How many times? [place] residence    Living Arrangements Children    Type of [place] to enter    Entrance Stairs-Number of Steps 2    Entrance Stairs-Rails None    Home Layout One level    [place] - single point      Prior Function   Level of Independence Independent with basic ADLs;Independent with community mobility without device      Cognition   Overall Cognitive Status Within Functional Limits for tasks assessed      Observation/Other Assessments   Focus on Therapeutic Outcomes (FOTO)  42% limited      Sensation   Light Touch Appears Intact   bilat. L2-S2 dermamtomes     Deep Tendon Reflexes   DTR Assessment Site Patella;Achilles    Patella DTR 1+    Achilles DTR 2+      ROM / Strength    AROM / PROM / Strength AROM;Strength      AROM   Overall AROM Comments Bilateral hip and ankle AROM grossly WFL    AROM Assessment Site Ankle;Knee;Hip    Right/Left Knee Right;Left    Right Knee Extension 6   -6 deg from neutral   Right Knee Flexion 109    Left Knee Extension 4   -4 deg from neutral   Left Knee Flexion 104      Strength   Strength Assessment Site Knee;Hip;Ankle    Right/Left Hip Right;Left    Right Hip Flexion 4-/5    Right Hip External Rotation  4/5    Right Hip Internal Rotation 4+/5    Left Hip Flexion 3+/5    Left Hip External Rotation 4-/5    Left Hip Internal Rotation 4/5    Right/Left Knee Right;Left    Right Knee Flexion 5/5    Right Knee Extension 5/5    Left Knee Flexion 4+/5    Left Knee Extension 4+/5    Right/Left Ankle Right;Left    Right Ankle Dorsiflexion 5/5    Right Ankle Inversion 4/5    Right Ankle Eversion 4/5    Left Ankle Inversion 4/5    Left Ankle Eversion 4/5      Flexibility   Soft Tissue Assessment /Muscle Length --   tight piriformis bilat., tight hamstrings SLR 70 deg bilat.     Palpation   Palpation comment left knee medial joint line tenderness, no tenderness noted in left tibilais anterior/proximal shin region      Special Tests   Other special tests SLR (-) bilat., McMurray's (-)      Transfers   Five time sit to stand comments  12 seconds      Ambulation/Gait   Gait Comments Pt. ambulates independently in clinic wthout AD with decreased terminal knee extension at initial contact and decreased toe off bilaterally                      Objective measurements completed on examination: See above findings.       [facility] Adult PT Treatment/Exercise - 05/21/20 0001      Exercises   Exercises --   HEP handout review                 PT Education - 05/21/20 1205  Education Details potential symptom etiology, HEPissued green Theraband, POC    Person(s) Educated Patient    Methods  Explanation;Demonstration;Verbal cues;Handout    Comprehension Verbalized understanding;Returned demonstration               PT Long Term Goals - 05/21/20 1212      PT LONG TERM GOAL #1   Title Independent with HEP    Baseline instructed at eval    Time 6    Period Weeks    Status New    Target Date 07/02/20      PT LONG TERM GOAL #2   Title Pt. will improve FOTO score to 30% or less impairment    Baseline 42% limited    Time 6    Period Weeks    Status New    Target Date 07/02/20      PT LONG TERM GOAL #3   Title Increase left knee flexion AROM to 110 deg or greater to improve ability for transfers from low seats and for stair navigation    Baseline 104 deg    Time 6    Period Weeks    Status New    Target Date 07/02/20      PT LONG TERM GOAL #4   Title Increase left knee strength to 5/5 to improve ability to navigate steps at [facility] entrance and to improve ability for transfers from low seats    Baseline 4+/5    Status New    Target Date 07/02/20      PT LONG TERM GOAL #5   Title Perform sit<>stand transfers from couch with left knee pain symptoms decreased 50% or greater form baseline status    Baseline difficulty due to pain and weakness    Time 6    Period Weeks    Status New    Target Date 07/02/20                  Plan - 05/21/20 1206    Clinical Impression Statement Pt. presents with chronic left LE pain localized primarily around knee region with associated left knee stiffness and left LE muscle weakness. No findings at eval suggestive of radiculopathy-would suspect symptoms particularly of knee pain and stiffness with sit<>stand eased with subsequent movement associated with OA with also past imaging findings of mild femoral subluxation on tibia. Pt. would benefit from PT to work on knee ROM and LE strengthening to help relieve pain and improve functional status for mobility.    Personal Factors and Comorbidities Comorbidity 2    Comorbidities  obesity, history knee PA, depression history    Examination-Activity Limitations Lift;Squat;Bend;Stairs;Locomotion Level;Stand    Examination-Participation Restrictions Cleaning;Laundry;Shop;Community Activity    Stability/Clinical Decision Making Evolving/Moderate complexity    Clinical Decision Making Moderate    Rehab Potential Good    PT Frequency 2x / week    PT Duration 6 weeks    PT Treatment/Interventions ADLs/Self Care Home Management;Cryotherapy;Electrical Stimulation;Iontophoresis 4mg /ml Dexamethasone;Moist Heat;Gait training;Stair training;Functional mobility training;Therapeutic activities;Neuromuscular re-education;Balance training;Therapeutic exercise;Patient/family education;Manual techniques;Dry needling;Taping    PT Next Visit Plan Review FOTO patient report by visit 3, review HEP as needed, NUSTEP warm up, work on knee ROM and general LE strengthening with open and closed chain activities as tolerated    PT Home Exercise Plan Access code: [email or web address]: heel raises, standing hip abduction, sit<>stand from edge of bed, LAQ with Theraband, heel slides    Consulted and Agree with Plan of Care Patient  Patient will benefit from skilled therapeutic intervention in order to improve the following deficits and impairments:  Difficulty walking,Hypomobility,Pain,Impaired flexibility,Decreased strength,Decreased activity tolerance,Decreased range of motion  Visit Diagnosis: Pain in left lower leg  Chronic pain of left knee  Muscle weakness (generalized)     Problem List Patient Active Problem List   Diagnosis Date Noted  . Proteinuria 07/10/2019  . Unilateral primary osteoarthritis, left knee 09/13/2016  . Encounter for gynecological examination without abnormal finding 07/27/2016  . Sprain of medial collateral ligament of left knee 06/07/2016  . Acute pain of left knee 06/02/2016  . Bipolar disorder, curr episode mixed, severe, with psychotic features ([place])  08/26/2015  . History of posttraumatic stress disorder (PTSD) 08/26/2015  . Essential hypertension 08/26/2015  . History of eczema 04/08/2015  . Pedal edema 08/06/2014  . Positive ANA (antinuclear antibody) 12/19/2012  . Biological false positive RPR test 08/18/2012  . HYPERCHOLESTEROLEMIA 04/28/2009  . HIDRADENITIS SUPPURATIVA 09/06/2007  . OBESITY 04/17/2007    Beaulah Dinning, PT, DPT 05/21/20 12:29 PM  [facility] [address] [place], [place], [address] Phone: [phone]   Fax:  [phone]  Name: Stacie Daugherty MRN: [id number] Date of Birth: [birth year]

## 2020-06-09 ENCOUNTER — Ambulatory Visit: Payer: Medicare Other | Admitting: Physical Therapy

## 2020-06-12 ENCOUNTER — Ambulatory Visit: Payer: Medicare Other | Admitting: Physical Therapy

## 2020-06-12 ENCOUNTER — Ambulatory Visit: Payer: Medicare Other | Admitting: Podiatry

## 2020-06-16 ENCOUNTER — Encounter: Payer: Medicare Other | Admitting: Physical Therapy

## 2020-06-19 ENCOUNTER — Ambulatory Visit: Payer: Medicare Other | Admitting: Physical Therapy

## 2020-06-24 ENCOUNTER — Encounter: Payer: Medicare Other | Admitting: Physical Therapy

## 2020-06-26 ENCOUNTER — Encounter: Payer: Self-pay | Admitting: Physical Therapy

## 2020-06-26 ENCOUNTER — Ambulatory Visit: Payer: Medicare Other | Admitting: Physical Therapy

## 2020-06-26 ENCOUNTER — Other Ambulatory Visit: Payer: Self-pay

## 2020-06-26 ENCOUNTER — Ambulatory Visit: Payer: Medicare Other | Attending: Nurse Practitioner | Admitting: Physical Therapy

## 2020-06-26 DIAGNOSIS — G8929 Other chronic pain: Secondary | ICD-10-CM | POA: Diagnosis not present

## 2020-06-26 DIAGNOSIS — R2689 Other abnormalities of gait and mobility: Secondary | ICD-10-CM | POA: Insufficient documentation

## 2020-06-26 DIAGNOSIS — M25561 Pain in right knee: Secondary | ICD-10-CM | POA: Diagnosis not present

## 2020-06-26 DIAGNOSIS — M6281 Muscle weakness (generalized): Secondary | ICD-10-CM | POA: Diagnosis not present

## 2020-06-26 DIAGNOSIS — M79662 Pain in left lower leg: Secondary | ICD-10-CM | POA: Diagnosis not present

## 2020-06-26 DIAGNOSIS — M25562 Pain in left knee: Secondary | ICD-10-CM | POA: Diagnosis not present

## 2020-06-26 NOTE — Therapy (Signed)
[place], [place], [address] Phone: [phone]   Fax:  [phone]  Physical Therapy Treatment  Patient Details  Name: Stacie Daugherty MRN: [id number] Date of Birth: [birth year] Referring Provider (PT): Dionisio David, NP   Encounter Date: 06/26/2020   PT End of Session - 06/26/20 1341    Visit Number 2    Number of Visits 12    Date for PT Re-Evaluation 07/02/20    Authorization Type UHC MCR/MCD secondary, progress note by visit 10, recheck FOTO at visit 6 and 10    PT Start Time 1230    PT Stop Time 1315    PT Time Calculation (min) 45 min           Past Medical History:  Diagnosis Date  . Allergy    mild  . Anxiety    bipolar   . Arthritis    both knees  . Bipolar 1 disorder ([place])    Hospitalized multiple times for bipolar disorder (DC - [facility], [facility], and [facility], most recently in [place])  . Depression    bipolar  . Hidradenitis suppurativa   . Hypertension     Past Surgical History:  Procedure Laterality Date  . BREAST BIOPSY Left 2012   benign  . COLOSTOMY  07/06/2015  . INDUCED ABORTION     in patient's 72s in [place], [place].  . WISDOM TOOTH EXTRACTION      There were no vitals filed for this visit.   Subjective Assessment - 06/26/20 1234    Subjective The leg is okay. It still feels weak when I get up first thing in the morning and with low chair up 6/10.    Pertinent History knee OA, obesity, history bipolar/depression    Limitations Standing;Lifting;Walking;House hold activities    Currently in Pain? No/denies              [facility] PT Assessment - 06/26/20 0001      AROM   Left Knee Flexion 105      Transfers   Five time sit to stand comments  13 sec                         [facility] Adult PT Treatment/Exercise - 06/26/20 0001      Knee/Hip Exercises: Aerobic   Nustep 5 minutes L3 LE only       Knee/Hip Exercises: Standing   Heel Raises 20 reps    Hip Abduction 10 reps;2 sets      Knee/Hip Exercises: Seated   Long Arc Quad 20 reps;Left;Right    Long Arc Quad Limitations Green    Hamstring Curl Right;Left;20 reps    Hamstring Limitations Green    Sit to [employer] 10 reps;2 sets      Knee/Hip Exercises: Sidelying   Clams 10 x 2 right, left                       PT Long Term Goals - 06/26/20 1240      PT LONG TERM GOAL #1   Title Independent with HEP    Baseline pt reports she is not perfroming consistently and [place] unsure how to do the theraband exercise.    Time 6    Period Weeks    Status On-going      PT LONG TERM GOAL #2   Title Pt. will improve FOTO score to  30% or less impairment    Time 6    Period Weeks    Status On-going      PT LONG TERM GOAL #3   Title Increase left knee flexion AROM to 110 deg or greater to improve ability for transfers from low seats and for stair navigation    Baseline 104 deg    Time 6    Period Weeks    Status Unable to assess      PT LONG TERM GOAL #4   Title Increase left knee strength to 5/5 to improve ability to navigate steps at condo entrance and to improve ability for transfers from low seats    Baseline 4+/5    Period Weeks    Status On-going      PT LONG TERM GOAL #5   Title Perform sit<>stand transfers from couch with left knee pain symptoms decreased 50% or greater form baseline status    Baseline continued difficulty due to pain and weakness    Time 6    Period Weeks    Status On-going                 Plan - 06/26/20 1242    Clinical Impression Statement Pt arrives after 1 month since evaulation. She reports minimal compliance with HEP. Reviewed HEP and began Nustep. She required cues to cues Theraband correctly with LAQ. Able to progress with lateral hip strength and update HEP. She reports continued difficulty with rising from low chairs including her sofa. SHe also has difficulty getting in  and out of the car. Encouraged increased compliance with HEP to maximize recovery of function.    Personal Factors and Comorbidities Comorbidity 2    Comorbidities obesity, history knee PA, depression history    Examination-Activity Limitations Lift;Squat;Bend;Stairs;Locomotion Level;Stand    Examination-Participation Restrictions Cleaning;Laundry;Shop;Community Activity    PT Next Visit Plan Review FOTO patient report by visit 3, review HEP as needed, NUSTEP warm up, work on knee ROM and general LE strengthening with open and closed chain activities as tolerated    PT Home Exercise Plan Access code: [email or web address]: heel raises, standing hip abduction, sit<>stand from edge of bed, LAQ with Theraband, heel slides           Patient will benefit from skilled therapeutic intervention in order to improve the following deficits and impairments:  Difficulty walking,Hypomobility,Pain,Impaired flexibility,Decreased strength,Decreased activity tolerance,Decreased range of motion  Visit Diagnosis: Pain in left lower leg  Chronic pain of left knee  Muscle weakness (generalized)  Chronic pain of right knee  Other abnormalities of gait and mobility     Problem List Patient Active Problem List   Diagnosis Date Noted  . Proteinuria 07/10/2019  . Unilateral primary osteoarthritis, left knee 09/13/2016  . Encounter for gynecological examination without abnormal finding 07/27/2016  . Sprain of medial collateral ligament of left knee 06/07/2016  . Chronic pain of left knee 06/02/2016  . Bipolar disorder, curr episode mixed, severe, with psychotic features ([place]) 08/26/2015  . History of posttraumatic stress disorder (PTSD) 08/26/2015  . Essential hypertension 08/26/2015  . History of eczema 04/08/2015  . Pedal edema 08/06/2014  . Positive ANA (antinuclear antibody) 12/19/2012  . Biological false positive RPR test 08/18/2012  . HYPERCHOLESTEROLEMIA 04/28/2009  . HIDRADENITIS SUPPURATIVA 09/06/2007   . OBESITY 04/17/2007    Dorene Ar, PTA 06/26/2020, 2:06 PM  [facility] [address] [place], [place], [address] Phone: [phone]   Fax:  [phone]  Name:  Stacie Daugherty MRN: [id number] Date of Birth: August 16, 1960

## 2020-06-30 ENCOUNTER — Encounter: Payer: Medicare Other | Admitting: Physical Therapy

## 2020-06-30 ENCOUNTER — Other Ambulatory Visit: Payer: Self-pay

## 2020-06-30 ENCOUNTER — Encounter: Payer: Self-pay | Admitting: Physical Therapy

## 2020-06-30 ENCOUNTER — Ambulatory Visit: Payer: Medicare Other | Attending: Nurse Practitioner | Admitting: Physical Therapy

## 2020-06-30 DIAGNOSIS — M25511 Pain in right shoulder: Secondary | ICD-10-CM | POA: Insufficient documentation

## 2020-06-30 DIAGNOSIS — G8929 Other chronic pain: Secondary | ICD-10-CM | POA: Insufficient documentation

## 2020-06-30 DIAGNOSIS — M6281 Muscle weakness (generalized): Secondary | ICD-10-CM | POA: Insufficient documentation

## 2020-06-30 DIAGNOSIS — M25562 Pain in left knee: Secondary | ICD-10-CM | POA: Insufficient documentation

## 2020-06-30 DIAGNOSIS — M25561 Pain in right knee: Secondary | ICD-10-CM | POA: Diagnosis not present

## 2020-06-30 DIAGNOSIS — R2689 Other abnormalities of gait and mobility: Secondary | ICD-10-CM

## 2020-06-30 DIAGNOSIS — M79662 Pain in left lower leg: Secondary | ICD-10-CM | POA: Insufficient documentation

## 2020-06-30 NOTE — Therapy (Signed)
[place], [place], [address] Phone: [phone]   Fax:  [phone]  Physical Therapy Treatment  Patient Details  Name: Stacie Daugherty MRN: [id number] Date of Birth: Dec 19, 1960 Referring Provider (PT): Dionisio David, NP   Encounter Date: 06/30/2020   PT End of Session - 06/30/20 0944    Visit Number 3    Number of Visits 12    Date for PT Re-Evaluation 07/02/20    Authorization Type UHC MCR/MCD secondary, progress note by visit 10, recheck FOTO at visit 6 and 10    PT Start Time 0935    PT Stop Time 1015    PT Time Calculation (min) 40 min           Past Medical History:  Diagnosis Date  . Allergy    mild  . Anxiety    bipolar   . Arthritis    both knees  . Bipolar 1 disorder ([place])    Hospitalized multiple times for bipolar disorder (DC - [facility], [facility], and [facility], most recently in [place])  . Depression    bipolar  . Hidradenitis suppurativa   . Hypertension     Past Surgical History:  Procedure Laterality Date  . BREAST BIOPSY Left 2012   benign  . COLOSTOMY  07/06/2015  . INDUCED ABORTION     in patient's 40s in [place], [place].  . WISDOM TOOTH EXTRACTION      There were no vitals filed for this visit.   Subjective Assessment - 06/30/20 0943    Subjective No pain right now. 5/10 with getting out of the car.    Currently in Pain? No/denies                             [facility] Adult PT Treatment/Exercise - 06/30/20 0001      Transfers   Five time sit to stand comments  12.3      Knee/Hip Exercises: Aerobic   Nustep 5 minutes L3 LE only      Knee/Hip Exercises: Standing   Heel Raises 20 reps    Hip Abduction 10 reps;2 sets    Forward Step Up 10 reps;Step Height: 4";Hand Hold: 1      Knee/Hip Exercises: Seated   Long Arc Quad 20 reps;Left;Right    Long Arc Quad Limitations Green    Hamstring Curl  Right;Left;20 reps    Hamstring Limitations Green    Sit to [employer] 10 reps;2 sets      Knee/Hip Exercises: Supine   Bridges 10 reps    Bridges Limitations 2 sets      Knee/Hip Exercises: Sidelying   Clams 10 x 2 right, left                  PT Education - 06/30/20 0948    Education Details FOTO score and potential to improve, HEP    Person(s) Educated Patient    Methods Explanation;Handout    Comprehension Verbalized understanding               PT Long Term Goals - 06/26/20 1240      PT LONG TERM GOAL #1   Title Independent with HEP    Baseline pt reports she is not perfroming consistently and [place] unsure how to do the theraband exercise.    Time 6    Period Weeks    Status On-going  PT LONG TERM GOAL #2   Title Pt. will improve FOTO score to 30% or less impairment    Time 6    Period Weeks    Status On-going      PT LONG TERM GOAL #3   Title Increase left knee flexion AROM to 110 deg or greater to improve ability for transfers from low seats and for stair navigation    Baseline 104 deg    Time 6    Period Weeks    Status Unable to assess      PT LONG TERM GOAL #4   Title Increase left knee strength to 5/5 to improve ability to navigate steps at condo entrance and to improve ability for transfers from low seats    Baseline 4+/5    Period Weeks    Status On-going      PT LONG TERM GOAL #5   Title Perform sit<>stand transfers from couch with left knee pain symptoms decreased 50% or greater form baseline status    Baseline continued difficulty due to pain and weakness    Time 6    Period Weeks    Status On-going                 Plan - 06/30/20 0945    Clinical Impression Statement Pt reports she does some exercises everyday but not all of them. She does not use the resistance band everyday. She continues with knee pain with transfers from lower surfaces and getting in and out of car. Progressed with 4 inch step ups and she did not have pain.  Able to progress to SLR and completed 2 sets in supine. Reviewed HEP and theraband exercises. No pain reported during treatment.    PT Treatment/Interventions ADLs/Self Care Home Management;Cryotherapy;Electrical Stimulation;Iontophoresis 4mg /ml Dexamethasone;Moist Heat;Gait training;Stair training;Functional mobility training;Therapeutic activities;Neuromuscular re-education;Balance training;Therapeutic exercise;Patient/family education;Manual techniques;Dry needling;Taping    PT Next Visit Plan , review HEP as needed, NUSTEP warm up, work on knee ROM and general LE strengthening with open and closed chain activities as tolerated    PT Home Exercise Plan Access code: [email or web address]: heel raises, standing hip abduction, sit<>stand from edge of bed, LAQ with Theraband, heel slides, side clams, bridge, SLR    Consulted and Agree with Plan of Care Patient           Patient will benefit from skilled therapeutic intervention in order to improve the following deficits and impairments:  Difficulty walking,Hypomobility,Pain,Impaired flexibility,Decreased strength,Decreased activity tolerance,Decreased range of motion  Visit Diagnosis: Pain in left lower leg  Chronic pain of left knee  Chronic pain of right knee  Other abnormalities of gait and mobility  Muscle weakness (generalized)     Problem List Patient Active Problem List   Diagnosis Date Noted  . Proteinuria 07/10/2019  . Unilateral primary osteoarthritis, left knee 09/13/2016  . Encounter for gynecological examination without abnormal finding 07/27/2016  . Sprain of medial collateral ligament of left knee 06/07/2016  . Chronic pain of left knee 06/02/2016  . Bipolar disorder, curr episode mixed, severe, with psychotic features ([place]) 08/26/2015  . History of posttraumatic stress disorder (PTSD) 08/26/2015  . Essential hypertension 08/26/2015  . History of eczema 04/08/2015  . Pedal edema 08/06/2014  . Positive ANA (antinuclear  antibody) 12/19/2012  . Biological false positive RPR test 08/18/2012  . HYPERCHOLESTEROLEMIA 04/28/2009  . HIDRADENITIS SUPPURATIVA 09/06/2007  . OBESITY 04/17/2007    Dorene Ar, PTA 06/30/2020, 10:17 AM  [facility] [place]  [place], [place], [address] Phone: [phone]   Fax:  [phone]  Name: Stacie Daugherty MRN: [id number] Date of Birth: [birth year]

## 2020-07-02 ENCOUNTER — Ambulatory Visit: Payer: Medicare Other | Admitting: Physical Therapy

## 2020-07-08 ENCOUNTER — Telehealth: Payer: Self-pay | Admitting: Podiatry

## 2020-07-08 NOTE — Telephone Encounter (Signed)
Patient called and wanted to know if she could cancel her appointment for Friday and get a pedicure. She previously had a fungal infection and took a round of antibiotics.

## 2020-07-08 NOTE — Telephone Encounter (Signed)
Thank you so much

## 2020-07-08 NOTE — Telephone Encounter (Signed)
I would hold off on pedicure for atleast 1 month or until after she is seen in office. Thanks Dr. Cannon Kettle

## 2020-07-08 NOTE — Telephone Encounter (Signed)
Dr. Cannon Kettle would you be able to assist me with this

## 2020-07-08 NOTE — Telephone Encounter (Signed)
Looks like Friday would have been my first time seeing her, so it would be a question for Dr. Cannon Kettle.

## 2020-07-09 ENCOUNTER — Encounter: Payer: Self-pay | Admitting: Physical Therapy

## 2020-07-09 ENCOUNTER — Other Ambulatory Visit: Payer: Self-pay

## 2020-07-09 ENCOUNTER — Ambulatory Visit: Payer: Medicare Other | Admitting: Physical Therapy

## 2020-07-09 DIAGNOSIS — G8929 Other chronic pain: Secondary | ICD-10-CM | POA: Diagnosis not present

## 2020-07-09 DIAGNOSIS — M6281 Muscle weakness (generalized): Secondary | ICD-10-CM | POA: Diagnosis not present

## 2020-07-09 DIAGNOSIS — R2689 Other abnormalities of gait and mobility: Secondary | ICD-10-CM | POA: Diagnosis not present

## 2020-07-09 DIAGNOSIS — M79662 Pain in left lower leg: Secondary | ICD-10-CM

## 2020-07-09 DIAGNOSIS — M25562 Pain in left knee: Secondary | ICD-10-CM | POA: Diagnosis not present

## 2020-07-09 DIAGNOSIS — M25511 Pain in right shoulder: Secondary | ICD-10-CM | POA: Diagnosis not present

## 2020-07-09 DIAGNOSIS — M25561 Pain in right knee: Secondary | ICD-10-CM | POA: Diagnosis not present

## 2020-07-09 NOTE — Therapy (Signed)
[place], [place], [address] Phone: [phone]   Fax:  [phone]  Physical Therapy Treatment/Recertification  Patient Details  Name: Stacie Daugherty MRN: [id number] Date of Birth: 10-28-1960 Referring Provider (PT): Dionisio David, NP   Encounter Date: 07/09/2020   PT End of Session - 07/09/20 1419    Visit Number 4    Number of Visits 15    Date for PT Re-Evaluation 08/20/20    Authorization Type UHC MCR/MCD secondary, progress note by visit 10, FOTO checked at visit 4, recheck by visit 10    PT Start Time 1419    PT Stop Time 1504    PT Time Calculation (min) 45 min    Activity Tolerance Patient tolerated treatment well    Behavior During Therapy [facility] for tasks assessed/performed           Past Medical History:  Diagnosis Date  . Allergy    mild  . Anxiety    bipolar   . Arthritis    both knees  . Bipolar 1 disorder ([place])    Hospitalized multiple times for bipolar disorder (DC - [facility], [facility], and [facility], most recently in [place])  . Depression    bipolar  . Hidradenitis suppurativa   . Hypertension     Past Surgical History:  Procedure Laterality Date  . BREAST BIOPSY Left 2012   benign  . COLOSTOMY  07/06/2015  . INDUCED ABORTION     in patient's 82s in [place], [place].  . WISDOM TOOTH EXTRACTION      There were no vitals filed for this visit.   Subjective Assessment - 07/09/20 1422    Subjective Pt. presents for 4th therapy visit since initial evaluation 05/21/21. She continues with knee/leg pain left>right and reports symptoms still worse upon waking in the morning and also notes continued difficulty with car transfers. No pain immediately pre-tx. but reports "sharp" pain in knees 5/10 with car transfers.    Pertinent History knee OA, obesity, history bipolar/depression    Limitations Standing;Lifting;Walking;House hold  activities    Diagnostic tests X-rays for knees 2018    Currently in Pain? No/denies              [facility] PT Assessment - 07/09/20 0001      Observation/Other Assessments   Focus on Therapeutic Outcomes (FOTO)  43%      AROM   Right Knee Extension 5    Right Knee Flexion 105    Left Knee Extension 3    Left Knee Flexion 110      Strength   Right Hip Flexion 4/5    Right Hip External Rotation  4/5    Right Hip Internal Rotation 4/5    Left Hip Flexion 4/5    Left Hip External Rotation 4/5    Left Hip Internal Rotation 4+/5    Right Knee Flexion 5/5    Right Knee Extension 5/5    Left Knee Flexion 4+/5    Left Knee Extension 5/5    Right Ankle Dorsiflexion 4/5    Right Ankle Inversion 4/5    Right Ankle Eversion 4/5    Left Ankle Dorsiflexion 4/5    Left Ankle Inversion 4/5    Left Ankle Eversion 4/5                         [facility] Adult PT Treatment/Exercise - 07/09/20 0001  Knee/Hip Exercises: Stretches   Press photographer Limitations standing gastroc stretch at counter 2x30 sec ea. bilat.      Knee/Hip Exercises: Aerobic   Nustep L4 x 5 min LE/UE      Knee/Hip Exercises: Standing   Heel Raises Both;20 reps    Heel Raises Limitations [place]    Lateral Step Up Left;Right;1 set;10 reps;Hand Hold: 2;Step Height: 4"    Forward Step Up Left;Right;1 set;10 reps;Hand Hold: 2;Step Height: 4"    Functional Squat 1 set;10 reps    Functional Squat Limitations mini squat at counter      Knee/Hip Exercises: Seated   Long Arc Quad 20 reps;Left;Right    Long Arc Quad Weight 4 lbs.                  PT Education - 07/09/20 1757    Education Details HEP update-pt. had noted a recent instance of muscle strain with sit<>stand so added standing partial squat as alternative which was well-tolerated during session, POC    Person(s) Educated Patient    Methods Explanation;Demonstration;Verbal cues;Handout    Comprehension Verbalized understanding;Returned  demonstration;Verbal cues required               PT Long Term Goals - 07/09/20 1540      PT LONG TERM GOAL #1   Title Independent with HEP    Baseline updates ongoing    Time 6    Period Weeks    Status On-going    Target Date 08/20/20      PT LONG TERM GOAL #2   Title Pt. will improve FOTO score to 46% or less impairment    Baseline 57% limited/43% function as of 07/09/20    Time 6    Period Weeks    Status Revised    Target Date 08/20/20      PT LONG TERM GOAL #3   Title Increase left knee flexion AROM to 110 deg or greater to improve ability for transfers from low seats and for stair navigation    Baseline 110 deg    Time 6    Period Weeks    Status Achieved      PT LONG TERM GOAL #4   Title Increase left knee strength to 5/5 to improve ability to navigate steps at condo entrance and to improve ability for transfers from low seats    Baseline 4+/5 flexion, 5/5 extension    Time 6    Period Weeks    Status Partially Met    Target Date 08/20/20      PT LONG TERM GOAL #5   Title Perform sit<>stand transfers from couch with left knee pain symptoms decreased 50% or greater form baseline status    Baseline still ongoing    Time 6    Period Weeks    Status On-going    Target Date 08/20/20                 Plan - 07/09/20 1439    Clinical Impression Statement Fair progess with therapy to date with status impacted by limited number therapy visits thusar (4 visits) along with symptom etiology with level of underlying OA along with symptom duration and medical comorbidities. Given this expect gradual progress with therapy and that more substantial functioal improvements will take more time with therapy. Pt. has demonstrated mild improvements in left knee ROM and quad strength and with recent visits has been able to tolerate progression of gentle closed chain exercises which  should help with further strength gains. Plan continue therapy for further progress to help  relieve pain and address associated functional limitations for mobility.    Comorbidities obesity, history knee OA, depression history    Examination-Activity Limitations Lift;Squat;Bend;Stairs;Locomotion Level;Stand    Examination-Participation Restrictions Cleaning;Laundry;Shop;Community Activity    Stability/Clinical Decision Making Evolving/Moderate complexity    Clinical Decision Making Moderate    Rehab Potential Good    PT Frequency 2x / week    PT Duration 6 weeks    PT Treatment/Interventions ADLs/Self Care Home Management;Cryotherapy;Electrical Stimulation;Iontophoresis 62m/ml Dexamethasone;Moist Heat;Gait training;Stair training;Functional mobility training;Therapeutic activities;Neuromuscular re-education;Balance training;Therapeutic exercise;Patient/family education;Manual techniques;Dry needling;Taping    PT Next Visit Plan Continue progression of LE strengthening with further closed chain activities as tolerated, knee ROM, stretches, update HEP prn    PT Home Exercise Plan Access code: [email or web address]: heel raises, standing hip abduction, sit<>stand from edge of bed, LAQ with Theraband, heel slides, side clams, bridge, SLR, partial squat at counter as potential alternative to sit<>stand    Consulted and Agree with Plan of Care Patient           Patient will benefit from skilled therapeutic intervention in order to improve the following deficits and impairments:  Difficulty walking,Hypomobility,Pain,Impaired flexibility,Decreased strength,Decreased activity tolerance,Decreased range of motion  Visit Diagnosis: Pain in left lower leg  Chronic pain of left knee  Chronic pain of right knee  Other abnormalities of gait and mobility  Muscle weakness (generalized)     Problem List Patient Active Problem List   Diagnosis Date Noted  . Proteinuria 07/10/2019  . Unilateral primary osteoarthritis, left knee 09/13/2016  . Encounter for gynecological examination without abnormal  finding 07/27/2016  . Sprain of medial collateral ligament of left knee 06/07/2016  . Chronic pain of left knee 06/02/2016  . Bipolar disorder, curr episode mixed, severe, with psychotic features ([place] 08/26/2015  . History of posttraumatic stress disorder (PTSD) 08/26/2015  . Essential hypertension 08/26/2015  . History of eczema 04/08/2015  . Pedal edema 08/06/2014  . Positive ANA (antinuclear antibody) 12/19/2012  . Biological false positive RPR test 08/18/2012  . HYPERCHOLESTEROLEMIA 04/28/2009  . HIDRADENITIS SUPPURATIVA 09/06/2007  . OBESITY 04/17/2007    CBeaulah Dinning PT, DPT 07/09/20 6:07 PM  [facility]GSh[address] N[place] [address]Phone: [phone]  Fax:  [phone] Name: TLindzee GougeMRN: [id number] of Birth: 7Apr 16, 1962

## 2020-07-10 ENCOUNTER — Ambulatory Visit (INDEPENDENT_AMBULATORY_CARE_PROVIDER_SITE_OTHER): Payer: Medicare Other | Admitting: Podiatry

## 2020-07-10 ENCOUNTER — Encounter: Payer: Self-pay | Admitting: Podiatry

## 2020-07-10 DIAGNOSIS — M79675 Pain in left toe(s): Secondary | ICD-10-CM

## 2020-07-10 DIAGNOSIS — M79674 Pain in right toe(s): Secondary | ICD-10-CM | POA: Diagnosis not present

## 2020-07-10 DIAGNOSIS — B351 Tinea unguium: Secondary | ICD-10-CM | POA: Diagnosis not present

## 2020-07-10 DIAGNOSIS — M2142 Flat foot [pes planus] (acquired), left foot: Secondary | ICD-10-CM

## 2020-07-10 DIAGNOSIS — M2141 Flat foot [pes planus] (acquired), right foot: Secondary | ICD-10-CM

## 2020-07-14 ENCOUNTER — Encounter: Payer: Self-pay | Admitting: Physical Therapy

## 2020-07-14 ENCOUNTER — Ambulatory Visit: Payer: Medicare Other | Admitting: Physical Therapy

## 2020-07-14 ENCOUNTER — Other Ambulatory Visit: Payer: Self-pay

## 2020-07-14 DIAGNOSIS — M6281 Muscle weakness (generalized): Secondary | ICD-10-CM

## 2020-07-14 DIAGNOSIS — R2689 Other abnormalities of gait and mobility: Secondary | ICD-10-CM | POA: Diagnosis not present

## 2020-07-14 DIAGNOSIS — M79662 Pain in left lower leg: Secondary | ICD-10-CM | POA: Diagnosis not present

## 2020-07-14 DIAGNOSIS — M25561 Pain in right knee: Secondary | ICD-10-CM | POA: Diagnosis not present

## 2020-07-14 DIAGNOSIS — G8929 Other chronic pain: Secondary | ICD-10-CM | POA: Diagnosis not present

## 2020-07-14 DIAGNOSIS — M25562 Pain in left knee: Secondary | ICD-10-CM

## 2020-07-14 DIAGNOSIS — M25511 Pain in right shoulder: Secondary | ICD-10-CM | POA: Diagnosis not present

## 2020-07-14 NOTE — Therapy (Signed)
[place], [place], [address] Phone: [phone]   Fax:  [phone]  Physical Therapy Treatment  Patient Details  Name: Stacie Daugherty MRN: [id number] Date of Birth: Apr 26, 1961 Referring Provider (PT): Dionisio David, NP   Encounter Date: 07/14/2020   PT End of Session - 07/14/20 1108    Visit Number 5    Number of Visits 15    Date for PT Re-Evaluation 08/20/20    Authorization Type [employer] MCR/MCD secondary, progress note by visit 60, FOTO checked at visit 4, recheck by visit 10    PT Start Time 1102    PT Stop Time 1140    PT Time Calculation (min) 38 min           Past Medical History:  Diagnosis Date  . Allergy    mild  . Anxiety    bipolar   . Arthritis    both knees  . Bipolar 1 disorder ([place])    Hospitalized multiple times for bipolar disorder ([facility] - [facility], [facility], and [facility], most recently in [place])  . Depression    bipolar  . Hidradenitis suppurativa   . Hypertension     Past Surgical History:  Procedure Laterality Date  . BREAST BIOPSY Left 2012   benign  . COLOSTOMY  07/06/2015  . INDUCED ABORTION     in patient's 72s in [place], [place].  . WISDOM TOOTH EXTRACTION      There were no vitals filed for this visit.   Subjective Assessment - 07/14/20 1106    Subjective Pt reports continued pain with car transfers. She is performing HEP daily.    Currently in Pain? No/denies                             [facility] Adult PT Treatment/Exercise - 07/14/20 0001      Knee/Hip Exercises: Stretches   Gastroc Stretch Limitations standing gastroc stretch at counter 2x30 sec ea. bilat.      Knee/Hip Exercises: Aerobic   Nustep L5 LE only x 5 minutes      Knee/Hip Exercises: Standing   Heel Raises Both;20 reps    Heel Raises Limitations Airex    Hip Abduction 10 reps;2 sets    Lateral Step Up Left;Right;1 set;10  reps;Hand Hold: 2;Step Height: 4"    Forward Step Up Left;Right;1 set;10 reps;Hand Hold: 2;Step Height: 4"    Functional Squat 2 sets;10 reps    Functional Squat Limitations mini squat at counter      Knee/Hip Exercises: Seated   Long Arc Quad 20 reps;Left;Right    Long Arc Quad Weight 4 lbs.    Hamstring Curl Right;Left;20 reps    Hamstring Limitations Green      Knee/Hip Exercises: Supine   Bridges 10 reps    Bridges Limitations 2 sets      Knee/Hip Exercises: Sidelying   Clams 10 x 2 right, left                       PT Long Term Goals - 07/09/20 1540      PT LONG TERM GOAL #1   Title Independent with HEP    Baseline updates ongoing    Time 6    Period Weeks    Status On-going    Target Date 08/20/20      PT LONG TERM GOAL #2  Title Pt. will improve FOTO score to 46% or less impairment    Baseline 57% limited/43% function as of 07/09/20    Time 6    Period Weeks    Status Revised    Target Date 08/20/20      PT LONG TERM GOAL #3   Title Increase left knee flexion AROM to 110 deg or greater to improve ability for transfers from low seats and for stair navigation    Baseline 110 deg    Time 6    Period Weeks    Status Achieved      PT LONG TERM GOAL #4   Title Increase left knee strength to 5/5 to improve ability to navigate steps at condo entrance and to improve ability for transfers from low seats    Baseline 4+/5 flexion, 5/5 extension    Time 6    Period Weeks    Status Partially Met    Target Date 08/20/20      PT LONG TERM GOAL #5   Title Perform sit<>stand transfers from couch with left knee pain symptoms decreased 50% or greater form baseline status    Baseline still ongoing    Time 6    Period Weeks    Status On-going    Target Date 08/20/20                 Plan - 07/14/20 1112    Clinical Impression Statement Pt reports compliance with HEP daily however continued pain in the mornings and with car transfers. Continued with  LE strengthening with closed chain as tolerated. One c/o pain in foot with getting onto Nustep due to seat being close.    PT Next Visit Plan Continue progression of LE strengthening with further closed chain activities as tolerated, knee ROM, stretches, update HEP prn    PT Home Exercise Plan Access code: [email or web address]: heel raises, standing hip abduction, sit<>stand from edge of bed, LAQ with Theraband, heel slides, side clams, bridge, SLR, partial squat at counter as potential alternative to sit<>stand    Consulted and Agree with Plan of Care Patient           Patient will benefit from skilled therapeutic intervention in order to improve the following deficits and impairments:  Difficulty walking,Hypomobility,Pain,Impaired flexibility,Decreased strength,Decreased activity tolerance,Decreased range of motion  Visit Diagnosis: Pain in left lower leg  Chronic pain of left knee  Chronic pain of right knee  Other abnormalities of gait and mobility  Muscle weakness (generalized)     Problem List Patient Active Problem List   Diagnosis Date Noted  . Proteinuria 07/10/2019  . Unilateral primary osteoarthritis, left knee 09/13/2016  . Encounter for gynecological examination without abnormal finding 07/27/2016  . Sprain of medial collateral ligament of left knee 06/07/2016  . Chronic pain of left knee 06/02/2016  . Bipolar disorder, curr episode mixed, severe, with psychotic features ([place]) 08/26/2015  . History of posttraumatic stress disorder (PTSD) 08/26/2015  . Essential hypertension 08/26/2015  . History of eczema 04/08/2015  . Pedal edema 08/06/2014  . Positive ANA (antinuclear antibody) 12/19/2012  . Biological false positive RPR test 08/18/2012  . HYPERCHOLESTEROLEMIA 04/28/2009  . HIDRADENITIS SUPPURATIVA 09/06/2007  . OBESITY 04/17/2007    Dorene Ar, PTA 07/14/2020, 11:44 AM  [facility] [address] [place], [place], [address] Phone: [phone]   Fax:  [phone]  Name: Stacie Daugherty MRN: [id number] Date of Birth: Apr 29, 1961

## 2020-07-15 NOTE — Progress Notes (Signed)
Subjective:  Patient ID: Stacie Daugherty, female    DOB: [birth year],  MRN: [id number]  Stacie Daugherty presents to clinic today for painful thick toenails that are difficult to trim. Pain interferes with ambulation. Aggravating factors include wearing enclosed shoe gear. Pain is relieved with periodic professional debridement..   Current Outpatient Medications:  .  ABILIFY MAINTENA 400 MG PRSY prefilled syringe, SMARTSIG:100 Milligram(s) IM Every 4 Weeks, Disp: , Rfl:  .  atorvastatin (LIPITOR) 20 MG tablet, Take 1 tablet (20 mg total) by mouth daily., Disp: 90 tablet, Rfl: 3 .  carbamazepine (TEGRETOL) 200 MG tablet, Take 1 tablet (200 mg total) by mouth 2 (two) times daily at 8 am and 10 pm., Disp: 60 tablet, Rfl: 0 .  carvedilol (COREG) 3.125 MG tablet, Take 1 tablet (3.125 mg total) by mouth daily., Disp: 90 tablet, Rfl: 3 .  chlorhexidine (PERIDEX) 0.12 % solution, 15 mLs 2 (two) times daily., Disp: , Rfl:  .  Cholecalciferol (VITAMIN D3) 25 MCG (1000 UT) CAPS, Take 1,000 Units by mouth daily., Disp: , Rfl:  .  divalproex (DEPAKOTE) 500 MG DR tablet, Take 500 mg by mouth 2 (two) times daily., Disp: , Rfl:  .  gabapentin (NEURONTIN) 300 MG capsule, Take 1 capsule (300 mg total) by mouth 3 (three) times daily at 8am, 2pm and bedtime. For agitation, Disp: 90 capsule, Rfl: 0 .  itraconazole (SPORANOX) 100 MG capsule, Take 100 mg by mouth 2 (two) times daily., Disp: , Rfl:  .  Itraconazole 200 MG TABS, Take One tablet by mouth daily, Disp: 90 tablet, Rfl: 0 .  losartan-hydrochlorothiazide (HYZAAR) 50-12.5 MG tablet, Take 1 tablet by mouth daily., Disp: 90 tablet, Rfl: 3 .  MELATONIN PO, Take 1 tablet by mouth at bedtime., Disp: , Rfl:  .  Multiple Vitamin (MULTIVITAMIN PO), Take by mouth., Disp: , Rfl:  .  SODIUM FLUORIDE 5000 PPM 1.1 % PSTE, , Disp: , Rfl:  .  vitamin C (ASCORBIC ACID) 500 MG tablet, Take 500 mg by mouth daily., Disp: , Rfl:   Allergies   Allergen Reactions  . Haldol [Haloperidol Lactate] Other (See Comments)    Pt  Just doesn't like because she cant remember anything the next day.     Review of Systems: Negative except as noted in the HPI. Objective:   Constitutional Stacie Daugherty is a pleasant 60 y.o. African American female, in NAD. AAO x 3.   Vascular Capillary fill time to digits <3 seconds b/l lower extremities. Palpable pedal pulses b/l LE. Pedal hair present. Lower extremity skin temperature gradient within normal limits. Trace edema noted b/l lower extremities. No cyanosis or clubbing noted.  Neurologic Normal speech. Oriented to person, place, and time. Protective sensation intact 5/5 intact bilaterally with 10g monofilament b/l.  Dermatologic Pedal skin with normal turgor, texture and tone bilaterally. No open wounds bilaterally. No interdigital macerations bilaterally. Toenails 1-5 b/l elongated, discolored, dystrophic, thickened, crumbly with subungual debris and tenderness to dorsal palpation. Hyperkeratotic lesion(s) submet head 3 right foot, submet head 5 left foot and submet head 5 right foot.  No erythema, no edema, no drainage, no fluctuance.  Orthopedic: Normal muscle strength 5/5 to all lower extremity muscle groups bilaterally. No pain crepitus or joint limitation noted with ROM b/l. Pes planus deformity noted b/l.    Radiographs: None Assessment:   1. Pain due to onychomycosis of toenails of both feet   2. Pes planus of both feet    Plan:  Patient was evaluated and treated and all questions answered.  Onychomycosis with pain -Nails palliatively debridement as below -Educated on self-care  Procedure: Nail Debridement Rationale: Pain Type of Debridement: manual, sharp debridement. Instrumentation: Nail nipper, rotary burr. Number of Nails: 10 -Examined patient. -Patient refuses services of callus paring  today. -Patient to continue soft, supportive shoe gear daily. -Toenails  1-5 b/l were debrided in length and girth with sterile nail nippers and dremel without iatrogenic bleeding.  -Patient to report any pedal injuries to medical professional immediately. -Patient/POA to call should there be question/concern in the interim.  Return in about 3 months (around 10/07/2020) for nail trim.  Marzetta Board, DPM

## 2020-07-16 ENCOUNTER — Ambulatory Visit (INDEPENDENT_AMBULATORY_CARE_PROVIDER_SITE_OTHER): Payer: Medicare Other | Admitting: Nurse Practitioner

## 2020-07-16 ENCOUNTER — Encounter: Payer: Medicare Other | Admitting: Physical Therapy

## 2020-07-16 ENCOUNTER — Encounter: Payer: Self-pay | Admitting: Nurse Practitioner

## 2020-07-16 ENCOUNTER — Other Ambulatory Visit: Payer: Self-pay

## 2020-07-16 VITALS — BP 155/70 | HR 76 | Temp 98.1°F | Ht 64.0 in | Wt 287.0 lb

## 2020-07-16 DIAGNOSIS — M25511 Pain in right shoulder: Secondary | ICD-10-CM | POA: Diagnosis not present

## 2020-07-16 DIAGNOSIS — I10 Essential (primary) hypertension: Secondary | ICD-10-CM

## 2020-07-16 DIAGNOSIS — Z6841 Body Mass Index (BMI) 40.0 and over, adult: Secondary | ICD-10-CM

## 2020-07-16 NOTE — Patient Instructions (Signed)
Healthy Eating Following a healthy eating pattern may help you to achieve and maintain a healthy body weight, reduce the risk of chronic disease, and live a long and productive life. It is important to follow a healthy eating pattern at an appropriate calorie level for your body. Your nutritional needs should be met primarily through food by choosing a variety of nutrient-rich foods. What are tips for following this plan? Reading food labels  Read labels and choose the following: ? Reduced or low sodium. ? Juices with 100% fruit juice. ? Foods with low saturated fats and high polyunsaturated and monounsaturated fats. ? Foods with whole grains, such as whole wheat, cracked wheat, brown rice, and wild rice. ? Whole grains that are fortified with folic acid. This is recommended for women who are pregnant or who want to become pregnant.  Read labels and avoid the following: ? Foods with a lot of added sugars. These include foods that contain brown sugar, corn sweetener, corn syrup, dextrose, fructose, glucose, high-fructose corn syrup, honey, invert sugar, lactose, malt syrup, maltose, molasses, raw sugar, sucrose, trehalose, or turbinado sugar.  Do not eat more than the following amounts of added sugar per day:  6 teaspoons (25 g) for women.  9 teaspoons (38 g) for men. ? Foods that contain processed or refined starches and grains. ? Refined grain products, such as white flour, degermed cornmeal, white bread, and white rice. Shopping  Choose nutrient-rich snacks, such as vegetables, whole fruits, and nuts. Avoid high-calorie and high-sugar snacks, such as potato chips, fruit snacks, and candy.  Use oil-based dressings and spreads on foods instead of solid fats such as butter, stick margarine, or cream cheese.  Limit pre-made sauces, mixes, and "instant" products such as flavored rice, instant noodles, and ready-made pasta.  Try more plant-protein sources, such as tofu, tempeh, black beans,  edamame, lentils, nuts, and seeds.  Explore eating plans such as the Mediterranean diet or vegetarian diet. Cooking  Use oil to saut or stir-fry foods instead of solid fats such as butter, stick margarine, or lard.  Try baking, boiling, grilling, or broiling instead of frying.  Remove the fatty part of meats before cooking.  Steam vegetables in water or broth. Meal planning  At meals, imagine dividing your plate into fourths: ? One-half of your plate is fruits and vegetables. ? One-fourth of your plate is whole grains. ? One-fourth of your plate is protein, especially lean meats, poultry, eggs, tofu, beans, or nuts.  Include low-fat dairy as part of your daily diet.   Lifestyle  Choose healthy options in all settings, including home, work, school, restaurants, or stores.  Prepare your food safely: ? Wash your hands after handling raw meats. ? Keep food preparation surfaces clean by regularly washing with hot, soapy water. ? Keep raw meats separate from ready-to-eat foods, such as fruits and vegetables. ? Cook seafood, meat, poultry, and eggs to the recommended internal temperature. ? Store foods at safe temperatures. In general:  Keep cold foods at 7F (4.4C) or below.  Keep hot foods at 17F (60C) or above.  Keep your freezer at [facility] (-17.8C) or below.  Foods are no longer safe to eat when they have been between the temperatures of 40-17F (4.4-60C) for more than 2 hours. What foods should I eat? Fruits Aim to eat 2 cup-equivalents of fresh, canned (in natural juice), or frozen fruits each day. Examples of 1 cup-equivalent of fruit include 1 small apple, 8 large strawberries, 1 cup canned fruit,  cup dried fruit, or 1 cup 100% juice. Vegetables Aim to eat 2-3 cup-equivalents of fresh and frozen vegetables each day, including different varieties and colors. Examples of 1 cup-equivalent of vegetables include 2 medium carrots, 2 cups raw, leafy greens, 1 cup chopped  vegetable (raw or cooked), or 1 medium baked potato. Grains Aim to eat 6 ounce-equivalents of whole grains each day. Examples of 1 ounce-equivalent of grains include 1 slice of bread, 1 cup ready-to-eat cereal, 3 cups popcorn, or  cup cooked rice, pasta, or cereal. Meats and other proteins Aim to eat 5-6 ounce-equivalents of protein each day. Examples of 1 ounce-equivalent of protein include 1 egg, 1/2 cup nuts or seeds, or 1 tablespoon (16 g) peanut butter. A cut of meat or fish that is the size of a deck of cards is about 3-4 ounce-equivalents.  Of the protein you eat each week, try to have at least 8 ounces come from seafood. This includes salmon, trout, herring, and anchovies. Dairy Aim to eat 3 cup-equivalents of fat-free or low-fat dairy each day. Examples of 1 cup-equivalent of dairy include 1 cup (240 mL) milk, 8 ounces (250 g) yogurt, 1 ounces (44 g) natural cheese, or 1 cup (240 mL) fortified soy milk. Fats and oils  Aim for about 5 teaspoons (21 g) per day. Choose monounsaturated fats, such as canola and olive oils, avocados, peanut butter, and most nuts, or polyunsaturated fats, such as sunflower, corn, and soybean oils, walnuts, pine nuts, sesame seeds, sunflower seeds, and flaxseed. Beverages  Aim for six 8-oz glasses of water per day. Limit coffee to three to five 8-oz cups per day.  Limit caffeinated beverages that have added calories, such as soda and energy drinks.  Limit alcohol intake to no more than 1 drink a day for nonpregnant women and 2 drinks a day for men. One drink equals 12 oz of beer (355 mL), 5 oz of wine (148 mL), or 1 oz of hard liquor (44 mL). Seasoning and other foods  Avoid adding excess amounts of salt to your foods. Try flavoring foods with herbs and spices instead of salt.  Avoid adding sugar to foods.  Try using oil-based dressings, sauces, and spreads instead of solid fats. This information is based on general [place] nutrition guidelines. For more  information, visit choosemyplate.gov. Exact amounts may vary based on your nutrition needs. Summary  A healthy eating plan may help you to maintain a healthy weight, reduce the risk of chronic diseases, and stay active throughout your life.  Plan your meals. Make sure you eat the right portions of a variety of nutrient-rich foods.  Try baking, boiling, grilling, or broiling instead of frying.  Choose healthy options in all settings, including home, work, school, restaurants, or stores. This information is not intended to replace advice given to you by your health care provider. Make sure you discuss any questions you have with your health care provider. Document Revised: 08/28/2017 Document Reviewed: 08/28/2017 Elsevier Patient Education  2021 [employer]  
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
## 2020-07-21 ENCOUNTER — Encounter: Payer: Self-pay | Admitting: Physical Therapy

## 2020-07-21 ENCOUNTER — Other Ambulatory Visit: Payer: Self-pay

## 2020-07-21 ENCOUNTER — Ambulatory Visit: Payer: Medicare Other | Admitting: Physical Therapy

## 2020-07-21 DIAGNOSIS — M79662 Pain in left lower leg: Secondary | ICD-10-CM | POA: Diagnosis not present

## 2020-07-21 DIAGNOSIS — G8929 Other chronic pain: Secondary | ICD-10-CM

## 2020-07-21 DIAGNOSIS — M25562 Pain in left knee: Secondary | ICD-10-CM

## 2020-07-21 DIAGNOSIS — R2689 Other abnormalities of gait and mobility: Secondary | ICD-10-CM

## 2020-07-21 DIAGNOSIS — M6281 Muscle weakness (generalized): Secondary | ICD-10-CM | POA: Diagnosis not present

## 2020-07-21 DIAGNOSIS — M25511 Pain in right shoulder: Secondary | ICD-10-CM

## 2020-07-21 DIAGNOSIS — M25561 Pain in right knee: Secondary | ICD-10-CM | POA: Diagnosis not present

## 2020-07-21 NOTE — Therapy (Signed)
[place], [place], [address] Phone: [phone]   Fax:  [phone]  Physical Therapy Treatment/Re-evaluation  Patient Details  Name: Stacie Daugherty MRN: [id number] Date of Birth: 16-Jul-1960 Referring Provider (PT): Dionisio David, NP   Encounter Date: 07/21/2020   PT End of Session - 07/21/20 1058    Visit Number 6    Number of Visits 17    Date for PT Re-Evaluation 09/01/20    Authorization Type [employer] MCR/MCD secondary, progress note by visit 16 due to re-eval at visit 6, recheck FOTO at visit 10    PT Start Time 1014    PT Stop Time 1058    PT Time Calculation (min) 44 min    Activity Tolerance Patient tolerated treatment well    Behavior During Therapy [facility] for tasks assessed/performed           Past Medical History:  Diagnosis Date  . Allergy    mild  . Anxiety    bipolar   . Arthritis    both knees  . Bipolar 1 disorder ([place])    Hospitalized multiple times for bipolar disorder (DC - [facility], [facility], and [facility], most recently in [place])  . Depression    bipolar  . Hidradenitis suppurativa   . Hypertension     Past Surgical History:  Procedure Laterality Date  . BREAST BIOPSY Left 2012   benign  . COLOSTOMY  07/06/2015  . INDUCED ABORTION     in patient's 76s in [place], [place].  . WISDOM TOOTH EXTRACTION      There were no vitals filed for this visit.   Subjective Assessment - 07/21/20 1016    Subjective Continued pain with car transfers up to 8/10 in left knee/leg, knees also worse when first getting out of bed in the morning but eases somewhat with getting moving. Pt. also received new PT order for right shoulder pain which started about 4-5 months ago. She reports had been doing weight training exercises but no specific mechanism of injury noted. She reports pain in right elbow region (distal bicep) with extending elbow. Local  shoulder symptoms are proimarily issues with "popping" in right AC joint region with reaching arm behind back.    Pertinent History knee OA, obesity, history bipolar/depression    Currently in Pain? Yes    Pain Score --   8/10 with car transfers   Pain Location Knee    Pain Orientation Left    Pain Descriptors / Indicators Sharp    Pain Type Chronic pain    Pain Onset More than a month ago    Pain Frequency Intermittent    Aggravating Factors  car transfers, worse in AM    Pain Relieving Factors getting moving              [facility] PT Assessment - 07/21/20 0001      Assessment   Medical Diagnosis Left LE pain and weakness, right shoulder pain (acute)    Onset Date/Surgical Date --   shoulder onset 4-5 months ago     ROM / Strength   AROM / PROM / Strength AROM;Strength      AROM   AROM Assessment Site Shoulder    Right/Left Shoulder Right;Left    Right Shoulder Flexion 150 Degrees    Right Shoulder ABduction 125 Degrees    Right Shoulder Internal Rotation --   reach to L4   Right Shoulder External Rotation --  reach to T1   Left Shoulder Flexion 160 Degrees    Left Shoulder ABduction 160 Degrees    Left Shoulder Internal Rotation --   reach to sacrum   Left Shoulder External Rotation --   reach to T1     Strength   Strength Assessment Site Shoulder;Elbow    Right/Left Shoulder Right;Left    Right Shoulder Flexion 4+/5    Right Shoulder ABduction 4+/5    Right Shoulder Internal Rotation 5/5    Right Shoulder External Rotation 5/5    Left Shoulder Flexion 4+/5    Left Shoulder ABduction 4+/5    Left Shoulder Internal Rotation 5/5    Left Shoulder External Rotation 5/5    Right/Left Elbow Right;Left    Right Elbow Flexion 5/5   increased elbow pain   Right Elbow Extension 5/5    Left Elbow Flexion 5/5    Left Elbow Extension 5/5      Palpation   Palpation comment TTP right distal biceps tendon                         [facility] Adult PT  Treatment/Exercise - 07/21/20 0001      Exercises   Exercises Shoulder      Knee/Hip Exercises: Aerobic   Nustep L4 x 5 min UE/LE      Shoulder Exercises: Standing   External Rotation AROM;Strengthening;Both;20 reps    Theraband Level (Shoulder External Rotation) Level 3 (Green)    Internal Rotation AROM;Strengthening;Right;20 reps    Theraband Level (Shoulder Internal Rotation) Level 3 (Green)    Extension Strengthening;AROM;20 reps    Theraband Level (Shoulder Extension) Level 3 (Green)    Row AROM;Strengthening;Both;20 reps    Theraband Level (Shoulder Row) Level 3 (Green)    Other Standing Exercises Rigth bicep eccentric 2x10 with 3 lbs. then 1 ste practice x 10 reps with green band      Shoulder Exercises: Stretch   Other Shoulder Stretches sleeper stretch at wall 10 sec x 3 reps right side                  PT Education - 07/21/20 1115    Education Details Symptom etiology, HEP updates    Person(s) Educated Patient    Methods Explanation;Demonstration;Tactile cues    Comprehension Returned demonstration;Verbalized understanding;Verbal cues required;Tactile cues required               PT Long Term Goals - 07/21/20 1121      PT LONG TERM GOAL #1   Title Independent with HEP    Baseline updates ongoing-added shoulder exercises 07/21/20    Time 6    Period Weeks    Status On-going    Target Date 09/01/20      PT LONG TERM GOAL #2   Title Pt. will improve FOTO score to 46% or less impairment    Baseline 57% limited/43% function as of 07/09/20    Time 6    Period Weeks    Status On-going    Target Date 09/01/20      PT LONG TERM GOAL #3   Title Increase left knee flexion AROM to 110 deg or greater to improve ability for transfers from low seats and for stair navigation    Baseline 110 deg    Time 6    Period Weeks    Status Achieved      PT LONG TERM GOAL #4   Title Increase left knee  strength to 5/5 to improve ability to navigate steps at condo  entrance and to improve ability for transfers from low seats    Baseline 4+/5 flexion, 5/5 extension    Time 6    Period Weeks    Status Partially Met    Target Date 09/01/20      PT LONG TERM GOAL #5   Title Perform sit<>stand transfers from couch with left knee pain symptoms decreased 50% or greater form baseline status    Baseline still ongoing    Time 6    Period Weeks    Status On-going    Target Date 09/01/20      Additional Long Term Goals   Additional Long Term Goals Yes      PT LONG TERM GOAL #6   Title Increase right shoulder strength to 5/5 for flexion and abduction to improve ability for lifting for chores    Baseline 4+/5    Time 6    Period Weeks    Status New    Target Date 09/01/20      PT LONG TERM GOAL #7   Title Decrease shoulder/elbow pain and symptoms at least 50% from baseline status for reaching behind back for dressing/bathing and for extending elbow without pain    Time 6    Period Weeks    Status New    Target Date 09/01/20                 Plan - 07/21/20 1116    Clinical Impression Statement Pt. presents for PT re-eval today with new referral for right shoulder pain. Current symptoms for elbow region/distal bicep tendon consistent with potential biceps tendinopathy. Symptoms of crepitus noted at [facility] joint region so also suspect local [facility] joint issue with also associated posterior shoulder tightness impacting ability to reach behind back for dressing and bathing. Therapy goals/status for knee pain still ongoing with pain worse in AM and with car transfers consistent with underlying OA. Plan add shoulder region to POC and continue PT for further progress to address associated functional limitations.    Personal Factors and Comorbidities Comorbidity 2   multiple tx. areas   Comorbidities obesity, history knee OA, depression history    Examination-Activity Limitations Lift;Squat;Bend;Stairs;Locomotion Level;Stand    Examination-Participation  Restrictions Cleaning;Laundry;Shop;Community Activity    Stability/Clinical Decision Making Evolving/Moderate complexity    Clinical Decision Making Moderate    Rehab Potential Good    PT Frequency 2x / week    PT Duration 6 weeks    PT Treatment/Interventions ADLs/Self Care Home Management;Cryotherapy;Electrical Stimulation;Iontophoresis 4mg /ml Dexamethasone;Moist Heat;Gait training;Stair training;Functional mobility training;Therapeutic activities;Neuromuscular re-education;Balance training;Therapeutic exercise;Patient/family education;Manual techniques;Dry needling;Taping    PT Next Visit Plan Review shoulder/bicep HEP updates as needed, continue LE strengthening with clsoed and open chain activities as tolerated    PT Home Exercise Plan Access code: [email or web address]: heel raises, standing hip abduction, sit<>stand from edge of bed, LAQ with Theraband, heel slides, side clams, bridge, SLR, partial squat at counter as potential alternative to sit<>stand, Bicep eccentric, shoulder Theraband ER/IR, Theraband shoulder ext and row, sleeper stretch at wall    Consulted and Agree with Plan of Care Patient           Patient will benefit from skilled therapeutic intervention in order to improve the following deficits and impairments:  Difficulty walking,Hypomobility,Pain,Impaired flexibility,Decreased strength,Decreased activity tolerance,Decreased range of motion  Visit Diagnosis: Pain in left lower leg  Acute pain of right shoulder  Chronic pain of left knee  Chronic  pain of right knee  Other abnormalities of gait and mobility  Muscle weakness (generalized)     Problem List Patient Active Problem List   Diagnosis Date Noted  . Proteinuria 07/10/2019  . Unilateral primary osteoarthritis, left knee 09/13/2016  . Encounter for gynecological examination without abnormal finding 07/27/2016  . Sprain of medial collateral ligament of left knee 06/07/2016  . Chronic pain of left knee 06/02/2016   . Bipolar disorder, curr episode mixed, severe, with psychotic features ([place]) 08/26/2015  . History of posttraumatic stress disorder (PTSD) 08/26/2015  . Essential hypertension 08/26/2015  . History of eczema 04/08/2015  . Pedal edema 08/06/2014  . Positive ANA (antinuclear antibody) 12/19/2012  . Biological false positive RPR test 08/18/2012  . HYPERCHOLESTEROLEMIA 04/28/2009  . HIDRADENITIS SUPPURATIVA 09/06/2007  . OBESITY 04/17/2007    Beaulah Dinning, PT, DPT 07/21/20 11:25 AM  [facility] [address] [place], [place], [address] Phone: [phone]   Fax:  [phone]  Name: Stacie Daugherty MRN: [id number] Date of Birth: 26-Feb-1961

## 2020-07-23 ENCOUNTER — Ambulatory Visit: Payer: Medicare Other | Admitting: Physical Therapy

## 2020-07-23 ENCOUNTER — Encounter: Payer: Self-pay | Admitting: Physical Therapy

## 2020-07-23 ENCOUNTER — Other Ambulatory Visit: Payer: Self-pay

## 2020-07-23 DIAGNOSIS — M6281 Muscle weakness (generalized): Secondary | ICD-10-CM

## 2020-07-23 DIAGNOSIS — G8929 Other chronic pain: Secondary | ICD-10-CM | POA: Diagnosis not present

## 2020-07-23 DIAGNOSIS — R2689 Other abnormalities of gait and mobility: Secondary | ICD-10-CM | POA: Diagnosis not present

## 2020-07-23 DIAGNOSIS — M25561 Pain in right knee: Secondary | ICD-10-CM

## 2020-07-23 DIAGNOSIS — M79662 Pain in left lower leg: Secondary | ICD-10-CM | POA: Diagnosis not present

## 2020-07-23 DIAGNOSIS — M25562 Pain in left knee: Secondary | ICD-10-CM | POA: Diagnosis not present

## 2020-07-23 DIAGNOSIS — M25511 Pain in right shoulder: Secondary | ICD-10-CM

## 2020-07-23 NOTE — Therapy (Signed)
[place], [place], [address] Phone: [phone]   Fax:  [phone]  Physical Therapy Treatment  Patient Details  Name: Stacie Daugherty MRN: [id number] Date of Birth: 04-08-1961 Referring Provider (PT): Dionisio David, NP   Encounter Date: 07/23/2020   PT End of Session - 07/23/20 1021    Visit Number 7    Number of Visits 17    Date for PT Re-Evaluation 09/01/20    Authorization Type UHC MCR/MCD secondary, progress note by visit 16 due to re-eval at visit 6, recheck FOTO at visit 10    PT Start Time 1018    PT Stop Time 1059    PT Time Calculation (min) 41 min    Activity Tolerance Patient tolerated treatment well    Behavior During Therapy [facility] for tasks assessed/performed           Past Medical History:  Diagnosis Date  . Allergy    mild  . Anxiety    bipolar   . Arthritis    both knees  . Bipolar 1 disorder ([place])    Hospitalized multiple times for bipolar disorder (DC - [facility], [facility], and [facility], most recently in [place])  . Depression    bipolar  . Hidradenitis suppurativa   . Hypertension     Past Surgical History:  Procedure Laterality Date  . BREAST BIOPSY Left 2012   benign  . COLOSTOMY  07/06/2015  . INDUCED ABORTION     in patient's 79s in [place], [place].  . WISDOM TOOTH EXTRACTION      There were no vitals filed for this visit.   Subjective Assessment - 07/23/20 1020    Subjective No new complaints/concerns since re-eval visit earlier this week.                             [place] Adult PT Treatment/Exercise - 07/23/20 0001      Knee/Hip Exercises: Aerobic   Nustep L5 x 5 min UE/LE      Knee/Hip Exercises: Standing   Hip Abduction AROM;Stengthening;Both;15 reps    Abduction Limitations green band    Forward Step Up Right;Left;15 reps;Hand Hold: 2;Step Height: 6"    Functional Squat 2 sets;10  reps    Functional Squat Limitations squat at counter with bilat. UE support      Shoulder Exercises: Standing   External Rotation AROM;Strengthening;Right;15 reps    Theraband Level (Shoulder External Rotation) Level 3 (Green)    Internal Rotation AROM;Strengthening;15 reps    Theraband Level (Shoulder Internal Rotation) Level 3 (Green)    Flexion AROM;Strengthening;Right;20 reps    Shoulder Flexion Weight (lbs) 2    ABduction AROM;Strengthening;Right;20 reps    Shoulder ABduction Weight (lbs) 1    ABduction Limitations scaption right side 2x10    Extension AROM;Strengthening;Both;15 reps    Theraband Level (Shoulder Extension) Level 3 (Green)    Row AROM;Strengthening;Both;15 reps    Theraband Level (Shoulder Row) Level 4 (Blue)    Other Standing Exercises right bicep eccneitrc 4 lbs. 2x10      Manual Therapy   Manual Therapy Joint mobilization;Soft tissue mobilization    Joint Mobilization AP mons right shoulder grade I-III    Soft tissue mobilization right posterior shoulder                       PT Long Term Goals - 07/21/20  [place] #1   Title Independent with HEP    Baseline updates ongoing-added shoulder exercises 07/21/20    Time 6    Period Weeks    Status On-going    Target Date 09/01/20      PT LONG TERM GOAL #2   Title Pt. will improve FOTO score to 46% or less impairment    Baseline 57% limited/43% function as of 07/09/20    Time 6    Period Weeks    Status On-going    Target Date 09/01/20      PT LONG TERM GOAL #3   Title Increase left knee flexion AROM to 110 deg or greater to improve ability for transfers from low seats and for stair navigation    Baseline 110 deg    Time 6    Period Weeks    Status Achieved      PT LONG TERM GOAL #4   Title Increase left knee strength to 5/5 to improve ability to navigate steps at condo entrance and to improve ability for transfers from low seats    Baseline 4+/5 flexion, 5/5  extension    Time 6    Period Weeks    Status Partially Met    Target Date 09/01/20      PT LONG TERM GOAL #5   Title Perform sit<>stand transfers from couch with left knee pain symptoms decreased 50% or greater form baseline status    Baseline still ongoing    Time 6    Period Weeks    Status On-going    Target Date 09/01/20      Additional Long Term Goals   Additional Long Term Goals Yes      PT LONG TERM GOAL #6   Title Increase right shoulder strength to 5/5 for flexion and abduction to improve ability for lifting for chores    Baseline 4+/5    Time 6    Period Weeks    Status New    Target Date 09/01/20      PT LONG TERM GOAL #7   Title Decrease shoulder/elbow pain and symptoms at least 50% from baseline status for reaching behind back for dressing/bathing and for extending elbow without pain    Time 6    Period Weeks    Status New    Target Date 09/01/20                 Plan - 07/23/20 1037    Clinical Impression Statement Continued tx. emphasis both LE/knee and shoulder strengthening with biceps eccentrics to address potential tendinopathy. Able to progress step up height to 6 in. today with good tolerance. Progress re: therapy goals still ongoing with gradual progress expected given symptom etiology and comorbidities.    Personal Factors and Comorbidities Comorbidity 2    Comorbidities obesity, history knee OA, depression history    Examination-Activity Limitations Lift;Squat;Bend;Stairs;Locomotion Level;Stand    Examination-Participation Restrictions Cleaning;Laundry;Shop;Community Activity    Stability/Clinical Decision Making Evolving/Moderate complexity    Clinical Decision Making Moderate    Rehab Potential Good    PT Frequency 2x / week    PT Duration 6 weeks    PT Treatment/Interventions ADLs/Self Care Home Management;Cryotherapy;Electrical Stimulation;Iontophoresis 80m/ml Dexamethasone;Moist Heat;Gait training;Stair training;Functional mobility  training;Therapeutic activities;Neuromuscular re-education;Balance training;Therapeutic exercise;Patient/family education;Manual techniques;Dry needling;Taping    PT Next Visit Plan HEP updates as needed, continue LE strengthening with closed and open chain activities as tolerated, continue shoulder strengthening, bicep  eccentrics    PT Home Exercise Plan Access code: [email or web address]: heel raises, standing hip abduction, sit<>stand from edge of bed, LAQ with Theraband, heel slides, side clams, bridge, SLR, partial squat at counter as potential alternative to sit<>stand, Bicep eccentric, shoulder Theraband ER/IR, Theraband shoulder ext and row, sleeper stretch at wall    Consulted and Agree with Plan of Care Patient           Patient will benefit from skilled therapeutic intervention in order to improve the following deficits and impairments:  Difficulty walking,Hypomobility,Pain,Impaired flexibility,Decreased strength,Decreased activity tolerance,Decreased range of motion  Visit Diagnosis: Acute pain of right shoulder  Pain in left lower leg  Chronic pain of left knee  Chronic pain of right knee  Other abnormalities of gait and mobility  Muscle weakness (generalized)     Problem List Patient Active Problem List   Diagnosis Date Noted  . Proteinuria 07/10/2019  . Unilateral primary osteoarthritis, left knee 09/13/2016  . Encounter for gynecological examination without abnormal finding 07/27/2016  . Sprain of medial collateral ligament of left knee 06/07/2016  . Chronic pain of left knee 06/02/2016  . Bipolar disorder, curr episode mixed, severe, with psychotic features ([place]) 08/26/2015  . History of posttraumatic stress disorder (PTSD) 08/26/2015  . Essential hypertension 08/26/2015  . History of eczema 04/08/2015  . Pedal edema 08/06/2014  . Positive ANA (antinuclear antibody) 12/19/2012  . Biological false positive RPR test 08/18/2012  . HYPERCHOLESTEROLEMIA 04/28/2009  .  HIDRADENITIS SUPPURATIVA 09/06/2007  . OBESITY 04/17/2007    Beaulah Dinning, PT, DPT 07/23/20 10:59 AM  [facility] [address] [place], [place], [address] Phone: [phone]   Fax:  [phone]  Name: Stacie Daugherty MRN: [id number] Date of Birth: 11-Jul-1960

## 2020-07-28 ENCOUNTER — Encounter: Payer: Medicare Other | Admitting: Physical Therapy

## 2020-07-30 ENCOUNTER — Ambulatory Visit: Payer: Medicare Other | Attending: Nurse Practitioner | Admitting: Physical Therapy

## 2020-07-30 ENCOUNTER — Encounter: Payer: Self-pay | Admitting: Sports Medicine

## 2020-07-30 ENCOUNTER — Other Ambulatory Visit: Payer: Self-pay

## 2020-07-30 ENCOUNTER — Encounter: Payer: Self-pay | Admitting: Physical Therapy

## 2020-07-30 ENCOUNTER — Ambulatory Visit (INDEPENDENT_AMBULATORY_CARE_PROVIDER_SITE_OTHER): Payer: Medicare Other | Admitting: Sports Medicine

## 2020-07-30 DIAGNOSIS — M25562 Pain in left knee: Secondary | ICD-10-CM | POA: Diagnosis not present

## 2020-07-30 DIAGNOSIS — G8929 Other chronic pain: Secondary | ICD-10-CM | POA: Insufficient documentation

## 2020-07-30 DIAGNOSIS — I739 Peripheral vascular disease, unspecified: Secondary | ICD-10-CM

## 2020-07-30 DIAGNOSIS — M6281 Muscle weakness (generalized): Secondary | ICD-10-CM

## 2020-07-30 DIAGNOSIS — M79662 Pain in left lower leg: Secondary | ICD-10-CM

## 2020-07-30 DIAGNOSIS — R2689 Other abnormalities of gait and mobility: Secondary | ICD-10-CM | POA: Diagnosis not present

## 2020-07-30 DIAGNOSIS — L853 Xerosis cutis: Secondary | ICD-10-CM | POA: Diagnosis not present

## 2020-07-30 DIAGNOSIS — M25511 Pain in right shoulder: Secondary | ICD-10-CM | POA: Insufficient documentation

## 2020-07-30 DIAGNOSIS — M25561 Pain in right knee: Secondary | ICD-10-CM | POA: Insufficient documentation

## 2020-07-30 DIAGNOSIS — B351 Tinea unguium: Secondary | ICD-10-CM | POA: Diagnosis not present

## 2020-07-30 NOTE — Progress Notes (Signed)
Subjective: Stacie Daugherty is a 60 y.o. female patient seen today in office for nail check and reports that she finished her oxiconazole medicine about a month ago.  Patient reports that she has not really seen much of a difference especially at her right great toenail at this point.  Patient Active Problem List   Diagnosis Date Noted  . Proteinuria 07/10/2019  . Unilateral primary osteoarthritis, left knee 09/13/2016  . Encounter for gynecological examination without abnormal finding 07/27/2016  . Sprain of medial collateral ligament of left knee 06/07/2016  . Chronic pain of left knee 06/02/2016  . Bipolar disorder, curr episode mixed, severe, with psychotic features ([place]) 08/26/2015  . History of posttraumatic stress disorder (PTSD) 08/26/2015  . Essential hypertension 08/26/2015  . History of eczema 04/08/2015  . Pedal edema 08/06/2014  . Positive ANA (antinuclear antibody) 12/19/2012  . Biological false positive RPR test 08/18/2012  . HYPERCHOLESTEROLEMIA 04/28/2009  . HIDRADENITIS SUPPURATIVA 09/06/2007  . OBESITY 04/17/2007    Current Outpatient Medications on File Prior to Visit  Medication Sig Dispense Refill  . ABILIFY MAINTENA 400 MG PRSY prefilled syringe SMARTSIG:100 Milligram(s) IM Every 4 Weeks    . atorvastatin (LIPITOR) 20 MG tablet Take 1 tablet (20 mg total) by mouth daily. 90 tablet 3  . carbamazepine (TEGRETOL) 200 MG tablet Take 1 tablet (200 mg total) by mouth 2 (two) times daily at 8 am and 10 pm. 60 tablet 0  . carvedilol (COREG) 3.125 MG tablet Take 1 tablet (3.125 mg total) by mouth daily. 90 tablet 3  . chlorhexidine (PERIDEX) 0.12 % solution 15 mLs 2 (two) times daily.    . Cholecalciferol (VITAMIN D3) 25 MCG (1000 UT) CAPS Take 1,000 Units by mouth daily.    . divalproex (DEPAKOTE) 500 MG DR tablet Take 500 mg by mouth 2 (two) times daily.    Marland Kitchen gabapentin (NEURONTIN) 300 MG capsule Take 1 capsule (300 mg total) by mouth 3 (three) times  daily at 8am, 2pm and bedtime. For agitation 90 capsule 0  . losartan-hydrochlorothiazide (HYZAAR) 50-12.5 MG tablet Take 1 tablet by mouth daily. 90 tablet 3  . MELATONIN PO Take 1 tablet by mouth at bedtime.    . Multiple Vitamin (MULTIVITAMIN PO) Take by mouth.    . SODIUM FLUORIDE 5000 PPM 1.1 % PSTE     . vitamin C (ASCORBIC ACID) 500 MG tablet Take 500 mg by mouth daily.     No current facility-administered medications on file prior to visit.    Allergies  Allergen Reactions  . Haldol [Haloperidol Lactate] Other (See Comments)    Pt  Just doesn't like because she cant remember anything the next day.     Objective: Physical Exam  General: Well developed, nourished, no acute distress, awake, alert and oriented x 3  Dermatology: No open lesions bilateral lower extremities, no webspace macerations, no ecchymosis bilateral, all nails x 10 are short and thick with subungal debris consistent with onychomycosis no obvious clearance at nail beds at this point. Dry skin/callus plantar surfaces bilateral.   Vascular: Dorsalis Pedis and Posterior Tibial pedal pulses palpable, Capillary Fill Time 3 seconds,(+) pedal hair growth bilateral, Trace edema bilateral lower extremities, Temperature gradient within normal limits.  Neurology: Gross sensation intact via light touch bilateral.   Musculoskeletal:+ Pes planus bilateral.   Assessment and Plan:  Problem List Items Addressed This Visit   None   Visit Diagnoses    Onychomycosis    -  Primary  PVD (peripheral vascular disease) ([place])       Dry skin          -Examined patient -Discussed treatment options for painful mycotic nails -Advised patient that since she just has recently finished I itraconazole in May may take more time to see any proximal clearance of her toenails -May try tea tree oil and soaking with vinegar as directed -Advised to try daily skin emollients for dry skin/callus -Patient to return as scheduled with Dr.  Adah Perl for routine nail care  Landis Martins, DPM

## 2020-07-30 NOTE — Patient Instructions (Signed)
Vinegar soaks 1 cup of white distilled vinegar to 8 cups of warm water.  Soak 20 mins. May repeat soak two times per week.  If there is thickness to nails may file nails after soaks or after bath/shower with nail file and apply tea tree oil. Apply oil daily to nails after filing for the best result.  

## 2020-07-30 NOTE — Therapy (Signed)
[place], [place], [address] Phone: [phone]   Fax:  [phone]  Physical Therapy Treatment  Patient Details  Name: Stacie Daugherty MRN: [id number] Date of Birth: [birth year] Referring Provider (PT): Dionisio David, NP   Encounter Date: 07/30/2020   PT End of Session - 07/30/20 1300    Visit Number 8    Number of Visits 17    Date for PT Re-Evaluation 09/01/20    Authorization Type UHC MCR/MCD secondary, progress note by visit 16 due to re-eval at visit 6, recheck FOTO at visit 10    PT Start Time 1149    PT Stop Time 1229    PT Time Calculation (min) 40 min    Activity Tolerance Patient tolerated treatment well    Behavior During Therapy [facility] for tasks assessed/performed           Past Medical History:  Diagnosis Date  . Allergy    mild  . Anxiety    bipolar   . Arthritis    both knees  . Bipolar 1 disorder ([place])    Hospitalized multiple times for bipolar disorder (DC - [facility], [facility], and [facility], most recently in [place])  . Depression    bipolar  . Hidradenitis suppurativa   . Hypertension     Past Surgical History:  Procedure Laterality Date  . BREAST BIOPSY Left 2012   benign  . COLOSTOMY  07/06/2015  . INDUCED ABORTION     in patient's 39s in [place], [place].  . WISDOM TOOTH EXTRACTION      There were no vitals filed for this visit.   Subjective Assessment - 07/30/20 1254    Subjective Pt. wishes to focus on knee today-she localizes primary pain with car transfers in posterior proximal gastroc region-she thinks may have pulled a muscle in this area but pain has been persistent/chronic.    Pertinent History knee OA, obesity, history bipolar/depression    Currently in Pain? Yes    Pain Score 6     Pain Location Knee    Pain Orientation Left;Posterior;Proximal;Lateral   proximal lateral gastroc region   Pain Descriptors /  Indicators Sharp    Pain Type Chronic pain    Pain Onset More than a month ago    Pain Frequency Intermittent    Aggravating Factors  car transfers, worse in AM    Pain Relieving Factors getting moving    Effect of Pain on Daily Activities increased difficulty with car transfers                             [facility] Adult PT Treatment/Exercise - 07/30/20 0001      Knee/Hip Exercises: Stretches   Gastroc Stretch Left;3 reps;30 seconds    Gastroc Stretch Limitations standing stretch at counter      Knee/Hip Exercises: Standing   Heel Raises Both;2 sets;10 reps    Hip Abduction AROM;Stengthening;Right;Left;2 sets;10 reps    Abduction Limitations 3 lb. ankle weights    Lateral Step Up Left;Right;2 sets;10 reps;Hand Hold: 2;Step Height: 4"    Forward Step Up Right;Left;2 sets;10 reps;Hand Hold: 2;Step Height: 6"      Knee/Hip Exercises: Seated   Long Arc Quad AROM;Strengthening;Left;2 sets;10 reps    Long Arc Quad Weight 5 lbs.      Knee/Hip Exercises: Supine   Short Arc Quad Sets AROM;Strengthening;Left;2 sets;10 reps    Short  Arc [employer] Limitations 3 lbs.    Bridges AROM;Strengthening;Both;2 sets;10 reps    Bridges Limitations legs on reversed incline wedge      Manual Therapy   Soft tissue mobilization STM/IASM with roller left gastroc focus proximal lateral gastroc/soleus, STM right bicep                  PT Education - 07/30/20 1300    Education Details HEP updates, potential symptom etiology    Person(s) Educated Patient    Methods Explanation    Comprehension Verbalized understanding               PT Long Term Goals - 07/21/20 1121      PT LONG TERM GOAL #1   Title Independent with HEP    Baseline updates ongoing-added shoulder exercises 07/21/20    Time 6    Period Weeks    Status On-going    Target Date 09/01/20      PT LONG TERM GOAL #2   Title Pt. will improve FOTO score to 46% or less impairment    Baseline 57% limited/43%  function as of 07/09/20    Time 6    Period Weeks    Status On-going    Target Date 09/01/20      PT LONG TERM GOAL #3   Title Increase left knee flexion AROM to 110 deg or greater to improve ability for transfers from low seats and for stair navigation    Baseline 110 deg    Time 6    Period Weeks    Status Achieved      PT LONG TERM GOAL #4   Title Increase left knee strength to 5/5 to improve ability to navigate steps at condo entrance and to improve ability for transfers from low seats    Baseline 4+/5 flexion, 5/5 extension    Time 6    Period Weeks    Status Partially Met    Target Date 09/01/20      PT LONG TERM GOAL #5   Title Perform sit<>stand transfers from couch with left knee pain symptoms decreased 50% or greater form baseline status    Baseline still ongoing    Time 6    Period Weeks    Status On-going    Target Date 09/01/20      Additional Long Term Goals   Additional Long Term Goals Yes      PT LONG TERM GOAL #6   Title Increase right shoulder strength to 5/5 for flexion and abduction to improve ability for lifting for chores    Baseline 4+/5    Time 6    Period Weeks    Status New    Target Date 09/01/20      PT LONG TERM GOAL #7   Title Decrease shoulder/elbow pain and symptoms at least 50% from baseline status for reaching behind back for dressing/bathing and for extending elbow without pain    Time 6    Period Weeks    Status New    Target Date 09/01/20                 Plan - 07/30/20 1301    Clinical Impression Statement Pt. localized knee region discomfort today in left proximal lateral gastroc region-concordant pain noted with palpation with trigger point/muscle tension so included manual therapy and stretches to address and will await further tx. response-though underlying OA for knee symptoms and presentation for this consistent with muscular etiology  but will continue to monitor.    Personal Factors and Comorbidities Comorbidity 2     Comorbidities obesity, history knee OA, depression history    Examination-Activity Limitations Lift;Squat;Bend;Stairs;Locomotion Level;Stand    Examination-Participation Restrictions Cleaning;Laundry;Shop;Community Activity    Stability/Clinical Decision Making Evolving/Moderate complexity    Clinical Decision Making Moderate    Rehab Potential Good    PT Frequency 2x / week    PT Duration 6 weeks    PT Treatment/Interventions ADLs/Self Care Home Management;Cryotherapy;Electrical Stimulation;Iontophoresis 4mg /ml Dexamethasone;Moist Heat;Gait training;Stair training;Functional mobility training;Therapeutic activities;Neuromuscular re-education;Balance training;Therapeutic exercise;Patient/family education;Manual techniques;Dry needling;Taping    PT Next Visit Plan Check response to manual and stretches to left gastroc region for pain with car transfers, continue LE strengthening focus, tx. for strengthening/eccentric and manual to right bicep region and shoulder prn    PT Home Exercise Plan Access code: [email or web address]: heel raises, standing hip abduction, sit<>stand from edge of bed, LAQ with Theraband, heel slides, side clams, bridge, SLR, partial squat at counter as potential alternative to sit<>stand, Bicep eccentric, shoulder Theraband ER/IR, Theraband shoulder ext and row, sleeper stretch at wall, gastroc stretch in standing    Consulted and Agree with Plan of Care Patient           Patient will benefit from skilled therapeutic intervention in order to improve the following deficits and impairments:  Difficulty walking,Hypomobility,Pain,Impaired flexibility,Decreased strength,Decreased activity tolerance,Decreased range of motion  Visit Diagnosis: Acute pain of right shoulder  Pain in left lower leg  Chronic pain of left knee  Chronic pain of right knee  Other abnormalities of gait and mobility  Muscle weakness (generalized)     Problem List Patient Active Problem List    Diagnosis Date Noted  . Proteinuria 07/10/2019  . Unilateral primary osteoarthritis, left knee 09/13/2016  . Encounter for gynecological examination without abnormal finding 07/27/2016  . Sprain of medial collateral ligament of left knee 06/07/2016  . Chronic pain of left knee 06/02/2016  . Bipolar disorder, curr episode mixed, severe, with psychotic features ([place]) 08/26/2015  . History of posttraumatic stress disorder (PTSD) 08/26/2015  . Essential hypertension 08/26/2015  . History of eczema 04/08/2015  . Pedal edema 08/06/2014  . Positive ANA (antinuclear antibody) 12/19/2012  . Biological false positive RPR test 08/18/2012  . HYPERCHOLESTEROLEMIA 04/28/2009  . HIDRADENITIS SUPPURATIVA 09/06/2007  . OBESITY 04/17/2007    Beaulah Dinning, PT, DPT 07/30/20 1:06 PM  [facility] [address] [place], [place], [address] Phone: [phone]   Fax:  [phone]  Name: Norina Cowper MRN: [id number] Date of Birth: 01/01/1961

## 2020-08-04 ENCOUNTER — Ambulatory Visit: Payer: Medicare Other | Admitting: Physical Therapy

## 2020-08-04 ENCOUNTER — Encounter: Payer: Self-pay | Admitting: Physical Therapy

## 2020-08-04 ENCOUNTER — Other Ambulatory Visit: Payer: Self-pay

## 2020-08-04 DIAGNOSIS — G8929 Other chronic pain: Secondary | ICD-10-CM

## 2020-08-04 DIAGNOSIS — M79662 Pain in left lower leg: Secondary | ICD-10-CM

## 2020-08-04 DIAGNOSIS — R2689 Other abnormalities of gait and mobility: Secondary | ICD-10-CM

## 2020-08-04 DIAGNOSIS — M6281 Muscle weakness (generalized): Secondary | ICD-10-CM

## 2020-08-04 DIAGNOSIS — M25562 Pain in left knee: Secondary | ICD-10-CM | POA: Diagnosis not present

## 2020-08-04 DIAGNOSIS — M25561 Pain in right knee: Secondary | ICD-10-CM | POA: Diagnosis not present

## 2020-08-04 DIAGNOSIS — M25511 Pain in right shoulder: Secondary | ICD-10-CM | POA: Diagnosis not present

## 2020-08-04 NOTE — Therapy (Signed)
[place], [place], [address] Phone: [phone]   Fax:  [phone]  Physical Therapy Treatment  Patient Details  Name: Stacie Daugherty MRN: [id number] Date of Birth: 05/02/1961 Referring Provider (PT): Dionisio David, NP   Encounter Date: 08/04/2020   PT End of Session - 08/04/20 1048    Visit Number 9    Number of Visits 17    Date for PT Re-Evaluation 09/01/20    Authorization Type UHC MCR/MCD secondary, progress note by visit 16 due to re-eval at visit 6, recheck FOTO at visit 10    PT Start Time 1015    PT Stop Time 1057    PT Time Calculation (min) 42 min    Activity Tolerance Patient tolerated treatment well    Behavior During Therapy [place] for tasks assessed/performed           Past Medical History:  Diagnosis Date  . Allergy    mild  . Anxiety    bipolar   . Arthritis    both knees  . Bipolar 1 disorder ([place])    Hospitalized multiple times for bipolar disorder ([facility] - [facility], [facility], and [facility], most recently in [place])  . Depression    bipolar  . Hidradenitis suppurativa   . Hypertension     Past Surgical History:  Procedure Laterality Date  . BREAST BIOPSY Left 2012   benign  . COLOSTOMY  07/06/2015  . INDUCED ABORTION     in patient's 52s in [place], [place].  . WISDOM TOOTH EXTRACTION      There were no vitals filed for this visit.   Subjective Assessment - 08/04/20 1013    Subjective Manual work and stretching to left calf may have helped some (with pain with car transfers) but hard to tell. Has been trying to emphasis using both legs more with car transfers to avoid pain. Mild improvement in right shoulder/elbow region pain and also mild improvement with "popping" in right shoulder reaching behind back.                             [facility] Adult PT Treatment/Exercise - 08/04/20 0001      Exercises    Exercises Knee/Hip      Knee/Hip Exercises: Machines for Strengthening   Cybex Leg Press 45 lbs. bilat. LE 2x10      Knee/Hip Exercises: Standing   Heel Raises Both;2 sets;10 reps    Lateral Step Up Left;Right;2 sets;10 reps;Hand Hold: 2;Step Height: 4"    Forward Step Up Right;Left;2 sets;10 reps;Hand Hold: 2;Step Height: 6"    Functional Squat Limitations TRX partial squat 2x10      Knee/Hip Exercises: Seated   Long Arc Quad AROM;Strengthening;Left;2 sets;10 reps;Right    Long Arc Quad Weight 5 lbs.    Hamstring Curl Right;Left;2 sets;10 reps    Hamstring Limitations blue band      Knee/Hip Exercises: Supine   Short Arc Quad Sets AROM;Strengthening;Left;2 sets;10 reps    Short Arc Quad Sets Limitations 3 lbs.    Hip Adduction Isometric Both;1 set;15 reps    Hip Adduction Isometric Limitations 5 sec holds with ball    Bridges AROM;Strengthening;Both;2 sets;10 reps    Bridges Limitations legs on reversed incline wedge    Other Supine Knee/Hip Exercises clamshell blue band 2x10      Shoulder Exercises: Standing   Other Standing Exercises Right bicep  curl eccentrix 2x10 with 5 lbs.      Manual Therapy   Joint Mobilization right GH joint AP mobs grade I-III    Soft tissue mobilization STM/IASM with roller left gastroc focus proximal lateral gastroc/soleus, STM right bicep                       PT Long Term Goals - 07/21/20 1121      PT LONG TERM GOAL #1   Title Independent with HEP    Baseline updates ongoing-added shoulder exercises 07/21/20    Time 6    Period Weeks    Status On-going    Target Date 09/01/20      PT LONG TERM GOAL #2   Title Pt. will improve FOTO score to 46% or less impairment    Baseline 57% limited/43% function as of 07/09/20    Time 6    Period Weeks    Status On-going    Target Date 09/01/20      PT LONG TERM GOAL #3   Title Increase left knee flexion AROM to 110 deg or greater to improve ability for transfers from low seats and  for stair navigation    Baseline 110 deg    Time 6    Period Weeks    Status Achieved      PT LONG TERM GOAL #4   Title Increase left knee strength to 5/5 to improve ability to navigate steps at condo entrance and to improve ability for transfers from low seats    Baseline 4+/5 flexion, 5/5 extension    Time 6    Period Weeks    Status Partially Met    Target Date 09/01/20      PT LONG TERM GOAL #5   Title Perform sit<>stand transfers from couch with left knee pain symptoms decreased 50% or greater form baseline status    Baseline still ongoing    Time 6    Period Weeks    Status On-going    Target Date 09/01/20      Additional Long Term Goals   Additional Long Term Goals Yes      PT LONG TERM GOAL #6   Title Increase right shoulder strength to 5/5 for flexion and abduction to improve ability for lifting for chores    Baseline 4+/5    Time 6    Period Weeks    Status New    Target Date 09/01/20      PT LONG TERM GOAL #7   Title Decrease shoulder/elbow pain and symptoms at least 50% from baseline status for reaching behind back for dressing/bathing and for extending elbow without pain    Time 6    Period Weeks    Status New    Target Date 09/01/20                 Plan - 08/04/20 1049    Clinical Impression Statement Continued previous tx. emphasis with inclusion manual therapy as noted per flowsheet. Still with local proximal lateral gastroc and soleus muscle tightness but difficulty assessing with palpation how much pain was concordant-will continue to assess tx. response and symptoms. Otherwise making mild gains as noted in subjective with shoulder/elbow symptoms and LTGs still ongoing.    Personal Factors and Comorbidities Comorbidity 2    Comorbidities obesity, history knee OA, depression history    Examination-Activity Limitations Lift;Squat;Bend;Stairs;Locomotion Level;Stand    Examination-Participation Restrictions Cleaning;Laundry;Shop;Community Activity     Stability/Clinical Decision  Making Evolving/Moderate complexity    Clinical Decision Making Moderate    Rehab Potential Good    PT Frequency 2x / week    PT Duration 6 weeks    PT Treatment/Interventions ADLs/Self Care Home Management;Cryotherapy;Electrical Stimulation;Iontophoresis 20m/ml Dexamethasone;Moist Heat;Gait training;Stair training;Functional mobility training;Therapeutic activities;Neuromuscular re-education;Balance training;Therapeutic exercise;Patient/family education;Manual techniques;Dry needling;Taping    PT Next Visit Plan Continue to check status for concordant pain left knee/gastroc region, continue exercises progression focus knee but include shoulder/bicep as needed    PT Home Exercise Plan Access code: [email or web address]: heel raises, standing hip abduction, sit<>stand from edge of bed, LAQ with Theraband, heel slides, side clams, bridge, SLR, partial squat at counter as potential alternative to sit<>stand, Bicep eccentric, shoulder Theraband ER/IR, Theraband shoulder ext and row, sleeper stretch at wall, gastroc stretch in standing    Consulted and Agree with Plan of Care Patient           Patient will benefit from skilled therapeutic intervention in order to improve the following deficits and impairments:  Difficulty walking,Hypomobility,Pain,Impaired flexibility,Decreased strength,Decreased activity tolerance,Decreased range of motion  Visit Diagnosis: Acute pain of right shoulder  Pain in left lower leg  Chronic pain of left knee  Chronic pain of right knee  Other abnormalities of gait and mobility  Muscle weakness (generalized)     Problem List Patient Active Problem List   Diagnosis Date Noted  . Proteinuria 07/10/2019  . Unilateral primary osteoarthritis, left knee 09/13/2016  . Encounter for gynecological examination without abnormal finding 07/27/2016  . Sprain of medial collateral ligament of left knee 06/07/2016  . Chronic pain of left knee 06/02/2016   . Bipolar disorder, curr episode mixed, severe, with psychotic features ([place] 08/26/2015  . History of posttraumatic stress disorder (PTSD) 08/26/2015  . Essential hypertension 08/26/2015  . History of eczema 04/08/2015  . Pedal edema 08/06/2014  . Positive ANA (antinuclear antibody) 12/19/2012  . Biological false positive RPR test 08/18/2012  . HYPERCHOLESTEROLEMIA 04/28/2009  . HIDRADENITIS SUPPURATIVA 09/06/2007  . OBESITY 04/17/2007    CBeaulah Dinning PT, DPT 08/04/20 11:00 AM  [facility] [address]Phone: [phone]  Fax:  [phone] Name: Stacie JustissMRN: [id number] of Birth: [birth year]

## 2020-08-06 ENCOUNTER — Ambulatory Visit: Payer: Medicare Other | Admitting: Physical Therapy

## 2020-08-06 ENCOUNTER — Other Ambulatory Visit: Payer: Self-pay

## 2020-08-06 ENCOUNTER — Encounter: Payer: Self-pay | Admitting: Physical Therapy

## 2020-08-06 DIAGNOSIS — M25562 Pain in left knee: Secondary | ICD-10-CM | POA: Diagnosis not present

## 2020-08-06 DIAGNOSIS — G8929 Other chronic pain: Secondary | ICD-10-CM | POA: Diagnosis not present

## 2020-08-06 DIAGNOSIS — R2689 Other abnormalities of gait and mobility: Secondary | ICD-10-CM | POA: Diagnosis not present

## 2020-08-06 DIAGNOSIS — M6281 Muscle weakness (generalized): Secondary | ICD-10-CM

## 2020-08-06 DIAGNOSIS — M25561 Pain in right knee: Secondary | ICD-10-CM | POA: Diagnosis not present

## 2020-08-06 DIAGNOSIS — M25511 Pain in right shoulder: Secondary | ICD-10-CM | POA: Diagnosis not present

## 2020-08-06 DIAGNOSIS — M79662 Pain in left lower leg: Secondary | ICD-10-CM

## 2020-08-06 NOTE — Therapy (Signed)
[facility] [address] [place], , [address] Phone: [phone]   Fax:  [phone]  Physical Therapy Treatment  Patient Details  Name: Stacie Daugherty MRN: [id number] Date of Birth: 02/25/1961 Referring Provider (PT): Crystal King, NP   Encounter Date: 08/06/2020   PT End of Session - 08/06/20 1052    Visit Number 10    Number of Visits 17    Date for PT Re-Evaluation 09/01/20    Authorization Type UHC MCR/MCD secondary, progress note by visit 16 due to re-eval at visit 6, recheck FOTO at visit 10    PT Start Time 1017    PT Stop Time 1058    PT Time Calculation (min) 41 min    Activity Tolerance Patient tolerated treatment well    Behavior During Therapy WFL for tasks assessed/performed           Past Medical History:  Diagnosis Date  . Allergy    mild  . Anxiety    bipolar   . Arthritis    both knees  . Bipolar 1 disorder ([facility])    Hospitalized multiple times for bipolar disorder ([place] - [facility], [facility], and [facility], most recently in [place])  . Depression    bipolar  . Hidradenitis suppurativa   . Hypertension     Past Surgical History:  Procedure Laterality Date  . BREAST BIOPSY Left 2012   benign  . COLOSTOMY  07/06/2015  . INDUCED ABORTION     in patient's 20s in [place].  . WISDOM TOOTH EXTRACTION      There were no vitals filed for this visit.   Subjective Assessment - 08/06/20 1022    Subjective Mild ease in leg symptoms with trying to get out of the car using "both legs" otherwise no new omplaints or concerns since last visit. Pain in left side still noted most in posterior knee/proximal lateral gastroc region.    Currently in Pain? Yes    Pain Score 6     Pain Location Leg    Pain Orientation Right;Left    Pain Descriptors / Indicators Aching    Pain Type Chronic pain    Pain Onset More than a month ago    Pain Frequency  Intermittent    Aggravating Factors  car transfers, worse in AM    Pain Relieving Factors getting moving    Effect of Pain on Daily Activities increased difficulty with car transfers                             [facility] Adult PT Treatment/Exercise - 08/06/20 0001      Knee/Hip Exercises: Stretches   Gastroc Stretch Both;3 reps;30 seconds    Gastroc Stretch Limitations slant board stretch      Knee/Hip Exercises: Aerobic   Nustep L6 x 5 min UE/LE      Knee/Hip Exercises: Machines for Strengthening   Cybex Leg Press 45 lbs. bilat. LE 2x10      Knee/Hip Exercises: Standing   Heel Raises Both;2 sets;10 reps    Heel Raises Limitations on Airex    Forward Lunges Right;Left;15 reps    Forward Lunges Limitations partial lunge with 1 hand support on counter    Hip Abduction AROM;Stengthening;Right;Left;2 sets;10 reps    Abduction Limitations 3 lbs.    Hip Extension AROM;Stengthening;Right;Left;2 sets;10 reps    Extension Limitations 3 lb. ankle weights      Lateral Step Up Left;Right;2 sets;10 reps;Hand Hold: 2;Step Height: 6"    Forward Step Up Right;Left;2 sets;10 reps;Hand Hold: 2;Step Height: 6"    Functional Squat 2 sets;10 reps    Functional Squat Limitations squat at counter with bilat. UE support      Shoulder Exercises: Standing   External Rotation Limitations 2x10 right side 3 lbs. with Freemotion cable   verbal + tactile cues for form for angle ROM and to keep elbow at side   Internal Rotation Limitations 2x10 right side 3 lbs. Freemotion cable    Flexion AROM;Strengthening;Right;20 reps    Shoulder Flexion Weight (lbs) 2    ABduction AROM;Strengthening;Right;20 reps    Shoulder ABduction Weight (lbs) 2    ABduction Limitations scaption    Extension Limitations 2x10 Freemotion cable x 7 lbs.    Row Limitations 2x10 Freemotion cable 7 lbs.    Other Standing Exercises Right bicep curl eccentrix 2x10 with 5 lbs.   cues for increased elbow extension with  eccentric ROM                      PT Long Term Goals - 07/21/20 1121      PT LONG TERM GOAL #1   Title Independent with HEP    Baseline updates ongoing-added shoulder exercises 07/21/20    Time 6    Period Weeks    Status On-going    Target Date 09/01/20      PT LONG TERM GOAL #2   Title Pt. will improve FOTO score to 46% or less impairment    Baseline 57% limited/43% function as of 07/09/20    Time 6    Period Weeks    Status On-going    Target Date 09/01/20      PT LONG TERM GOAL #3   Title Increase left knee flexion AROM to 110 deg or greater to improve ability for transfers from low seats and for stair navigation    Baseline 110 deg    Time 6    Period Weeks    Status Achieved      PT LONG TERM GOAL #4   Title Increase left knee strength to 5/5 to improve ability to navigate steps at condo entrance and to improve ability for transfers from low seats    Baseline 4+/5 flexion, 5/5 extension    Time 6    Period Weeks    Status Partially Met    Target Date 09/01/20      PT LONG TERM GOAL #5   Title Perform sit<>stand transfers from couch with left knee pain symptoms decreased 50% or greater form baseline status    Baseline still ongoing    Time 6    Period Weeks    Status On-going    Target Date 09/01/20      Additional Long Term Goals   Additional Long Term Goals Yes      PT LONG TERM GOAL #6   Title Increase right shoulder strength to 5/5 for flexion and abduction to improve ability for lifting for chores    Baseline 4+/5    Time 6    Period Weeks    Status New    Target Date 09/01/20      PT LONG TERM GOAL #7   Title Decrease shoulder/elbow pain and symptoms at least 50% from baseline status for reaching behind back for dressing/bathing and for extending elbow without pain    Time 6    Period Weeks  Status New    Target Date 09/01/20                 Plan - 08/06/20 1058    Clinical Impression Statement Tx. focus exercises today  for both shoulder and knee/LEs. Able to progress "machine" strengthening exercises for shoulder with Freemotion cables and for LE able to progress lateral step ups from 4 in. height used at previous sessions to 6 in. with fatigue but good tolerance in terms of pain. Given underlying OA for knees still expect gradual progress but demonstrating improving strength from baseline status, mild improvement with car transfers but still noting pain/limitations with this.    Personal Factors and Comorbidities Comorbidity 2    Comorbidities obesity, history knee OA, depression history    Examination-Activity Limitations Lift;Squat;Bend;Stairs;Locomotion Level;Stand    Examination-Participation Restrictions Cleaning;Laundry;Shop;Community Activity    Stability/Clinical Decision Making Evolving/Moderate complexity    Clinical Decision Making Moderate    Rehab Potential Good    PT Frequency 2x / week    PT Duration 6 weeks    PT Treatment/Interventions ADLs/Self Care Home Management;Cryotherapy;Electrical Stimulation;Iontophoresis 4mg/ml Dexamethasone;Moist Heat;Gait training;Stair training;Functional mobility training;Therapeutic activities;Neuromuscular re-education;Balance training;Therapeutic exercise;Patient/family education;Manual techniques;Dry needling;Taping    PT Next Visit Plan Continue to check status for concordant pain left knee/gastroc region, continue exercises progression focus knee but include shoulder/bicep as needed    PT Home Exercise Plan Access code: [email or web address]: heel raises, standing hip abduction, sit<>stand from edge of bed, LAQ with Theraband, heel slides, side clams, bridge, SLR, partial squat at counter as potential alternative to sit<>stand, Bicep eccentric, shoulder Theraband ER/IR, Theraband shoulder ext and row, sleeper stretch at wall, gastroc stretch in standing    Consulted and Agree with Plan of Care Patient           Patient will benefit from skilled therapeutic intervention  in order to improve the following deficits and impairments:  Difficulty walking,Hypomobility,Pain,Impaired flexibility,Decreased strength,Decreased activity tolerance,Decreased range of motion  Visit Diagnosis: Acute pain of right shoulder  Pain in left lower leg  Chronic pain of left knee  Chronic pain of right knee  Other abnormalities of gait and mobility  Muscle weakness (generalized)     Problem List Patient Active Problem List   Diagnosis Date Noted  . Proteinuria 07/10/2019  . Unilateral primary osteoarthritis, left knee 09/13/2016  . Encounter for gynecological examination without abnormal finding 07/27/2016  . Sprain of medial collateral ligament of left knee 06/07/2016  . Chronic pain of left knee 06/02/2016  . Bipolar disorder, curr episode mixed, severe, with psychotic features ([facility]) 08/26/2015  . History of posttraumatic stress disorder (PTSD) 08/26/2015  . Essential hypertension 08/26/2015  . History of eczema 04/08/2015  . Pedal edema 08/06/2014  . Positive ANA (antinuclear antibody) 12/19/2012  . Biological false positive RPR test 08/18/2012  . HYPERCHOLESTEROLEMIA 04/28/2009  . HIDRADENITIS SUPPURATIVA 09/06/2007  . OBESITY 04/17/2007     , PT, DPT 08/06/20 11:02 AM  [facility] [address] [place], [place], [address] Phone: [phone]   Fax:  [phone]  Name: Stacie Daugherty MRN: [id number] Date of Birth: 03/15/1961   

## 2020-08-11 ENCOUNTER — Ambulatory Visit: Payer: Medicare Other | Admitting: Physical Therapy

## 2020-08-11 ENCOUNTER — Encounter: Payer: Self-pay | Admitting: Physical Therapy

## 2020-08-11 ENCOUNTER — Other Ambulatory Visit: Payer: Self-pay

## 2020-08-11 DIAGNOSIS — M25562 Pain in left knee: Secondary | ICD-10-CM

## 2020-08-11 DIAGNOSIS — M25511 Pain in right shoulder: Secondary | ICD-10-CM

## 2020-08-11 DIAGNOSIS — M6281 Muscle weakness (generalized): Secondary | ICD-10-CM

## 2020-08-11 DIAGNOSIS — R2689 Other abnormalities of gait and mobility: Secondary | ICD-10-CM | POA: Diagnosis not present

## 2020-08-11 DIAGNOSIS — G8929 Other chronic pain: Secondary | ICD-10-CM | POA: Diagnosis not present

## 2020-08-11 DIAGNOSIS — M79662 Pain in left lower leg: Secondary | ICD-10-CM | POA: Diagnosis not present

## 2020-08-11 DIAGNOSIS — M25561 Pain in right knee: Secondary | ICD-10-CM | POA: Diagnosis not present

## 2020-08-11 NOTE — Therapy (Signed)
[place], [place], [address] Phone: [phone]   Fax:  [phone]  Physical Therapy Treatment  Patient Details  Name: Stacie Daugherty MRN: [id number] Date of Birth: April 21, 1961 Referring Provider (PT): Dionisio David, NP   Encounter Date: 08/11/2020   PT End of Session - 08/11/20 1019    Visit Number 11    Number of Visits 17    Date for PT Re-Evaluation 09/01/20    Authorization Type UHC MCR/MCD secondary, progress note by visit 16 due to re-eval at visit 6, recheck FOTO at visit 10    PT Start Time 1016    PT Stop Time 1057    PT Time Calculation (min) 41 min    Activity Tolerance Patient tolerated treatment well    Behavior During Therapy [facility] for tasks assessed/performed           Past Medical History:  Diagnosis Date  . Allergy    mild  . Anxiety    bipolar   . Arthritis    both knees  . Bipolar 1 disorder ([place])    Hospitalized multiple times for bipolar disorder (DC - [facility], [facility], and [facility], most recently in [place])  . Depression    bipolar  . Hidradenitis suppurativa   . Hypertension     Past Surgical History:  Procedure Laterality Date  . BREAST BIOPSY Left 2012   benign  . COLOSTOMY  07/06/2015  . INDUCED ABORTION     in patient's 10s in [place].  . WISDOM TOOTH EXTRACTION      There were no vitals filed for this visit.   Subjective Assessment - 08/11/20 1018    Subjective Pt. able to localize LLE pain symptoms to proximal lateral gastroc-no pain this region with walking/otherwise just notes with car transfers. Mild benefit from past manual but prefers exercise focus. Popping symptoms in right shoulder have improved.    Pertinent History knee OA, obesity, history bipolar/depression    Currently in Pain? No/denies                             [facility] Adult PT Treatment/Exercise - 08/11/20  0001      Knee/Hip Exercises: Stretches   Gastroc Stretch Both;3 reps;30 seconds    Gastroc Stretch Limitations slant board stretch      Knee/Hip Exercises: Aerobic   Nustep L6 x 5 min UE/LE      Knee/Hip Exercises: Machines for Strengthening   Cybex Leg Press 50 lbs. bilat. LE 2x10      Knee/Hip Exercises: Standing   Heel Raises Both;2 sets;10 reps    Heel Raises Limitations heel/toe raises on Airex    Forward Lunges Right;Left;15 reps    Forward Lunges Limitations partial lunge with 1 hand support on counter    Hip Abduction AROM;Stengthening;Right;Left;2 sets;10 reps    Abduction Limitations 3 lbs.    Hip Extension AROM;Stengthening;Right;Left;2 sets;10 reps    Extension Limitations 3 lb. ankle weights    Lateral Step Up Left;Right;2 sets;10 reps;Hand Hold: 2;Step Height: 6"    Forward Step Up Right;Left;2 sets;10 reps;Hand Hold: 2;Step Height: 6"    Functional Squat 2 sets;10 reps    Functional Squat Limitations squat at counter with bilat. UE support      Shoulder Exercises: Standing   External Rotation Limitations 2x10 right side 3 lbs. with Freemotion cable   cues for elbow position  and angle ROM   Internal Rotation Limitations 2x10 right side 3 lbs. Freemotion cable    Flexion AROM;Strengthening;Right;20 reps    Shoulder Flexion Weight (lbs) 2    ABduction AROM;Strengthening;Right;20 reps    Shoulder ABduction Weight (lbs) 2    ABduction Limitations scaption    Extension Limitations 2x10 Freemotion cable x 7 lbs.    Row Limitations 2x10 Freemotion cable 7 lbs.   eccentric emphasis   Other Standing Exercises right bicep eccentric 2x10 with 7 lbs.                       PT Long Term Goals - 07/21/20 1121      PT LONG TERM GOAL #1   Title Independent with HEP    Baseline updates ongoing-added shoulder exercises 07/21/20    Time 6    Period Weeks    Status On-going    Target Date 09/01/20      PT LONG TERM GOAL #2   Title Pt. will improve FOTO score  to 46% or less impairment    Baseline 57% limited/43% function as of 07/09/20    Time 6    Period Weeks    Status On-going    Target Date 09/01/20      PT LONG TERM GOAL #3   Title Increase left knee flexion AROM to 110 deg or greater to improve ability for transfers from low seats and for stair navigation    Baseline 110 deg    Time 6    Period Weeks    Status Achieved      PT LONG TERM GOAL #4   Title Increase left knee strength to 5/5 to improve ability to navigate steps at condo entrance and to improve ability for transfers from low seats    Baseline 4+/5 flexion, 5/5 extension    Time 6    Period Weeks    Status Partially Met    Target Date 09/01/20      PT LONG TERM GOAL #5   Title Perform sit<>stand transfers from couch with left knee pain symptoms decreased 50% or greater form baseline status    Baseline still ongoing    Time 6    Period Weeks    Status On-going    Target Date 09/01/20      Additional Long Term Goals   Additional Long Term Goals Yes      PT LONG TERM GOAL #6   Title Increase right shoulder strength to 5/5 for flexion and abduction to improve ability for lifting for chores    Baseline 4+/5    Time 6    Period Weeks    Status New    Target Date 09/01/20      PT LONG TERM GOAL #7   Title Decrease shoulder/elbow pain and symptoms at least 50% from baseline status for reaching behind back for dressing/bathing and for extending elbow without pain    Time 6    Period Weeks    Status New    Target Date 09/01/20                 Plan - 08/11/20 1049    Clinical Impression Statement Still somewhat unclear etiology LLE pain symptoms with potential myofascial etiology given location/fair progress with therapy for this pain but has been able to modify car transfer technique for mild ease of symptoms. Shoulder improving with decreased crepitus and elbow pain from previous status.    Personal Factors  and Comorbidities Comorbidity 2    Comorbidities  obesity, history knee OA, depression history    Examination-Activity Limitations Lift;Squat;Bend;Stairs;Locomotion Level;Stand    Stability/Clinical Decision Making Evolving/Moderate complexity    Clinical Decision Making Moderate    Rehab Potential Good    PT Frequency 2x / week    PT Duration 6 weeks    PT Treatment/Interventions ADLs/Self Care Home Management;Cryotherapy;Electrical Stimulation;Iontophoresis 24m/ml Dexamethasone;Moist Heat;Gait training;Stair training;Functional mobility training;Therapeutic activities;Neuromuscular re-education;Balance training;Therapeutic exercise;Patient/family education;Manual techniques;Dry needling;Taping    PT Next Visit Plan Continue to check status for concordant pain left knee/gastroc region, continue exercises progression focus knee but include shoulder/bicep as needed    PT Home Exercise Plan Access code: [email or web address]: heel raises, standing hip abduction, sit<>stand from edge of bed, LAQ with Theraband, heel slides, side clams, bridge, SLR, partial squat at counter as potential alternative to sit<>stand, Bicep eccentric, shoulder Theraband ER/IR, Theraband shoulder ext and row, sleeper stretch at wall, gastroc stretch in standing    Consulted and Agree with Plan of Care Patient           Patient will benefit from skilled therapeutic intervention in order to improve the following deficits and impairments:  Difficulty walking,Hypomobility,Pain,Impaired flexibility,Decreased strength,Decreased activity tolerance,Decreased range of motion  Visit Diagnosis: Acute pain of right shoulder  Pain in left lower leg  Chronic pain of left knee  Chronic pain of right knee  Other abnormalities of gait and mobility  Muscle weakness (generalized)     Problem List Patient Active Problem List   Diagnosis Date Noted  . Proteinuria 07/10/2019  . Unilateral primary osteoarthritis, left knee 09/13/2016  . Encounter for gynecological examination without  abnormal finding 07/27/2016  . Sprain of medial collateral ligament of left knee 06/07/2016  . Chronic pain of left knee 06/02/2016  . Bipolar disorder, curr episode mixed, severe, with psychotic features [place] 08/26/2015  . History of posttraumatic stress disorder (PTSD) 08/26/2015  . Essential hypertension 08/26/2015  . History of eczema 04/08/2015  . Pedal edema 08/06/2014  . Positive ANA (antinuclear antibody) 12/19/2012  . Biological false positive RPR test 08/18/2012  . HYPERCHOLESTEROLEMIA 04/28/2009  . HIDRADENITIS SUPPURATIVA 09/06/2007  . OBESITY 04/17/2007    CBeaulah Dinning PT, DPT 08/11/20 10:58 AM  [facility] LLC[address]GEarl [place] [address]Phone: [phone]  Fax:  [phone] Name: Stacie AyeMRN: [id number] of Birth: 7Jun 17, 1962

## 2020-08-13 ENCOUNTER — Ambulatory Visit: Payer: Medicare Other | Admitting: Physical Therapy

## 2020-08-13 ENCOUNTER — Telehealth: Payer: Self-pay | Admitting: *Deleted

## 2020-08-13 ENCOUNTER — Encounter: Payer: Self-pay | Admitting: Physical Therapy

## 2020-08-13 ENCOUNTER — Other Ambulatory Visit: Payer: Self-pay

## 2020-08-13 DIAGNOSIS — R2689 Other abnormalities of gait and mobility: Secondary | ICD-10-CM

## 2020-08-13 DIAGNOSIS — M79662 Pain in left lower leg: Secondary | ICD-10-CM | POA: Diagnosis not present

## 2020-08-13 DIAGNOSIS — M25562 Pain in left knee: Secondary | ICD-10-CM | POA: Diagnosis not present

## 2020-08-13 DIAGNOSIS — M25561 Pain in right knee: Secondary | ICD-10-CM | POA: Diagnosis not present

## 2020-08-13 DIAGNOSIS — M6281 Muscle weakness (generalized): Secondary | ICD-10-CM | POA: Diagnosis not present

## 2020-08-13 DIAGNOSIS — M25511 Pain in right shoulder: Secondary | ICD-10-CM | POA: Diagnosis not present

## 2020-08-13 DIAGNOSIS — G8929 Other chronic pain: Secondary | ICD-10-CM | POA: Diagnosis not present

## 2020-08-13 NOTE — Telephone Encounter (Signed)
Patient is wanting to know if it is ok to use the OTC medication(Kerasal)for her nail fungus, will this help improve. Please advise.

## 2020-08-13 NOTE — Telephone Encounter (Signed)
Called and spoke with patient and explained Dr Leeanne Rio note concerning the use of Kerasal for nail fungus. She verbalized understanding.

## 2020-08-13 NOTE — Telephone Encounter (Signed)
She can try Brandy Hale. It will take a really long time  (1 year) to see any difference if its going to help at all. -Dr. Cannon Kettle

## 2020-08-13 NOTE — Therapy (Signed)
[place], [place], [address] Phone: [phone]   Fax:  [phone]  Physical Therapy Treatment  Patient Details  Name: Stacie Daugherty MRN: [id number] Date of Birth: Aug 10, 1960 Referring Provider (PT): Dionisio David, NP   Encounter Date: 08/13/2020   PT End of Session - 08/13/20 1051    Visit Number 12    Number of Visits 17    Date for PT Re-Evaluation 09/01/20    Authorization Type UHC MCR/MCD secondary, progress note by visit 16 due to re-eval at visit 6, recheck FOTO at visit 10    PT Start Time 1017    PT Stop Time 1058    PT Time Calculation (min) 41 min    Activity Tolerance Patient tolerated treatment well    Behavior During Therapy [facility] for tasks assessed/performed           Past Medical History:  Diagnosis Date  . Allergy    mild  . Anxiety    bipolar   . Arthritis    both knees  . Bipolar 1 disorder ([place])    Hospitalized multiple times for bipolar disorder (DC - [facility], [facility], and [facility], most recently in [place])  . Depression    bipolar  . Hidradenitis suppurativa   . Hypertension     Past Surgical History:  Procedure Laterality Date  . BREAST BIOPSY Left 2012   benign  . COLOSTOMY  07/06/2015  . INDUCED ABORTION     in patient's 55s in [place], [place].  . WISDOM TOOTH EXTRACTION      There were no vitals filed for this visit.   Subjective Assessment - 08/13/20 1019    Subjective No new complaints/concerns this AM.                             [facility] Adult PT Treatment/Exercise - 08/13/20 0001      Knee/Hip Exercises: Stretches   Gastroc Stretch Both;3 reps;30 seconds    Gastroc Stretch Limitations slant board stretch      Knee/Hip Exercises: Aerobic   Nustep L6 x 5 min UE/LE      Knee/Hip Exercises: Machines for Strengthening   Cybex Leg Press 50 lbs. bilat. LE 2x10      Knee/Hip  Exercises: Standing   Forward Step Up Right;Left;2 sets;10 reps;Hand Hold: 2;Step Height: 6"    Functional Squat Limitations touch and go squat with 10 lb. KB with touch    Rocker Board 2 minutes    Rocker Board Limitations dynamic balance x 1 min ea. lateral and fw/rev    Other Standing Knee Exercises sidestepping at counter with green band at ankles back and forth x 2 (10 feet)      Knee/Hip Exercises: Supine   Short Arc Quad Sets AROM;Strengthening;Both;2 sets;10 reps    Short Arc Quad Sets Limitations 3 lbs.    Bridges Both;2 sets;10 reps    Other Supine Knee/Hip Exercises clamshells green band 2x10      Shoulder Exercises: Standing   External Rotation Limitations 2x10 right side 3 lbs. with Freemotion cable   cues for elbow position and angle ROM   Internal Rotation Limitations 2x10 right side 3 lbs. Freemotion cable    Flexion AROM;Strengthening;Right;20 reps    Shoulder Flexion Weight (lbs) 3    ABduction AROM;Strengthening;Right;20 reps    Shoulder ABduction Weight (lbs) 2    ABduction Limitations scaption  Extension Limitations 2x10 Freemotion cable x 7 lbs.    Row Limitations 2x10 Freemotion cable 10 lbs.    Other Standing Exercises right bicep eccentric 2x10 with 7 lbs.                       PT Long Term Goals - 07/21/20 1121      PT LONG TERM GOAL #1   Title Independent with HEP    Baseline updates ongoing-added shoulder exercises 07/21/20    Time 6    Period Weeks    Status On-going    Target Date 09/01/20      PT LONG TERM GOAL #2   Title Pt. will improve FOTO score to 46% or less impairment    Baseline 57% limited/43% function as of 07/09/20    Time 6    Period Weeks    Status On-going    Target Date 09/01/20      PT LONG TERM GOAL #3   Title Increase left knee flexion AROM to 110 deg or greater to improve ability for transfers from low seats and for stair navigation    Baseline 110 deg    Time 6    Period Weeks    Status Achieved       PT LONG TERM GOAL #4   Title Increase left knee strength to 5/5 to improve ability to navigate steps at condo entrance and to improve ability for transfers from low seats    Baseline 4+/5 flexion, 5/5 extension    Time 6    Period Weeks    Status Partially Met    Target Date 09/01/20      PT LONG TERM GOAL #5   Title Perform sit<>stand transfers from couch with left knee pain symptoms decreased 50% or greater form baseline status    Baseline still ongoing    Time 6    Period Weeks    Status On-going    Target Date 09/01/20      Additional Long Term Goals   Additional Long Term Goals Yes      PT LONG TERM GOAL #6   Title Increase right shoulder strength to 5/5 for flexion and abduction to improve ability for lifting for chores    Baseline 4+/5    Time 6    Period Weeks    Status New    Target Date 09/01/20      PT LONG TERM GOAL #7   Title Decrease shoulder/elbow pain and symptoms at least 50% from baseline status for reaching behind back for dressing/bathing and for extending elbow without pain    Time 6    Period Weeks    Status New    Target Date 09/01/20                 Plan - 08/13/20 1059    Clinical Impression Statement Continued previous tx. emphasis for shoulder/bicep and LE strengthening. No corcordant pain with exercises so still unclear symptom etiology with upper lateral calf pain with car transfers but showing improvement with strength gains/exercise progression from previous status.    Personal Factors and Comorbidities Comorbidity 2    Comorbidities obesity, history knee OA, depression history    Examination-Activity Limitations Lift;Squat;Bend;Stairs;Locomotion Level;Stand    Examination-Participation Restrictions Cleaning;Laundry;Shop;Community Activity    Stability/Clinical Decision Making Evolving/Moderate complexity    Clinical Decision Making Moderate    Rehab Potential Good    PT Frequency 2x / week    PT Duration  6 weeks    PT  Treatment/Interventions ADLs/Self Care Home Management;Cryotherapy;Electrical Stimulation;Iontophoresis 8m/ml Dexamethasone;Moist Heat;Gait training;Stair training;Functional mobility training;Therapeutic activities;Neuromuscular re-education;Balance training;Therapeutic exercise;Patient/family education;Manual techniques;Dry needling;Taping    PT Next Visit Plan Continue to check status for concordant pain left knee/gastroc region, continue exercises progression focus knee but include shoulder/bicep as needed    PT Home Exercise Plan Access code: [email or web address]: heel raises, standing hip abduction, sit<>stand from edge of bed, LAQ with Theraband, heel slides, side clams, bridge, SLR, partial squat at counter as potential alternative to sit<>stand, Bicep eccentric, shoulder Theraband ER/IR, Theraband shoulder ext and row, sleeper stretch at wall, gastroc stretch in standing    Consulted and Agree with Plan of Care Patient           Patient will benefit from skilled therapeutic intervention in order to improve the following deficits and impairments:  Difficulty walking,Hypomobility,Pain,Impaired flexibility,Decreased strength,Decreased activity tolerance,Decreased range of motion  Visit Diagnosis: Acute pain of right shoulder  Pain in left lower leg  Chronic pain of left knee  Chronic pain of right knee  Other abnormalities of gait and mobility  Muscle weakness (generalized)     Problem List Patient Active Problem List   Diagnosis Date Noted  . Proteinuria 07/10/2019  . Unilateral primary osteoarthritis, left knee 09/13/2016  . Encounter for gynecological examination without abnormal finding 07/27/2016  . Sprain of medial collateral ligament of left knee 06/07/2016  . Chronic pain of left knee 06/02/2016  . Bipolar disorder, curr episode mixed, severe, with psychotic features ([place] 08/26/2015  . History of posttraumatic stress disorder (PTSD) 08/26/2015  . Essential hypertension  08/26/2015  . History of eczema 04/08/2015  . Pedal edema 08/06/2014  . Positive ANA (antinuclear antibody) 12/19/2012  . Biological false positive RPR test 08/18/2012  . HYPERCHOLESTEROLEMIA 04/28/2009  . HIDRADENITIS SUPPURATIVA 09/06/2007  . OBESITY 04/17/2007    CBeaulah Dinning PT, DPT 08/13/20 11:15 AM  C[facility] Inc[address]GFairfax [place] [address]Phone: [phone]  Fax:  [phone] Name: Stacie EckartMRN: [id number] of Birth: [birth year]

## 2020-08-17 ENCOUNTER — Encounter: Payer: Self-pay | Admitting: Physical Therapy

## 2020-08-17 ENCOUNTER — Other Ambulatory Visit: Payer: Self-pay

## 2020-08-17 ENCOUNTER — Ambulatory Visit: Payer: Medicare Other | Admitting: Physical Therapy

## 2020-08-17 DIAGNOSIS — R2689 Other abnormalities of gait and mobility: Secondary | ICD-10-CM | POA: Diagnosis not present

## 2020-08-17 DIAGNOSIS — M6281 Muscle weakness (generalized): Secondary | ICD-10-CM | POA: Diagnosis not present

## 2020-08-17 DIAGNOSIS — M25511 Pain in right shoulder: Secondary | ICD-10-CM

## 2020-08-17 DIAGNOSIS — G8929 Other chronic pain: Secondary | ICD-10-CM | POA: Diagnosis not present

## 2020-08-17 DIAGNOSIS — M25561 Pain in right knee: Secondary | ICD-10-CM | POA: Diagnosis not present

## 2020-08-17 DIAGNOSIS — M79662 Pain in left lower leg: Secondary | ICD-10-CM

## 2020-08-17 DIAGNOSIS — M25562 Pain in left knee: Secondary | ICD-10-CM

## 2020-08-17 NOTE — Therapy (Signed)
[place], [place], [address] Phone: [phone]   Fax:  [phone]  Physical Therapy Treatment  Patient Details  Name: Stacie Daugherty MRN: [id number] Date of Birth: May 23, 1961 Referring Provider (PT): Dionisio David, NP   Encounter Date: 08/17/2020   PT End of Session - 08/17/20 1022    Visit Number 13    Number of Visits 17    Date for PT Re-Evaluation 09/01/20    Authorization Type UHC MCR/MCD secondary, progress note by visit 16 due to re-eval at visit 6, recheck FOTO at discharge    PT Start Time 1015    PT Stop Time 1100    PT Time Calculation (min) 45 min    Activity Tolerance Patient tolerated treatment well    Behavior During Therapy [facility] for tasks assessed/performed           Past Medical History:  Diagnosis Date  . Allergy    mild  . Anxiety    bipolar   . Arthritis    both knees  . Bipolar 1 disorder ([place])    Hospitalized multiple times for bipolar disorder (DC - [facility], [facility], and [facility], most recently in [place])  . Depression    bipolar  . Hidradenitis suppurativa   . Hypertension     Past Surgical History:  Procedure Laterality Date  . BREAST BIOPSY Left 2012   benign  . COLOSTOMY  07/06/2015  . INDUCED ABORTION     in patient's 30s in [place], [place].  . WISDOM TOOTH EXTRACTION      There were no vitals filed for this visit.   Subjective Assessment - 08/17/20 1020    Subjective Pt reports pain when she gets up in the morning due to stiffness in her feet , knees calves. It takes awhile to loosen up after walking in the mornings.    Currently in Pain? No/denies              [facility] PT Assessment - 08/17/20 0001      Observation/Other Assessments   Focus on Therapeutic Outcomes (FOTO)  43%                         [facility] Adult PT Treatment/Exercise - 08/17/20 0001      Knee/Hip Exercises:  Aerobic   Nustep L6 x 5 min UE/LE      Knee/Hip Exercises: Standing   Heel Raises Both;2 sets;10 reps    Hip Extension AROM;Stengthening;Right;Left;2 sets;10 reps    Extension Limitations green band    Forward Step Up Right;Left;2 sets;10 reps;Step Height: 6";Hand Hold: 1    Functional Squat Limitations touch and go squat with 10 lb. KB with touch   10 x2   Other Standing Knee Exercises sidestepping at counter with green band at ankles back and forth x 2 (10 feet)      Shoulder Exercises: Standing   External Rotation Limitations 2x10 right side 3 lbs. with Freemotion cable   cues for elbow position and angle ROM   Internal Rotation Limitations 2x10 right side 7 lbs. Freemotion cable    Extension Limitations 2x10 Freemotion cable x 7 lbs.    Other Standing Exercises right bicep eccentric 2x10 with 7 lbs.                  PT Education - 08/17/20 1036    Education Details FOTO score and predictions  Person(s) Educated Patient    Methods Explanation    Comprehension Verbalized understanding               PT Long Term Goals - 07/21/20 1121      PT LONG TERM GOAL #1   Title Independent with HEP    Baseline updates ongoing-added shoulder exercises 07/21/20    Time 6    Period Weeks    Status On-going    Target Date 09/01/20      PT LONG TERM GOAL #2   Title Pt. will improve FOTO score to 46% or less impairment    Baseline 57% limited/43% function as of 07/09/20    Time 6    Period Weeks    Status On-going    Target Date 09/01/20      PT LONG TERM GOAL #3   Title Increase left knee flexion AROM to 110 deg or greater to improve ability for transfers from low seats and for stair navigation    Baseline 110 deg    Time 6    Period Weeks    Status Achieved      PT LONG TERM GOAL #4   Title Increase left knee strength to 5/5 to improve ability to navigate steps at condo entrance and to improve ability for transfers from low seats    Baseline 4+/5 flexion, 5/5  extension    Time 6    Period Weeks    Status Partially Met    Target Date 09/01/20      PT LONG TERM GOAL #5   Title Perform sit<>stand transfers from couch with left knee pain symptoms decreased 50% or greater form baseline status    Baseline still ongoing    Time 6    Period Weeks    Status On-going    Target Date 09/01/20      Additional Long Term Goals   Additional Long Term Goals Yes      PT LONG TERM GOAL #6   Title Increase right shoulder strength to 5/5 for flexion and abduction to improve ability for lifting for chores    Baseline 4+/5    Time 6    Period Weeks    Status New    Target Date 09/01/20      PT LONG TERM GOAL #7   Title Decrease shoulder/elbow pain and symptoms at least 50% from baseline status for reaching behind back for dressing/bathing and for extending elbow without pain    Time 6    Period Weeks    Status New    Target Date 09/01/20                 Plan - 08/17/20 1125    Clinical Impression Statement Pt arrives reporting no pain but with continued stifness and pain in the morning that resolves after she walks around. Her FOTO score is unchanged at 43 % function. She reports she might consider going to a gym after she completes PT. Also discussed continuing her final HEP vs gym or both. She tolerated sesson well without c/o pain,    PT Next Visit Plan work toward finalizing her HEP for discharge. Continue to check status for concordant pain left knee/gastroc region, continue exercises progression focus knee but include shoulder/bicep as needed    PT Home Exercise Plan Access code: [email or web address]: heel raises, standing hip abduction, sit<>stand from edge of bed, LAQ with Theraband, heel slides, side clams, bridge, SLR, partial squat at counter as potential alternative  to sit<>stand, Bicep eccentric, shoulder Theraband ER/IR, Theraband shoulder ext and row, sleeper stretch at wall, gastroc stretch in standing           Patient will benefit from  skilled therapeutic intervention in order to improve the following deficits and impairments:  Difficulty walking,Hypomobility,Pain,Impaired flexibility,Decreased strength,Decreased activity tolerance,Decreased range of motion  Visit Diagnosis: Acute pain of right shoulder  Pain in left lower leg  Chronic pain of left knee  Chronic pain of right knee  Other abnormalities of gait and mobility  Muscle weakness (generalized)     Problem List Patient Active Problem List   Diagnosis Date Noted  . Proteinuria 07/10/2019  . Unilateral primary osteoarthritis, left knee 09/13/2016  . Encounter for gynecological examination without abnormal finding 07/27/2016  . Sprain of medial collateral ligament of left knee 06/07/2016  . Chronic pain of left knee 06/02/2016  . Bipolar disorder, curr episode mixed, severe, with psychotic features ([place]) 08/26/2015  . History of posttraumatic stress disorder (PTSD) 08/26/2015  . Essential hypertension 08/26/2015  . History of eczema 04/08/2015  . Pedal edema 08/06/2014  . Positive ANA (antinuclear antibody) 12/19/2012  . Biological false positive RPR test 08/18/2012  . HYPERCHOLESTEROLEMIA 04/28/2009  . HIDRADENITIS SUPPURATIVA 09/06/2007  . OBESITY 04/17/2007    Dorene Ar, PTA 08/17/2020, 11:30 AM  [facility] [address] [place], [place], [address] Phone: [phone]   Fax:  [phone]  Name: Stacie Daugherty MRN: [id number] Date of Birth: Mar 16, 1961

## 2020-08-20 ENCOUNTER — Ambulatory Visit: Payer: Medicare Other | Admitting: Physical Therapy

## 2020-08-20 ENCOUNTER — Encounter: Payer: Self-pay | Admitting: Physical Therapy

## 2020-08-20 ENCOUNTER — Other Ambulatory Visit: Payer: Self-pay

## 2020-08-20 DIAGNOSIS — M25511 Pain in right shoulder: Secondary | ICD-10-CM

## 2020-08-20 DIAGNOSIS — G8929 Other chronic pain: Secondary | ICD-10-CM

## 2020-08-20 DIAGNOSIS — R2689 Other abnormalities of gait and mobility: Secondary | ICD-10-CM | POA: Diagnosis not present

## 2020-08-20 DIAGNOSIS — M25562 Pain in left knee: Secondary | ICD-10-CM | POA: Diagnosis not present

## 2020-08-20 DIAGNOSIS — M6281 Muscle weakness (generalized): Secondary | ICD-10-CM | POA: Diagnosis not present

## 2020-08-20 DIAGNOSIS — M25561 Pain in right knee: Secondary | ICD-10-CM | POA: Diagnosis not present

## 2020-08-20 DIAGNOSIS — M79662 Pain in left lower leg: Secondary | ICD-10-CM

## 2020-08-20 NOTE — Therapy (Signed)
[place], [place], [address] Phone: [phone]   Fax:  [phone]  Physical Therapy Treatment  Patient Details  Name: Stacie Daugherty MRN: [id number] Date of Birth: August 28, 1960 Referring Provider (PT): Dionisio David, NP   Encounter Date: 08/20/2020   PT End of Session - 08/20/20 1103    Visit Number 14    Number of Visits 17    Date for PT Re-Evaluation 09/01/20    Authorization Type UHC MCR/MCD secondary, progress note by visit 16 due to re-eval at visit 6, recheck FOTO at discharge    PT Start Time 1018    PT Stop Time 1058    PT Time Calculation (min) 40 min    Activity Tolerance Patient tolerated treatment well    Behavior During Therapy [facility] for tasks assessed/performed           Past Medical History:  Diagnosis Date  . Allergy    mild  . Anxiety    bipolar   . Arthritis    both knees  . Bipolar 1 disorder ([place])    Hospitalized multiple times for bipolar disorder (DC - [facility], [facility], and [facility], most recently in [place])  . Depression    bipolar  . Hidradenitis suppurativa   . Hypertension     Past Surgical History:  Procedure Laterality Date  . BREAST BIOPSY Left 2012   benign  . COLOSTOMY  07/06/2015  . INDUCED ABORTION     in patient's 30s in [place], [place].  . WISDOM TOOTH EXTRACTION      There were no vitals filed for this visit.   Subjective Assessment - 08/20/20 1020    Subjective No new complaints or concerns this AM.    Pertinent History knee OA, obesity, history bipolar/depression    Currently in Pain? No/denies                             [facility] Adult PT Treatment/Exercise - 08/20/20 0001      Knee/Hip Exercises: Stretches   Other Knee/Hip Stretches slant board stretch 3x30 sec      Knee/Hip Exercises: Aerobic   Nustep L6 x 5 min UE/LE      Knee/Hip Exercises: Machines for  Strengthening   Cybex Leg Press 50 lbs. bilat. LE 2x10      Knee/Hip Exercises: Standing   Forward Lunges Right;Left;2 sets;10 reps    Forward Lunges Limitations front partial lunge with single UE support on counter    Hip Extension AROM;Stengthening;Right;Left;2 sets;10 reps    Extension Limitations green band    Lateral Step Up Right;Left;2 sets;10 reps;Hand Hold: 2;Step Height: 6"    Functional Squat Limitations partial front squat with 10 lb. KB 2x10 with chair behind pt. for safety and cueing    Rocker Board 2 minutes    Rocker Board Limitations 1 min ea. dynamic balance lateral and fw/rev    Other Standing Knee Exercises sidestepping at counter with green band at ankles back and forth x 2 (10 feet)      Shoulder Exercises: Standing   External Rotation Limitations 2x10 right side 3 lbs. with Freemotion cable   cues for elbow position and angle ROM   Internal Rotation Limitations 2x10 right side 7 lbs. Freemotion cable    Other Standing Exercises right bicep eccentric 2x10 with 7 lbs.      Shoulder Exercises: ROM/Strengthening   Cybex  Press Limitations 2x10 10 lbs.    Cybex Row Limitations 2x10 15 lbs. vertical grips                  PT Education - 08/20/20 1102    Education Details gym exercises and POC to transition to independent exercises/return to gym after finishing remaining therapy visits    Person(s) Educated Patient    Methods Explanation;Demonstration;Verbal cues    Comprehension Verbalized understanding;Returned demonstration;Verbal cues required               PT Long Term Goals - 07/21/20 1121      PT LONG TERM GOAL #1   Title Independent with HEP    Baseline updates ongoing-added shoulder exercises 07/21/20    Time 6    Period Weeks    Status On-going    Target Date 09/01/20      PT LONG TERM GOAL #2   Title Pt. will improve FOTO score to 46% or less impairment    Baseline 57% limited/43% function as of 07/09/20    Time 6    Period Weeks     Status On-going    Target Date 09/01/20      PT LONG TERM GOAL #3   Title Increase left knee flexion AROM to 110 deg or greater to improve ability for transfers from low seats and for stair navigation    Baseline 110 deg    Time 6    Period Weeks    Status Achieved      PT LONG TERM GOAL #4   Title Increase left knee strength to 5/5 to improve ability to navigate steps at condo entrance and to improve ability for transfers from low seats    Baseline 4+/5 flexion, 5/5 extension    Time 6    Period Weeks    Status Partially Met    Target Date 09/01/20      PT LONG TERM GOAL #5   Title Perform sit<>stand transfers from couch with left knee pain symptoms decreased 50% or greater form baseline status    Baseline still ongoing    Time 6    Period Weeks    Status On-going    Target Date 09/01/20      Additional Long Term Goals   Additional Long Term Goals Yes      PT LONG TERM GOAL #6   Title Increase right shoulder strength to 5/5 for flexion and abduction to improve ability for lifting for chores    Baseline 4+/5    Time 6    Period Weeks    Status New    Target Date 09/01/20      PT LONG TERM GOAL #7   Title Decrease shoulder/elbow pain and symptoms at least 50% from baseline status for reaching behind back for dressing/bathing and for extending elbow without pain    Time 6    Period Weeks    Status New    Target Date 09/01/20                 Plan - 08/20/20 1103    Clinical Impression Statement Added more machine-based strengthening for simulation gym exercises with plan to transition to gym after remaining therapy visits and also instructed/review basic parameters for gym workouts. Progressing in terms of strength gains and has made improvements in car transfer ability with technique modification otherwise functional limitations and status per FOTO recheck last session ongoing.    Personal Factors and Comorbidities Comorbidity 2  Comorbidities obesity, history  knee OA, depression history    Examination-Activity Limitations Lift;Squat;Bend;Stairs;Locomotion Level;Stand    Examination-Participation Restrictions Cleaning;Laundry;Shop;Community Activity    Stability/Clinical Decision Making Evolving/Moderate complexity    Clinical Decision Making Moderate    Rehab Potential Good    PT Frequency 2x / week    PT Duration 6 weeks    PT Treatment/Interventions ADLs/Self Care Home Management;Cryotherapy;Electrical Stimulation;Iontophoresis 83m/ml Dexamethasone;Moist Heat;Gait training;Stair training;Functional mobility training;Therapeutic activities;Neuromuscular re-education;Balance training;Therapeutic exercise;Patient/family education;Manual techniques;Dry needling;Taping    PT Next Visit Plan work toward finalizing her HEP for discharge. Continue to check status for concordant pain left knee/gastroc region, continue exercises progression focus knee but include shoulder/bicep as needed    PT Home Exercise Plan Access code: [email or web address]: heel raises, standing hip abduction, sit<>stand from edge of bed, LAQ with Theraband, heel slides, side clams, bridge, SLR, partial squat at counter as potential alternative to sit<>stand, Bicep eccentric, shoulder Theraband ER/IR, Theraband shoulder ext and row, sleeper stretch at wall, gastroc stretch in standing    Consulted and Agree with Plan of Care Patient           Patient will benefit from skilled therapeutic intervention in order to improve the following deficits and impairments:  Difficulty walking,Hypomobility,Pain,Impaired flexibility,Decreased strength,Decreased activity tolerance,Decreased range of motion  Visit Diagnosis: Acute pain of right shoulder  Pain in left lower leg  Chronic pain of left knee  Chronic pain of right knee  Other abnormalities of gait and mobility  Muscle weakness (generalized)     Problem List Patient Active Problem List   Diagnosis Date Noted  . Proteinuria 07/10/2019  .  Unilateral primary osteoarthritis, left knee 09/13/2016  . Encounter for gynecological examination without abnormal finding 07/27/2016  . Sprain of medial collateral ligament of left knee 06/07/2016  . Chronic pain of left knee 06/02/2016  . Bipolar disorder, curr episode mixed, severe, with psychotic features ([place] 08/26/2015  . History of posttraumatic stress disorder (PTSD) 08/26/2015  . Essential hypertension 08/26/2015  . History of eczema 04/08/2015  . Pedal edema 08/06/2014  . Positive ANA (antinuclear antibody) 12/19/2012  . Biological false positive RPR test 08/18/2012  . HYPERCHOLESTEROLEMIA 04/28/2009  . HIDRADENITIS SUPPURATIVA 09/06/2007  . OBESITY 04/17/2007    CBeaulah Dinning PT, DPT 08/20/20 11:07 AM  [facility]175[address]GB[address] [place] [address]Phone: [phone]  Fax:  [phone] Name: TBelicia DifattaMRN: [id number] of Birth: [birth year]

## 2020-08-22 ENCOUNTER — Other Ambulatory Visit: Payer: Self-pay | Admitting: Nurse Practitioner

## 2020-08-22 DIAGNOSIS — E7849 Other hyperlipidemia: Secondary | ICD-10-CM

## 2020-08-25 ENCOUNTER — Other Ambulatory Visit: Payer: Self-pay

## 2020-08-25 ENCOUNTER — Encounter: Payer: Self-pay | Admitting: Physical Therapy

## 2020-08-25 ENCOUNTER — Ambulatory Visit: Payer: Medicare Other | Admitting: Physical Therapy

## 2020-08-25 DIAGNOSIS — G8929 Other chronic pain: Secondary | ICD-10-CM | POA: Diagnosis not present

## 2020-08-25 DIAGNOSIS — R2689 Other abnormalities of gait and mobility: Secondary | ICD-10-CM

## 2020-08-25 DIAGNOSIS — M25511 Pain in right shoulder: Secondary | ICD-10-CM

## 2020-08-25 DIAGNOSIS — M25561 Pain in right knee: Secondary | ICD-10-CM

## 2020-08-25 DIAGNOSIS — M79662 Pain in left lower leg: Secondary | ICD-10-CM | POA: Diagnosis not present

## 2020-08-25 DIAGNOSIS — M6281 Muscle weakness (generalized): Secondary | ICD-10-CM

## 2020-08-25 DIAGNOSIS — M25562 Pain in left knee: Secondary | ICD-10-CM | POA: Diagnosis not present

## 2020-08-25 NOTE — Therapy (Signed)
[place], [place], [address] Phone: [phone]   Fax:  [phone]  Physical Therapy Treatment  Patient Details  Name: Stacie Daugherty MRN: [id number] Date of Birth: 1960/07/11 Referring Provider (PT): Dionisio David, NP   Encounter Date: 08/25/2020   PT End of Session - 08/25/20 1029    Visit Number 15    Number of Visits 17    Date for PT Re-Evaluation 09/01/20    Authorization Type UHC MCR/MCD secondary, progress note by visit 16 due to re-eval at visit 6, recheck FOTO at discharge    PT Start Time 1016    PT Stop Time 1055    PT Time Calculation (min) 39 min    Activity Tolerance Patient tolerated treatment well    Behavior During Therapy [facility] for tasks assessed/performed           Past Medical History:  Diagnosis Date  . Allergy    mild  . Anxiety    bipolar   . Arthritis    both knees  . Bipolar 1 disorder ([place])    Hospitalized multiple times for bipolar disorder (DC - [facility], [facility], and [facility], most recently in [place])  . Depression    bipolar  . Hidradenitis suppurativa   . Hypertension     Past Surgical History:  Procedure Laterality Date  . BREAST BIOPSY Left 2012   benign  . COLOSTOMY  07/06/2015  . INDUCED ABORTION     in patient's 87s in [place], [place].  . WISDOM TOOTH EXTRACTION      There were no vitals filed for this visit.   Subjective Assessment - 08/25/20 1032    Subjective No pain pre-tx. but still having some pain with car tranfers and initial steps in AM or after sitting with knees.    Pertinent History knee OA, obesity, history bipolar/depression    Currently in Pain? No/denies                             [facility] Adult PT Treatment/Exercise - 08/25/20 0001      Knee/Hip Exercises: Aerobic   Nustep L6 x 6 min UE/LE      Knee/Hip Exercises: Machines for Strengthening   Cybex Leg  Press 55  lbs. bilat. LE 2x10      Knee/Hip Exercises: Standing   Hip Abduction AROM;Stengthening;Right;Left;2 sets;10 reps    Abduction Limitations green band proximal to knees    Hip Extension AROM;Stengthening;Right;Left;2 sets;10 reps    Extension Limitations green band    Lateral Step Up Right;Left;2 sets;10 reps;Hand Hold: 2;Step Height: 6"    Functional Squat Limitations partial front squat with 10 lb. KB 2x10 with chair behind pt. for safety and cueing    Rocker Board 2 minutes    Rocker Board Limitations 1 min ea. dynamic balance lateral and fw/rev      Shoulder Exercises: Standing   External Rotation Limitations 2x10 right side 3 lbs. with Freemotion cable   cues for elbow position and angle ROM   Internal Rotation Limitations 2x10 right side 7 lbs. Freemotion cable    Flexion AROM;Strengthening;Right;20 reps    Shoulder Flexion Weight (lbs) 3    Flexion Limitations full can to 90 deg    ABduction AROM;Strengthening;Right;20 reps    Shoulder ABduction Weight (lbs) 2    ABduction Limitations full can to 90 deg    Other Standing Exercises  right bicep eccentric 2x10 with 7 lbs.      Shoulder Exercises: ROM/Strengthening   Lat Pull Limitations 2x10 20 lbs. underhand grip at shoulder width    Cybex Press Limitations 2x10 15 lbs. vertical grips    Cybex Row Limitations 2x10 25 lbs. vertical grips                       PT Long Term Goals - 07/21/20 1121      PT LONG TERM GOAL #1   Title Independent with HEP    Baseline updates ongoing-added shoulder exercises 07/21/20    Time 6    Period Weeks    Status On-going    Target Date 09/01/20      PT LONG TERM GOAL #2   Title Pt. will improve FOTO score to 46% or less impairment    Baseline 57% limited/43% function as of 07/09/20    Time 6    Period Weeks    Status On-going    Target Date 09/01/20      PT LONG TERM GOAL #3   Title Increase left knee flexion AROM to 110 deg or greater to improve ability for  transfers from low seats and for stair navigation    Baseline 110 deg    Time 6    Period Weeks    Status Achieved      PT LONG TERM GOAL #4   Title Increase left knee strength to 5/5 to improve ability to navigate steps at condo entrance and to improve ability for transfers from low seats    Baseline 4+/5 flexion, 5/5 extension    Time 6    Period Weeks    Status Partially Met    Target Date 09/01/20      PT LONG TERM GOAL #5   Title Perform sit<>stand transfers from couch with left knee pain symptoms decreased 50% or greater form baseline status    Baseline still ongoing    Time 6    Period Weeks    Status On-going    Target Date 09/01/20      Additional Long Term Goals   Additional Long Term Goals Yes      PT LONG TERM GOAL #6   Title Increase right shoulder strength to 5/5 for flexion and abduction to improve ability for lifting for chores    Baseline 4+/5    Time 6    Period Weeks    Status New    Target Date 09/01/20      PT LONG TERM GOAL #7   Title Decrease shoulder/elbow pain and symptoms at least 50% from baseline status for reaching behind back for dressing/bathing and for extending elbow without pain    Time 6    Period Weeks    Status New    Target Date 09/01/20                 Plan - 08/25/20 1040    Clinical Impression Statement Continued previous tx. emphasis on knee/hip and shoulder strengthening with inclusion of machines to simulate gym exercises with plan to transition to gym for HEP after d/c from formal therapy. Session well-tolerated without c/o increased pain. Pt. needs to cancel next visit so tentatively plan d/c to HEP after last scheduled visit next week.    Personal Factors and Comorbidities Comorbidity 2    Comorbidities obesity, history knee OA, depression history    Examination-Activity Limitations Lift;Squat;Bend;Stairs;Locomotion Level;Stand    Examination-Participation Restrictions  Cleaning;Laundry;Shop;Community Activity     Stability/Clinical Decision Making Evolving/Moderate complexity    Clinical Decision Making Moderate    Rehab Potential Good    PT Frequency 2x / week    PT Duration 6 weeks    PT Treatment/Interventions ADLs/Self Care Home Management;Cryotherapy;Electrical Stimulation;Iontophoresis 47m/ml Dexamethasone;Moist Heat;Gait training;Stair training;Functional mobility training;Therapeutic activities;Neuromuscular re-education;Balance training;Therapeutic exercise;Patient/family education;Manual techniques;Dry needling;Taping    PT Next Visit Plan tentative d/c to HEP next session, update HEP as needed    PT Home Exercise Plan Access code: [email or web address]: heel raises, standing hip abduction, sit<>stand from edge of bed, LAQ with Theraband, heel slides, side clams, bridge, SLR, partial squat at counter as potential alternative to sit<>stand, Bicep eccentric, shoulder Theraband ER/IR, Theraband shoulder ext and row, sleeper stretch at wall, gastroc stretch in standing    Consulted and Agree with Plan of Care Patient           Patient will benefit from skilled therapeutic intervention in order to improve the following deficits and impairments:  Difficulty walking,Hypomobility,Pain,Impaired flexibility,Decreased strength,Decreased activity tolerance,Decreased range of motion  Visit Diagnosis: Acute pain of right shoulder  Pain in left lower leg  Chronic pain of left knee  Chronic pain of right knee  Other abnormalities of gait and mobility  Muscle weakness (generalized)     Problem List Patient Active Problem List   Diagnosis Date Noted  . Proteinuria 07/10/2019  . Unilateral primary osteoarthritis, left knee 09/13/2016  . Encounter for gynecological examination without abnormal finding 07/27/2016  . Sprain of medial collateral ligament of left knee 06/07/2016  . Chronic pain of left knee 06/02/2016  . Bipolar disorder, curr episode mixed, severe, with psychotic features ([place] 08/26/2015  .  History of posttraumatic stress disorder (PTSD) 08/26/2015  . Essential hypertension 08/26/2015  . History of eczema 04/08/2015  . Pedal edema 08/06/2014  . Positive ANA (antinuclear antibody) 12/19/2012  . Biological false positive RPR test 08/18/2012  . HYPERCHOLESTEROLEMIA 04/28/2009  . HIDRADENITIS SUPPURATIVA 09/06/2007  . OBESITY 04/17/2007    CBeaulah Dinning PT, DPT 08/25/20 10:56 AM  [facility]167 [address]GPaden City [place] [address]Phone: [phone]  Fax:  [phone] Name: Stacie FussnerMRN: [id number] of Birth: [birth year]

## 2020-08-27 ENCOUNTER — Ambulatory Visit: Payer: Medicare Other | Admitting: Physical Therapy

## 2020-09-01 ENCOUNTER — Encounter: Payer: Medicare Other | Admitting: Physical Therapy

## 2020-09-02 ENCOUNTER — Ambulatory Visit: Payer: Medicare Other | Admitting: Physical Therapy

## 2020-09-07 ENCOUNTER — Ambulatory Visit: Payer: Medicare Other | Admitting: Physical Therapy

## 2020-09-08 ENCOUNTER — Other Ambulatory Visit: Payer: Self-pay

## 2020-09-08 ENCOUNTER — Encounter: Payer: Self-pay | Admitting: Physical Therapy

## 2020-09-08 ENCOUNTER — Ambulatory Visit: Payer: Medicare Other | Attending: Nurse Practitioner | Admitting: Physical Therapy

## 2020-09-08 DIAGNOSIS — M25561 Pain in right knee: Secondary | ICD-10-CM | POA: Insufficient documentation

## 2020-09-08 DIAGNOSIS — M6281 Muscle weakness (generalized): Secondary | ICD-10-CM

## 2020-09-08 DIAGNOSIS — M25511 Pain in right shoulder: Secondary | ICD-10-CM | POA: Diagnosis not present

## 2020-09-08 DIAGNOSIS — R2689 Other abnormalities of gait and mobility: Secondary | ICD-10-CM | POA: Diagnosis not present

## 2020-09-08 DIAGNOSIS — M25562 Pain in left knee: Secondary | ICD-10-CM | POA: Insufficient documentation

## 2020-09-08 DIAGNOSIS — M79662 Pain in left lower leg: Secondary | ICD-10-CM | POA: Insufficient documentation

## 2020-09-08 DIAGNOSIS — G8929 Other chronic pain: Secondary | ICD-10-CM | POA: Insufficient documentation

## 2020-09-09 NOTE — Therapy (Signed)
[place], [place], [address] Phone: [phone]   Fax:  [phone]  Physical Therapy Treatment/Discharge  Patient Details  Name: Stacie Daugherty MRN: [id number] Date of Birth: 01-Sep-1960 Referring Provider (PT): Dionisio David, NP   Encounter Date: 09/08/2020   PT End of Session - 09/08/20 1508    Visit Number 16    Number of Visits 16    Date for PT Re-Evaluation 09/08/20    Authorization Type UHC MCR/MCD secondary    PT Start Time 1501    PT Stop Time 1572    PT Time Calculation (min) 43 min    Activity Tolerance Patient tolerated treatment well    Behavior During Therapy [facility] for tasks assessed/performed           Past Medical History:  Diagnosis Date  . Allergy    mild  . Anxiety    bipolar   . Arthritis    both knees  . Bipolar 1 disorder ([place])    Hospitalized multiple times for bipolar disorder (DC - [facility], [facility], and [facility], most recently in [place])  . Depression    bipolar  . Hidradenitis suppurativa   . Hypertension     Past Surgical History:  Procedure Laterality Date  . BREAST BIOPSY Left 2012   benign  . COLOSTOMY  07/06/2015  . INDUCED ABORTION     in patient's 70s in [place], [place].  . WISDOM TOOTH EXTRACTION      There were no vitals filed for this visit.   Subjective Assessment - 09/09/20 1442    Subjective Pt. returns, last seen for therapy 2 weeks ago with last session cancelled due to appointment conflict. She reports plans to join [facility] for continued exercise progression from PT. Still having knee pain worse with car transfers and sit>stand after prolonged sitting with mild improvement. For shoulder region pain has improved and not having and further symptoms of shoulder popping with reaching motions. She does note still having some intermittent distal bicep region pain with straightening elbow with mild  improvement also with therapy to date.    Pertinent History knee OA, obesity, history bipolar/depression    Limitations Standing;Lifting;Walking;House hold activities    Diagnostic tests X-rays for knees 2018    Patient Stated Goals Get leg/knee better and improve ability for transfers and navigating stairs to home entrance    Currently in Pain? No/denies              [facility] PT Assessment - 09/09/20 0001      Observation/Other Assessments   Focus on Therapeutic Outcomes (FOTO)  45% function      AROM   Right Knee Extension 3    Right Knee Flexion 103    Left Knee Extension 2    Left Knee Flexion 105      Strength   Right Shoulder Flexion 5/5    Right Shoulder ABduction 5/5    Right Shoulder Internal Rotation 5/5    Right Shoulder External Rotation 5/5    Left Shoulder Flexion 4+/5    Left Shoulder ABduction 4+/5    Left Shoulder Internal Rotation 5/5    Left Shoulder External Rotation 5/5    Right Elbow Flexion 5/5    Right Elbow Extension 5/5    Left Elbow Flexion 5/5    Left Elbow Extension 5/5    Right Knee Flexion 5/5    Right Knee Extension 5/5  Left Knee Flexion 5/5    Left Knee Extension 5/5                         [facility] Adult PT Treatment/Exercise - 09/09/20 0001      Knee/Hip Exercises: Aerobic   Nustep L6 x 5 min UE/LE      Knee/Hip Exercises: Standing   Lateral Step Up Left;Right;1 set;15 reps;Hand Hold: 2;Step Height: 6"    Forward Step Up Right;Left;1 set;10 reps;Hand Hold: 2;Step Height: 6"      Knee/Hip Exercises: Seated   Sit to Sand 2 sets;10 reps;without UE support   holding 10 lb. KB     Shoulder Exercises: Standing   External Rotation Limitations 2x10 both sides 3 lbs. with Freemotion cable   cues for elbow position and angle ROM   Internal Rotation Limitations 2x10 both sides 7 lbs. Freemotion cable    Extension Limitations Freemotion cable extension 7 lbs. 2x10    Row Limitations Freemotion cable row 7 lbs. 2x10    Other  Standing Exercises right bicep eccentric 2x10 with 7 lbs.                  PT Education - 09/09/20 1447    Education Details HEP, POC    Person(s) Educated Patient    Methods Explanation;Demonstration;Verbal cues    Comprehension Verbalized understanding;Returned demonstration               PT Long Term Goals - 09/09/20 1448      PT LONG TERM GOAL #1   Title Independent with HEP    Baseline met    Time 6    Period Weeks    Status Achieved      PT LONG TERM GOAL #2   Title Pt. will improve FOTO score to 46% or less impairment    Baseline 55% limietd/45% function    Time 6    Period Weeks    Status Not Met      PT LONG TERM GOAL #3   Title Increase left knee flexion AROM to 110 deg or greater to improve ability for transfers from low seats and for stair navigation    Baseline 105-previously met with 110 deg but increased stiffness noted today    Time 6    Period Weeks    Status Not Met      PT LONG TERM GOAL #4   Title Increase left knee strength to 5/5 to improve ability to navigate steps at condo entrance and to improve ability for transfers from low seats    Baseline 5/5    Time 6    Period Weeks    Status Achieved      PT LONG TERM GOAL #5   Title Perform sit<>stand transfers from couch with left knee pain symptoms decreased 50% or greater form baseline status    Baseline not met    Time 6    Period Weeks    Status Not Met                 Plan - 09/08/20 1520    PT Home Exercise Plan Access code: [email or web address]: heel raises, standing hip abduction, sit<>stand from edge of bed, LAQ with Theraband, heel slides, side clams, bridge, SLR, partial squat at counter as potential alternative to sit<>stand, Bicep eccentric, shoulder Theraband ER/IR, Theraband shoulder ext and row, sleeper stretch at wall, gastroc stretch in standing  Patient will benefit from skilled therapeutic intervention in order to improve the following deficits and  impairments:     Visit Diagnosis: Acute pain of right shoulder  Pain in left lower leg  Chronic pain of left knee  Chronic pain of right knee  Other abnormalities of gait and mobility  Muscle weakness (generalized)     Problem List Patient Active Problem List   Diagnosis Date Noted  . Proteinuria 07/10/2019  . Unilateral primary osteoarthritis, left knee 09/13/2016  . Encounter for gynecological examination without abnormal finding 07/27/2016  . Sprain of medial collateral ligament of left knee 06/07/2016  . Chronic pain of left knee 06/02/2016  . Bipolar disorder, curr episode mixed, severe, with psychotic features ([place]) 08/26/2015  . History of posttraumatic stress disorder (PTSD) 08/26/2015  . Essential hypertension 08/26/2015  . History of eczema 04/08/2015  . Pedal edema 08/06/2014  . Positive ANA (antinuclear antibody) 12/19/2012  . Biological false positive RPR test 08/18/2012  . HYPERCHOLESTEROLEMIA 04/28/2009  . HIDRADENITIS SUPPURATIVA 09/06/2007  . OBESITY 04/17/2007       PHYSICAL THERAPY DISCHARGE SUMMARY  Visits from Start of Care: 16  Current functional level related to goals / functional outcomes: See above   Remaining deficits: Knee pain and stiffness   Education / Equipment: HEP Plan: Patient agrees to discharge.  Patient goals were not met. Patient is being discharged due to meeting the stated rehab goals.  ?????        Beaulah Dinning, PT, DPT 09/09/20 2:54 PM   [facility] [address] [place], [place], [address] Phone: [phone]   Fax:  [phone]  Name: Stacie Daugherty MRN: [id number] Date of Birth: 1961-01-16

## 2020-09-10 DIAGNOSIS — Z79899 Other long term (current) drug therapy: Secondary | ICD-10-CM | POA: Diagnosis not present

## 2020-09-14 ENCOUNTER — Telehealth: Payer: Self-pay | Admitting: Cardiology

## 2020-09-14 NOTE — Telephone Encounter (Signed)
Pt is requesting to switch Provider from Dr. Buford Dresser to Dr. Blima Singer. Pt states she the [place] office is too far from her home.

## 2020-09-29 ENCOUNTER — Other Ambulatory Visit: Payer: Self-pay

## 2020-09-29 ENCOUNTER — Ambulatory Visit (INDEPENDENT_AMBULATORY_CARE_PROVIDER_SITE_OTHER): Payer: Medicare Other | Admitting: Nurse Practitioner

## 2020-09-29 VITALS — BP 139/89 | HR 89 | Temp 98.8°F | Ht 64.0 in | Wt 287.0 lb

## 2020-09-29 DIAGNOSIS — I10 Essential (primary) hypertension: Secondary | ICD-10-CM

## 2020-09-29 NOTE — Progress Notes (Signed)
Pt came in wanting to have her blood check because she was high reading at home and wanted to know if dose medication

## 2020-09-30 NOTE — Telephone Encounter (Signed)
Ok by me

## 2020-10-02 ENCOUNTER — Other Ambulatory Visit: Payer: Self-pay | Admitting: Nurse Practitioner

## 2020-10-02 DIAGNOSIS — Z1231 Encounter for screening mammogram for malignant neoplasm of breast: Secondary | ICD-10-CM

## 2020-10-14 ENCOUNTER — Encounter: Payer: Self-pay | Admitting: Nurse Practitioner

## 2020-10-14 ENCOUNTER — Other Ambulatory Visit: Payer: Self-pay

## 2020-10-14 ENCOUNTER — Ambulatory Visit (INDEPENDENT_AMBULATORY_CARE_PROVIDER_SITE_OTHER): Payer: Medicare Other | Admitting: Nurse Practitioner

## 2020-10-14 VITALS — BP 142/91 | HR 72 | Temp 97.2°F | Ht 64.0 in | Wt 279.1 lb

## 2020-10-14 DIAGNOSIS — I10 Essential (primary) hypertension: Secondary | ICD-10-CM

## 2020-10-14 DIAGNOSIS — G8929 Other chronic pain: Secondary | ICD-10-CM

## 2020-10-14 DIAGNOSIS — M25562 Pain in left knee: Secondary | ICD-10-CM | POA: Diagnosis not present

## 2020-10-14 DIAGNOSIS — E7800 Pure hypercholesterolemia, unspecified: Secondary | ICD-10-CM | POA: Diagnosis not present

## 2020-10-14 DIAGNOSIS — F3164 Bipolar disorder, current episode mixed, severe, with psychotic features: Secondary | ICD-10-CM | POA: Diagnosis not present

## 2020-10-14 MED ORDER — HYDROCHLOROTHIAZIDE 25 MG PO TABS: 25.0000 mg | ORAL_TABLET | Freq: Every day | ORAL | 3 refills | Status: DC

## 2020-10-14 MED ORDER — HYDROCHLOROTHIAZIDE 25 MG PO TABS: 25.0000 mg | ORAL_TABLET | Freq: Every day | ORAL | 11 refills | Status: DC

## 2020-10-14 MED ORDER — LOSARTAN POTASSIUM 50 MG PO TABS: 50.0000 mg | ORAL_TABLET | Freq: Every day | ORAL | 3 refills | Status: DC
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
## 2020-10-14 NOTE — Patient Instructions (Signed)
Managing Your Hypertension Hypertension, also called high blood pressure, is when the force of the blood pressing against the walls of the arteries is too strong. Arteries are blood vessels that carry blood from your heart throughout your body. Hypertension forces the heart to work harder to pump blood and may cause the arteries to become narrow or stiff. Understanding blood pressure readings Your personal target blood pressure may vary depending on your medical conditions, your age, and other factors. A blood pressure reading includes a higher number over a lower number. Ideally, your blood pressure should be below 120/80. You should know that:  The first, or top, number is called the systolic pressure. It is a measure of the pressure in your arteries as your heart beats.  The second, or bottom number, is called the diastolic pressure. It is a measure of the pressure in your arteries as the heart relaxes. Blood pressure is classified into four stages. Based on your blood pressure reading, your health care provider may use the following stages to determine what type of treatment you need, if any. Systolic pressure and diastolic pressure are measured in a unit called mmHg. Normal  Systolic pressure: below 120.  Diastolic pressure: below 80. Elevated  Systolic pressure: 120-129.  Diastolic pressure: below 80. Hypertension stage 1  Systolic pressure: 130-139.  Diastolic pressure: 80-89. Hypertension stage 2  Systolic pressure: 140 or above.  Diastolic pressure: 90 or above. How can this condition affect me? Managing your hypertension is an important responsibility. Over time, hypertension can damage the arteries and decrease blood flow to important parts of the body, including the brain, heart, and kidneys. Having untreated or uncontrolled hypertension can lead to:  A heart attack.  A stroke.  A weakened blood vessel (aneurysm).  Heart failure.  Kidney damage.  Eye  damage.  Metabolic syndrome.  Memory and concentration problems.  Vascular dementia. What actions can I take to manage this condition? Hypertension can be managed by making lifestyle changes and possibly by taking medicines. Your health care provider will help you make a plan to bring your blood pressure within a normal range. Nutrition  Eat a diet that is high in fiber and potassium, and low in salt (sodium), added sugar, and fat. An example eating plan is called the Dietary Approaches to Stop Hypertension (DASH) diet. To eat this way: ? Eat plenty of fresh fruits and vegetables. Try to fill one-half of your plate at each meal with fruits and vegetables. ? Eat whole grains, such as whole-wheat pasta, brown rice, or whole-grain bread. Fill about one-fourth of your plate with whole grains. ? Eat low-fat dairy products. ? Avoid fatty cuts of meat, processed or cured meats, and poultry with skin. Fill about one-fourth of your plate with lean proteins such as fish, chicken without skin, beans, eggs, and tofu. ? Avoid pre-made and processed foods. These tend to be higher in sodium, added sugar, and fat.  Reduce your daily sodium intake. Most people with hypertension should eat less than 1,500 mg of sodium a day.   Lifestyle  Work with your health care provider to maintain a healthy body weight or to lose weight. Ask what an ideal weight is for you.  Get at least 30 minutes of exercise that causes your heart to beat faster (aerobic exercise) most days of the week. Activities may include walking, swimming, or biking.  Include exercise to strengthen your muscles (resistance exercise), such as weight lifting, as part of your weekly exercise routine. Try   to do these types of exercises for 30 minutes at least 3 days a week.  Do not use any products that contain nicotine or tobacco, such as cigarettes, e-cigarettes, and chewing tobacco. If you need help quitting, ask your health care  provider.  Control any long-term (chronic) conditions you have, such as high cholesterol or diabetes.  Identify your sources of stress and find ways to manage stress. This may include meditation, deep breathing, or making time for fun activities.   Alcohol use  Do not drink alcohol if: ? Your health care provider tells you not to drink. ? You are pregnant, may be pregnant, or are planning to become pregnant.  If you drink alcohol: ? Limit how much you use to:  0-1 drink a day for women.  0-2 drinks a day for men. ? Be aware of how much alcohol is in your drink. In the [place] one drink equals one 12 oz bottle of beer (355 mL), one 5 oz glass of wine (148 mL), or one 1 oz glass of hard liquor (44 mL). Medicines Your health care provider may prescribe medicine if lifestyle changes are not enough to get your blood pressure under control and if:  Your systolic blood pressure is 130 or higher.  Your diastolic blood pressure is 80 or higher. Take medicines only as told by your health care provider. Follow the directions carefully. Blood pressure medicines must be taken as told by your health care provider. The medicine does not work as well when you skip doses. Skipping doses also puts you at risk for problems. Monitoring Before you monitor your blood pressure:  Do not smoke, drink caffeinated beverages, or exercise within 30 minutes before taking a measurement.  Use the bathroom and empty your bladder (urinate).  Sit quietly for at least 5 minutes before taking measurements. Monitor your blood pressure at home as told by your health care provider. To do this:  Sit with your back straight and supported.  Place your feet flat on the floor. Do not cross your legs.  Support your arm on a flat surface, such as a table. Make sure your upper arm is at heart level.  Each time you measure, take two or three readings one minute apart and record the results. You may also need to have your  blood pressure checked regularly by your health care provider.   General information  Talk with your health care provider about your diet, exercise habits, and other lifestyle factors that may be contributing to hypertension.  Review all the medicines you take with your health care provider because there may be side effects or interactions.  Keep all visits as told by your health care provider. Your health care provider can help you create and adjust your plan for managing your high blood pressure. Where to find more information  National [employer], Lung, [employer] Institute: www.nhlbi.nih.gov  American Heart Association: www.heart.org Contact a health care provider if:  You think you are having a reaction to medicines you have taken.  You have repeated (recurrent) headaches.  You feel dizzy.  You have swelling in your ankles.  You have trouble with your vision. Get help right away if:  You develop a severe headache or confusion.  You have unusual weakness or numbness, or you feel faint.  You have severe pain in your chest or abdomen.  You vomit repeatedly.  You have trouble breathing. These symptoms may represent a serious problem that is an emergency. Do not wait   to see if the symptoms will go away. Get medical help right away. Call your local emergency services (911 in the [place] Do not drive yourself to the hospital. Summary  Hypertension is when the force of blood pumping through your arteries is too strong. If this condition is not controlled, it may put you at risk for serious complications.  Your personal target blood pressure may vary depending on your medical conditions, your age, and other factors. For most people, a normal blood pressure is less than 120/80.  Hypertension is managed by lifestyle changes, medicines, or both.  Lifestyle changes to help manage hypertension include losing weight, eating a healthy, low-sodium diet, exercising more, stopping smoking, and  limiting alcohol. This information is not intended to replace advice given to you by your health care provider. Make sure you discuss any questions you have with your health care provider. Document Revised: 06/21/2019 Document Reviewed: 04/16/2019 [employer] Patient Education  2021 [employer]  

## 2020-10-15 LAB — ARTHRITIS PANEL
Anti Nuclear Antibody (ANA): POSITIVE — AB
Rheumatoid fact SerPl-aCnc: 10 IU/mL (ref <14.0)
Sed Rate: 7 mm/hr (ref 0–40)
Uric Acid: 5.3 mg/dL (ref 3.0–7.2)

## 2020-10-15 LAB — LIPID PANEL
Chol/HDL Ratio: 2.2 ratio (ref 0.0–4.4)
Cholesterol, Total: 194 mg/dL (ref 100–199)
HDL: 89 mg/dL (ref >39)
LDL Chol Calc (NIH): 93 mg/dL (ref 0–99)
Triglycerides: 66 mg/dL (ref 0–149)
VLDL Cholesterol Cal: 12 mg/dL (ref 5–40)

## 2020-10-15 LAB — COMP. METABOLIC PANEL (12)
AST: 14 IU/L (ref 0–40)
Albumin/Globulin Ratio: 1.2 (ref 1.2–2.2)
Albumin: 4.3 g/dL (ref 3.8–4.9)
Alkaline Phosphatase: 56 IU/L (ref 44–121)
BUN/Creatinine Ratio: 15 (ref 9–23)
BUN: 14 mg/dL (ref 6–24)
Bilirubin Total: 0.2 mg/dL (ref 0.0–1.2)
Calcium: 10 mg/dL (ref 8.7–10.2)
Chloride: 102 mmol/L (ref 96–106)
Creatinine, Ser: 0.93 mg/dL (ref 0.57–1.00)
Globulin, Total: 3.6 g/dL (ref 1.5–4.5)
Glucose: 86 mg/dL (ref 65–99)
Potassium: 4.8 mmol/L (ref 3.5–5.2)
Sodium: 144 mmol/L (ref 134–144)
Total Protein: 7.9 g/dL (ref 6.0–8.5)
eGFR: 71 mL/min/{1.73_m2} (ref >59)

## 2020-10-20 ENCOUNTER — Ambulatory Visit: Payer: Medicare Other | Admitting: Podiatry

## 2020-11-17 ENCOUNTER — Ambulatory Visit: Payer: Medicare Other

## 2020-11-24 ENCOUNTER — Ambulatory Visit (INDEPENDENT_AMBULATORY_CARE_PROVIDER_SITE_OTHER): Payer: Medicare Other | Admitting: Nurse Practitioner

## 2020-11-24 VITALS — BP 136/88 | HR 78 | Temp 97.6°F | Resp 18 | Ht 64.0 in | Wt 282.0 lb

## 2020-11-24 DIAGNOSIS — Z0000 Encounter for general adult medical examination without abnormal findings: Secondary | ICD-10-CM

## 2020-11-24 NOTE — Patient Instructions (Addendum)
[name] , Thank you for taking time to come for your Medicare Wellness Visit. I appreciate your ongoing commitment to your health goals. Please review the following plan we discussed and let me know if I can assist you in the future.   These are the goals we discussed:  Goals      DIET - REDUCE SUGAR INTAKE        This is a list of the screening recommended for you and due dates:  Health Maintenance  Topic Date Due   Pneumococcal Vaccination (1 - PCV) Never done   Zoster (Shingles) Vaccine (1 of 2) Never done   COVID-19 Vaccine (3 - Pfizer risk series) 10/10/2019   Mammogram  11/26/2021   Pap Smear  10/17/2022   Colon Cancer Screening  07/23/2024   Tetanus Vaccine  07/14/2025   Hepatitis C Screening: USPSTF Recommendation to screen - Ages 18-79 yo.  Completed   HIV Screening  Completed   HPV Vaccine  Aged Out   Critical care medicine: Principles of diagnosis and management in the adult (4th ed., pp. [phone]). [place]."> Miller's anesthesia (8th ed., pp. [phone]). [place].">  Advance Directive  Advance directives are legal documents that allow you to make decisions about your health care and medical treatment in case you become unable to communicate for yourself. Advance directives let your wishes be known to family, friends,and health care providers. Discussing and writing advance directives should happen over time rather than all at once. Advance directives can be changed and updated at any time. There are different types of advance directives, such as: Medical power of attorney. Living will. Do not resuscitate (DNR) order or do not attempt resuscitation (DNAR) order. Health care proxy and medical power of attorney A health care proxy is also called a health care agent. This person is appointed to make medical decisions for you when you are unable to make decisions for yourself. Generally, people ask a trusted friend or family member to act as their proxy and  represent their preferences. Make sure you have an agreement with your trusted person to act as your proxy. A proxy may have tomake a medical decision on your behalf if your wishes are not known. A medical power of attorney, also called a durable power of attorney for health care, is a legal document that names your health care proxy. Depending on the laws in your state, the document may need to be: Signed. Notarized. Dated. Copied. Witnessed. Incorporated into your medical record. You may also want to appoint a trusted person to manage your money in the event you are unable to do so. This is called a durable power of attorney for finances. It is a separate legal document from the durable power of attorney for health care. You may choose your health care proxy or someone different toact as your agent in money matters. If you do not appoint a proxy, or there is a concern that the proxy is not acting in your best interest, a court may appoint a guardian to act on yourbehalf. Living will A living will is a set of instructions that state your wishes about medical care when you cannot express them yourself. Health care providers should keep a copy of your living will in your medical record. You may want to give a copy to family members or friends. To alert caregivers in case of an emergency, you can place a card in your wallet to let them know that you have a living  will and where they can find it. A living will is used if you become: Terminally ill. Disabled. Unable to communicate or make decisions. The following decisions should be included in your living will: To use or not to use life support equipment, such as dialysis machines and breathing machines (ventilators). Whether you want a DNR or DNAR order. This tells health care providers not to use cardiopulmonary resuscitation (CPR) if breathing or heartbeat stops. To use or not to use tube feeding. To be given or not to be given food and  fluids. Whether you want comfort (palliative) care when the goal becomes comfort rather than a cure. Whether you want to donate your organs and tissues. A living will does not give instructions for distributing your money andproperty if you should pass away. DNR or DNAR A DNR or DNAR order is a request not to have CPR in the event that your heart stops beating or you stop breathing. If a DNR or DNAR order has not been made and shared, a health care provider will try to help any patient whose heart has stopped or who has stopped breathing. If you plan to have surgery, talk with your health care provider about how your DNR or DNAR order will be followed ifproblems occur. What if I do not have an advance directive? Some states assign family decision makers to act on your behalf if you do not have an advance directive. Each state has its own laws about advance directives. You may want to check with your health care provider, attorney, orstate representative about the laws in your state. Summary Advance directives are legal documents that allow you to make decisions about your health care and medical treatment in case you become unable to communicate for yourself. The process of discussing and writing advance directives should happen over time. You can change and update advance directives at any time. Advance directives may include a medical power of attorney, a living will, and a DNR or DNAR order. This information is not intended to replace advice given to you by your health care provider. Make sure you discuss any questions you have with your healthcare provider. Document Revised: 02/18/2020 Document Reviewed: 02/18/2020 Elsevier Patient Education  2022 [place] and Cholesterol Restricted Eating Plan Getting too much fat and cholesterol in your diet may cause health problems. Choosing the right foods helps keep your fat and cholesterol at normal levels.This can keep you from getting certain  diseases. Your doctor may recommend an eating plan that includes: Total fat: ______% or less of total calories a day. Saturated fat: ______% or less of total calories a day. Cholesterol: less than _________mg a day. Fiber: ______g a day. What are tips for following this plan? Meal planning At meals, divide your plate into four equal parts: Fill one-half of your plate with vegetables and green salads. Fill one-fourth of your plate with whole grains. Fill one-fourth of your plate with low-fat (lean) protein foods. Eat fish that is high in omega-3 fats at least two times a week. This includes mackerel, tuna, sardines, and salmon. Eat foods that are high in fiber, such as whole grains, beans, apples, broccoli, carrots, peas, and barley. General tips  Work with your doctor to lose weight if you need to. Avoid: Foods with added sugar. Fried foods. Foods with partially hydrogenated oils. Limit alcohol intake to no more than 1 drink a day for nonpregnant women and 2 drinks a day for men. One drink equals 12 oz  of beer, 5 oz of wine, or 1 oz of hard liquor.  Reading food labels Check food labels for: Trans fats. Partially hydrogenated oils. Saturated fat (g) in each serving. Cholesterol (mg) in each serving. Fiber (g) in each serving. Choose foods with healthy fats, such as: Monounsaturated fats. Polyunsaturated fats. Omega-3 fats. Choose grain products that have whole grains. Look for the word "whole" as the first word in the ingredient list. Cooking Cook foods using low-fat methods. These include baking, boiling, grilling, and broiling. Eat more home-cooked foods. Eat at restaurants and buffets less often. Avoid cooking using saturated fats, such as butter, cream, palm oil, palm kernel oil, and coconut oil. Recommended foods  Fruits All fresh, canned (in natural juice), or frozen fruits. Vegetables Fresh or frozen vegetables (raw, steamed, roasted, or grilled). Green  salads. Grains Whole grains, such as whole wheat or whole grain breads, crackers, cereals, and pasta. Unsweetened oatmeal, bulgur, barley, quinoa, or brown rice. Corn or whole wheat flour tortillas. Meats and other protein foods Ground beef (85% or leaner), grass-fed beef, or beef trimmed of fat. Skinless chicken or [place]. Ground chicken or [place]. Pork trimmed of fat. All fish and seafood. Egg whites. Dried beans, peas, or lentils. Unsalted nuts or seeds. Unsalted canned beans. Nut butters without added sugar or oil. Dairy Low-fat or nonfat dairy products, such as skim or 1% milk, 2% or reduced-fat cheeses, low-fat and fat-free ricotta or cottage cheese, or plain low-fat and nonfat yogurt. Fats and oils Tub margarine without trans fats. Light or reduced-fat mayonnaise and salad dressings. Avocado. Olive, canola, sesame, or safflower oils. The items listed above may not be a complete list of foods and beverages youcan eat. Contact a dietitian for more information. Foods to avoid Fruits Canned fruit in heavy syrup. Fruit in cream or butter sauce. Fried fruit. Vegetables Vegetables cooked in cheese, cream, or butter sauce. Fried vegetables. Grains White bread. White pasta. White rice. Cornbread. Bagels, pastries, and croissants. Crackers and snack foods that contain trans fat and hydrogenated oils. Meats and other protein foods Fatty cuts of meat. Ribs, chicken wings, bacon, sausage, bologna, salami, chitterlings, fatback, hot dogs, bratwurst, and packaged lunch meats. Liver and organ meats. Whole eggs and egg yolks. Chicken and [place] with skin. Fried meat. Dairy Whole or 2% milk, cream, half-and-half, and cream cheese. Whole milk cheeses. Whole-fat or sweetened yogurt. Full-fat cheeses. Nondairy creamers and whipped toppings. Processed cheese, cheese spreads, and cheese curds. Beverages Alcohol. Sugar-sweetened drinks such as sodas, lemonade, and fruit drinks. Fats and oils Butter, stick  margarine, lard, shortening, ghee, or bacon fat. Coconut, palm kernel, and palm oils. Sweets and desserts Corn syrup, sugars, honey, and molasses. Candy. Jam and jelly. Syrup. Sweetened cereals. Cookies, pies, cakes, donuts, muffins, and ice cream. The items listed above may not be a complete list of foods and beverages youshould avoid. Contact a dietitian for more information. Summary Choosing the right foods helps keep your fat and cholesterol at normal levels. This can keep you from getting certain diseases. At meals, fill one-half of your plate with vegetables and green salads. Eat high-fiber foods, like whole grains, beans, apples, carrots, peas, and barley. Limit added sugar, saturated fats, alcohol, and fried foods. This information is not intended to replace advice given to you by your health care provider. Make sure you discuss any questions you have with your healthcare provider. Document Revised: 09/18/2019 Document Reviewed: 09/18/2019 Elsevier Patient Education  2022 [place] Prevention in the Home, Adult [place]  can cause injuries and can happen to people of all ages. There are many things you can do to make your home safe and to help prevent falls. Ask forhelp when making these changes. What actions can I take to prevent falls? General Instructions Use good lighting in all rooms. Replace any light bulbs that burn out. Turn on the lights in dark areas. Use night-lights. Keep items that you use often in easy-to-reach places. Lower the shelves around your home if needed. Set up your furniture so you have a clear path. Avoid moving your furniture around. Do not have throw rugs or other things on the floor that can make you trip. Avoid walking on wet floors. If any of your floors are uneven, fix them. Add color or contrast paint or tape to clearly mark and help you see: Grab bars or handrails. First and last steps of staircases. Where the edge of each step is. If you  use a stepladder: Make sure that it is fully opened. Do not climb a closed stepladder. Make sure the sides of the stepladder are locked in place. Ask someone to hold the stepladder while you use it. Know where your pets are when moving through your home. What can I do in the bathroom?     Keep the floor dry. Clean up any water on the floor right away. Remove soap buildup in the tub or shower. Use nonskid mats or decals on the floor of the tub or shower. Attach bath mats securely with double-sided, nonslip rug tape. If you need to sit down in the shower, use a plastic, nonslip stool. Install grab bars by the toilet and in the tub and shower. Do not use towel bars as grab bars. What can I do in the bedroom? Make sure that you have a light by your bed that is easy to reach. Do not use any sheets or blankets for your bed that hang to the floor. Have a firm chair with side arms that you can use for support when you get dressed. What can I do in the kitchen? Clean up any spills right away. If you need to reach something above you, use a step stool with a grab bar. Keep electrical cords out of the way. Do not use floor polish or wax that makes floors slippery. What can I do with my stairs? Do not leave any items on the stairs. Make sure that you have a light switch at the top and the bottom of the stairs. Make sure that there are handrails on both sides of the stairs. Fix handrails that are broken or loose. Install nonslip stair treads on all your stairs. Avoid having throw rugs at the top or bottom of the stairs. Choose a carpet that does not hide the edge of the steps on the stairs. Check carpeting to make sure that it is firmly attached to the stairs. Fix carpet that is loose or worn. What can I do on the outside of my home? Use bright outdoor lighting. Fix the edges of walkways and driveways and fix any cracks. Remove anything that might make you trip as you walk through a door, such  as a raised step or threshold. Trim any bushes or trees on paths to your home. Check to see if handrails are loose or broken and that both sides of all steps have handrails. Install guardrails along the edges of any raised decks and porches. Clear paths of anything that can make you trip, such as  tools or rocks. Have leaves, snow, or ice cleared regularly. Use sand or salt on paths during winter. Clean up any spills in your garage right away. This includes grease or oil spills. What other actions can I take? Wear shoes that: Have a low heel. Do not wear high heels. Have rubber bottoms. Feel good on your feet and fit well. Are closed at the toe. Do not wear open-toe sandals. Use tools that help you move around if needed. These include: Canes. Walkers. Scooters. Crutches. Review your medicines with your doctor. Some medicines can make you feel dizzy. This can increase your chance of falling. Ask your doctor what else you can do to help prevent falls. Where to find more information Centers for Disease Control and Prevention, [place]: http://www.wolf.info/ National Institute on Aging: http://kim-miller.com/ Contact a doctor if: You are afraid of falling at home. You feel weak, drowsy, or dizzy at home. You fall at home. Summary There are many simple things that you can do to make your home safe and to help prevent falls. Ways to make your home safe include removing things that can make you trip and installing grab bars in the bathroom. Ask for help when making these changes in your home. This information is not intended to replace advice given to you by your health care provider. Make sure you discuss any questions you have with your healthcare provider. Document Revised: 12/18/2019 Document Reviewed: 12/18/2019 Elsevier Patient Education  [place] Maintenance, Female Adopting a healthy lifestyle and getting preventive care are important in promoting health and wellness. Ask your  health care provider about: The right schedule for you to have regular tests and exams. Things you can do on your own to prevent diseases and keep yourself healthy. What should I know about diet, weight, and exercise? Eat a healthy diet  Eat a diet that includes plenty of vegetables, fruits, low-fat dairy products, and lean protein. Do not eat a lot of foods that are high in solid fats, added sugars, or sodium.  Maintain a healthy weight Body mass index (BMI) is used to identify weight problems. It estimates body fat based on height and weight. Your health care provider can help determineyour BMI and help you achieve or maintain a healthy weight. Get regular exercise Get regular exercise. This is one of the most important things you can do for your health. Most adults should: Exercise for at least 150 minutes each week. The exercise should increase your heart rate and make you sweat (moderate-intensity exercise). Do strengthening exercises at least twice a week. This is in addition to the moderate-intensity exercise. Spend less time sitting. Even light physical activity can be beneficial. Watch cholesterol and blood lipids Have your blood tested for lipids and cholesterol at 59 years of age, then havethis test every 5 years. Have your cholesterol levels checked more often if: Your lipid or cholesterol levels are high. You are older than 59 years of age. You are at high risk for heart disease. What should I know about cancer screening? Depending on your health history and family history, you may need to have cancer screening at various ages. This may include screening for: Breast cancer. Cervical cancer. Colorectal cancer. Skin cancer. Lung cancer. What should I know about heart disease, diabetes, and high blood pressure? Blood pressure and heart disease High blood pressure causes heart disease and increases the risk of stroke. This is more likely to develop in people who have high  blood pressure readings,  are of African descent, or are overweight. Have your blood pressure checked: Every 3-5 years if you are 44-67 years of age. Every year if you are 21 years old or older. Diabetes Have regular diabetes screenings. This checks your fasting blood sugar level. Have the screening done: Once every three years after age 88 if you are at a normal weight and have a low risk for diabetes. More often and at a younger age if you are overweight or have a high risk for diabetes. What should I know about preventing infection? Hepatitis B If you have a higher risk for hepatitis B, you should be screened for this virus. Talk with your health care provider to find out if you are at risk forhepatitis B infection. Hepatitis C Testing is recommended for: Everyone born from [birth year] through 1964. Anyone with known risk factors for hepatitis C. Sexually transmitted infections (STIs) Get screened for STIs, including gonorrhea and chlamydia, if: You are sexually active and are younger than 59 years of age. You are older than 59 years of age and your health care provider tells you that you are at risk for this type of infection. Your sexual activity has changed since you were last screened, and you are at increased risk for chlamydia or gonorrhea. Ask your health care provider if you are at risk. Ask your health care provider about whether you are at high risk for HIV. Your health care provider may recommend a prescription medicine to help prevent HIV infection. If you choose to take medicine to prevent HIV, you should first get tested for HIV. You should then be tested every 3 months for as long as you are taking the medicine. Pregnancy If you are about to stop having your period (premenopausal) and you may become pregnant, seek counseling before you get pregnant. Take 400 to 800 micrograms (mcg) of folic acid every day if you become pregnant. Ask for birth control (contraception) if you want to  prevent pregnancy. Osteoporosis and menopause Osteoporosis is a disease in which the bones lose minerals and strength with aging. This can result in bone fractures. If you are 5 years old or older, or if you are at risk for osteoporosis and fractures, ask your health care provider if you should: Be screened for bone loss. Take a calcium or vitamin D supplement to lower your risk of fractures. Be given hormone replacement therapy (HRT) to treat symptoms of menopause. Follow these instructions at home: Lifestyle Do not use any products that contain nicotine or tobacco, such as cigarettes, e-cigarettes, and chewing tobacco. If you need help quitting, ask your health care provider. Do not use street drugs. Do not share needles. Ask your health care provider for help if you need support or information about quitting drugs. Alcohol use Do not drink alcohol if: Your health care provider tells you not to drink. You are pregnant, may be pregnant, or are planning to become pregnant. If you drink alcohol: Limit how much you use to 0-1 drink a day. Limit intake if you are breastfeeding. Be aware of how much alcohol is in your drink. In the [place] one drink equals one 12 oz bottle of beer (355 mL), one 5 oz glass of wine (148 mL), or one 1 oz glass of hard liquor (44 mL). General instructions Schedule regular health, dental, and eye exams. Stay current with your vaccines. Tell your health care provider if: You often feel depressed. You have ever been abused or do not feel safe at  home. Summary Adopting a healthy lifestyle and getting preventive care are important in promoting health and wellness. Follow your health care provider's instructions about healthy diet, exercising, and getting tested or screened for diseases. Follow your health care provider's instructions on monitoring your cholesterol and blood pressure. This information is not intended to replace advice given to you by your health care  provider. Make sure you discuss any questions you have with your healthcare provider. Document Revised: 05/09/2018 Document Reviewed: 05/09/2018 Elsevier Patient Education  2022 [employer].
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
## 2020-11-27 ENCOUNTER — Ambulatory Visit: Payer: Medicare Other

## 2020-12-11 ENCOUNTER — Telehealth: Payer: Self-pay

## 2020-12-18 NOTE — Telephone Encounter (Signed)
noted 

## 2020-12-29 ENCOUNTER — Ambulatory Visit: Payer: Medicare Other

## 2020-12-31 ENCOUNTER — Ambulatory Visit
Admission: RE | Admit: 2020-12-31 | Discharge: 2020-12-31 | Disposition: A | Discharge status: Home / Self Care | Payer: Medicare Other | Source: Ambulatory Visit | Attending: Nurse Practitioner | Admitting: Nurse Practitioner

## 2020-12-31 ENCOUNTER — Other Ambulatory Visit: Payer: Self-pay

## 2020-12-31 DIAGNOSIS — Z1231 Encounter for screening mammogram for malignant neoplasm of breast: Secondary | ICD-10-CM | POA: Diagnosis not present

## 2021-01-06 ENCOUNTER — Other Ambulatory Visit: Payer: Self-pay | Admitting: Nurse Practitioner

## 2021-01-06 DIAGNOSIS — I10 Essential (primary) hypertension: Secondary | ICD-10-CM

## 2021-01-13 ENCOUNTER — Encounter (INDEPENDENT_AMBULATORY_CARE_PROVIDER_SITE_OTHER): Payer: Self-pay | Admitting: Family Medicine

## 2021-01-13 ENCOUNTER — Ambulatory Visit (INDEPENDENT_AMBULATORY_CARE_PROVIDER_SITE_OTHER): Payer: Medicare Other | Admitting: Family Medicine

## 2021-01-13 ENCOUNTER — Other Ambulatory Visit: Payer: Self-pay

## 2021-01-13 VITALS — BP 133/82 | HR 62 | Temp 98.0°F | Ht 63.0 in | Wt 272.0 lb

## 2021-01-13 DIAGNOSIS — Z6841 Body Mass Index (BMI) 40.0 and over, adult: Secondary | ICD-10-CM | POA: Diagnosis not present

## 2021-01-13 DIAGNOSIS — E538 Deficiency of other specified B group vitamins: Secondary | ICD-10-CM | POA: Diagnosis not present

## 2021-01-13 DIAGNOSIS — E559 Vitamin D deficiency, unspecified: Secondary | ICD-10-CM

## 2021-01-13 DIAGNOSIS — E7849 Other hyperlipidemia: Secondary | ICD-10-CM

## 2021-01-13 DIAGNOSIS — R7303 Prediabetes: Secondary | ICD-10-CM

## 2021-01-13 DIAGNOSIS — I10 Essential (primary) hypertension: Secondary | ICD-10-CM

## 2021-01-13 DIAGNOSIS — Z1331 Encounter for screening for depression: Secondary | ICD-10-CM

## 2021-01-13 DIAGNOSIS — R0602 Shortness of breath: Secondary | ICD-10-CM

## 2021-01-13 DIAGNOSIS — R5383 Other fatigue: Secondary | ICD-10-CM

## 2021-01-14 ENCOUNTER — Encounter: Payer: Medicare Other | Admitting: Nurse Practitioner

## 2021-01-14 LAB — COMPREHENSIVE METABOLIC PANEL
ALT: 16 IU/L (ref 0–32)
AST: 18 IU/L (ref 0–40)
Albumin/Globulin Ratio: 1.4 (ref 1.2–2.2)
Albumin: 4.3 g/dL (ref 3.8–4.9)
Alkaline Phosphatase: 55 IU/L (ref 44–121)
BUN/Creatinine Ratio: 20 (ref 9–23)
BUN: 17 mg/dL (ref 6–24)
Bilirubin Total: 0.2 mg/dL (ref 0.0–1.2)
CO2: 26 mmol/L (ref 20–29)
Calcium: 9.6 mg/dL (ref 8.7–10.2)
Chloride: 104 mmol/L (ref 96–106)
Creatinine, Ser: 0.85 mg/dL (ref 0.57–1.00)
Globulin, Total: 3 g/dL (ref 1.5–4.5)
Glucose: 91 mg/dL (ref 65–99)
Potassium: 4.4 mmol/L (ref 3.5–5.2)
Sodium: 143 mmol/L (ref 134–144)
Total Protein: 7.3 g/dL (ref 6.0–8.5)
eGFR: 79 mL/min/{1.73_m2} (ref >59)

## 2021-01-14 LAB — TSH: TSH: 1.26 u[IU]/mL (ref 0.450–4.500)

## 2021-01-14 LAB — CBC WITH DIFFERENTIAL
Basophils Absolute: 0 10*3/uL (ref 0.0–0.2)
Basos: 0 %
EOS (ABSOLUTE): 0.2 10*3/uL (ref 0.0–0.4)
Eos: 4 %
Hematocrit: 39.7 % (ref 34.0–46.6)
Hemoglobin: 13.1 g/dL (ref 11.1–15.9)
Immature Grans (Abs): 0 10*3/uL (ref 0.0–0.1)
Immature Granulocytes: 0 %
Lymphocytes Absolute: 1.9 10*3/uL (ref 0.7–3.1)
Lymphs: 42 %
MCH: 31.8 pg (ref 26.6–33.0)
MCHC: 33 g/dL (ref 31.5–35.7)
MCV: 96 fL (ref 79–97)
Monocytes Absolute: 0.4 10*3/uL (ref 0.1–0.9)
Monocytes: 9 %
Neutrophils Absolute: 2 10*3/uL (ref 1.4–7.0)
Neutrophils: 45 %
RBC: 4.12 x10E6/uL (ref 3.77–5.28)
RDW: 12.9 % (ref 11.7–15.4)
WBC: 4.5 10*3/uL (ref 3.4–10.8)

## 2021-01-14 LAB — LIPID PANEL WITH LDL/HDL RATIO
Cholesterol, Total: 186 mg/dL (ref 100–199)
HDL: 84 mg/dL (ref >39)
LDL Chol Calc (NIH): 91 mg/dL (ref 0–99)
LDL/HDL Ratio: 1.1 ratio (ref 0.0–3.2)
Triglycerides: 60 mg/dL (ref 0–149)
VLDL Cholesterol Cal: 11 mg/dL (ref 5–40)

## 2021-01-14 LAB — T4, FREE: Free T4: 1.16 ng/dL (ref 0.82–1.77)

## 2021-01-14 LAB — T3: T3, Total: 153 ng/dL (ref 71–180)

## 2021-01-14 LAB — INSULIN, RANDOM: INSULIN: 14 u[IU]/mL (ref 2.6–24.9)

## 2021-01-14 LAB — FOLATE: Folate: 9.7 ng/mL (ref >3.0)

## 2021-01-14 LAB — HEMOGLOBIN A1C
Est. average glucose Bld gHb Est-mCnc: 123 mg/dL
Hgb A1c MFr Bld: 5.9 % — ABNORMAL HIGH (ref 4.8–5.6)

## 2021-01-14 LAB — VITAMIN D 25 HYDROXY (VIT D DEFICIENCY, FRACTURES): Vit D, 25-Hydroxy: 43.7 ng/mL (ref 30.0–100.0)

## 2021-01-14 LAB — VITAMIN B12: Vitamin B-12: 660 pg/mL (ref 232–1245)

## 2021-01-14 NOTE — Progress Notes (Signed)
Chief Complaint:   OBESITY Stacie Daugherty (MR# [id number]) is a 60 y.o. female who presents for evaluation and treatment of obesity and related comorbidities. Current BMI is Body mass index is 48.18 kg/m. Stacie Daugherty has been struggling with her weight for many years and has been unsuccessful in either losing weight, maintaining weight loss, or reaching her healthy weight goal.  Stacie Daugherty noted gaining weight (50 lbs) with bipolar medications. She is ready to work on losing weight now.  Stacie Daugherty is currently in the action stage of change and ready to dedicate time achieving and maintaining a healthier weight. Stacie Daugherty is interested in becoming our patient and working on intensive lifestyle modifications including (but not limited to) diet and exercise for weight loss.  Stacie Daugherty's habits were reviewed today and are as follows: her desired weight loss is 72 lbs, she has been heavy most of her life, she started gaining weight when she started taking several bipolar medications, she has significant food cravings issues, she skips meals frequently, she is frequently drinking liquids with calories, she frequently makes poor food choices, and she struggles with emotional eating.  Depression Screen [name]'s Food and Mood (modified PHQ-9) Daugherty was 13.  Depression screen PHQ 2/9 01/13/2021  Decreased Interest 2  Down, Depressed, Hopeless 2  PHQ - 2 Daugherty 4  Altered sleeping 1  Tired, decreased energy 2  Change in appetite 1  Feeling bad or failure about yourself  2  Trouble concentrating 2  Moving slowly or fidgety/restless 1  Suicidal thoughts 0  PHQ-9 Daugherty 13  Difficult doing work/chores Somewhat difficult  Some recent data might be hidden   Subjective:   1. Other fatigue Theo admits to daytime somnolence and admits to waking up still tired. Patent has a history of symptoms of daytime fatigue. [name] generally gets 5 or 6 hours of sleep per night, and states that she has nightime awakenings.  Stacie Daugherty is present. Apneic episodes are not present. Stacie Daugherty is 13.  2. SOB (shortness of breath) on exertion Stacie Daugherty notes increasing shortness of breath with exercising and seems to be worsening over time with weight gain. She notes getting out of breath sooner with activity than she used to. This has not gotten worse recently. Stacie Daugherty denies shortness of breath at rest or orthopnea.  3. Pre-diabetes Stacie Daugherty has a history of elevated A1c in the past. She is ready to work on diet and exercise.  4. Other hyperlipidemia [name] is on Lipitor, and she is working on diet and exercise. She denies chest pain.  5. Vitamin D deficiency Stacie Daugherty is on Vit D OTC, and she is due for labs and she notes fatigue.  6. B12 deficiency Stacie Daugherty is on multivitamins, and she is due for labs and notes fatigue.  7. Essential hypertension Stacie Daugherty's blood pressure is stable today on her medications. She is working on diet and exercise.  Assessment/Plan:   1. Other fatigue Stacie Daugherty does feel that her weight is causing her energy to be lower than it should be. Fatigue may be related to obesity, depression or many other causes. Labs will be ordered, and in the meanwhile, Stacie Daugherty will focus on self care including making healthy food choices, increasing physical activity and focusing on stress reduction.  - Comprehensive metabolic panel - TSH - CBC With Differential - EKG 12-Lead  2. SOB (shortness of breath) on exertion Stacie Daugherty does feel that she gets out of breath more easily that she used to when she exercises.  Stacie Daugherty's shortness of breath appears to be obesity related and exercise induced. She has agreed to work on weight loss and gradually increase exercise to treat her exercise induced shortness of breath. Will continue to monitor closely.  3. Pre-diabetes Stacie Daugherty will start her Category 2 plan, and will continue to work on weight loss, exercise, and decreasing simple carbohydrates to help decrease the risk of diabetes. We  will check labs today.  - Insulin, random - Hemoglobin A1c  4. Other hyperlipidemia Cardiovascular risk and specific lipid/LDL goals reviewed. We discussed several lifestyle modifications today. We will check labs today. Stacie Daugherty will start her Category 2 plan, and will continue to work on diet, exercise and weight loss efforts. Orders and follow up as documented in patient record.   - Lipid Panel With LDL/HDL Ratio  5. Vitamin D deficiency Low Vitamin D level contributes to fatigue and are associated with obesity, breast, and colon cancer. We will check labs today. Stacie Daugherty will follow-up for routine testing of Vitamin D, at least 2-3 times per year to avoid over-replacement.  - VITAMIN D 25 Hydroxy (Vit-D Deficiency, Fractures)  6. B12 deficiency The diagnosis was reviewed with the patient. We will check labs today, and Ayssa will continue to follow up as directed. Orders and follow up as documented in patient record.  - Vitamin B12  7. Essential hypertension Stacie Daugherty will start her Category 2 plan, and will work on healthy weight loss and exercise to improve blood pressure control. We will watch for signs of hypotension as she continues her lifestyle modifications.  8. Screening for depression [name] had a positive depression screening. Depression is commonly associated with obesity and often results in emotional eating behaviors. We will monitor this closely and work on CBT to help improve the non-hunger eating patterns. Referral to Psychology may be required if no improvement is seen as she continues in our clinic.  9. Obesity with current BMI 48.3 Stacie Daugherty is currently in the action stage of change and her goal is to continue with weight loss efforts. I recommend Shilpa begin the structured treatment plan as follows:  She has agreed to the Category 2 Plan.  Exercise goals: No exercise has been prescribed for now, while we concentrate on nutritional changes.  Behavioral modification strategies:  increasing lean protein intake and no skipping meals.  She was informed of the importance of frequent follow-up visits to maximize her success with intensive lifestyle modifications for her multiple health conditions. She was informed we would discuss her lab results at her next visit unless there is a critical issue that needs to be addressed sooner. Zada agreed to keep her next visit at the agreed upon time to discuss these results.  Objective:   Blood pressure 133/82, pulse 62, temperature 98 F (36.7 C), height '5\' 3"'$  (1.6 m), weight 272 lb (123.4 kg), SpO2 97 %. Body mass index is 48.18 kg/m.  EKG: Normal sinus rhythm, rate 66 BPM.  Indirect Calorimeter completed today shows a VO2 of 246 and a REE of 1699.  Her calculated basal metabolic rate is [id number] thus her basal metabolic rate is worse than expected.  General: Cooperative, alert, well developed, in no acute distress. HEENT: Conjunctivae and lids unremarkable. Cardiovascular: Regular rhythm.  Lungs: Normal work of breathing. Neurologic: No focal deficits.   Lab Results  Component Value Date   CREATININE 0.85 01/13/2021   BUN 17 01/13/2021   NA 143 01/13/2021   K 4.4 01/13/2021   CL 104 01/13/2021   CO2  26 01/13/2021   Lab Results  Component Value Date   ALT 16 01/13/2021   AST 18 01/13/2021   ALKPHOS 55 01/13/2021   BILITOT 0.2 01/13/2021   Lab Results  Component Value Date   HGBA1C 5.9 (H) 01/13/2021   HGBA1C 5.5 01/13/2020   HGBA1C 5.5 01/13/2020   HGBA1C 5.5 (A) 01/13/2020   HGBA1C 5.3 07/12/2016   Lab Results  Component Value Date   INSULIN 14.0 01/13/2021   Lab Results  Component Value Date   TSH 1.260 01/13/2021   Lab Results  Component Value Date   CHOL 186 01/13/2021   HDL 84 01/13/2021   LDLCALC 91 01/13/2021   TRIG 60 01/13/2021   CHOLHDL 2.2 10/14/2020   Lab Results  Component Value Date   WBC 4.5 01/13/2021   HGB 13.1 01/13/2021   HCT 39.7 01/13/2021   MCV 96 01/13/2021   PLT 241  04/15/2020   Lab Results  Component Value Date   IRON 69 08/07/2014   TIBC 288 08/07/2014   FERRITIN 56 08/07/2014   Obesity Behavioral Intervention:   Approximately 15 minutes were spent on the discussion below.  ASK: We discussed the diagnosis of obesity with Stacie Daugherty today and Jatia agreed to give [place] permission to discuss obesity behavioral modification therapy today.  ASSESS: [place] has the diagnosis of obesity and her BMI today is 48.3. Kynesha is in the action stage of change.   ADVISE: Janacia was educated on the multiple health risks of obesity as well as the benefit of weight loss to improve her health. She was advised of the need for long term treatment and the importance of lifestyle modifications to improve her current health and to decrease her risk of future health problems.  AGREE: Multiple dietary modification options and treatment options were discussed and Dmiyah agreed to follow the recommendations documented in the above note.  ARRANGE: Lateefah was educated on the importance of frequent visits to treat obesity as outlined per CMS and USPSTF guidelines and agreed to schedule her next follow up appointment today.  Attestation Statements:   Reviewed by clinician on day of visit: allergies, medications, problem list, medical history, surgical history, family history, social history, and previous encounter notes.   I, Trixie Dredge, am acting as transcriptionist for Dennard Nip, MD.  I have reviewed the above documentation for accuracy and completeness, and I agree with the above. - Dennard Nip, MD

## 2021-01-20 ENCOUNTER — Ambulatory Visit (INDEPENDENT_AMBULATORY_CARE_PROVIDER_SITE_OTHER): Payer: Medicare Other | Admitting: Nurse Practitioner

## 2021-01-20 ENCOUNTER — Encounter: Payer: Self-pay | Admitting: Nurse Practitioner

## 2021-01-20 ENCOUNTER — Other Ambulatory Visit: Payer: Self-pay

## 2021-01-20 VITALS — BP 150/88 | HR 72 | Temp 97.6°F | Ht 64.0 in | Wt 275.0 lb

## 2021-01-20 DIAGNOSIS — I10 Essential (primary) hypertension: Secondary | ICD-10-CM | POA: Diagnosis not present

## 2021-01-20 DIAGNOSIS — Z124 Encounter for screening for malignant neoplasm of cervix: Secondary | ICD-10-CM

## 2021-01-20 DIAGNOSIS — M79601 Pain in right arm: Secondary | ICD-10-CM | POA: Diagnosis not present

## 2021-01-20 LAB — POCT URINALYSIS DIP (CLINITEK)
Bilirubin, UA: NEGATIVE
Blood, UA: NEGATIVE
Glucose, UA: NEGATIVE mg/dL
Ketones, POC UA: NEGATIVE mg/dL
Leukocytes, UA: NEGATIVE
Nitrite, UA: NEGATIVE
POC PROTEIN,UA: NEGATIVE
Spec Grav, UA: 1.02 (ref 1.010–1.025)
Urobilinogen, UA: 0.2 E.U./dL
pH, UA: 7 (ref 5.0–8.0)

## 2021-01-20 NOTE — Patient Instructions (Addendum)
Tennis Elbow  Tennis elbow (lateral epicondylitis) is inflammation of tendons in your outer forearm, near your elbow. Tendons are tissues that connect muscle to bone. When you have tennis elbow, inflammation affects the tendons that you use to bend your wrist and move your hand up. Inflammation occurs in the lower part of the upper arm bone (humerus), where the tendons connect to the bone (lateral epicondyle). Tennis elbow often affects people who play tennis, but anyone may get thecondition from repeatedly extending the wrist or turning the forearm. What are the causes? This condition is usually caused by repeatedly extending the wrist, turning the forearm, and using the hands. It can result from sports or work that requires repetitive forearm movements. In some cases, it may be caused by a suddeninjury. What increases the risk? You are more likely to develop tennis elbow if you play tennis or another racket sport. You also have a higher risk if you frequently use your hands for work. Besides people who play tennis, others at greater risk include: People who use computers. Architect workers. People who work in [employer]. Musicians. Cooks. Cashiers. What are the signs or symptoms? Symptoms of this condition include: Pain and tenderness in the forearm and the outer part of the elbow. Pain may be felt only when using the arm, or it may be there all the time. A burning feeling that starts in the elbow and spreads down the forearm. A weak grip in the hand. How is this diagnosed? This condition is diagnosed based on your symptoms, your medical history, and aphysical exam. You may also have X-rays or an MRI to: Confirm the diagnosis. Look for other issues. Check for tears in the ligaments, muscles, or tendons. How is this treated? Resting and icing your arm is often the first treatment. Your health care provider may also recommend: Medicines to reduce pain and inflammation. These may be in  the form of a pill, topical gels, or shots of a steroid medicine (cortisone). An elbow strap to reduce stress on the area. Physical therapy. This may include massage or exercises or both. An elbow brace to restrict the movements that cause symptoms. If these treatments do not help relieve your symptoms, your health care provider may recommend surgery to remove damaged muscle and reattach healthymuscle to bone. Follow these instructions at home: If you have a brace or strap: Wear the brace or strap as told by your health care provider. Remove it only as told by your health care provider. Check the skin around the brace or strap every day. Tell your health care provider about any concerns. Loosen the brace if your fingers tingle, become numb, or turn cold and blue. Keep the brace clean. If the brace or strap is not waterproof: Do not let it get wet. Cover it with a watertight covering when you take a bath or a shower. Managing pain, stiffness, and swelling  If directed, put ice on the injured area. To do this: If you have a removable brace or strap, remove it as told by your health care provider. Put ice in a plastic bag. Place a towel between your skin and the bag. Leave the ice on for 20 minutes, 2-3 times a day. Remove the ice if your skin turns bright red. This is very important. If you cannot feel pain, heat, or cold, you have a greater risk of damage to the area. Move your fingers often to reduce stiffness and swelling.  Activity Rest your elbow and wrist and  avoid activities that cause symptoms as told by your health care provider. Do physical therapy exercises as told by your health care provider. If you lift an object, lift it with your palm facing up. This reduces stress on your elbow. Lifestyle If your tennis elbow is caused by sports, check your equipment and make sure that: You use it correctly. It is good match for you. If your tennis elbow is caused by work or computer  use, take frequent breaks to stretch your arm. Talk with your employer about ways to manage your condition at work. General instructions Take over-the-counter and prescription medicines only as told by your health care provider. Do not use any products that contain nicotine or tobacco. These products include cigarettes, chewing tobacco, and vaping devices, such as e-cigarettes. If you need help quitting, ask your health care provider. Keep all follow-up visits. This is important. How is this prevented? Before and after activity: Warm up and stretch before being active. Cool down and stretch after being active. Give your body time to rest between periods of activity. During activity: Make sure to use equipment that fits you. If you play tennis, put power in your stroke with your lower body. Avoid using your arm only. Maintain physical fitness, including: Strength. Flexibility. Endurance. Do exercises to strengthen the forearm muscles. Contact a health care provider if: You have pain that gets worse or does not get better with treatment. You have numbness or weakness in your forearm, hand, or fingers. Get help right away if: Your pain is severe. You cannot move your wrist. Summary Tennis elbow (lateral epicondylitis) is inflammation of tendons in your outer forearm, near your elbow. Common symptoms include pain and tenderness in your forearm and the outer part of your elbow. This condition is usually caused by repeatedly extending your wrist, turning your forearm, and using your hands. The first treatment is often resting and icing your arm to relieve symptoms. Further treatment may include taking medicine, getting physical therapy, wearing a brace or strap, or having surgery. This information is not intended to replace advice given to you by your health care provider. Make sure you discuss any questions you have with your healthcare provider. Document Revised: 11/26/2019 Document  Reviewed: 11/26/2019 Elsevier Patient Education  [place]. Pap Test Why am I having this test? A Pap test, also called a Pap smear, is a screening test to check for signs of: Cancer of the vagina, cervix, and uterus. The cervix is the lower part of the uterus that opens into the vagina. Infection. Changes that may be a sign that cancer is developing (precancerous changes). Women need this test on a regular basis. In general, you should have a Pap test every 3 years until you reach menopause or age 5. Women aged 30-60 may choose to have their Pap test done at the same time as an HPV (human papillomavirus) test every 5 years (instead of every 3 years). Your health care provider may recommend having Pap tests more or less oftendepending on your medical conditions and past Pap test results. What kind of sample is taken?  Your health care provider will collect a sample of cells from the surface of your cervix. This will be done using a small cotton swab, plastic spatula, or brush. This sample is often collected during a pelvic exam, when you are lying on your back on an exam table with feet in footrests (stirrups). In some cases, fluids (secretions) from the cervix or vagina may also  be collected. How do I prepare for this test? Be aware of where you are in your menstrual cycle. If you are menstruating on the day of the test, you may be asked to reschedule. You may need to reschedule if you have a known vaginal infection on the day of the test. Follow instructions from your health care provider about: Changing or stopping your regular medicines. Some medicines can cause abnormal test results, such as digitalis and tetracycline. Avoiding douching or taking a bath the day before or the day of the test. Tell a health care provider about: Any allergies you have. All medicines you are taking, including vitamins, herbs, eye drops, creams, and over-the-counter medicines. Any blood disorders you  have. Any surgeries you have had. Any medical conditions you have. Whether you are pregnant or may be pregnant. How are the results reported? Your test results will be reported as either abnormal or normal. A false-positive result can occur. A false positive is incorrect because itmeans that a condition is present when it is not. A false-negative result can occur. A false negative is incorrect because itmeans that a condition is not present when it is. What do the results mean? A normal test result means that you do not have signs of cancer of the vagina,cervix, or uterus. An abnormal result may mean that you have: Cancer. A Pap test by itself is not enough to diagnose cancer. You will have more tests done in this case. Precancerous changes in your vagina, cervix, or uterus. Inflammation of the cervix. An STD (sexually transmitted disease). A fungal infection. A parasite infection. Talk with your health care provider about what your results mean. Questions to ask your health care provider Ask your health care provider, or the department that is doing the test: When will my results be ready? How will I get my results? What are my treatment options? What other tests do I need? What are my next steps? Summary In general, women should have a Pap test every 3 years until they reach menopause or age 57. Your health care provider will collect a sample of cells from the surface of your cervix. This will be done using a small cotton swab, plastic spatula, or brush. In some cases, fluids (secretions) from the cervix or vagina may also be collected. This information is not intended to replace advice given to you by your health care provider. Make sure you discuss any questions you have with your healthcare provider. Document Revised: 04/28/2020 Document Reviewed: 01/17/2020 Elsevier Patient Education  [place].
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
## 2021-01-21 DIAGNOSIS — H02534 Eyelid retraction left upper eyelid: Secondary | ICD-10-CM | POA: Diagnosis not present

## 2021-01-21 DIAGNOSIS — H2513 Age-related nuclear cataract, bilateral: Secondary | ICD-10-CM | POA: Diagnosis not present

## 2021-01-21 DIAGNOSIS — H0288A Meibomian gland dysfunction right eye, upper and lower eyelids: Secondary | ICD-10-CM | POA: Diagnosis not present

## 2021-01-21 DIAGNOSIS — H11132 Conjunctival pigmentations, left eye: Secondary | ICD-10-CM | POA: Diagnosis not present

## 2021-01-21 DIAGNOSIS — H0102A Squamous blepharitis right eye, upper and lower eyelids: Secondary | ICD-10-CM | POA: Diagnosis not present

## 2021-01-21 DIAGNOSIS — H0288B Meibomian gland dysfunction left eye, upper and lower eyelids: Secondary | ICD-10-CM | POA: Diagnosis not present

## 2021-01-21 DIAGNOSIS — H0102B Squamous blepharitis left eye, upper and lower eyelids: Secondary | ICD-10-CM | POA: Diagnosis not present

## 2021-01-22 LAB — IGP, RFX APTIMA HPV ASCU

## 2021-01-27 ENCOUNTER — Ambulatory Visit (INDEPENDENT_AMBULATORY_CARE_PROVIDER_SITE_OTHER): Payer: Medicare Other | Admitting: Family Medicine

## 2021-01-27 ENCOUNTER — Other Ambulatory Visit: Payer: Self-pay

## 2021-01-27 ENCOUNTER — Encounter (INDEPENDENT_AMBULATORY_CARE_PROVIDER_SITE_OTHER): Payer: Self-pay | Admitting: Family Medicine

## 2021-01-27 VITALS — BP 118/83 | HR 63 | Temp 97.9°F | Ht 64.0 in | Wt 270.0 lb

## 2021-01-27 DIAGNOSIS — E559 Vitamin D deficiency, unspecified: Secondary | ICD-10-CM | POA: Diagnosis not present

## 2021-01-27 DIAGNOSIS — Z6841 Body Mass Index (BMI) 40.0 and over, adult: Secondary | ICD-10-CM

## 2021-01-27 DIAGNOSIS — R7303 Prediabetes: Secondary | ICD-10-CM

## 2021-01-27 MED ORDER — METFORMIN HCL 500 MG PO TABS: 500.0000 mg | ORAL_TABLET | Freq: Every day | ORAL | 0 refills | Status: DC

## 2021-01-27 NOTE — Progress Notes (Signed)
Chief Complaint:   OBESITY Stacie Daugherty is here to discuss her progress with her obesity treatment plan along with follow-up of her obesity related diagnoses. Stacie Daugherty is on the Category 2 Plan and states she is following her eating plan approximately 30% of the time. Stacie Daugherty states she is at the [facility] doing floor workout and aerobics 4 times per week.  Today's visit was #: 2 Starting weight: 272 lbs Starting date: 01/13/2021 Today's weight: 270 lbs Today's date: 01/27/2021 Total lbs lost to date: 2 Total lbs lost since last in-office visit: 2  Interim History: Stacie Daugherty struggled to follow her eating plan closely. She felt deprived and would like other meal plan options.  Subjective:   1. Pre-diabetes Stacie Daugherty has a new diagnosis of pre-diabetes. Her A1c and fasting insulin are elevated. She is working on diet and exercise. I discussed labs with the patient today.  2. Vitamin D deficiency Stacie Daugherty is on OTC Vit D, and her level is almost at goal. I discussed labs with the patient today.  Assessment/Plan:   1. Pre-diabetes Stacie Daugherty agreed to start metformin 500 mg q AM with no refills. She will continue to work on weight loss, exercise, and decreasing simple carbohydrates to help decrease the risk of diabetes. We will recheck labs in 3 months.  2. Vitamin D deficiency Low Vitamin D level contributes to fatigue and are associated with obesity, breast, and colon cancer. Stacie Daugherty will continue Vitamin D OTC and we will recheck labs in 3 months. She will follow-up for routine testing of Vitamin D, at least 2-3 times per year to avoid over-replacement.  3. Obesity with current BMI 46.3 Stacie Daugherty is currently in the action stage of change. As such, her goal is to continue with weight loss efforts. She has agreed to the Category 2 Plan or keeping a food journal and adhering to recommended goals of 1100-1300 calories and 75+ grams of protein daily.   Exercise goals: As is.  Behavioral modification strategies: increasing  lean protein intake, no skipping meals, meal planning and cooking strategies, and keeping a strict food journal.  Stacie Daugherty has agreed to follow-up with our clinic in 2 weeks. She was informed of the importance of frequent follow-up visits to maximize her success with intensive lifestyle modifications for her multiple health conditions.   Objective:   Blood pressure 118/83, pulse 63, temperature 97.9 F (36.6 C), height '5\' 4"'$  (1.626 m), weight 270 lb (122.5 kg), SpO2 96 %. Body mass index is 46.35 kg/m.  General: Cooperative, alert, well developed, in no acute distress. HEENT: Conjunctivae and lids unremarkable. Cardiovascular: Regular rhythm.  Lungs: Normal work of breathing. Neurologic: No focal deficits.   Lab Results  Component Value Date   CREATININE 0.85 01/13/2021   BUN 17 01/13/2021   NA 143 01/13/2021   K 4.4 01/13/2021   CL 104 01/13/2021   CO2 26 01/13/2021   Lab Results  Component Value Date   ALT 16 01/13/2021   AST 18 01/13/2021   ALKPHOS 55 01/13/2021   BILITOT 0.2 01/13/2021   Lab Results  Component Value Date   HGBA1C 5.9 (H) 01/13/2021   HGBA1C 5.5 01/13/2020   HGBA1C 5.5 01/13/2020   HGBA1C 5.5 (A) 01/13/2020   HGBA1C 5.3 07/12/2016   Lab Results  Component Value Date   INSULIN 14.0 01/13/2021   Lab Results  Component Value Date   TSH 1.260 01/13/2021   Lab Results  Component Value Date   CHOL 186 01/13/2021   HDL 84  01/13/2021   LDLCALC 91 01/13/2021   TRIG 60 01/13/2021   CHOLHDL 2.2 10/14/2020   Lab Results  Component Value Date   VD25OH 43.7 01/13/2021   VD25OH 52.7 07/10/2019   VD25OH 38.2 12/04/2018   Lab Results  Component Value Date   WBC 4.5 01/13/2021   HGB 13.1 01/13/2021   HCT 39.7 01/13/2021   MCV 96 01/13/2021   PLT 241 04/15/2020   Lab Results  Component Value Date   IRON 69 08/07/2014   TIBC 288 08/07/2014   FERRITIN 56 08/07/2014   Attestation Statements:   Reviewed by clinician on day of visit: allergies,  medications, problem list, medical history, surgical history, family history, social history, and previous encounter notes.  Time spent on visit including pre-visit chart review and post-visit care and charting was 42 minutes.    I, Trixie Dredge, am acting as transcriptionist for Dennard Nip, MD.  I have reviewed the above documentation for accuracy and completeness, and I agree with the above. -  Dennard Nip, MD

## 2021-02-09 ENCOUNTER — Ambulatory Visit (INDEPENDENT_AMBULATORY_CARE_PROVIDER_SITE_OTHER): Payer: Medicare Other | Admitting: Bariatrics

## 2021-02-10 ENCOUNTER — Encounter (INDEPENDENT_AMBULATORY_CARE_PROVIDER_SITE_OTHER): Payer: Self-pay | Admitting: Bariatrics

## 2021-02-10 ENCOUNTER — Ambulatory Visit (INDEPENDENT_AMBULATORY_CARE_PROVIDER_SITE_OTHER): Payer: Medicare Other | Admitting: Bariatrics

## 2021-02-10 ENCOUNTER — Ambulatory Visit (INDEPENDENT_AMBULATORY_CARE_PROVIDER_SITE_OTHER): Payer: Medicare Other | Admitting: Physician Assistant

## 2021-02-10 ENCOUNTER — Other Ambulatory Visit: Payer: Self-pay

## 2021-02-10 VITALS — BP 137/84 | HR 71 | Temp 97.9°F | Ht 62.0 in | Wt 269.0 lb

## 2021-02-10 DIAGNOSIS — R7303 Prediabetes: Secondary | ICD-10-CM | POA: Diagnosis not present

## 2021-02-10 DIAGNOSIS — E7849 Other hyperlipidemia: Secondary | ICD-10-CM | POA: Diagnosis not present

## 2021-02-10 DIAGNOSIS — I10 Essential (primary) hypertension: Secondary | ICD-10-CM | POA: Diagnosis not present

## 2021-02-10 DIAGNOSIS — Z6841 Body Mass Index (BMI) 40.0 and over, adult: Secondary | ICD-10-CM | POA: Diagnosis not present

## 2021-02-10 NOTE — Progress Notes (Signed)
Chief Complaint:   OBESITY Stacie Daugherty is here to discuss her progress with her obesity treatment plan along with follow-up of her obesity related diagnoses. Stacie Daugherty is on the Category 2 Plan and states she is following her eating plan approximately 20% of the time. Stacie Daugherty states she is doing water aerobics for 2 hours 2 times per week and floor exercise for 1 hour 2 times per week.  Today's visit was #: 3 Starting weight: 272 lbs Starting date: 01/13/2021 Today's weight: 269 lbs Today's date: 02/10/2021 Total lbs lost to date: 3 lbs Total lbs lost since last in-office visit: 1 lb  Interim History: Stacie Daugherty is down 1 lb since she has not been  following her plan to any extent. She states that she eats out quite a bit (like french fries).  Subjective:   1. Essential hypertension Stacie Daugherty is currently taking HCTZ and Cozaar. Her hypertension is controlled.  2. Other hyperlipidemia Stacie Daugherty is currently taking Lipitor.  3. Pre-diabetes Stacie Daugherty was prescribed Metformin, but not taking do to issues.  Assessment/Plan:   1. Essential hypertension Stacie Daugherty will continue medications. She is working on healthy weight loss and exercise to improve blood pressure control. We will watch for signs of hypotension as she continues her lifestyle modifications.  2. Other hyperlipidemia Cardiovascular risk and specific lipid/LDL goals reviewed.  We discussed several lifestyle modifications today and Stacie Daugherty will continue to work on diet, exercise and weight loss efforts. Stacie Daugherty will continue Lipitor. Orders and follow up as documented in patient record.   Counseling Intensive lifestyle modifications are the first line treatment for this issue. Dietary changes: Increase soluble fiber. Decrease simple carbohydrates. Exercise changes: Moderate to vigorous-intensity aerobic activity 150 minutes per week if tolerated. Lipid-lowering medications: see documented in medical record.   3. Pre-diabetes Stacie Daugherty will consider taking  Metformin. She will decrease carbohydrates and increase healthy fats and protein.  She will continue to work on weight loss, exercise, and decreasing simple carbohydrates to help decrease the risk of diabetes.    4. Obesity with current BMI 49.3 Stacie Daugherty is currently in the action stage of change. As such, her goal is to continue with weight loss efforts. She has agreed to the Category 2 Plan.   Stacie Daugherty will continue to adhere closely to the plan. She will continue to meal plan. Microwave meal sheet was given out today as well as Eating Out sheet. She will continue with adequate water.   Exercise goals:  As is. Stacie Daugherty will continue going to the gym.  Behavioral modification strategies: increasing lean protein intake, decreasing simple carbohydrates, increasing vegetables, increasing water intake, decreasing eating out, no skipping meals, meal planning and cooking strategies, keeping healthy foods in the home, and planning for success.  Stacie Daugherty has agreed to follow-up with our clinic in 3 weeks with Stacie Marble, NP or Stacie Daugherty, Stacie Daugherty. She was informed of the importance of frequent follow-up visits to maximize her success with intensive lifestyle modifications for her multiple health conditions.   Objective:   Blood pressure 137/84, pulse 71, temperature 97.9 F (36.6 C), height '5\' 2"'$  (1.575 m), weight 269 lb (122 kg), SpO2 95 %. Body mass index is 49.2 kg/m.  General: Cooperative, alert, well developed, in no acute distress. HEENT: Conjunctivae and lids unremarkable. Cardiovascular: Regular rhythm.  Lungs: Normal work of breathing. Neurologic: No focal deficits.   Lab Results  Component Value Date   CREATININE 0.85 01/13/2021   BUN 17 01/13/2021   NA 143 01/13/2021   K  4.4 01/13/2021   CL 104 01/13/2021   CO2 26 01/13/2021   Lab Results  Component Value Date   ALT 16 01/13/2021   AST 18 01/13/2021   ALKPHOS 55 01/13/2021   BILITOT 0.2 01/13/2021   Lab Results  Component Value Date    HGBA1C 5.9 (H) 01/13/2021   HGBA1C 5.5 01/13/2020   HGBA1C 5.5 01/13/2020   HGBA1C 5.5 (A) 01/13/2020   HGBA1C 5.3 07/12/2016   Lab Results  Component Value Date   INSULIN 14.0 01/13/2021   Lab Results  Component Value Date   TSH 1.260 01/13/2021   Lab Results  Component Value Date   CHOL 186 01/13/2021   HDL 84 01/13/2021   LDLCALC 91 01/13/2021   TRIG 60 01/13/2021   CHOLHDL 2.2 10/14/2020   Lab Results  Component Value Date   VD25OH 43.7 01/13/2021   VD25OH 52.7 07/10/2019   VD25OH 38.2 12/04/2018   Lab Results  Component Value Date   WBC 4.5 01/13/2021   HGB 13.1 01/13/2021   HCT 39.7 01/13/2021   MCV 96 01/13/2021   PLT 241 04/15/2020   Lab Results  Component Value Date   IRON 69 08/07/2014   TIBC 288 08/07/2014   FERRITIN 56 08/07/2014    Obesity Behavioral Intervention:   Approximately 15 minutes were spent on the discussion below.  ASK: We discussed the diagnosis of obesity with Stacie Daugherty today and Stacie Daugherty agreed to give [place] permission to discuss obesity behavioral modification therapy today.  ASSESS: Stacie Daugherty has the diagnosis of obesity and her BMI today is 49.3. Stacie Daugherty is in the action stage of change.   ADVISE: Stacie Daugherty was educated on the multiple health risks of obesity as well as the benefit of weight loss to improve her health. She was advised of the need for long term treatment and the importance of lifestyle modifications to improve her current health and to decrease her risk of future health problems.  AGREE: Multiple dietary modification options and treatment options were discussed and Stacie Daugherty agreed to follow the recommendations documented in the above note.  ARRANGE: Stacie Daugherty was educated on the importance of frequent visits to treat obesity as outlined per CMS and USPSTF guidelines and agreed to schedule her next follow up appointment today.  Attestation Statements:   Reviewed by clinician on day of visit: allergies, medications, problem list, medical  history, surgical history, family history, social history, and previous encounter notes.  I, Lizbeth Bark, RMA, am acting as Location manager for [employer], DO.   I have reviewed the above documentation for accuracy and completeness, and I agree with the above. Jearld Lesch, DO

## 2021-02-11 ENCOUNTER — Encounter (INDEPENDENT_AMBULATORY_CARE_PROVIDER_SITE_OTHER): Payer: Self-pay | Admitting: Bariatrics

## 2021-02-18 ENCOUNTER — Other Ambulatory Visit: Payer: Self-pay | Admitting: Nurse Practitioner

## 2021-02-18 DIAGNOSIS — E7849 Other hyperlipidemia: Secondary | ICD-10-CM

## 2021-02-24 ENCOUNTER — Telehealth (INDEPENDENT_AMBULATORY_CARE_PROVIDER_SITE_OTHER): Payer: Self-pay | Admitting: Bariatrics

## 2021-02-24 NOTE — Telephone Encounter (Signed)
Patient says Metformin is upsetting her stomach. Last seen by Dr. Owens Shark.

## 2021-02-25 ENCOUNTER — Other Ambulatory Visit (INDEPENDENT_AMBULATORY_CARE_PROVIDER_SITE_OTHER): Payer: Self-pay | Admitting: Family Medicine

## 2021-02-25 DIAGNOSIS — R7303 Prediabetes: Secondary | ICD-10-CM

## 2021-02-25 NOTE — Telephone Encounter (Signed)
Pt is going to stop taking Metformin, disregard the refill request please.

## 2021-02-25 NOTE — Telephone Encounter (Signed)
Last OV with Dr Brown 

## 2021-02-25 NOTE — Telephone Encounter (Signed)
Please review

## 2021-02-25 NOTE — Telephone Encounter (Signed)
LAST APPOINTMENT DATE: 02/10/21 NEXT APPOINTMENT DATE: 03/03/21   [id number] - [place], [place] - [address] [address] Phone: [phone] Fax: [phone]  Patient is requesting a refill of the following medications: Pending Prescriptions:                       Disp   Refills   metFORMIN (GLUCOPHAGE) 500 MG tablet [Phar*30 tab*0       Sig: TAKE 1 TABLET BY MOUTH EVERY DAY WITH BREAKFAST   Date last filled: 01/27/21 Previously prescribed by Dr. Leafy Ro  Lab Results      Component                Value               Date                      HGBA1C                   5.9 (H)             01/13/2021                HGBA1C                   5.5                 01/13/2020                HGBA1C                   5.5                 01/13/2020                HGBA1C                   5.5 (A)             01/13/2020           Lab Results      Component                Value               Date                      MICROALBUR               1.13                06/24/2010                LDLCALC                  91                  01/13/2021                CREATININE               0.85                01/13/2021           Lab Results      Component                Value  Date                      VD25OH                   43.7                01/13/2021                VD25OH                   52.7                07/10/2019                VD25OH                   38.2                12/04/2018            BP Readings from Last 3 Encounters: 02/10/21 : 137/84 01/27/21 : 118/83 01/20/21 : (!) 150/88

## 2021-03-03 ENCOUNTER — Ambulatory Visit (INDEPENDENT_AMBULATORY_CARE_PROVIDER_SITE_OTHER): Payer: Medicare Other | Admitting: Family Medicine

## 2021-03-23 ENCOUNTER — Other Ambulatory Visit (INDEPENDENT_AMBULATORY_CARE_PROVIDER_SITE_OTHER): Payer: Self-pay | Admitting: Bariatrics

## 2021-03-23 DIAGNOSIS — R7303 Prediabetes: Secondary | ICD-10-CM

## 2021-03-23 NOTE — Telephone Encounter (Signed)
Dr.Brown 

## 2021-04-28 ENCOUNTER — Other Ambulatory Visit: Payer: Self-pay

## 2021-04-28 ENCOUNTER — Encounter: Payer: Self-pay | Admitting: Nurse Practitioner

## 2021-04-28 ENCOUNTER — Ambulatory Visit (INDEPENDENT_AMBULATORY_CARE_PROVIDER_SITE_OTHER): Payer: Medicare Other | Admitting: Nurse Practitioner

## 2021-04-28 VITALS — BP 138/86 | HR 75 | Temp 97.9°F | Ht 64.0 in | Wt 271.0 lb

## 2021-04-28 DIAGNOSIS — R7303 Prediabetes: Secondary | ICD-10-CM | POA: Diagnosis not present

## 2021-04-28 DIAGNOSIS — R350 Frequency of micturition: Secondary | ICD-10-CM

## 2021-04-28 DIAGNOSIS — R82998 Other abnormal findings in urine: Secondary | ICD-10-CM

## 2021-04-28 DIAGNOSIS — I10 Essential (primary) hypertension: Secondary | ICD-10-CM | POA: Diagnosis not present

## 2021-04-28 DIAGNOSIS — Z6841 Body Mass Index (BMI) 40.0 and over, adult: Secondary | ICD-10-CM

## 2021-04-28 LAB — POCT URINALYSIS DIP (CLINITEK)
Blood, UA: NEGATIVE
Glucose, UA: NEGATIVE mg/dL
Ketones, POC UA: NEGATIVE mg/dL
Nitrite, UA: NEGATIVE
Spec Grav, UA: 1.025 (ref 1.010–1.025)
Urobilinogen, UA: 1 E.U./dL
pH, UA: 6.5 (ref 5.0–8.0)

## 2021-04-28 LAB — POCT GLYCOSYLATED HEMOGLOBIN (HGB A1C)
HbA1c POC (<> result, manual entry): 5.7 % (ref 4.0–5.6)
HbA1c, POC (controlled diabetic range): 5.7 % (ref 0.0–7.0)
HbA1c, POC (prediabetic range): 5.7 % (ref 5.7–6.4)
Hemoglobin A1C: 5.7 % — AB (ref 4.0–5.6)
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
## 2021-04-28 NOTE — Patient Instructions (Signed)

## 2021-04-30 ENCOUNTER — Encounter: Payer: Self-pay | Admitting: Nurse Practitioner

## 2021-05-01 LAB — URINE CULTURE

## 2021-06-28 DIAGNOSIS — E559 Vitamin D deficiency, unspecified: Secondary | ICD-10-CM | POA: Diagnosis not present

## 2021-06-28 DIAGNOSIS — Z79899 Other long term (current) drug therapy: Secondary | ICD-10-CM | POA: Diagnosis not present

## 2021-07-28 ENCOUNTER — Encounter: Payer: Self-pay | Admitting: Nurse Practitioner

## 2021-07-28 ENCOUNTER — Ambulatory Visit (INDEPENDENT_AMBULATORY_CARE_PROVIDER_SITE_OTHER): Payer: Medicare Other | Admitting: Nurse Practitioner

## 2021-07-28 ENCOUNTER — Other Ambulatory Visit: Payer: Self-pay

## 2021-07-28 VITALS — BP 135/83 | HR 78 | Temp 98.1°F | Ht 64.0 in | Wt 274.6 lb

## 2021-07-28 DIAGNOSIS — R7303 Prediabetes: Secondary | ICD-10-CM | POA: Diagnosis not present

## 2021-07-28 DIAGNOSIS — E7800 Pure hypercholesterolemia, unspecified: Secondary | ICD-10-CM | POA: Diagnosis not present

## 2021-07-28 DIAGNOSIS — I10 Essential (primary) hypertension: Secondary | ICD-10-CM | POA: Diagnosis not present

## 2021-07-28 DIAGNOSIS — Z6841 Body Mass Index (BMI) 40.0 and over, adult: Secondary | ICD-10-CM
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
## 2021-07-29 LAB — COMP. METABOLIC PANEL (12)
AST: 17 IU/L (ref 0–40)
Albumin/Globulin Ratio: 1.5 (ref 1.2–2.2)
Albumin: 4.5 g/dL (ref 3.8–4.9)
Alkaline Phosphatase: 54 IU/L (ref 44–121)
BUN/Creatinine Ratio: 16 (ref 9–23)
BUN: 12 mg/dL (ref 6–24)
Bilirubin Total: 0.2 mg/dL (ref 0.0–1.2)
Calcium: 9.9 mg/dL (ref 8.7–10.2)
Chloride: 99 mmol/L (ref 96–106)
Creatinine, Ser: 0.77 mg/dL (ref 0.57–1.00)
Globulin, Total: 3 g/dL (ref 1.5–4.5)
Glucose: 90 mg/dL (ref 70–99)
Potassium: 4 mmol/L (ref 3.5–5.2)
Sodium: 141 mmol/L (ref 134–144)
Total Protein: 7.5 g/dL (ref 6.0–8.5)
eGFR: 89 mL/min/{1.73_m2} (ref >59)

## 2021-07-29 LAB — LIPID PANEL
Chol/HDL Ratio: 2.4 ratio (ref 0.0–4.4)
Cholesterol, Total: 194 mg/dL (ref 100–199)
HDL: 81 mg/dL (ref >39)
LDL Chol Calc (NIH): 98 mg/dL (ref 0–99)
Triglycerides: 84 mg/dL (ref 0–149)
VLDL Cholesterol Cal: 15 mg/dL (ref 5–40)

## 2021-08-19 ENCOUNTER — Telehealth: Payer: Self-pay | Admitting: Sports Medicine

## 2021-08-19 NOTE — Telephone Encounter (Signed)
Pt called and received the letter today that you are leaving and wanted to thank you for your care and appreciated your kindness. ?

## 2021-08-26 DIAGNOSIS — I509 Heart failure, unspecified: Secondary | ICD-10-CM | POA: Diagnosis not present

## 2021-08-26 DIAGNOSIS — E119 Type 2 diabetes mellitus without complications: Secondary | ICD-10-CM | POA: Diagnosis not present

## 2021-08-26 DIAGNOSIS — Z0000 Encounter for general adult medical examination without abnormal findings: Secondary | ICD-10-CM | POA: Diagnosis not present

## 2021-08-26 DIAGNOSIS — G4730 Sleep apnea, unspecified: Secondary | ICD-10-CM | POA: Diagnosis not present

## 2021-08-26 DIAGNOSIS — Z79899 Other long term (current) drug therapy: Secondary | ICD-10-CM | POA: Diagnosis not present

## 2021-08-26 DIAGNOSIS — I119 Hypertensive heart disease without heart failure: Secondary | ICD-10-CM | POA: Diagnosis not present

## 2021-09-17 ENCOUNTER — Other Ambulatory Visit: Payer: Self-pay | Admitting: Nurse Practitioner

## 2021-09-24 ENCOUNTER — Other Ambulatory Visit (HOSPITAL_COMMUNITY): Payer: Self-pay | Admitting: Family Medicine

## 2021-09-24 DIAGNOSIS — I509 Heart failure, unspecified: Secondary | ICD-10-CM

## 2021-10-06 ENCOUNTER — Ambulatory Visit (HOSPITAL_COMMUNITY): Payer: Medicare Other | Attending: Cardiology

## 2021-10-06 ENCOUNTER — Ambulatory Visit
Admission: RE | Admit: 2021-10-06 | Discharge: 2021-10-06 | Disposition: A | Discharge status: Home / Self Care | Payer: Medicare Other | Source: Ambulatory Visit | Attending: Family Medicine | Admitting: Family Medicine

## 2021-10-06 ENCOUNTER — Other Ambulatory Visit: Payer: Self-pay | Admitting: Family Medicine

## 2021-10-06 DIAGNOSIS — R053 Chronic cough: Secondary | ICD-10-CM

## 2021-10-06 DIAGNOSIS — I509 Heart failure, unspecified: Secondary | ICD-10-CM

## 2021-10-06 LAB — ECHOCARDIOGRAM COMPLETE
Area-P 1/2: 4.01 cm2
S' Lateral: 2.7 cm

## 2021-10-20 ENCOUNTER — Ambulatory Visit (INDEPENDENT_AMBULATORY_CARE_PROVIDER_SITE_OTHER): Payer: Medicare Other | Admitting: Internal Medicine

## 2021-10-20 VITALS — BP 136/80 | HR 67 | Ht 63.0 in | Wt 274.4 lb

## 2021-10-20 DIAGNOSIS — E785 Hyperlipidemia, unspecified: Secondary | ICD-10-CM

## 2021-10-20 DIAGNOSIS — I10 Essential (primary) hypertension: Secondary | ICD-10-CM | POA: Diagnosis not present

## 2021-10-20 DIAGNOSIS — R0609 Other forms of dyspnea: Secondary | ICD-10-CM | POA: Diagnosis not present

## 2021-10-20 DIAGNOSIS — R600 Localized edema: Secondary | ICD-10-CM

## 2021-10-20 NOTE — Progress Notes (Unsigned)
Cardiology Office Note:    Date:  10/20/2021   ID:  Stacie Daugherty, DOB [birth year], MRN [id number]  PCP:  Fenton Foy, NP  Cardiologist:  Buford Dresser, MD  Referring MD: Vevelyn Francois, NP   CC: follow up  History of Present Illness:    Stacie Daugherty is a 61 y.o. female with a hx of hypertension, hyperlipidemia, obesity, osteoarthritis, bipolar disorder, depression, anxiety who is seen for follow up. I initially met her 03/25/19  as a new consult at the request of Vevelyn Francois, NP for the evaluation and management of hypertension, indication for stress test.  Today: Had been working out with a trainer, but this has been on hold the last 4 weeks due to an illness in the trainer's family. Had been going well prior to that. Was doing elliptical and treadmill for 15-20 minutes and then did weights. Working on improving diet, eating salads but still gets takeout occasionally.   Chronic mild leg edema is unchanged. Has occasional pain in her upper thighs at night or crossing her legs. Not with activity.   Denies chest pain, shortness of breath at rest or with normal exertion. Is hard to exercise with mask on but  She copes ok with this.  No PND, orthopnea, new LE edema or unexpected weight gain. No syncope or palpitations.  Past Medical History:  Diagnosis Date   Allergy    mild   Anxiety    bipolar    Arthritis    both knees   Bipolar 1 disorder ([place])    Hospitalized multiple times for bipolar disorder ([place] - [facility], [facility], and [facility], most recently in [place])   Depression    bipolar   Edema of both lower extremities    Hidradenitis suppurativa    Hyperlipidemia    Hypertension    Joint pain    Lactose intolerance    Obesity    Osteoarthritis    SOB (shortness of breath)    Vitamin B 12 deficiency    Vitamin D deficiency     Past Surgical History:  Procedure Laterality Date   BREAST BIOPSY  Left 2012   benign   COLOSTOMY  07/06/2015   INDUCED ABORTION     in patient's 9s in [place].   WISDOM TOOTH EXTRACTION      Current Medications: Current Outpatient Medications on File Prior to Visit  Medication Sig   ARIPiprazole (ABILIFY) 15 MG tablet Take 15 mg by mouth at bedtime.   atorvastatin (LIPITOR) 20 MG tablet TAKE 1 TABLET BY MOUTH EVERY DAY   carbamazepine (TEGRETOL) 200 MG tablet Take 1 tablet (200 mg total) by mouth 2 (two) times daily at 8 am and 10 pm.   carvedilol (COREG) 3.125 MG tablet TAKE 1 TABLET (3.125 MG TOTAL) BY MOUTH DAILY.   Cholecalciferol (VITAMIN D3) 25 MCG (1000 UT) CAPS Take 1,000 Units by mouth daily.   DENTA 5000 PLUS 1.1 % CREA dental cream Take by mouth.   divalproex (DEPAKOTE) 500 MG DR tablet Take 500 mg by mouth 2 (two) times daily.   FLUZONE QUADRIVALENT 0.5 ML injection    gabapentin (NEURONTIN) 300 MG capsule Take 1 capsule (300 mg total) by mouth 3 (three) times daily at 8am, 2pm and bedtime. For agitation   losartan (COZAAR) 50 MG tablet TAKE 1 TABLET BY MOUTH EVERY DAY   MELATONIN PO Take 1 tablet by mouth at bedtime.   Multiple Vitamin (MULTIVITAMIN PO)  Take by mouth.   vitamin C (ASCORBIC ACID) 500 MG tablet Take 500 mg by mouth daily.   hydrochlorothiazide (HYDRODIURIL) 25 MG tablet Take 1 tablet (25 mg total) by mouth daily.   No current facility-administered medications on file prior to visit.     Allergies:   Haldol [haloperidol lactate]   Social History   Tobacco Use   Smoking status: Former    Types: Cigarettes   Smokeless tobacco: Never   Tobacco comments:    Smoked few cigarettes, socially, "did not inhale"  Vaping Use   Vaping Use: Never used  Substance Use Topics   Alcohol use: No    Comment: Social alcohol use when younger   Drug use: No    Comment: Social marijuana use when younger    Family History: family history includes Anxiety disorder in her mother; Bipolar disorder in her mother; Cancer in  her father; Colon cancer in her maternal grandfather; Depression in her mother; Heart disease in an other family member; Hypertension in her maternal grandfather and paternal grandmother; Pulmonary embolism in her mother. There is no history of Colon polyps, Stomach cancer, Rectal cancer, Breast cancer, or Esophageal cancer. mom had chronic heart failure, had pulmonary issues/PE. Grandmother died age 46, unclear if heart disease or old age. No early cardiac death, no MI that she knows of. Brother (who is physician) had mild stroke.   ROS:   Please see the history of present illness.  Additional pertinent ROS otherwise unremarkable.   EKGs/Labs/Other Studies Reviewed:    The following studies were reviewed today: ETT 05-13-2019 Blood pressure demonstrated a hypertensive response to exercise. There was no ST segment deviation noted during stress. No T wave inversion was noted during stress.   Hypertensive response to exercise. There is 1 mm ST depression in V3-V6 at baseline that "pseudonormalizes" with exercise. Suspect changes may be related to left ventricular hypertrophy, but due to baseline abnormalities cannot exclude coronary insufficiency. Further evaluation with a more specific (imaging) method may be indicated.  Echo 2016 - Left ventricle: The cavity size was normal. There was mild    concentric hypertrophy. Systolic function was vigorous. The    estimated ejection fraction was in the range of 65% to 70%. There    was dynamic obstruction in the outflow tract, with a peak    velocity of 148 cm/sec and a peak gradient of 9 mm Hg. Wall    motion was normal; there were no regional wall motion    abnormalities.  - Mitral valve: There was mild regurgitation.  - Left atrium: The atrium was mildly dilated.  - Pulmonary arteries: Systolic pressure was moderately increased.    PA peak pressure: 47 mm Hg (S).   EKG:  NSR, nonspec T wave abnl.  Recent Labs: 01/13/2021: ALT 16; Hemoglobin  13.1; TSH 1.260 07/28/2021: BUN 12; Creatinine, Ser 0.77; Potassium 4.0; Sodium 141  Recent Lipid Panel    Component Value Date/Time   CHOL 194 07/28/2021 1230   TRIG 84 07/28/2021 1230   HDL 81 07/28/2021 1230   CHOLHDL 2.4 07/28/2021 1230   CHOLHDL 2.0 03/31/2017 0904   VLDL 26 07/12/2016 0855   LDLCALC 98 07/28/2021 1230   LDLCALC 64 03/31/2017 0904    Physical Exam:    VS:  BP 136/80   Pulse 67   Ht $R'5\' 3"'vK$  (1.6 m)   Wt 274 lb 6.4 oz (124.5 kg)   SpO2 96%   BMI 48.61 kg/m     Wt  Readings from Last 3 Encounters:  10/20/21 274 lb 6.4 oz (124.5 kg)  07/28/21 274 lb 9.6 oz (124.6 kg)  04/28/21 271 lb (122.9 kg)    GEN: Well nourished, well developed in no acute distress HEENT: Normal, moist mucous membranes NECK: No JVD CARDIAC: regular rhythm, normal S1 and S2, no rubs or gallops. No murmur. VASCULAR: Radial and DP pulses 2+ bilaterally. No carotid bruits RESPIRATORY:  Clear to auscultation without rales, wheezing or rhonchi  ABDOMEN: Soft, non-tender, non-distended MUSCULOSKELETAL:  Ambulates independently SKIN: Warm and dry, no pitting edema but mild nonpitting edema at ankles NEUROLOGIC:  Alert and oriented x 3. No focal neuro deficits noted. PSYCHIATRIC:  Normal affect   ASSESSMENT:    No diagnosis found.  PLAN:    Poor exercise tolerance: working on this with a trainer, had been much improved. No chest pain.  Hypercholesterolemia:  -reviewed lipids from 07/10/19. HDL 91, LDL 98, TG 84 -continue atorvastatin 20 mg daily.   Hypertension: -continue amlodipine 10 mg daily, carvedilol 3.125 mg BID.  Obesity, class 3: BMI 47. Working on weight loss with diet and exercise  Cardiac risk counseling and prevention recommendations: -recommend heart healthy/Mediterranean diet, with whole grains, fruits, vegetable, fish, lean meats, nuts, and olive oil. Limit salt. -recommend moderate walking, 3-5 times/week for 30-50 minutes each session. Aim for at least 150  minutes.week. Goal should be pace of 3 miles/hours, or walking 1.5 miles in 30 minutes -recommend avoidance of tobacco products. Avoid excess alcohol. -Additional risk factor control:  -Diabetes risk: A1c is 5.3, monitor -ASCVD risk score: The 10-year ASCVD risk score (Arnett DK, et al., 2019) is: 5.8%   Values used to calculate the score:     Age: 43 years     Sex: Female     Is Non-Hispanic African American: Yes     Diabetic: No     Tobacco smoker: No     Systolic Blood Pressure: 096 mmHg     Is BP treated: Yes     HDL Cholesterol: 81 mg/dL     Total Cholesterol: 194 mg/dL    Plan for follow up: every 2 years or sooner as needed.  Medication Adjustments/Labs and Tests Ordered: Current medicines are reviewed at length with the patient today.  Concerns regarding medicines are outlined above.  No orders of the defined types were placed in this encounter.  No orders of the defined types were placed in this encounter.   There are no Patient Instructions on file for this visit.  Signed, Buford Dresser, MD PhD 10/20/2021 3:38 PM    [place]

## 2021-10-20 NOTE — Patient Instructions (Signed)
Medication Instructions:  No Changes In Medications at this time.  *If you need a refill on your cardiac medications before your next appointment, please call your pharmacy*  Follow-Up: At [facility], you and your health needs are our priority.  As part of our continuing mission to provide you with exceptional heart care, we have created designated Provider Care Teams.  These Care Teams include your primary Cardiologist (physician) and Advanced Practice Providers (APPs -  Physician Assistants and Nurse Practitioners) who all work together to provide you with the care you need, when you need it.  We recommend signing up for the patient portal called "MyChart".  Sign up information is provided on this After Visit Summary.  MyChart is used to connect with patients for Virtual Visits (Telemedicine).  Patients are able to view lab/test results, encounter notes, upcoming appointments, etc.  Non-urgent messages can be sent to your provider as well.   To learn more about what you can do with MyChart, go to [email or web address].    Your next appointment:   1 year(s)  The format for your next appointment:   In Person  Provider:   Elouise Munroe, MD

## 2021-11-03 ENCOUNTER — Ambulatory Visit: Payer: Medicare Other | Admitting: Nurse Practitioner

## 2021-11-25 ENCOUNTER — Other Ambulatory Visit (HOSPITAL_BASED_OUTPATIENT_CLINIC_OR_DEPARTMENT_OTHER): Payer: Self-pay

## 2021-11-25 DIAGNOSIS — G4730 Sleep apnea, unspecified: Secondary | ICD-10-CM

## 2021-11-26 ENCOUNTER — Other Ambulatory Visit: Payer: Self-pay | Admitting: Family Medicine

## 2021-11-26 DIAGNOSIS — Z1231 Encounter for screening mammogram for malignant neoplasm of breast: Secondary | ICD-10-CM

## 2021-12-13 ENCOUNTER — Other Ambulatory Visit: Payer: Self-pay | Admitting: Nurse Practitioner

## 2021-12-13 DIAGNOSIS — I10 Essential (primary) hypertension: Secondary | ICD-10-CM

## 2021-12-27 ENCOUNTER — Telehealth: Payer: Self-pay | Admitting: Internal Medicine

## 2021-12-27 NOTE — Telephone Encounter (Signed)
Pt c/o medication issue:  1. Name of Medication: atorvastatin (LIPITOR) 40 MG tablet  2. How are you currently taking this medication (dosage and times per day)? TAKE 1 TABLET BY MOUTH EVERY DAY  3. Are you having a reaction (difficulty breathing--STAT)? no  4. What is your medication issue? Patient called to say that her PCP increased her cholesterol medication to '40mg'$ .  She states this was discussed to possibly increased as the last office visit with Dr. Margaretann Loveless.  She has been on it for the past month.   She also wants to point out she is on diabetic.

## 2021-12-27 NOTE — Telephone Encounter (Signed)
Attempted to call patient, left message for patient to call back to office.   

## 2021-12-27 NOTE — Telephone Encounter (Signed)
Returned call to patient who states that Dr. Margaretann Loveless discussed increasing her Atorvastatin but states she declined at that point. Patient states that she recently saw her PCP who increased her Atorvastatin to from '20mg'$  to '40mg'$ . Patient would like to know if this is what Dr. Margaretann Loveless would have done. Advised patient that typically dose increases are from '20mg'$ -'40mg'$  of atorvastatin. Went over side effects of medication as well as LDL goal and how this was determined. Patient states she just wanted to make Dr. Margaretann Loveless aware and see if she had any other recommendations. Advised patient I wold forward message to her. Patient verbalized understanding.

## 2021-12-27 NOTE — Telephone Encounter (Signed)
Patient returning call.

## 2022-01-03 ENCOUNTER — Ambulatory Visit
Admission: RE | Admit: 2022-01-03 | Discharge: 2022-01-03 | Disposition: A | Discharge status: Home / Self Care | Payer: Medicare Other | Source: Ambulatory Visit | Attending: Family Medicine | Admitting: Family Medicine

## 2022-01-03 DIAGNOSIS — Z1231 Encounter for screening mammogram for malignant neoplasm of breast: Secondary | ICD-10-CM

## 2022-01-03 NOTE — Telephone Encounter (Addendum)
Attempted to call patient but unable to reach. Left voicemail with Dr. Delphina Cahill recommendations (Okay per DPR). Advised to call back with concerns, call back number left.   Elouise Munroe, MD  You 7 days ago    Agree with this medication change. No other recs.  [place]

## 2022-01-23 ENCOUNTER — Other Ambulatory Visit: Payer: Self-pay | Admitting: Nurse Practitioner

## 2022-01-23 DIAGNOSIS — E7849 Other hyperlipidemia: Secondary | ICD-10-CM

## 2022-02-02 IMAGING — MG MM DIGITAL SCREENING BILAT W/ TOMO AND CAD
6 of 10 series · 6 of 30 positions shown · non-contrast
Comparison: Previous exam(s).

CLINICAL DATA: Screening.

EXAM:
DIGITAL SCREENING BILATERAL MAMMOGRAM WITH TOMOSYNTHESIS AND CAD
TECHNIQUE: Bilateral screening digital craniocaudal and mediolateral oblique
mammograms were obtained. Bilateral screening digital breast
tomosynthesis was performed. The images were evaluated with
computer-aided detection.

[L MLO synth-2D]
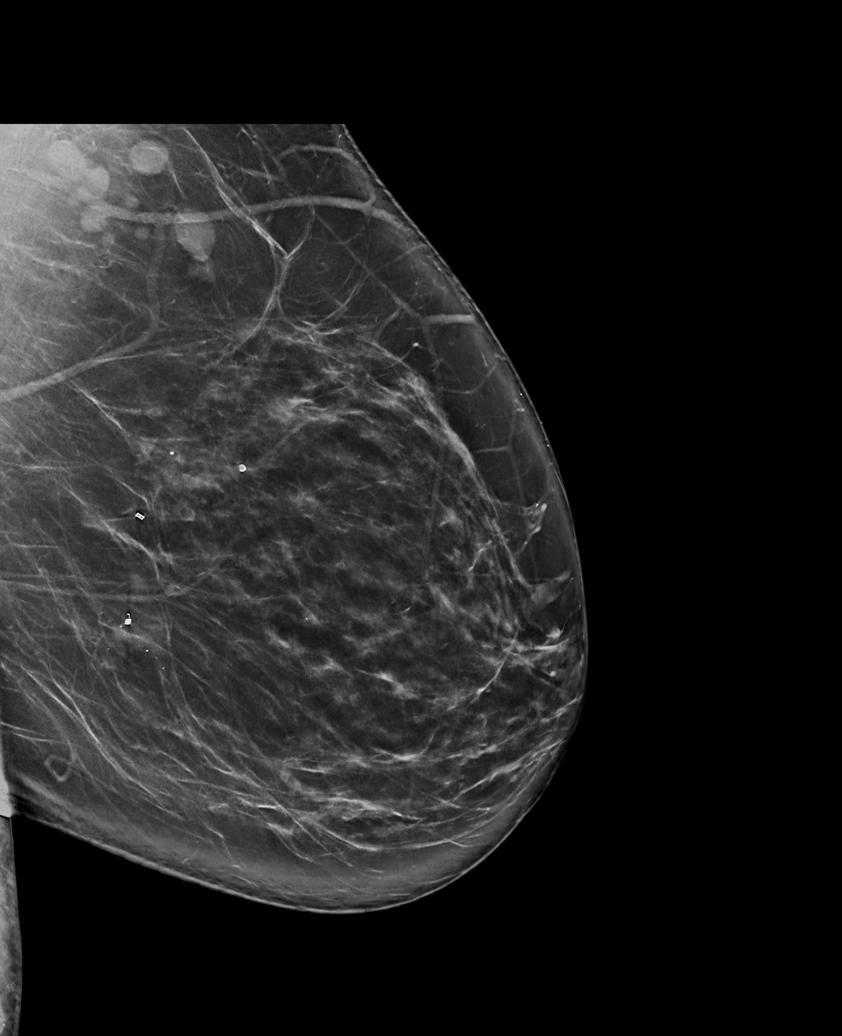

[L CC synth-2D (1 of 2)]
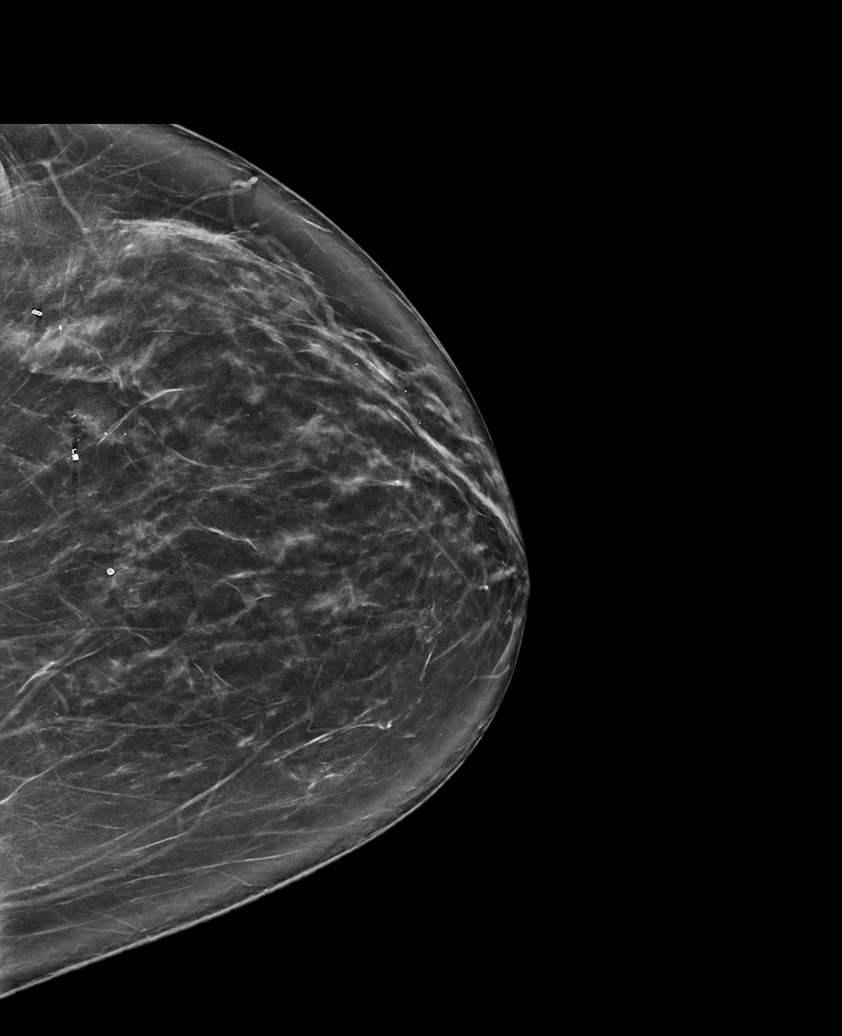

[L CC synth-2D (2 of 2)]
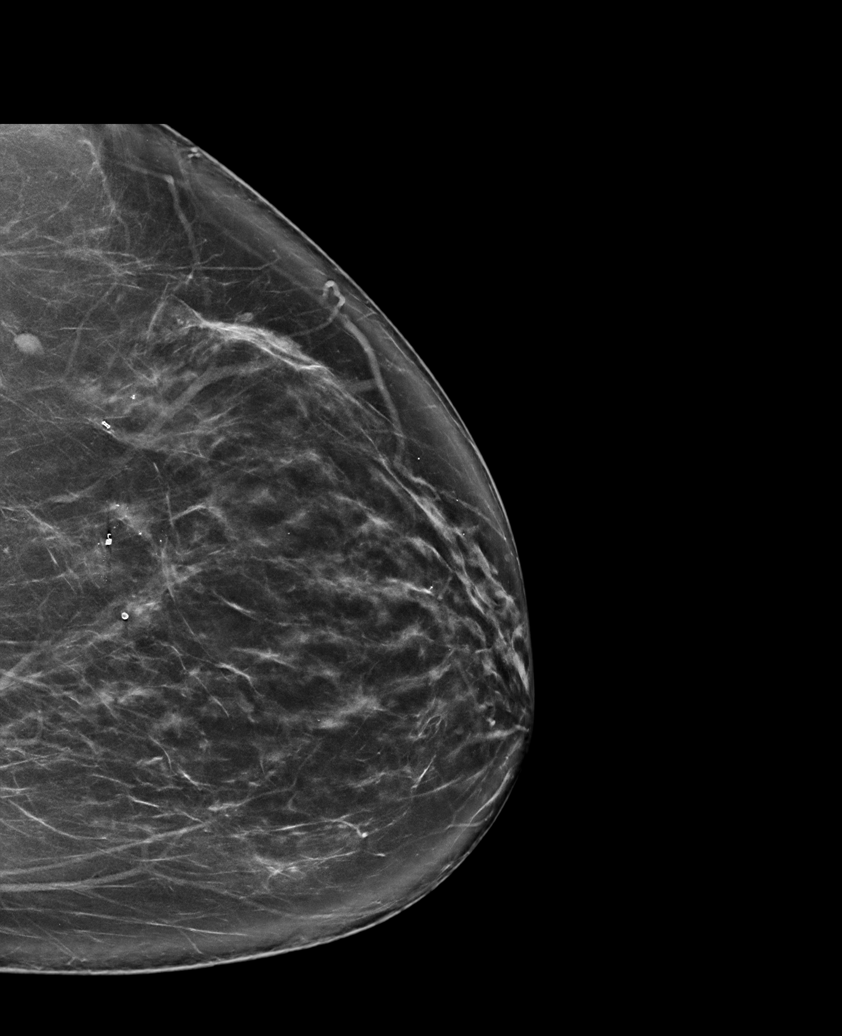

[R CC synth-2D]
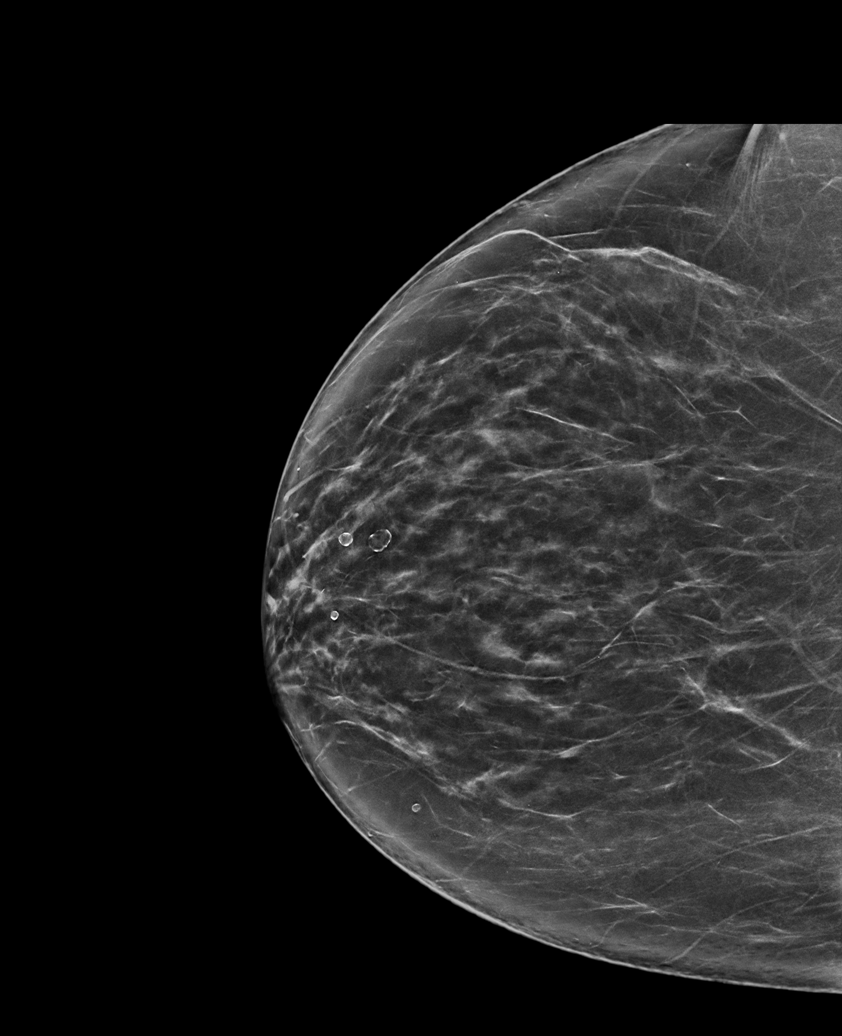

[R MLO synth-2D]
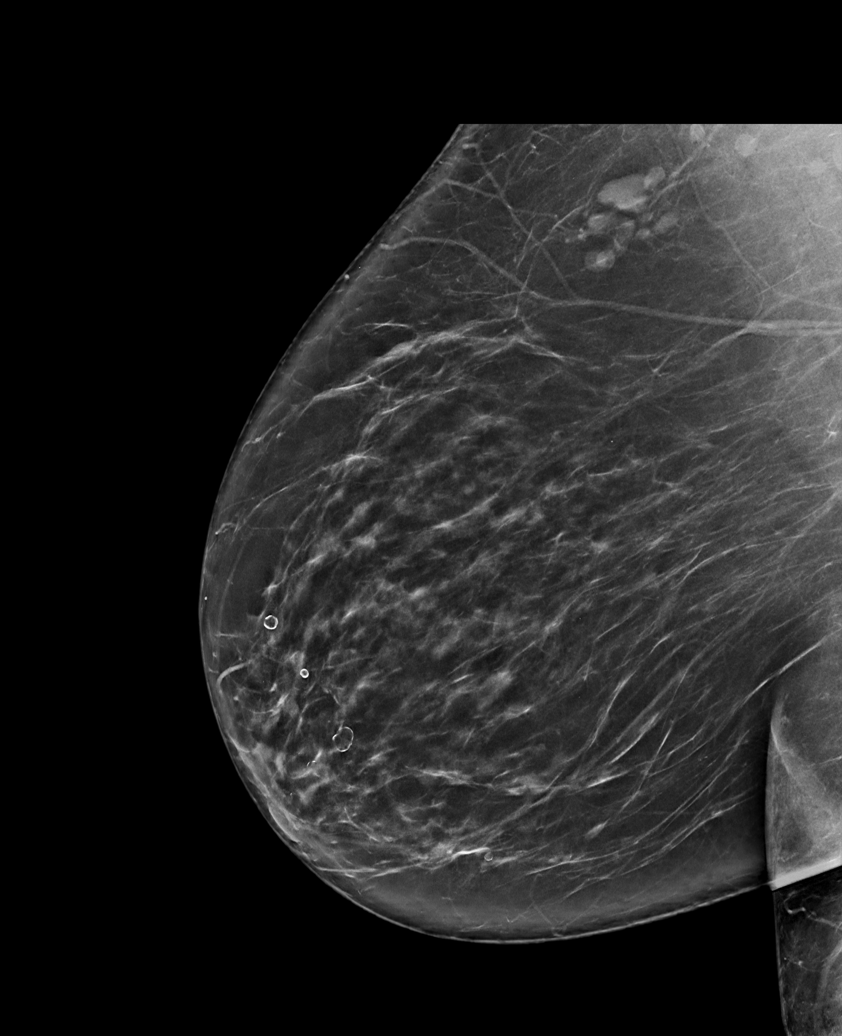

[L CC tomo · tomo slice 45/88.0]
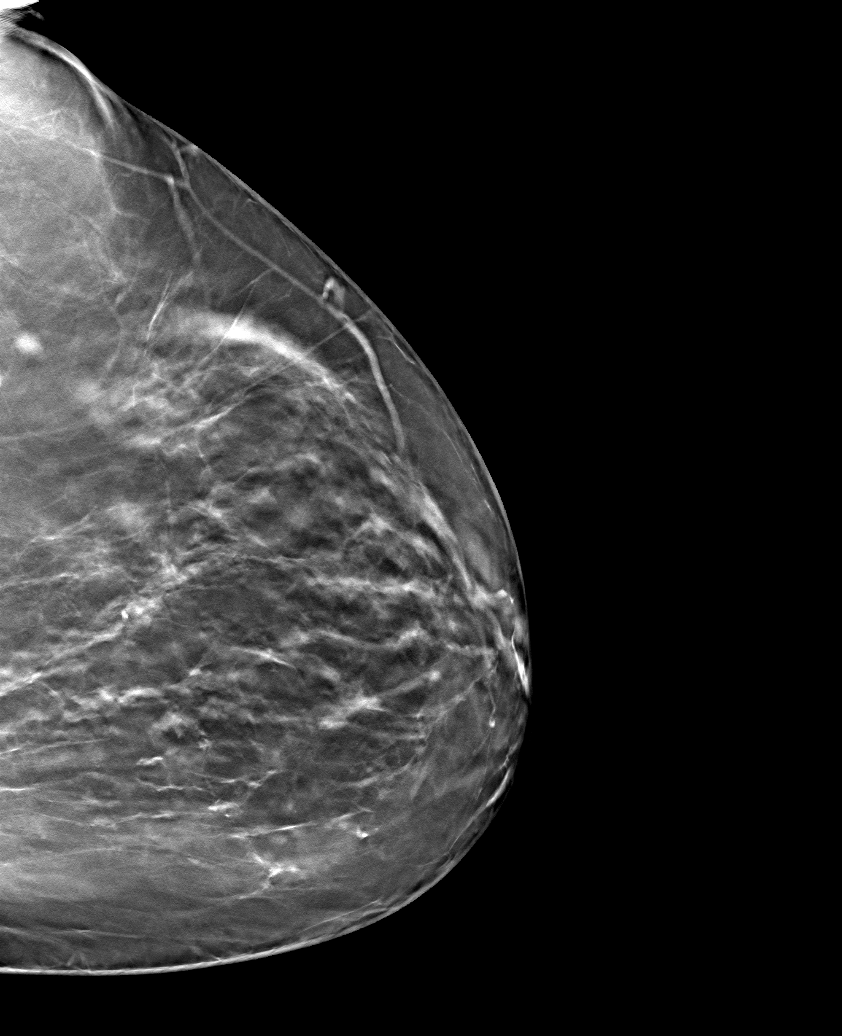

[6 of 30 positions shown; findings below may reference images not displayed]

ACR Breast Density Category b: There are scattered areas of
fibroglandular density.
FINDINGS: There are no findings suspicious for malignancy.
IMPRESSION: No mammographic evidence of malignancy. A result letter of this
screening mammogram will be mailed directly to the patient.

RECOMMENDATION:
Screening mammogram in one year. (Code:51-O-LD2)

BI-RADS CATEGORY  1: Negative.

## 2022-05-12 ENCOUNTER — Other Ambulatory Visit (HOSPITAL_BASED_OUTPATIENT_CLINIC_OR_DEPARTMENT_OTHER): Payer: Self-pay | Admitting: Family Medicine

## 2022-05-12 DIAGNOSIS — M79605 Pain in left leg: Secondary | ICD-10-CM

## 2022-05-16 ENCOUNTER — Ambulatory Visit (HOSPITAL_BASED_OUTPATIENT_CLINIC_OR_DEPARTMENT_OTHER)
Admission: RE | Admit: 2022-05-16 | Discharge: 2022-05-16 | Disposition: A | Discharge status: Home / Self Care | Payer: Medicare Other | Source: Ambulatory Visit | Attending: Family Medicine | Admitting: Family Medicine

## 2022-05-16 DIAGNOSIS — M79605 Pain in left leg: Secondary | ICD-10-CM | POA: Insufficient documentation

## 2022-05-19 ENCOUNTER — Ambulatory Visit (INDEPENDENT_AMBULATORY_CARE_PROVIDER_SITE_OTHER): Payer: Medicare Other | Admitting: Pulmonary Disease

## 2022-05-19 ENCOUNTER — Ambulatory Visit: Payer: Medicare Other

## 2022-05-19 ENCOUNTER — Encounter: Payer: Self-pay | Admitting: Pulmonary Disease

## 2022-05-19 ENCOUNTER — Telehealth: Payer: Self-pay | Admitting: Nurse Practitioner

## 2022-05-19 ENCOUNTER — Ambulatory Visit (INDEPENDENT_AMBULATORY_CARE_PROVIDER_SITE_OTHER): Payer: Medicare Other

## 2022-05-19 VITALS — BP 148/82 | HR 74 | Temp 97.9°F | Ht 63.0 in | Wt 278.6 lb

## 2022-05-19 DIAGNOSIS — K219 Gastro-esophageal reflux disease without esophagitis: Secondary | ICD-10-CM

## 2022-05-19 DIAGNOSIS — R059 Cough, unspecified: Secondary | ICD-10-CM

## 2022-05-19 DIAGNOSIS — J309 Allergic rhinitis, unspecified: Secondary | ICD-10-CM | POA: Diagnosis not present

## 2022-05-19 MED ORDER — FLUTICASONE PROPIONATE 50 MCG/ACT NA SUSP: 2.0000 | Freq: Every day | NASAL | 5 refills | Status: DC

## 2022-05-19 NOTE — Addendum Note (Signed)
Addended byOralia Rud M on: 05/19/2022 03:24 PM   Modules accepted: Orders

## 2022-05-19 NOTE — Telephone Encounter (Signed)
Left message for patient to call back and schedule Medicare Annual Wellness Visit (AWV).   Please offer to do virtually or by telephone.   Last AWV:  11/24/2020   Schedule at any time with [facility]   30 minute appointment for Virtual or phone  45 minute appointment for Initial virtual/phone  Any questions, please contact me at [phone]   Thank you,   [facility]  Ambulatory Clinical Support for Care Management [place] [facility] ??[phone] or ??[phone]
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
## 2022-05-19 NOTE — Patient Instructions (Signed)
Chronic cough: Pulmonary function test Chest x-ray You need to try to suppress your cough to allow your larynx (voice box) to heal.  For three days don't talk, laugh, sing, or clear your throat. Do everything you can to suppress the cough during this time. Use hard candies (sugarless Jolly Ranchers) or non-mint or non-menthol containing cough drops during this time to soothe your throat.  Use a cough suppressant (Delsym or what I have prescribed you) around the clock during this time.  After three days, gradually increase the use of your voice and back off on the cough suppressants.  Allergic rhinitis: Take over-the-counter or prescribed generic Claritin I recommend a steroid nose spray like fluticasone or triamcinolone 2 sprays each nostril daily no matter how you feel  Gastroesophageal reflux disease: Continue taking omeprazole as prescribed every day no matter how you feel Avoid fatty foods, alcohol, chocolate, caffeine and tobacco products No eating within 3 hours of bedtime   We will see you back in 4 to 6 weeks or sooner if needed

## 2022-06-24 ENCOUNTER — Ambulatory Visit (INDEPENDENT_AMBULATORY_CARE_PROVIDER_SITE_OTHER): Payer: 59 | Admitting: Pulmonary Disease

## 2022-06-24 ENCOUNTER — Encounter: Payer: Self-pay | Admitting: Pulmonary Disease

## 2022-06-24 VITALS — BP 118/72 | HR 76 | Temp 97.8°F | Ht 63.0 in | Wt 273.0 lb

## 2022-06-24 DIAGNOSIS — K219 Gastro-esophageal reflux disease without esophagitis: Secondary | ICD-10-CM | POA: Diagnosis not present

## 2022-06-24 DIAGNOSIS — R053 Chronic cough: Secondary | ICD-10-CM | POA: Diagnosis not present

## 2022-06-24 DIAGNOSIS — J309 Allergic rhinitis, unspecified: Secondary | ICD-10-CM | POA: Diagnosis not present

## 2022-06-24 DIAGNOSIS — R059 Cough, unspecified: Secondary | ICD-10-CM

## 2022-06-24 LAB — PULMONARY FUNCTION TEST
DL/VA % pred: 135 %
DL/VA: 5.77 ml/min/mmHg/L
DLCO cor % pred: 106 %
DLCO cor: 20.79 ml/min/mmHg
DLCO unc % pred: 106 %
DLCO unc: 20.79 ml/min/mmHg
FEF 25-75 Post: 2.45 L/sec
FEF 25-75 Pre: 1.88 L/sec
FEF2575-%Change-Post: 30 %
FEF2575-%Pred-Post: 106 %
FEF2575-%Pred-Pre: 82 %
FEV1-%Change-Post: 6 %
FEV1-%Pred-Post: 70 %
FEV1-%Pred-Pre: 66 %
FEV1-Post: 1.74 L
FEV1-Pre: 1.64 L
FEV1FVC-%Change-Post: 3 %
FEV1FVC-%Pred-Pre: 107 %
FEV6-%Change-Post: 2 %
FEV6-%Pred-Post: 64 %
FEV6-%Pred-Pre: 63 %
FEV6-Post: 1.99 L
FEV6-Pre: 1.95 L
FEV6FVC-%Pred-Post: 103 %
FEV6FVC-%Pred-Pre: 103 %
FVC-%Change-Post: 2 %
FVC-%Pred-Post: 62 %
FVC-%Pred-Pre: 61 %
FVC-Post: 1.99 L
FVC-Pre: 1.95 L
Post FEV1/FVC ratio: 87 %
Post FEV6/FVC ratio: 100 %
Pre FEV1/FVC ratio: 84 %
Pre FEV6/FVC Ratio: 100 %
RV % pred: 78 %
RV: 1.52 L
TLC % pred: 77 %
TLC: 3.79 L

## 2022-06-24 NOTE — Progress Notes (Signed)
Synopsis: Referred in December 2023 for chronic cough  Subjective:   PATIENT ID: Stacie Daugherty GENDER: female DOB: [birth year], MRN: [id number]   HPI  Chief Complaint  Patient presents with   Follow-up    Pt states she is still coughing but not as bad. Pt states she thinks that the acid reflux meds are working.    Stacie Daugherty will still have a dry cough from time to time if she doesn't have water or a hard candy around her.   She says she tried the nose spray but she's not using it regularly.  It helps a little. She has been using the antacid treatment has helped the most.  She doesn't have much post nasal drip or sinus drainage: stopped the claritin interfered with her ability to cough so she stopped.   She is taking Delsym and it helps.    Past Medical History:  Diagnosis Date   Allergy    mild   Anxiety    bipolar    Arthritis    both knees   Bipolar 1 disorder ([place])    Hospitalized multiple times for bipolar disorder ([place] - [facility], [facility], and [facility], most recently in [place])   Depression    bipolar   Edema of both lower extremities    Hidradenitis suppurativa    Hyperlipidemia    Hypertension    Joint pain    Lactose intolerance    Obesity    Osteoarthritis    SOB (shortness of breath)    Vitamin B 12 deficiency    Vitamin D deficiency      Review of Systems  Constitutional:  Negative for chills, fever, malaise/fatigue and weight loss.  HENT:  Negative for congestion, sinus pain and sore throat.   Respiratory:  Positive for cough. Negative for sputum production and shortness of breath.   Cardiovascular:  Negative for chest pain and leg swelling.      Objective:  Physical Exam   Vitals:   06/24/22 1319  BP: 118/72  Pulse: 76  Temp: 97.8 F (36.6 C)  TempSrc: Oral  SpO2: 97%  Weight: 273 lb (123.8 kg)  Height: '5\' 3"'$  (1.6 m)    Gen: well appearing HENT: OP clear, neck supple PULM: CTA B, normal  effort  CV: RRR, no mgr GI: BS+, soft, nontender Derm: no cyanosis or rash Psyche: normal mood and affect   CBC    Component Value Date/Time   WBC 4.5 01/13/2021 1020   WBC 5.4 03/01/2016 0917   RBC 4.12 01/13/2021 1020   RBC 4.04 03/01/2016 0917   HGB 13.1 01/13/2021 1020   HCT 39.7 01/13/2021 1020   PLT 241 04/15/2020 1143   MCV 96 01/13/2021 1020   MCH 31.8 01/13/2021 1020   MCH 30.7 03/01/2016 0917   MCHC 33.0 01/13/2021 1020   MCHC 32.9 03/01/2016 0917   RDW 12.9 01/13/2021 1020   LYMPHSABS 1.9 01/13/2021 1020   MONOABS 540 03/01/2016 0917   EOSABS 0.2 01/13/2021 1020   BASOSABS 0.0 01/13/2021 1020     Chest imaging: May 2023 two-view chest x-ray images reviewed showing normal pulmonary parenchyma, no infiltrate. December 2023 two-view chest x-ray images independently reviewed showing normal pulmonary parenchyma, normal cardiac silhouette  PFT: January 2024 full PFT ratio 87%, FVC 1.99 L 62% predicted, total lung capacity 3.79 L 77% predicted, DLCO 20.79 106% predicted  Labs:  Path:  Echo:  Heart Catheterization:       Assessment &  Plan:   Gastroesophageal reflux disease without esophagitis  Allergic rhinitis, unspecified seasonality, unspecified trigger  Chronic cough  Discussion: Her cough is improved somewhat but has not completely resolved.  It remains multifactorial: Gastroesophageal reflux disease and allergic rhinitis with ongoing laryngeal irritation from perpetual cough.  We talked about using Elavil but she is not interested in this because she is concerned that it would interfere with her other medications.  I explained to her that there is no evidence of an underlying lung disease contributing to it which was reassuring to her.  Chronic cough due to gastroesophageal reflux disease and allergic rhinitis: Because of ongoing laryngeal irritation I still strongly recommend voice rest In the event of a cold or respiratory infection be sure to  take Delsym over-the-counter to prevent the cough from getting worse Continue to treat gastroesophageal reflux disease with the antiacid Continue to avoid chocolate, alcohol, caffeine, fatty foods, mint For the allergic rhinitis continue to take the Flonase nose sprays 2 sprays each nostril daily When the cough gets worse it is important to rest her voice: You need to try to suppress your cough to allow your larynx (voice box) to heal.  For three days don't talk, laugh, sing, or clear your throat. Do everything you can to suppress the cough during this time. Use hard candies (sugarless Jolly Ranchers) or non-mint or non-menthol containing cough drops during this time to soothe your throat.  Use a cough suppressant (Delsym or what I have prescribed you) around the clock during this time.  After three days, gradually increase the use of your voice and back off on the cough suppressants.  We will see you back on an as-needed basis.  Immunizations: Immunization History  Administered Date(s) Administered   Influenza,inj,Quad PF,6+ Mos 05/06/2013, 03/01/2016, 02/27/2017, 03/22/2018   PFIZER(Purple Top)SARS-COV-2 Vaccination 08/22/2019, 09/12/2019   PPD Test 10/26/2012, 12/18/2012   Tdap 07/15/2015     Current Outpatient Medications:    ARIPiprazole (ABILIFY) 15 MG tablet, Take 15 mg by mouth at bedtime., Disp: , Rfl:    atorvastatin (LIPITOR) 20 MG tablet, TAKE 1 TABLET BY MOUTH EVERY DAY, Disp: 90 tablet, Rfl: 2   carbamazepine (TEGRETOL) 200 MG tablet, Take 1 tablet (200 mg total) by mouth 2 (two) times daily at 8 am and 10 pm., Disp: 60 tablet, Rfl: 0   carvedilol (COREG) 3.125 MG tablet, TAKE 1 TABLET BY MOUTH DAILY., Disp: 90 tablet, Rfl: 3   Cholecalciferol (VITAMIN D3) 25 MCG (1000 UT) CAPS, Take 1,000 Units by mouth daily., Disp: , Rfl:    DENTA 5000 PLUS 1.1 % CREA dental cream, Take by mouth., Disp: , Rfl:    divalproex (DEPAKOTE) 500 MG DR tablet, Take 500 mg by mouth 2 (two) times  daily., Disp: , Rfl:    fluticasone (FLONASE) 50 MCG/ACT nasal spray, Place 2 sprays into both nostrils daily., Disp: 16 g, Rfl: 5   FLUZONE QUADRIVALENT 0.5 ML injection, , Disp: , Rfl:    gabapentin (NEURONTIN) 300 MG capsule, Take 1 capsule (300 mg total) by mouth 3 (three) times daily at 8am, 2pm and bedtime. For agitation, Disp: 90 capsule, Rfl: 0   losartan-hydrochlorothiazide (HYZAAR) 100-25 MG tablet, Take 1 tablet by mouth daily., Disp: , Rfl:    MELATONIN PO, Take 1 tablet by mouth at bedtime., Disp: , Rfl:    Multiple Vitamin (MULTIVITAMIN PO), Take by mouth., Disp: , Rfl:    vitamin C (ASCORBIC ACID) 500 MG tablet, Take 500 mg by mouth daily.,  Disp: , Rfl:

## 2022-06-24 NOTE — Patient Instructions (Signed)
Full PFT performed today.

## 2022-06-24 NOTE — Progress Notes (Signed)
Full PFT performed today.

## 2022-06-24 NOTE — Patient Instructions (Signed)
Chronic cough due to gastroesophageal reflux disease and allergic rhinitis: Because of ongoing laryngeal irritation I still strongly recommend voice rest In the event of a cold or respiratory infection be sure to take Delsym over-the-counter to prevent the cough from getting worse Continue to treat gastroesophageal reflux disease with the antiacid Continue to avoid chocolate, alcohol, caffeine, fatty foods, mint For the allergic rhinitis continue to take the Flonase nose sprays 2 sprays each nostril daily When the cough gets worse it is important to rest her voice: You need to try to suppress your cough to allow your larynx (voice box) to heal.  For three days don't talk, laugh, sing, or clear your throat. Do everything you can to suppress the cough during this time. Use hard candies (sugarless Jolly Ranchers) or non-mint or non-menthol containing cough drops during this time to soothe your throat.  Use a cough suppressant (Delsym or what I have prescribed you) around the clock during this time.  After three days, gradually increase the use of your voice and back off on the cough suppressants.  We will see you back on an as-needed basis.

## 2022-08-22 ENCOUNTER — Telehealth: Payer: Self-pay | Admitting: Internal Medicine

## 2022-08-22 NOTE — Telephone Encounter (Signed)
Patient is requesting a provider switch to Dr. Percival Spanish due to a friend seeing him and highly recommending. Please advise.

## 2022-08-23 NOTE — Telephone Encounter (Signed)
  Pt returning call. She would like to switch back to Dr. Harrell Gave at [facility]. She will just drive to [place] to see her. Please advise

## 2022-08-23 NOTE — Telephone Encounter (Signed)
Called patient and left voicemail to inform her she is unable to switch to Dr. Percival Spanish at the [facility] office.

## 2022-09-09 NOTE — Telephone Encounter (Signed)
Ok by me

## 2022-09-09 NOTE — Telephone Encounter (Signed)
Pt calling back for an update  

## 2022-09-30 ENCOUNTER — Encounter (HOSPITAL_BASED_OUTPATIENT_CLINIC_OR_DEPARTMENT_OTHER): Payer: Self-pay | Admitting: Cardiology

## 2022-09-30 ENCOUNTER — Ambulatory Visit (INDEPENDENT_AMBULATORY_CARE_PROVIDER_SITE_OTHER): Payer: 59 | Admitting: Cardiology

## 2022-09-30 VITALS — BP 122/86 | HR 78 | Ht 63.0 in | Wt 273.2 lb

## 2022-09-30 DIAGNOSIS — R0609 Other forms of dyspnea: Secondary | ICD-10-CM | POA: Diagnosis not present

## 2022-09-30 DIAGNOSIS — I10 Essential (primary) hypertension: Secondary | ICD-10-CM

## 2022-09-30 DIAGNOSIS — E7800 Pure hypercholesterolemia, unspecified: Secondary | ICD-10-CM

## 2022-09-30 DIAGNOSIS — Z6841 Body Mass Index (BMI) 40.0 and over, adult: Secondary | ICD-10-CM

## 2022-09-30 DIAGNOSIS — Z7189 Other specified counseling: Secondary | ICD-10-CM

## 2022-09-30 NOTE — Patient Instructions (Signed)
Medication Instructions:  Your physician recommends that you continue on your current medications as directed. Please refer to the Current Medication list given to you today.  *If you need a refill on your cardiac medications before your next appointment, please call your pharmacy*  Follow-Up: At [facility], you and your health needs are our priority.  As part of our continuing mission to provide you with exceptional heart care, we have created designated Provider Care Teams.  These Care Teams include your primary Cardiologist (physician) and Advanced Practice Providers (APPs -  Physician Assistants and Nurse Practitioners) who all work together to provide you with the care you need, when you need it.  We recommend signing up for the patient portal called "MyChart".  Sign up information is provided on this After Visit Summary.  MyChart is used to connect with patients for Virtual Visits (Telemedicine).  Patients are able to view lab/test results, encounter notes, upcoming appointments, etc.  Non-urgent messages can be sent to your provider as well.   To learn more about what you can do with MyChart, go to [email or web address].    Your next appointment:   1 year(s)  Provider:   Bridgette Christopher, MD    

## 2022-09-30 NOTE — Progress Notes (Signed)
Cardiology Office Note:    Date:  09/30/2022   ID:  Stacie Daugherty, DOB November 13, 1960, MRN [id number]  PCP:  Estevan Oaks, NP  Cardiologist:  Jodelle Red, MD  Referring MD: Ivonne Andrew, NP   CC: follow up  History of Present Illness:    Stacie Daugherty is a 62 y.o. female with a hx of hypertension, hyperlipidemia, obesity, osteoarthritis, bipolar disorder, depression, anxiety who is seen for follow up. I initially met her 03/25/19  as a new consult at the request of Ivonne Andrew, NP for the evaluation and management of hypertension, indication for stress test.  Today: Asked to review echo from 2023, did at length.   Also has "hacking" cough for the last year or so. Only clear saliva. Worst in the evening. Does have acid reflux.  Denies chest pain, shortness of breath at rest or with normal exertion. No PND, orthopnea, LE edema or unexpected weight gain. No syncope or palpitations. ROS otherwise negative except as noted.   Past Medical History:  Diagnosis Date   Allergy    mild   Anxiety    bipolar    Arthritis    both knees   Bipolar 1 disorder (HCC)    Hospitalized multiple times for [place] disorder ([place] - [facility], [facility], and [facility], most recently in [place])   Depression    bipolar   Edema of both lower extremities    Hidradenitis suppurativa    Hyperlipidemia    Hypertension    Joint pain    Lactose intolerance    Obesity    Osteoarthritis    SOB (shortness of breath)    Vitamin B 12 deficiency    Vitamin D deficiency     Past Surgical History:  Procedure Laterality Date   BREAST BIOPSY Left 2012   benign   COLOSTOMY  07/06/2015   INDUCED ABORTION     in patient's 20s in [place], [place].   WISDOM TOOTH EXTRACTION      Current Medications: Current Outpatient Medications on File Prior to Visit  Medication Sig   ARIPiprazole (ABILIFY) 15 MG tablet Take 15 mg by mouth at  bedtime.   atorvastatin (LIPITOR) 40 MG tablet Take 40 mg by mouth daily.   carbamazepine (TEGRETOL) 200 MG tablet Take 1 tablet (200 mg total) by mouth 2 (two) times daily at 8 am and 10 pm.   carvedilol (COREG) 3.125 MG tablet TAKE 1 TABLET BY MOUTH DAILY.   Cholecalciferol (VITAMIN D3) 25 MCG (1000 UT) CAPS Take 1,000 Units by mouth daily.   DENTA 5000 PLUS 1.1 % CREA dental cream Take by mouth.   divalproex (DEPAKOTE) 500 MG DR tablet Take 500 mg by mouth 2 (two) times daily.   FLUZONE QUADRIVALENT 0.5 ML injection    gabapentin (NEURONTIN) 300 MG capsule Take 1 capsule (300 mg total) by mouth 3 (three) times daily at 8am, 2pm and bedtime. For agitation   loratadine (CLARITIN) 10 MG tablet Take 10 mg by mouth daily.   losartan-hydrochlorothiazide (HYZAAR) 100-25 MG tablet Take 1 tablet by mouth daily.   MELATONIN PO Take 1 tablet by mouth at bedtime.   Multiple Vitamin (MULTIVITAMIN PO) Take by mouth.   naproxen (NAPROSYN) 500 MG tablet Take 500 mg by mouth as needed for moderate pain, mild pain or headache.   omeprazole (PRILOSEC) 40 MG capsule Take 40 mg by mouth daily.   vitamin C (ASCORBIC ACID) 500 MG tablet Take 500 mg  by mouth daily.   No current facility-administered medications on file prior to visit.     Allergies:   Haldol [haloperidol lactate]   Social History   Tobacco Use   Smoking status: Never    Passive exposure: Never   Smokeless tobacco: Never   Tobacco comments:    Smoked few cigarettes, socially, "did not inhale".  Only tried cigarettes a few times, then stopped.  Vaping Use   Vaping Use: Never used  Substance Use Topics   Alcohol use: No    Comment: Social alcohol use when younger   Drug use: No    Comment: Social marijuana use when younger    Family History: family history includes Anxiety disorder in her mother; Bipolar disorder in her mother; Cancer in her father; Colon cancer in her maternal grandfather; Depression in her mother; Heart disease in  an other family member; Hypertension in her maternal grandfather and paternal grandmother; Pulmonary embolism in her mother. There is no history of Colon polyps, Stomach cancer, Rectal cancer, Breast cancer, or Esophageal cancer. mom had chronic heart failure, had pulmonary issues/PE. Grandmother died age 23, unclear if heart disease or old age. No early cardiac death, no MI that she knows of. Brother (who is physician) had mild stroke.   ROS:   Please see the history of present illness.  Additional pertinent ROS otherwise unremarkable.   EKGs/Labs/Other Studies Reviewed:    The following studies were reviewed today: ETT May 08, 2019 Blood pressure demonstrated a hypertensive response to exercise. There was no ST segment deviation noted during stress. No T wave inversion was noted during stress.   Hypertensive response to exercise. There is 1 mm ST depression in V3-V6 at baseline that "pseudonormalizes" with exercise. Suspect changes may be related to left ventricular hypertrophy, but due to baseline abnormalities cannot exclude coronary insufficiency. Further evaluation with a more specific (imaging) method may be indicated.  Echo 2016 - Left ventricle: The cavity size was normal. There was mild    concentric hypertrophy. Systolic function was vigorous. The    estimated ejection fraction was in the range of 65% to 70%. There    was dynamic obstruction in the outflow tract, with a peak    velocity of 148 cm/sec and a peak gradient of 9 mm Hg. Wall    motion was normal; there were no regional wall motion    abnormalities.  - Mitral valve: There was mild regurgitation.  - Left atrium: The atrium was mildly dilated.  - Pulmonary arteries: Systolic pressure was moderately increased.    PA peak pressure: 47 mm Hg (S).   EKG:  EKG is personally reviewed.  The ekg ordered today demonstrates NSR with HR 78 bpm.  Recent Labs: No results found for requested labs within last 365 days.  Recent  Lipid Panel    Component Value Date/Time   CHOL 194 07/28/2021 1230   TRIG 84 07/28/2021 1230   HDL 81 07/28/2021 1230   CHOLHDL 2.4 07/28/2021 1230   CHOLHDL 2.0 03/31/2017 0904   VLDL 26 07/12/2016 0855   LDLCALC 98 07/28/2021 1230   LDLCALC 64 03/31/2017 0904    Physical Exam:    VS:  BP 122/86 (BP Location: Right Arm, Patient Position: Sitting, Cuff Size: Large)   Pulse 78   Ht 5\' 3"  (1.6 m)   Wt 273 lb 3.2 oz (123.9 kg)   BMI 48.40 kg/m     Wt Readings from Last 3 Encounters:  09/30/22 273 lb 3.2 oz (123.9  kg)  06/24/22 273 lb (123.8 kg)  05/19/22 278 lb 9.6 oz (126.4 kg)    GEN: Well nourished, well developed in no acute distress HEENT: Normal, moist mucous membranes NECK: No JVD CARDIAC: regular rhythm, normal S1 and S2, no rubs or gallops. No murmur. VASCULAR: Radial and DP pulses 2+ bilaterally. No carotid bruits RESPIRATORY:  Clear to auscultation without rales, wheezing or rhonchi  ABDOMEN: Soft, non-tender, non-distended MUSCULOSKELETAL:  Ambulates independently SKIN: Warm and dry, no pitting edema but mild nonpitting edema at ankles NEUROLOGIC:  Alert and oriented x 3. No focal neuro deficits noted. PSYCHIATRIC:  Normal affect   ASSESSMENT:    1. Dyspnea on exertion   2. Essential hypertension   3. Pure hypercholesterolemia   4. Class 3 severe obesity due to excess calories without serious comorbidity with body mass index (BMI) of 45.0 to 49.9 in adult [facility])   5. Cardiac risk counseling     PLAN:    Poor exercise tolerance/dyspnea on exertion: working on this with a trainer, had been much improved. No chest pain.  Hypercholesterolemia:  -continue atorvastatin  Hypertension: -continue losartan-HCTZ, carvedilol 3.125 mg BID.  Obesity, class 3: BMI 48. Working on weight loss with diet and exercise  Cardiac risk counseling and prevention recommendations: -recommend heart healthy/Mediterranean diet, with whole grains, fruits, vegetable, fish, lean  meats, nuts, and olive oil. Limit salt. -recommend moderate walking, 3-5 times/week for 30-50 minutes each session. Aim for at least 150 minutes.week. Goal should be pace of 3 miles/hours, or walking 1.5 miles in 30 minutes -recommend avoidance of tobacco products. Avoid excess alcohol. -ASCVD risk score: The 10-year ASCVD risk score (Arnett DK, et al., 2019) is: 10.7%   Values used to calculate the score:     Age: 8 years     Sex: Female     Is Non-Hispanic African American: Yes     Diabetic: Yes     Tobacco smoker: No     Systolic Blood Pressure: 122 mmHg     Is BP treated: Yes     HDL Cholesterol: 81 mg/dL     Total Cholesterol: 194 mg/dL    Plan for follow up: 1 year  Jodelle Red, MD PhD 09/30/2022     [facility]

## 2022-10-27 ENCOUNTER — Ambulatory Visit (HOSPITAL_BASED_OUTPATIENT_CLINIC_OR_DEPARTMENT_OTHER): Payer: 59 | Admitting: Cardiology

## 2022-11-02 ENCOUNTER — Other Ambulatory Visit: Payer: Self-pay | Admitting: Nurse Practitioner

## 2022-11-02 DIAGNOSIS — Z0000 Encounter for general adult medical examination without abnormal findings: Secondary | ICD-10-CM

## 2022-11-18 ENCOUNTER — Encounter (HOSPITAL_BASED_OUTPATIENT_CLINIC_OR_DEPARTMENT_OTHER): Payer: Self-pay | Admitting: Cardiology

## 2023-01-11 ENCOUNTER — Ambulatory Visit
Admission: RE | Admit: 2023-01-11 | Discharge: 2023-01-11 | Disposition: A | Discharge status: Home / Self Care | Payer: 59 | Source: Ambulatory Visit | Attending: Nurse Practitioner | Admitting: Nurse Practitioner

## 2023-01-11 DIAGNOSIS — Z0000 Encounter for general adult medical examination without abnormal findings: Secondary | ICD-10-CM

## 2023-04-12 ENCOUNTER — Telehealth: Payer: Self-pay | Admitting: Pulmonary Disease

## 2023-04-12 DIAGNOSIS — K219 Gastro-esophageal reflux disease without esophagitis: Secondary | ICD-10-CM

## 2023-04-12 DIAGNOSIS — J309 Allergic rhinitis, unspecified: Secondary | ICD-10-CM

## 2023-04-12 DIAGNOSIS — R053 Chronic cough: Secondary | ICD-10-CM

## 2023-04-12 NOTE — Telephone Encounter (Signed)
Patient states needs referral to Dr. Bari [facility]. Patient phone number is [phone].

## 2023-04-14 NOTE — Telephone Encounter (Signed)
Patient only saw Dr. Kendrick Fries here at [facility]. Katie, please advise if you are okay with [place] placing a referral under you for pt to be referred to Dr. Kendrick Fries at [facility].

## 2023-04-14 NOTE — Telephone Encounter (Signed)
That's fine with me. Thanks

## 2023-04-14 NOTE — Telephone Encounter (Signed)
Referral placed.

## 2023-04-17 ENCOUNTER — Other Ambulatory Visit (HOSPITAL_COMMUNITY)
Admission: RE | Admit: 2023-04-17 | Discharge: 2023-04-17 | Disposition: A | Discharge status: Home / Self Care | Payer: 59 | Source: Ambulatory Visit | Attending: Advanced Practice Midwife | Admitting: Advanced Practice Midwife

## 2023-04-17 ENCOUNTER — Ambulatory Visit: Payer: 59 | Admitting: Advanced Practice Midwife

## 2023-04-17 ENCOUNTER — Encounter: Payer: Self-pay | Admitting: Advanced Practice Midwife

## 2023-04-17 VITALS — BP 127/81 | HR 84 | Ht 63.0 in | Wt 268.2 lb

## 2023-04-17 DIAGNOSIS — Z01419 Encounter for gynecological examination (general) (routine) without abnormal findings: Secondary | ICD-10-CM

## 2023-04-17 DIAGNOSIS — Z124 Encounter for screening for malignant neoplasm of cervix: Secondary | ICD-10-CM | POA: Diagnosis present

## 2023-04-17 DIAGNOSIS — Z1151 Encounter for screening for human papillomavirus (HPV): Secondary | ICD-10-CM | POA: Diagnosis not present

## 2023-04-17 NOTE — Progress Notes (Signed)
Pt presents for AEX Last PAP 12/2020 Mammogram 12/2022 Colonoscopy 2021 Requesting PAP, declines STD testing

## 2023-04-17 NOTE — Progress Notes (Signed)
   Subjective:     Stacie Daugherty is a 62 y.o. female here at [facility] for a routine exam.  Current complaints: none.  Personal and family health history reviewed: yes.  Do you have a primary care provider? yes Do you feel safe at home? yes  Flowsheet Row Office Visit from 07/28/2021 in [facility] Of [facility]  PHQ-2 Total Score 0       Health Maintenance Due  Topic Date Due   Zoster Vaccines- Shingrix (1 of 2) Never done   Medicare Annual Wellness (AWV)  11/24/2021   COVID-19 Vaccine (3 - 2023-24 season) 01/29/2023     Risk factors for chronic health problems: Smoking: Alchohol/how much: Pt BMI: Body mass index is 47.51 kg/m.   Gynecologic History Patient's last menstrual period was 04/13/2016. Contraception: post menopausal status Last Pap: 2022. Results were: normal Last mammogram: 12/2022. Results were: normal  Obstetric History OB History  Gravida Para Term Preterm AB Living  2 1 1  0 1 1  SAB IAB Ectopic Multiple Live Births    1          # Outcome Date GA Lbr Len/2nd Weight Sex Type Anes PTL Lv  2 IAB           1 Term              The following portions of the patient's history were reviewed and updated as appropriate: allergies, current medications, past family history, past medical history, past social history, past surgical history, and problem list.  Review of Systems Pertinent items noted in HPI and remainder of comprehensive ROS otherwise negative.    Objective:  BP 127/81   Pulse 84   Ht 5\' 3"  (1.6 m)   Wt 268 lb 3.2 oz (121.7 kg)   LMP 04/13/2016   BMI 47.51 kg/m   VS reviewed, nursing note reviewed,  Constitutional: well developed, well nourished, no distress HEENT: normocephalic, thyroid without enlargement or mass HEART: RRR, no murmurs rubs/gallops RESP: clear and equal to auscultation bilaterally in all lobes  Breast Exam:  exam performed: right breast normal without mass, skin  or nipple changes or axillary nodes, left breast normal without mass, skin or nipple changes or axillary nodes Abdomen: soft Neuro: alert and oriented x 3 Skin: warm, dry Psych: affect normal Pelvic exam: Performed: Cervix pink, visually closed, without lesion, scant white creamy discharge, vaginal walls and external genitalia normal Bimanual exam: Cervix 0/long/high, firm, anterior, neg CMT, uterus nontender, nonenlarged, adnexa without tenderness, enlargement, or mass        Assessment/Plan:   1. Encounter for annual routine gynecological examination --LMP 5-6 years ago, no gyn concerns or problems   --Last PAP 12/2020 --Mammogram 12/2022 --Colonoscopy 2021 --declines STD testing   --Reviewed Pap guidelines, pt at 2.5 years since last pap, prefers pap today.  F/U for annual exam, Pap in 3 years.  - Cytology - PAP( )     Return for annual exam.   Sharen Counter, CNM 2:01 PM

## 2023-04-18 LAB — CYTOLOGY - PAP
Comment: NEGATIVE
Diagnosis: NEGATIVE
High risk HPV: NEGATIVE

## 2023-09-13 NOTE — Progress Notes (Signed)
 Cardiology Office Note:    Date:  09/25/2023   ID:  MARTELL RACCA, DOB 1960/09/22, MRN [id number]  PCP:  Carolyn Cisco, NP  Cardiologist:  Sheryle Donning, MD     Referring MD: Carolyn Cisco, NP   Chief Complaint: routine follow-up of dyspnea of exertion and hypertension  History of Present Illness:    Stacie Daugherty is a 63 y.o. female with a history of dyspnea on exertion felt to be due to deconditioning/ poor exercise tolerance, mild mitral regurgitation, midl to moderate tricuspid regurgitation, hypertension, hyperlipidemia, obesity, osteoarthritis, anxiety/ depression, and bipolar disorder who is followed by Dr. Veryl Gottron and presents today for routine follow-up.    Patient was referred to Dr. Veryl Gottron in 02/2021 for further evaluation of one isolated episode of non-exertional chest tightness. ETT was ordered and showed 1mm ST depression in V3-V6 at baseline that "pseudonormalized" with exercise. Changes were felt to likely be due to LVH but coronary insufficiency could not be excluded. No additional ischemic evaluation was felt to be necessary at that time and she was given the OK to start working with a Systems analyst. Echo in 09/2021 for further evaluation of dyspnea on exertion showed LVEF of 55-60% with normal wall motion and diastolic function, normal RV function, mild MR, and mild to moderate TR. Dyspnea was ultimately felt to be due to poor exercise tolerance.   She was last seen by Dr. Veryl Gottron in 09/2022 at which time she was having a "hacking" cough but was doing well from a cardiac standpoint with no chest pain or shortness of breath.   Patient presents today for follow-up.  Here alone.  Patient continues to have dyspnea on exertion and states she cannot go very far without getting short of breath but it is this to her weight.  She is no longer using a trainer because her brother was paying for this.  However, she is going to the  [facility] 4 times a week and doing water  aerobics as well as a walking class.  Dyspnea stable and not any worse from last year.  She denies any dyspnea with routine activities around her house or when walking around the grocery store.  No dyspnea at rest.  No orthopnea or PND.  She does have chronic lower extremity edema (left greater than right) but this is stable.  No chest pain, palpitations, lightheadedness, dizziness, syncope.  EKGs/Labs/Other Studies Reviewed:    The following studies were reviewed:  Exercise Tolerance Test 04/17/2019: Blood pressure demonstrated a hypertensive response to exercise. There was no ST segment deviation noted during stress. No T wave inversion was noted during stress.   Hypertensive response to exercise. There is 1 mm ST depression in V3-V6 at baseline that "pseudonormalizes" with exercise. Suspect changes may be related to left ventricular hypertrophy, but due to baseline abnormalities cannot exclude coronary insufficiency. Further evaluation with a more specific (imaging) method may be indicated. _______________  Echocardiogram 10/06/2021: Impressions: 1. Left ventricular ejection fraction, by estimation, is 55 to 60%. The  left ventricle has normal function. The left ventricle has no regional  wall motion abnormalities. Left ventricular diastolic parameters were  normal.   2. Right ventricular systolic function is normal. The right ventricular  size is normal.   3. Left atrial size was mildly dilated.   4. The mitral valve is normal in structure. Mild mitral valve  regurgitation. No evidence of mitral stenosis.   5. Tricuspid valve regurgitation is mild to moderate.   6.  The aortic valve is tricuspid. Aortic valve regurgitation is not  visualized. No aortic stenosis is present.   7. The inferior vena cava is normal in size with greater than 50%  respiratory variability, suggesting right atrial pressure of 3 mmHg.    EKG:  EKG ordered today.   EKG  Interpretation Date/Time:  Monday September 25 2023 13:37:13 EDT Ventricular Rate:  75 PR Interval:  178 QRS Duration:  72 QT Interval:  384 QTC Calculation: 428 R Axis:   35  Text Interpretation: Normal sinus rhythm Underlying artifact Non-specific T wave changes No acute ischemic changes compared to prior tracing Confirmed by Larinda Herter [phone]) on 09/25/2023 1:53:42 PM    Recent Labs: No results found for requested labs within last 365 days.  Recent Lipid Panel    Component Value Date/Time   CHOL 194 07/28/2021 1230   TRIG 84 07/28/2021 1230   HDL 81 07/28/2021 1230   CHOLHDL 2.4 07/28/2021 1230   CHOLHDL 2.0 03/31/2017 0904   VLDL 26 07/12/2016 0855   LDLCALC 98 07/28/2021 1230   LDLCALC 64 03/31/2017 0904    Physical Exam:    Vital Signs: BP (!) 130/90 (BP Location: Right Arm, Patient Position: Sitting, Cuff Size: Large)   Pulse 74   Ht 5' 2.5" (1.588 m)   Wt 274 lb 12.8 oz (124.6 kg)   LMP 04/13/2016   SpO2 95%   BMI 49.46 kg/m     Wt Readings from Last 3 Encounters:  09/25/23 274 lb 12.8 oz (124.6 kg)  04/17/23 268 lb 3.2 oz (121.7 kg)  09/30/22 273 lb 3.2 oz (123.9 kg)     General: 63 y.o. obese African-American female in no acute distress. HEENT: Normocephalic and atraumatic. Sclera clear.  Neck: Supple. No carotid bruits. No JVD. Heart: RRR. Distinct S1 and S2. II/VI systolic murmur. Lungs: No increased work of breathing. Clear to ausculation bilaterally. No wheezes, rhonchi, or rales.  Extremities: Mild non-pitting edema of bilateral lower extremities.  Skin: Warm and dry. Neuro: No focal deficits. Psych: Normal affect. Responds appropriately.   Assessment:    1. Dyspnea on exertion   2. Mild mitral regurgitation   3. Tricuspid valve insufficiency, unspecified etiology   4. Essential hypertension   5. Hyperlipidemia, unspecified hyperlipidemia type   6. Morbid obesity (HCC)     Plan:    Dyspnea on Exertion  Patient has a history of  dyspnea on exertion that has been felt to be due to deconditioning/ poor exercise tolerance. Prior Echo in 09/2021 showed normal LV function.  - She continues to have dyspnea on exertion but this is stable.  No other CHF symptoms and no chest pain. - Suspect dyspnea is primarily due to obesity and deconditioning.  Encourage patient to increase physical activity as tolerated continue to work on weight loss. - I do not think any additional cardiac evaluation is necessary at this time.  However advised patient to let us  know if she has any new or worsening dyspnea.  Mild Mitral Regurgitation Mild to Moderate Tricuspid Regurgitation Noted on Echo in 09/2021.  - Continue routine monitoring with serial Echos. Consider repeat Echo in 09/2024.   Hypertension BP borderline elevated in the office today.  Initially 130/90 and then 138/82 on my personal recheck at the end of the visit.  - Current medications: Losartan -HCTZ 100-25mg  daily and Coreg  3.125mg  twice daily.  However, she is only taking Coreg  once a day. - Will increase Coreg  to 3.125 mg twice daily.  Hyperlipidemia  Lipid panel in 07/2021: Total Cholesterol 194, Triglycerides 84, HLD 81, LDL 98.  - Continue Lipitor 40mg  daily.  - Labs followed by PCP. She is scheduled to see PCP tomorrow.  Morbid Obesity  BMI 49. - Discussed increasing physical activity with goal of 150 minutes of aerobic activity per week and diet modifications. - [facility].  She states she has been there before but does not want to go back there.  She is scheduled to see her PCP tomorrow and is wanting to discuss a referral to a Dietitian at that time.  Disposition: Follow up in 1 year.    Signed, Casimer Clear, PA-C  09/25/2023 6:06 PM    [facility]

## 2023-09-25 ENCOUNTER — Encounter: Payer: Self-pay | Admitting: Student

## 2023-09-25 ENCOUNTER — Ambulatory Visit: Attending: Student | Admitting: Student

## 2023-09-25 VITALS — BP 130/90 | HR 74 | Ht 62.5 in | Wt 274.8 lb

## 2023-09-25 DIAGNOSIS — I340 Nonrheumatic mitral (valve) insufficiency: Secondary | ICD-10-CM | POA: Diagnosis not present

## 2023-09-25 DIAGNOSIS — R0609 Other forms of dyspnea: Secondary | ICD-10-CM

## 2023-09-25 DIAGNOSIS — I10 Essential (primary) hypertension: Secondary | ICD-10-CM | POA: Diagnosis not present

## 2023-09-25 DIAGNOSIS — I071 Rheumatic tricuspid insufficiency: Secondary | ICD-10-CM

## 2023-09-25 DIAGNOSIS — E785 Hyperlipidemia, unspecified: Secondary | ICD-10-CM

## 2023-09-25 MED ORDER — CARVEDILOL 3.125 MG PO TABS: 3.1250 mg | ORAL_TABLET | Freq: Two times a day (BID) | ORAL | 12 refills | Status: AC

## 2023-09-25 NOTE — Patient Instructions (Signed)
 Medication Instructions:  TAKE YOUR CARVEDILOL  TWICE DAILY *If you need a refill on your cardiac medications before your next appointment, please call your pharmacy*  Lab Work: NONE  Follow-Up: At [facility], you and your health needs are our priority.  As part of our continuing mission to provide you with exceptional heart care, our providers are all part of one team.  This team includes your primary Cardiologist (physician) and Advanced Practice Providers or APPs (Physician Assistants and Nurse Practitioners) who all work together to provide you with the care you need, when you need it.  Your next appointment:   12 month(s)  Provider:   Sheryle Donning, MD or Sharren Decree, PA-C          We recommend signing up for the patient portal called "MyChart".  Sign up information is provided on this After Visit Summary.  MyChart is used to connect with patients for Virtual Visits (Telemedicine).  Patients are able to view lab/test results, encounter notes, upcoming appointments, etc.  Non-urgent messages can be sent to your provider as well.   To learn more about what you can do with MyChart, go to [email or web address].

## 2023-12-05 ENCOUNTER — Other Ambulatory Visit: Payer: Self-pay | Admitting: Nurse Practitioner

## 2023-12-05 DIAGNOSIS — Z1231 Encounter for screening mammogram for malignant neoplasm of breast: Secondary | ICD-10-CM

## 2023-12-18 ENCOUNTER — Emergency Department (HOSPITAL_BASED_OUTPATIENT_CLINIC_OR_DEPARTMENT_OTHER)
Admission: EM | Admit: 2023-12-18 | Discharge: 2023-12-18 | Disposition: A | Discharge status: Home / Self Care | Attending: Emergency Medicine | Admitting: Emergency Medicine

## 2023-12-18 ENCOUNTER — Other Ambulatory Visit: Payer: Self-pay

## 2023-12-18 ENCOUNTER — Emergency Department (HOSPITAL_BASED_OUTPATIENT_CLINIC_OR_DEPARTMENT_OTHER)

## 2023-12-18 ENCOUNTER — Encounter (HOSPITAL_BASED_OUTPATIENT_CLINIC_OR_DEPARTMENT_OTHER): Payer: Self-pay | Admitting: Urology

## 2023-12-18 DIAGNOSIS — S299XXA Unspecified injury of thorax, initial encounter: Secondary | ICD-10-CM | POA: Diagnosis present

## 2023-12-18 DIAGNOSIS — S301XXA Contusion of abdominal wall, initial encounter: Secondary | ICD-10-CM | POA: Insufficient documentation

## 2023-12-18 DIAGNOSIS — R911 Solitary pulmonary nodule: Secondary | ICD-10-CM | POA: Diagnosis not present

## 2023-12-18 DIAGNOSIS — S2001XA Contusion of right breast, initial encounter: Secondary | ICD-10-CM | POA: Insufficient documentation

## 2023-12-18 DIAGNOSIS — K760 Fatty (change of) liver, not elsewhere classified: Secondary | ICD-10-CM | POA: Insufficient documentation

## 2023-12-18 DIAGNOSIS — Y92410 Unspecified street and highway as the place of occurrence of the external cause: Secondary | ICD-10-CM | POA: Insufficient documentation

## 2023-12-18 DIAGNOSIS — K5730 Diverticulosis of large intestine without perforation or abscess without bleeding: Secondary | ICD-10-CM | POA: Diagnosis not present

## 2023-12-18 DIAGNOSIS — Z79899 Other long term (current) drug therapy: Secondary | ICD-10-CM | POA: Insufficient documentation

## 2023-12-18 DIAGNOSIS — I700 Atherosclerosis of aorta: Secondary | ICD-10-CM | POA: Insufficient documentation

## 2023-12-18 LAB — CBC WITH DIFFERENTIAL/PLATELET
Abs Immature Granulocytes: 0.02 K/uL (ref 0.00–0.07)
Basophils Absolute: 0 K/uL (ref 0.0–0.1)
Basophils Relative: 0 %
Eosinophils Absolute: 0.2 K/uL (ref 0.0–0.5)
Eosinophils Relative: 3 %
HCT: 38.7 % (ref 36.0–46.0)
Hemoglobin: 12.9 g/dL (ref 12.0–15.0)
Immature Granulocytes: 0 %
Lymphocytes Relative: 23 %
Lymphs Abs: 1.7 K/uL (ref 0.7–4.0)
MCH: 31.9 pg (ref 26.0–34.0)
MCHC: 33.3 g/dL (ref 30.0–36.0)
MCV: 95.6 fL (ref 80.0–100.0)
Monocytes Absolute: 0.7 K/uL (ref 0.1–1.0)
Monocytes Relative: 10 %
Neutro Abs: 4.5 K/uL (ref 1.7–7.7)
Neutrophils Relative %: 64 %
Platelets: 229 K/uL (ref 150–400)
RBC: 4.05 MIL/uL (ref 3.87–5.11)
RDW: 12.6 % (ref 11.5–15.5)
WBC: 7.2 K/uL (ref 4.0–10.5)
nRBC: 0 % (ref 0.0–0.2)

## 2023-12-18 LAB — COMPREHENSIVE METABOLIC PANEL WITH GFR
ALT: 18 U/L (ref 0–44)
AST: 24 U/L (ref 15–41)
Albumin: 4.2 g/dL (ref 3.5–5.0)
Alkaline Phosphatase: 60 U/L (ref 38–126)
Anion gap: 12 (ref 5–15)
BUN: 18 mg/dL (ref 8–23)
CO2: 26 mmol/L (ref 22–32)
Calcium: 10 mg/dL (ref 8.9–10.3)
Chloride: 102 mmol/L (ref 98–111)
Creatinine, Ser: 0.83 mg/dL (ref 0.44–1.00)
GFR, Estimated: >60 mL/min (ref >60)
Glucose, Bld: 98 mg/dL (ref 70–99)
Potassium: 4 mmol/L (ref 3.5–5.1)
Sodium: 141 mmol/L (ref 135–145)
Total Bilirubin: 0.3 mg/dL (ref 0.0–1.2)
Total Protein: 7.4 g/dL (ref 6.5–8.1)

## 2023-12-18 MED ORDER — METHOCARBAMOL 500 MG PO TABS: 500.0000 mg | ORAL_TABLET | Freq: Two times a day (BID) | ORAL | 0 refills | Status: AC

## 2023-12-18 MED ORDER — LIDOCAINE 5 % EX PTCH: 1.0000 | MEDICATED_PATCH | CUTANEOUS | 0 refills | Status: AC

## 2023-12-18 MED ORDER — ACETAMINOPHEN 325 MG PO TABS
650.0000 mg | ORAL_TABLET | Freq: Once | ORAL | Status: AC
  Administered 2023-12-18: 650 mg via ORAL
  Filled 2023-12-18: qty 2

## 2023-12-18 MED ORDER — IOHEXOL 300 MG/ML  SOLN
125.0000 mL | Freq: Once | INTRAMUSCULAR | Status: AC | PRN
  Administered 2023-12-18: 125 mL via INTRAVENOUS
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
## 2023-12-18 NOTE — ED Notes (Signed)
 Attempted to obtain IV access 2x, unsuccessful, asked CN to attempt.
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
## 2023-12-27 ENCOUNTER — Other Ambulatory Visit: Payer: Self-pay | Admitting: Nurse Practitioner

## 2023-12-27 ENCOUNTER — Ambulatory Visit
Admission: RE | Admit: 2023-12-27 | Discharge: 2023-12-27 | Disposition: A | Discharge status: Home / Self Care | Source: Ambulatory Visit | Attending: Nurse Practitioner | Admitting: Nurse Practitioner

## 2023-12-27 DIAGNOSIS — R0781 Pleurodynia: Secondary | ICD-10-CM

## 2024-01-12 ENCOUNTER — Ambulatory Visit

## 2024-01-19 ENCOUNTER — Ambulatory Visit
Admission: EM | Admit: 2024-01-19 | Discharge: 2024-01-19 | Disposition: A | Discharge status: Home / Self Care | Attending: Family Medicine | Admitting: Family Medicine

## 2024-01-19 ENCOUNTER — Other Ambulatory Visit: Payer: Self-pay

## 2024-01-19 DIAGNOSIS — R051 Acute cough: Secondary | ICD-10-CM

## 2024-01-19 DIAGNOSIS — B349 Viral infection, unspecified: Secondary | ICD-10-CM

## 2024-01-19 LAB — POC SOFIA SARS ANTIGEN FIA: SARS Coronavirus 2 Ag: NEGATIVE

## 2024-01-19 NOTE — Discharge Instructions (Addendum)

## 2024-01-19 NOTE — ED Provider Notes (Signed)
 [facility]    CSN: [id number] Arrival date & time: 01/19/24  1901      History   Chief Complaint No chief complaint on file.   HPI Stacie Daugherty is a 63 y.o. female  presents for evaluation of URI symptoms for 3 days. Patient reports associated symptoms of cough and runny nose with diarrhea. Denies N/V/D, fevers, sore throat, ear pain, body aches, shortness of breath. Patient does not have a hx of asthma. Patient is not an active smoker.   Reports no known sick contacts.  Pt has taken allergy medicine OTC for symptoms.  She wants to make sure she does not have COVID.  Pt has no other concerns at this time.   HPI  Past Medical History:  Diagnosis Date   Acid reflux    Allergy    mild   Anxiety    bipolar    Arthritis    both knees   Bipolar 1 disorder (HCC)    Hospitalized multiple times for bipolar disorder ([place] - [facility], [facility], and [facility], most recently in [facility])   Depression    bipolar   Edema of both lower extremities    Hidradenitis suppurativa    Hyperlipidemia    Hypertension    Joint pain    Lactose intolerance    Obesity    Osteoarthritis    SOB (shortness of breath)    Vitamin B 12 deficiency    Vitamin D  deficiency     Patient Active Problem List   Diagnosis Date Noted   Proteinuria 07/10/2019   Unilateral primary osteoarthritis, left knee 09/13/2016   Encounter for gynecological examination without abnormal finding 07/27/2016   Sprain of medial collateral ligament of left knee 06/07/2016   Chronic pain of left knee 06/02/2016   Bipolar disorder, curr episode mixed, severe, with psychotic features (HCC) 08/26/2015   History of posttraumatic stress disorder (PTSD) 08/26/2015   Essential hypertension 08/26/2015   History of eczema 04/08/2015   Pedal edema 08/06/2014   Positive ANA (antinuclear antibody) 12/19/2012   Biological false positive RPR test 08/18/2012   HYPERCHOLESTEROLEMIA  04/28/2009   HIDRADENITIS SUPPURATIVA 09/06/2007   OBESITY 04/17/2007    Past Surgical History:  Procedure Laterality Date   BREAST BIOPSY Left 2012   benign   COLOSTOMY  07/06/2015   INDUCED ABORTION     in patient's 20s in [place] , [place].   WISDOM TOOTH EXTRACTION      OB History     Gravida  2   Para  1   Term  1   Preterm  0   AB  1   Living  1      SAB      IAB  1   Ectopic      Multiple      Live Births               Home Medications    Prior to Admission medications   Medication Sig Start Date End Date Taking? Authorizing Provider  acetaminophen  (TYLENOL ) 500 MG tablet Take 500 mg by mouth every 6 (six) hours as needed for mild pain (pain score 1-3).    [provider]  ARIPiprazole  (ABILIFY ) 15 MG tablet Take 15 mg by mouth at bedtime. 10/01/21   [provider]  atorvastatin  (LIPITOR) 40 MG tablet Take 40 mg by mouth daily. 09/18/22   [provider]  carbamazepine  (TEGRETOL ) 200 MG tablet Take 1 tablet (200  mg total) by mouth 2 (two) times daily at 8 am and 10 pm. 10/06/15   Pucilowska, Jolanta B, MD  carvedilol  (COREG ) 3.125 MG tablet Take 1 tablet (3.125 mg total) by mouth 2 (two) times daily. 09/25/23   Goodrich, Callie E, PA-C  Cholecalciferol (VITAMIN D3) 25 MCG (1000 UT) CAPS Take 1,000 Units by mouth daily.    [provider]  cyanocobalamin (VITAMIN B12) 500 MCG tablet Take 500 mcg by mouth daily.    [provider]  DENTA 5000 PLUS 1.1 % CREA dental cream Take by mouth. 10/13/20   [provider]  divalproex  (DEPAKOTE ) 500 MG DR tablet Take 500 mg by mouth 2 (two) times daily.    [provider]  ferrous sulfate 325 (65 FE) MG EC tablet Take 325 mg by mouth 3 (three) times daily with meals.    [provider]  FLUZONE QUADRIVALENT 0.5 ML injection  03/12/21   [provider]  gabapentin  (NEURONTIN ) 300 MG capsule Take 1 capsule (300 mg total) by mouth 3 (three)  times daily at 8am, 2pm and bedtime. For agitation 09/07/15   Collene Gouge I, NP  lidocaine  (LIDODERM ) 5 % Place 1 patch onto the skin daily. Remove & Discard patch within 12 hours or as directed by MD 12/18/23   Henderly, Britni A, PA-C  loratadine  (CLARITIN ) 10 MG tablet Take 10 mg by mouth daily. 02/17/22   [provider]  losartan -hydrochlorothiazide  (HYZAAR) 100-25 MG tablet Take 1 tablet by mouth daily.    [provider]  MELATONIN PO Take 1 tablet by mouth at bedtime.    [provider]  methocarbamol  (ROBAXIN ) 500 MG tablet Take 1 tablet (500 mg total) by mouth 2 (two) times daily. 12/18/23   Henderly, Britni A, PA-C  Multiple Vitamin (MULTIVITAMIN PO) Take by mouth.    [provider]  naproxen  (NAPROSYN ) 500 MG tablet Take 500 mg by mouth as needed for moderate pain, mild pain or headache. 09/06/22   [provider]  omeprazole (PRILOSEC) 40 MG capsule Take 40 mg by mouth daily. 07/07/22   [provider]  vitamin C (ASCORBIC ACID) 500 MG tablet Take 500 mg by mouth daily.    [provider]    Family History Family History  Problem Relation Age of Onset   Depression Mother    Pulmonary embolism Mother    Bipolar disorder Mother    Anxiety disorder Mother    Cancer Father        ?prostate cancer   Hypertension Maternal Grandfather        had colostomy bag- unsure reason    Colon cancer Maternal Grandfather    Hypertension Paternal Grandmother    Heart disease Other    Colon polyps Neg Hx    Stomach cancer Neg Hx    Rectal cancer Neg Hx    Breast cancer Neg Hx    Esophageal cancer Neg Hx     Social History Social History   Tobacco Use   Smoking status: Never    Passive exposure: Never   Smokeless tobacco: Never   Tobacco comments:    Smoked few cigarettes, socially, did not inhale.  Only tried cigarettes a few times, then stopped.  Vaping Use   Vaping status: Never Used  Substance Use Topics   Alcohol  use: Not Currently    Comment: Social alcohol use when younger   Drug use: No    Comment: Social marijuana use when younger  Allergies   Haldol [haloperidol lactate]   Review of Systems Review of Systems  HENT:  Positive for rhinorrhea.   Respiratory:  Positive for cough.   Gastrointestinal:  Positive for diarrhea.     Physical Exam Triage Vital Signs ED Triage Vitals  Encounter Vitals Group     BP 01/19/24 1930 (!) 170/95     Girls Systolic BP Percentile --      Girls Diastolic BP Percentile --      Boys Systolic BP Percentile --      Boys Diastolic BP Percentile --      Pulse Rate 01/19/24 1930 (!) 50     Resp 01/19/24 1930 17     Temp 01/19/24 1930 98.9 F (37.2 C)     Temp Source 01/19/24 1930 Oral     SpO2 01/19/24 1930 97 %     Weight --      Height --      Head Circumference --      Peak Flow --      Pain Score 01/19/24 1927 0     Pain Loc --      Pain Education --      Exclude from Growth Chart --    No data found.  Updated Vital Signs BP (!) 170/95   Pulse (!) 50   Temp 98.9 F (37.2 C) (Oral)   Resp 17   LMP 04/13/2016   SpO2 97%   Visual Acuity Right Eye Distance:   Left Eye Distance:   Bilateral Distance:    Right Eye Near:   Left Eye Near:    Bilateral Near:     Physical Exam Vitals and nursing note reviewed.  Constitutional:      General: She is not in acute distress.    Appearance: She is well-developed. She is not ill-appearing.  HENT:     Head: Normocephalic and atraumatic.     Right Ear: Tympanic membrane and ear canal normal.     Left Ear: Tympanic membrane and ear canal normal.     Nose: Rhinorrhea present. No congestion.     Mouth/Throat:     Mouth: Mucous membranes are moist.     Pharynx: Oropharynx is clear. Uvula midline. No posterior oropharyngeal erythema.     Tonsils: No tonsillar exudate or tonsillar abscesses.  Eyes:     Conjunctiva/sclera: Conjunctivae normal.     Pupils: Pupils are equal, round, and  reactive to light.  Cardiovascular:     Rate and Rhythm: Normal rate and regular rhythm.     Heart sounds: Normal heart sounds.  Pulmonary:     Effort: Pulmonary effort is normal.     Breath sounds: Normal breath sounds. No wheezing or rhonchi.  Musculoskeletal:     Cervical back: Normal range of motion and neck supple.  Lymphadenopathy:     Cervical: No cervical adenopathy.  Skin:    General: Skin is warm and dry.  Neurological:     General: No focal deficit present.     Mental Status: She is alert and oriented to person, place, and time.  Psychiatric:        Mood and Affect: Mood normal.        Behavior: Behavior normal.      UC Treatments / Results  Labs (all labs ordered are listed, but only abnormal results are displayed) Labs Reviewed  POC SOFIA SARS ANTIGEN FIA    EKG   Radiology No results found.  Procedures Procedures (including critical  care time)  Medications Ordered in UC Medications - No data to display  Initial Impression / Assessment and Plan / UC Course  I have reviewed the triage vital signs and the nursing notes.  Pertinent labs & imaging results that were available during my care of the patient were reviewed by me and considered in my medical decision making (see chart for details).  Clinical Course as of 01/19/24 1954  Fri Jan 19, 2024  1953 Heart rate recheck 62 [JM]    Clinical Course User Index [JM] Loreda Myla SAUNDERS, NP    Reviewed and symptoms with patient.  No red flags.  She is well-appearing and in no acute distress.  Negative COVID testing.  Discussed viral illness and symptomatic treatment.  Advise rest fluids and PCP follow-up if symptoms do not improve.  ER precautions reviewed. Final Clinical Impressions(s) / UC Diagnoses   Final diagnoses:  Acute cough  Viral illness     Discharge Instructions      Please treat your symptoms with over the counter cough medication, tylenol  or ibuprofen , humidifier, and rest. Viral  illnesses can last 7-14 days. Please follow up with your PCP if your symptoms are not improving. Please go to the ER for any worsening symptoms. This includes but is not limited to fever you can not control with tylenol  or ibuprofen , you are not able to stay hydrated, you have shortness of breath or chest pain.  Thank you for choosing [facility] for your healthcare needs. I hope you feel better soon!      ED Prescriptions   None    PDMP not reviewed this encounter.   Loreda Myla SAUNDERS, NP 01/19/24 [phone]

## 2024-01-19 NOTE — ED Triage Notes (Signed)
 Pt c/o dry cough, nasal drainage, diarrheax3d.

## 2024-02-19 ENCOUNTER — Ambulatory Visit

## 2024-03-06 ENCOUNTER — Other Ambulatory Visit: Payer: Self-pay | Admitting: Nurse Practitioner

## 2024-03-06 DIAGNOSIS — S2001XA Contusion of right breast, initial encounter: Secondary | ICD-10-CM

## 2024-03-12 ENCOUNTER — Ambulatory Visit: Admitting: Physical Therapy

## 2024-03-14 ENCOUNTER — Other Ambulatory Visit: Payer: Self-pay | Admitting: Nurse Practitioner

## 2024-03-14 ENCOUNTER — Ambulatory Visit
Admission: RE | Admit: 2024-03-14 | Discharge: 2024-03-14 | Disposition: A | Discharge status: Home / Self Care | Source: Ambulatory Visit | Attending: Nurse Practitioner | Admitting: Nurse Practitioner

## 2024-03-14 DIAGNOSIS — R928 Other abnormal and inconclusive findings on diagnostic imaging of breast: Secondary | ICD-10-CM

## 2024-03-14 DIAGNOSIS — S2001XA Contusion of right breast, initial encounter: Secondary | ICD-10-CM

## 2024-03-22 ENCOUNTER — Ambulatory Visit
Admission: EM | Admit: 2024-03-22 | Discharge: 2024-03-22 | Disposition: A | Discharge status: Home / Self Care | Attending: Family Medicine | Admitting: Family Medicine

## 2024-03-22 DIAGNOSIS — S80822A Blister (nonthermal), left lower leg, initial encounter: Secondary | ICD-10-CM

## 2024-03-22 MED ORDER — BACITRACIN ZINC 500 UNIT/GM EX OINT: 1.0000 | TOPICAL_OINTMENT | Freq: Three times a day (TID) | CUTANEOUS | 0 refills | Status: AC

## 2024-03-22 NOTE — ED Provider Notes (Signed)
 [facility]  Note:  This document was prepared using Conservation officer, historic buildings and may include unintentional dictation errors.  MRN: [id number] DOB: 06/10/1960   Subjective:   Stacie Daugherty is a 63 y.o. female presenting for 3-4 day history of persistent blister to the back of her left leg.  No pain, drainage of pus or bleeding, fever.  No known inciting event.  Patient reports that she could have suffered a bug bite.  Walks her dog outdoors but cannot recall any particular incident.  She also believes she may have scrubbed too much while showering.  Reports that she felt the area was bumpy and then scrubbed.  No history of dermatologic conditions.  Spoke with her friend who is a Publishing rights manager and recommended incision and drainage, patient is requesting this.  No current facility-administered medications for this encounter.  Current Outpatient Medications:    acetaminophen  (TYLENOL ) 500 MG tablet, Take 500 mg by mouth every 6 (six) hours as needed for mild pain (pain score 1-3)., Disp: , Rfl:    ARIPiprazole  (ABILIFY ) 15 MG tablet, Take 15 mg by mouth at bedtime., Disp: , Rfl:    atorvastatin  (LIPITOR) 40 MG tablet, Take 40 mg by mouth daily., Disp: , Rfl:    carbamazepine  (TEGRETOL ) 200 MG tablet, Take 1 tablet (200 mg total) by mouth 2 (two) times daily at 8 am and 10 pm., Disp: 60 tablet, Rfl: 0   carvedilol  (COREG ) 3.125 MG tablet, Take 1 tablet (3.125 mg total) by mouth 2 (two) times daily., Disp: 60 tablet, Rfl: 12   Cholecalciferol (VITAMIN D3) 25 MCG (1000 UT) CAPS, Take 1,000 Units by mouth daily., Disp: , Rfl:    cyanocobalamin (VITAMIN B12) 500 MCG tablet, Take 500 mcg by mouth daily., Disp: , Rfl:    DENTA 5000 PLUS 1.1 % CREA dental cream, Take by mouth., Disp: , Rfl:    divalproex  (DEPAKOTE ) 500 MG DR tablet, Take 500 mg by mouth 2 (two) times daily., Disp: , Rfl:    ferrous sulfate 325 (65 FE) MG EC tablet, Take 325 mg by mouth 3  (three) times daily with meals., Disp: , Rfl:    FLUZONE QUADRIVALENT 0.5 ML injection, , Disp: , Rfl:    gabapentin  (NEURONTIN ) 300 MG capsule, Take 1 capsule (300 mg total) by mouth 3 (three) times daily at 8am, 2pm and bedtime. For agitation, Disp: 90 capsule, Rfl: 0   lidocaine  (LIDODERM ) 5 %, Place 1 patch onto the skin daily. Remove & Discard patch within 12 hours or as directed by MD, Disp: 30 patch, Rfl: 0   loratadine  (CLARITIN ) 10 MG tablet, Take 10 mg by mouth daily., Disp: , Rfl:    losartan -hydrochlorothiazide  (HYZAAR) 100-25 MG tablet, Take 1 tablet by mouth daily., Disp: , Rfl:    MELATONIN PO, Take 1 tablet by mouth at bedtime., Disp: , Rfl:    methocarbamol  (ROBAXIN ) 500 MG tablet, Take 1 tablet (500 mg total) by mouth 2 (two) times daily., Disp: 20 tablet, Rfl: 0   Multiple Vitamin (MULTIVITAMIN PO), Take by mouth., Disp: , Rfl:    naproxen  (NAPROSYN ) 500 MG tablet, Take 500 mg by mouth as needed for moderate pain, mild pain or headache., Disp: , Rfl:    omeprazole (PRILOSEC) 40 MG capsule, Take 40 mg by mouth daily., Disp: , Rfl:    vitamin C (ASCORBIC ACID) 500 MG tablet, Take 500 mg by mouth daily., Disp: , Rfl:    Allergies  Allergen Reactions  Haldol [Haloperidol Lactate] Other (See Comments)    Pt  Just doesn't like because she cant remember anything the next day.     Past Medical History:  Diagnosis Date   Acid reflux    Allergy    mild   Anxiety    bipolar    Arthritis    both knees   Bipolar 1 disorder (HCC)    Hospitalized multiple times for bipolar disorder ([facility] - [facility], [facility], and [facility], most recently in [place])   Depression    bipolar   Edema of both lower extremities    Hidradenitis suppurativa    Hyperlipidemia    Hypertension    Joint pain    Lactose intolerance    Obesity    Osteoarthritis    SOB (shortness of breath)    Vitamin B 12 deficiency    Vitamin D  deficiency      Past Surgical History:   Procedure Laterality Date   BREAST BIOPSY Left 2012   benign   COLOSTOMY  07/06/2015   INDUCED ABORTION     in patient's 20s in [place] , [place].   WISDOM TOOTH EXTRACTION      Family History  Problem Relation Age of Onset   Depression Mother    Pulmonary embolism Mother    Bipolar disorder Mother    Anxiety disorder Mother    Cancer Father        ?prostate cancer   Hypertension Maternal Grandfather        had colostomy bag- unsure reason    Colon cancer Maternal Grandfather    Hypertension Paternal Grandmother    Heart disease Other    Colon polyps Neg Hx    Stomach cancer Neg Hx    Rectal cancer Neg Hx    Breast cancer Neg Hx    Esophageal cancer Neg Hx     Social History   Tobacco Use   Smoking status: Never    Passive exposure: Never   Smokeless tobacco: Never   Tobacco comments:    Smoked few cigarettes, socially, did not inhale.  Only tried cigarettes a few times, then stopped.  Vaping Use   Vaping status: Never Used  Substance Use Topics   Alcohol use: Not Currently   Drug use: No    ROS   Objective:   Vitals: BP (!) 149/84 (BP Location: Left Arm)   Pulse 67   Temp 98.2 F (36.8 C) (Oral)   Resp 20   LMP 04/13/2016   SpO2 92%   Physical Exam Constitutional:      General: She is not in acute distress.    Appearance: Normal appearance. She is well-developed. She is not ill-appearing, toxic-appearing or diaphoretic.  HENT:     Head: Normocephalic and atraumatic.     Nose: Nose normal.     Mouth/Throat:     Mouth: Mucous membranes are moist.  Eyes:     General: No scleral icterus.       Right eye: No discharge.        Left eye: No discharge.     Extraocular Movements: Extraocular movements intact.  Cardiovascular:     Rate and Rhythm: Normal rate.  Pulmonary:     Effort: Pulmonary effort is normal.  Skin:    General: Skin is warm and dry.      Neurological:     General: No focal deficit present.     Mental Status: She is alert and  oriented to  person, place, and time.  Psychiatric:        Mood and Affect: Mood normal.        Behavior: Behavior normal.      Patient requested incision and drainage.  No local anesthesia was administered.  An 11 blade was used to lance the wound inferiorly ~1/4cm. ~3cc of sanguinous fluid was expressed.  Wound cleansed and dressed with bacitracin, cover with nonadherent and secured with Coban.  Patient tolerated this very well.  Assessment and Plan :   PDMP not reviewed this encounter.  1. Blister of left lower extremity, initial encounter    Incision and drainage as above.  Recommended general wound care.  Will defer antibiotic use as there is no sign of infection at this stage.  Counseled patient on potential for adverse effects with medications prescribed/recommended today, ER and return-to-clinic precautions discussed, patient verbalized understanding.    Christopher Savannah, PA-C 03/22/24 1352

## 2024-03-22 NOTE — ED Triage Notes (Signed)
 Pt c/o blister to back of left leg x 3-4 days-NAD-slow steady gait

## 2024-03-22 NOTE — Discharge Instructions (Addendum)
 Change your dressing every 4-6 hours daily. Every time you change your dressing, clean the wound gently with warm water  and Dial antibacterial soap. Pat the wound dry, let it breathe for roughly an hour before covering it back up. When you reapply a dressing, apply Bacitracin ointment to the wound, then cover with non-stick/non-adherent gauze and secure with Coban.

## 2024-03-27 ENCOUNTER — Other Ambulatory Visit: Payer: Self-pay

## 2024-03-27 ENCOUNTER — Ambulatory Visit: Payer: Self-pay | Attending: Nurse Practitioner

## 2024-03-27 DIAGNOSIS — M6281 Muscle weakness (generalized): Secondary | ICD-10-CM | POA: Insufficient documentation

## 2024-03-27 DIAGNOSIS — G8929 Other chronic pain: Secondary | ICD-10-CM | POA: Diagnosis present

## 2024-03-27 DIAGNOSIS — R2689 Other abnormalities of gait and mobility: Secondary | ICD-10-CM | POA: Diagnosis present

## 2024-03-27 DIAGNOSIS — M25561 Pain in right knee: Secondary | ICD-10-CM | POA: Insufficient documentation

## 2024-03-27 NOTE — Therapy (Signed)
 OUTPATIENT PHYSICAL THERAPY LOWER EXTREMITY EVALUATION   Patient Name: Stacie Daugherty MRN: [id number] DOB:16-Oct-1960, 63 y.o., female Today's Date: 03/28/2024  END OF SESSION:  PT End of Session - 03/28/24 0845     Visit Number 1    Number of Visits 17    Date for Recertification  05/23/24    Authorization Type [employer] Dual Complete    PT Start Time 1015    PT Stop Time 1057    PT Time Calculation (min) 42 min    Activity Tolerance Patient tolerated treatment well    Behavior During Therapy WFL for tasks assessed/performed          Past Medical History:  Diagnosis Date   Acid reflux    Allergy    mild   Anxiety    bipolar    Arthritis    both knees   Bipolar 1 disorder (HCC)    Hospitalized multiple times for bipolar disorder ([place] - [facility], [facility], and [facility], most recently in [place])   Depression    bipolar   Edema of both lower extremities    Hidradenitis suppurativa    Hyperlipidemia    Hypertension    Joint pain    Lactose intolerance    Obesity    Osteoarthritis    SOB (shortness of breath)    Vitamin B 12 deficiency    Vitamin D  deficiency    Past Surgical History:  Procedure Laterality Date   BREAST BIOPSY Left 2012   benign   COLOSTOMY  07/06/2015   INDUCED ABORTION     in patient's 20s in [place] , [place].   WISDOM TOOTH EXTRACTION     Patient Active Problem List   Diagnosis Date Noted   Proteinuria 07/10/2019   Unilateral primary osteoarthritis, left knee 09/13/2016   Encounter for gynecological examination without abnormal finding 07/27/2016   Sprain of medial collateral ligament of left knee 06/07/2016   Chronic pain of left knee 06/02/2016   Bipolar disorder, curr episode mixed, severe, with psychotic features (HCC) 08/26/2015   History of posttraumatic stress disorder (PTSD) 08/26/2015   Essential hypertension 08/26/2015   History of eczema 04/08/2015   Pedal edema 08/06/2014   Positive  ANA (antinuclear antibody) 12/19/2012   Biological false positive RPR test 08/18/2012   HYPERCHOLESTEROLEMIA 04/28/2009   HIDRADENITIS SUPPURATIVA 09/06/2007   OBESITY 04/17/2007    PCP:  Delores Rojelio Caldron, NP  REFERRING PROVIDER: Delores Rojelio Caldron, NP  REFERRING DIAG: M19.90 (ICD-10-CM) - Osteoarthritis   THERAPY DIAG:  Chronic pain of right knee  Muscle weakness (generalized)  Other abnormalities of gait and mobility  Rationale for Evaluation and Treatment: Rehabilitation  ONSET DATE: Chronic May 2025  SUBJECTIVE:   SUBJECTIVE STATEMENT: Pt presents to PT with reports of R knee pain after twisting injury in May 2025. Was going and working out at [facility] but this has become more uncomfortable. Was going to [facility] for therapy but wanted to switch to this clinic. Had one instance of buckling in R knee but overall feels stable. Denies clicking, popping, or clunking in L knee. Wants to get back to working out at [facility] with decreased pain.   PERTINENT HISTORY: HTN, Bipolar disorder  PAIN:  Are you having pain?  Yes: NPRS scale: 5/10 Worst: 8/10 Pain location: R knee  Pain description: sharp, sore Aggravating factors: stairs, transfers Relieving factors: rest , heat  PRECAUTIONS: None  RED FLAGS: None   WEIGHT BEARING RESTRICTIONS: No  FALLS:  Has patient fallen in last 6 months? No  LIVING ENVIRONMENT: Lives with: lives alone Lives in: House/apartment Stairs: Yes: External: 2 steps; bilateral but cannot reach both Has following equipment at home: None  OCCUPATION: Not currently working   PLOF: Independent  PATIENT GOALS: decrease knee pain, improve comfort with walking, get back to working out  NEXT MD VISIT: PRN  OBJECTIVE:  Note: Objective measures were completed at Evaluation unless otherwise noted.  DIAGNOSTIC FINDINGS: N/A  PATIENT SURVEYS:   Extreme difficulty/unable (0), Quite a bit of difficulty (1), Moderate difficulty (2), Little  difficulty (3), No difficulty (4) Survey date:  03/27/2024  Any of your usual work, housework or school activities 0  2. Usual hobbies, recreational or sporting activities 2  3. Getting into/out of the bath 0  4. Walking between rooms 2  5. Putting on socks/shoes 1  6. Squatting  1  7. Lifting an object, like a bag of groceries from the floor 0  8. Performing light activities around your home 1  9. Performing heavy activities around your home 0  10. Getting into/out of a car 1  11. Walking 2 blocks 0  12. Walking 1 mile 0  13. Going up/down 10 stairs (1 flight) 0  14. Standing for 1 hour 0  15.  sitting for 1 hour 1  16. Running on even ground 0  17. Running on uneven ground 0  18. Making sharp turns while running fast 0  19. Hopping  0  20. Rolling over in bed 1  Score total:  10/80   COGNITION: Overall cognitive status: Within functional limits for tasks assessed     SENSATION: WFL  POSTURE: rounded shoulders, forward head, and genu varum and larger body habitus  PALPATION: Slight TTP to distal R medial quad  LOWER EXTREMITY ROM:  Active ROM Right eval Left eval  Knee flexion 110 116  Knee extension 5 0   (Blank rows = not tested)  LOWER EXTREMITY MMT:  MMT Right eval Left eval  Hip flexion 4 4  Hip extension    Hip abduction 3+ 3+  Hip adduction    Hip internal rotation    Hip external rotation    Knee flexion 4 4  Knee extension 4 5  Ankle dorsiflexion    Ankle plantarflexion    Ankle inversion    Ankle eversion     (Blank rows = not tested)  LOWER EXTREMITY SPECIAL TESTS:  Knee special tests: Anterior drawer test: negative and Posterior drawer test: negative  FUNCTIONAL TESTS:  30 Second Sit to Stand: 13 reps  GAIT: Distance walked: 40ft Assistive device utilized: None Level of assistance: Complete Independence Comments: decreased stance time on R, decreased R knee ext    TREATMENT: [facility] Adult PT Treatment:                                                 DATE: 03/27/2024 Therapeutic Exercise: Supine QS x 5 - 5 hold SLR x 5 R Hooklying clamshell x 10 blue band LAQ x 10  Single knee ext x 10 5# R only  PATIENT EDUCATION:  Education details: eval findings, PSFS, HEP, POC Person educated: Patient Education method: Explanation, Demonstration, and Handouts Education comprehension: verbalized understanding and returned demonstration  HOME EXERCISE PROGRAM: Access Code: [email or web address] URL: [email or web address] Date: 03/27/2024 Prepared by: Alm Kingdom  Exercises - Supine Quadricep Sets  - 1 x daily - 7 x weekly - 2 sets - 10 reps - 5 sec hold - Active Straight Leg Raise with Quad Set  - 1 x daily - 7 x weekly - 2-3 sets - 10 reps - Hooklying Clamshell with Resistance  - 1 x daily - 7 x weekly - 3 sets - 15 reps - blue band hold - Seated Long Arc Quad  - 1 x daily - 7 x weekly - 2 sets - 10 reps - 5 sec hold - Single Leg Knee Extension with Weight Machine  - 1 x daily - 7 x weekly - 2-3 sets - 10 reps - 5-10lbs hold  ASSESSMENT:  CLINICAL IMPRESSION: Patient is a 63 y.o. F who was seen today for physical therapy evaluation and treatment for acute on chronic R knee pain. Physical findings are consistent with referring provider impression as pt demonstrates decrease in LE strength and general functional mobility. LEFS score shows severe disability in performance of home ADLs and higher level community activities. Pt would benefit from skilled PT services working on improving quad and hip strength in order to decrease knee pain and improve gait/mobility.   OBJECTIVE IMPAIRMENTS: Abnormal gait, decreased activity tolerance, decreased mobility, difficulty walking, decreased ROM, decreased strength, and pain  ACTIVITY LIMITATIONS: carrying, lifting, standing, squatting, sleeping, stairs, transfers, and locomotion level  PARTICIPATION LIMITATIONS: meal prep, cleaning, driving, shopping, community activity, and yard  work  PERSONAL FACTORS: Time since onset of injury/illness/exacerbation and 1-2 comorbidities: HTN, Bipolar disorder are also affecting patient's functional outcome.   REHAB POTENTIAL: Good  CLINICAL DECISION MAKING: Stable/uncomplicated  EVALUATION COMPLEXITY: Low   GOALS: Goals reviewed with patient? No  SHORT TERM GOALS: Target date: 04/17/2024   Pt will be compliant and knowledgeable with initial HEP for improved comfort and carryover Baseline: initial HEP given  Goal status: INITIAL  2.  Pt will self report right knee pain no greater than 6/10 for improved comfort and functional ability Baseline: 8/10 at worst Goal status: INITIAL   LONG TERM GOALS: Target date: 05/23/2024   Pt will improve LEFS to no less than 25/80 as proxy for functional improvement with home ADLs and higher level community activity Baseline: 10/80 Goal status: INITIAL  2.  Pt will self report right knee pain no greater than 4/10 for improved comfort and functional ability Baseline: 8/10 at worst Goal status: INITIAL   3.  Pt will increase 30 Second Sit to Stand rep count to no less than 15 reps for improved balance, strength, and functional mobility Baseline: 13 reps  Goal status: INITIAL   4.  Pt will improve R knee AROM to no less than 0-115 degrees for improved comfort and functional mobility Baseline: see chart Goal status: INITIAL  5.  Pt will be compliant and knowledgeable with final HEP for improved comfort and carryover post discharge Baseline: initial HEP given  Goal status: INITIAL   PLAN:  PT FREQUENCY: 1-2x/week  PT DURATION: 8 weeks  PLANNED INTERVENTIONS: [id number]- PT Re-evaluation, 97110-Therapeutic exercises, 97530- Therapeutic activity, [id number]- Neuromuscular re-education, 97535- Self Care, [id number]- Manual therapy, [id number]- Gait training, [phone]- Electrical stimulation (unattended), [id number]- Electrical stimulation (manual), 97016- Vasopneumatic device, 20560 (1-2 muscles), 20561 (3+  muscles)- Dry Needling, and Patient/Family education  PLAN FOR NEXT SESSION: assess HEP response, LE strengthening, gait training   Alm JAYSON Kingdom, PT 03/28/2024, 8:51 AM

## 2024-04-08 ENCOUNTER — Ambulatory Visit

## 2024-04-10 ENCOUNTER — Ambulatory Visit

## 2024-04-12 ENCOUNTER — Ambulatory Visit

## 2024-04-15 ENCOUNTER — Ambulatory Visit

## 2024-04-24 ENCOUNTER — Ambulatory Visit

## 2024-07-04 ENCOUNTER — Telehealth: Payer: Self-pay | Admitting: Cardiology

## 2024-07-04 NOTE — Telephone Encounter (Signed)
 Spoke with patient who stated her PCP wants to add Norvasc  5 mg daily  Read can cause swelling and already has issues with swelling in her ankles and feet  Concerned swelling will get worse  She has appointment with Vascular in April  Does not check her blood pressure at home, no large cuff yet Dose not have HR from last visit but recent HR at another visit 66  Will forward to Dr Lonni for review

## 2024-07-04 NOTE — Telephone Encounter (Signed)
 Patient states that her dr wants to switch her to a new bp medication. Would like to speak with Dr. lonni about it. States her bp was 161/82. Please advise

## 2024-07-11 ENCOUNTER — Ambulatory Visit

## 2024-08-01 ENCOUNTER — Ambulatory Visit

## 2024-09-13 ENCOUNTER — Encounter

## 2024-09-13 ENCOUNTER — Other Ambulatory Visit

## 2024-09-19 ENCOUNTER — Encounter

## 2024-09-19 ENCOUNTER — Ambulatory Visit (HOSPITAL_COMMUNITY)
